# Patient Record
Sex: Female | Born: 1935 | Race: White | Hispanic: No | Smoking: Never smoker
Health system: Southern US, Community
[De-identification: ages and names within clinical notes are randomized; demographics above are authoritative.]

## PROBLEM LIST (undated history)

## (undated) ENCOUNTER — Emergency Department (HOSPITAL_COMMUNITY): Payer: Medicare Other

## (undated) DIAGNOSIS — R32 Unspecified urinary incontinence: Secondary | ICD-10-CM

## (undated) DIAGNOSIS — I639 Cerebral infarction, unspecified: Secondary | ICD-10-CM

## (undated) DIAGNOSIS — E039 Hypothyroidism, unspecified: Secondary | ICD-10-CM

## (undated) DIAGNOSIS — E785 Hyperlipidemia, unspecified: Secondary | ICD-10-CM

## (undated) HISTORY — PX: SKIN CANCER EXCISION: SHX779

## (undated) HISTORY — PX: ABDOMINAL HYSTERECTOMY: SHX81

## (undated) HISTORY — PX: BREAST BIOPSY: SHX20

## (undated) HISTORY — PX: TOTAL ABDOMINAL HYSTERECTOMY W/ BILATERAL SALPINGOOPHORECTOMY: SHX83

## (undated) HISTORY — PX: COLONOSCOPY: SHX174

## (undated) HISTORY — DX: Cerebral infarction, unspecified: I63.9

## (undated) HISTORY — PX: EYE SURGERY: SHX253

## (undated) HISTORY — PX: DILATION AND CURETTAGE OF UTERUS: SHX78

---

## 1962-11-29 HISTORY — PX: CHOLECYSTECTOMY: SHX55

## 2008-01-09 LAB — HM DEXA SCAN

## 2010-02-02 LAB — HM MAMMOGRAPHY

## 2010-07-31 ENCOUNTER — Ambulatory Visit: Payer: Self-pay | Admitting: Ophthalmology

## 2010-08-12 ENCOUNTER — Ambulatory Visit: Payer: Self-pay | Admitting: General Surgery

## 2010-12-15 ENCOUNTER — Ambulatory Visit: Payer: Self-pay | Admitting: Ophthalmology

## 2010-12-22 ENCOUNTER — Ambulatory Visit: Payer: Self-pay | Admitting: Ophthalmology

## 2011-01-19 ENCOUNTER — Ambulatory Visit: Payer: Self-pay | Admitting: Ophthalmology

## 2013-04-09 ENCOUNTER — Ambulatory Visit: Payer: Self-pay

## 2013-06-25 ENCOUNTER — Ambulatory Visit: Payer: Self-pay | Admitting: Neurosurgery

## 2015-03-14 ENCOUNTER — Other Ambulatory Visit: Payer: Self-pay | Admitting: Unknown Physician Specialty

## 2015-03-14 DIAGNOSIS — Z1231 Encounter for screening mammogram for malignant neoplasm of breast: Secondary | ICD-10-CM

## 2015-06-09 ENCOUNTER — Ambulatory Visit
Admission: RE | Admit: 2015-06-09 | Discharge: 2015-06-09 | Disposition: A | Discharge status: Home / Self Care | Payer: Medicare Other | Source: Ambulatory Visit | Attending: Unknown Physician Specialty | Admitting: Unknown Physician Specialty

## 2015-06-09 ENCOUNTER — Other Ambulatory Visit: Payer: Self-pay | Admitting: Unknown Physician Specialty

## 2015-06-09 DIAGNOSIS — R921 Mammographic calcification found on diagnostic imaging of breast: Secondary | ICD-10-CM | POA: Insufficient documentation

## 2015-06-09 DIAGNOSIS — Z1231 Encounter for screening mammogram for malignant neoplasm of breast: Secondary | ICD-10-CM

## 2015-06-09 DIAGNOSIS — R928 Other abnormal and inconclusive findings on diagnostic imaging of breast: Secondary | ICD-10-CM

## 2015-06-10 ENCOUNTER — Telehealth: Payer: Self-pay

## 2015-06-10 NOTE — Telephone Encounter (Signed)
Called and let patient know about additional imaging. I informed her that they would be calling her or that she could call them and make her appointment.

## 2015-06-10 NOTE — Telephone Encounter (Signed)
Called and asked patient if she was or had already gone for her repeat imaging. Patient stated that the girl did the first scan yesterday, had the patient wait, and then did the scans again. Patient stated that the girl told her everything was fine. She does not understand why she needs a repeat because she thinks the repeat was yesterday when she did the scans the second time. Patient stated if she needs to have something done she will, but just does not understand.

## 2015-06-10 NOTE — Telephone Encounter (Signed)
-----   Message from Valerie Roys, DO sent at 06/09/2015  4:22 PM EDT ----- Incomplete- can we please make sure she is going for her repeat imaging. It looks like it has already been ordered, just want to make sure it's scheduled.

## 2015-06-10 NOTE — Discharge Instructions (Signed)
Breast Self-Awareness  Breast self-awareness allows you to notice a breast problem early while it is still small. Do a breast self-exam:  · Every month, 5-7 days after your period (menstrual period).  · At the same time each month if you do not have periods anymore.  Look for any:  · Difference between your breasts (size, shape, or position).  · Change in breast shape or size.  · Fluid or blood coming from your nipples.  · Changes in your nipples (dimpling, nipple movement).  ·  Change in skin color or texture (redness, scaly areas).  Feel for:  · Lumps.  · Bumps.  · Dips.  · Any other changes.  HOW TO DO A BREAST SELF-EXAM  Look at your breasts and nipples.  1. Take off all your clothes above your waist.  2. Stand in front of a mirror in a room with good lighting.  3. Put your hands on your hips and push your hands downward.  Feel your breasts.   1. Lie flat on your back or stand in the shower or tub. If you are in the shower or tub, have wet, soapy hands.  2. Place your right arm above your head.  3. Place your left hand in the right underarm area.  4. Make small circles using the pads (not the fingertips) of your 3 middle fingers. Press lightly and then with medium and firm pressure.  5. Move your fingers a little lower and make the small circles at the 3 pressures (light, medium, and firm).  6. Continue moving your fingers lower and making circles until you reach the bottom of your breast.  7. Move your fingers one finger-width towards the center of the body.  8. Continue making the circles, this time moving upward until you reach the bottom of your neck.  9. Move your fingers one finger-width towards the center of your body.  10. Make circles downward when starting at the bottom of the neck. Make circles upward when starting at the bottom of the breast. Stop when you reach the middle of the chest.  11.  Repeat these steps on the other breast.  Write down what looks and feels normal for each breast. Also write  down any changes you notice.  GET HELP RIGHT AWAY IF:  · You see any changes in your breasts or nipples.  · You see skin changes.  · You have unusual discharge from your nipples.  · You feel a new lump.  · You feel unusually thick areas.  Document Released: 05/03/2008 Document Revised: 11/01/2012 Document Reviewed: 03/01/2012  [employer]® Patient Information ©2015 [employer]. This information is not intended to replace advice given to you by your health care provider. Make sure you discuss any questions you have with your health care provider.

## 2015-06-10 NOTE — Telephone Encounter (Signed)
It looks like they saw some calcifications in her R breast and needed to get additional imaging. They should be contacting her, otherwise she can call them and get an appointment.

## 2015-06-11 ENCOUNTER — Ambulatory Visit
Admission: RE | Admit: 2015-06-11 | Discharge: 2015-06-11 | Disposition: A | Discharge status: Home / Self Care | Payer: Medicare Other | Source: Ambulatory Visit | Attending: Unknown Physician Specialty | Admitting: Unknown Physician Specialty

## 2015-06-11 ENCOUNTER — Other Ambulatory Visit: Payer: Self-pay | Admitting: Unknown Physician Specialty

## 2015-06-11 ENCOUNTER — Encounter: Payer: Self-pay | Admitting: *Deleted

## 2015-06-11 ENCOUNTER — Telehealth: Payer: Self-pay | Admitting: Family Medicine

## 2015-06-11 ENCOUNTER — Ambulatory Visit: Payer: Medicare Other

## 2015-06-11 DIAGNOSIS — R928 Other abnormal and inconclusive findings on diagnostic imaging of breast: Secondary | ICD-10-CM | POA: Diagnosis present

## 2015-06-11 DIAGNOSIS — R921 Mammographic calcification found on diagnostic imaging of breast: Secondary | ICD-10-CM

## 2015-06-11 NOTE — Telephone Encounter (Signed)
Called to notify patient of her mammogram results. Left a voicemail and told her to return my call.  Appointment scheduled for evaluation  at [facility] for 06-17-15@ 11:15 arrive at 11:00 with Dr.Byrnette

## 2015-06-11 NOTE — Telephone Encounter (Signed)
Patient notified of her appointment.

## 2015-06-11 NOTE — Telephone Encounter (Signed)
Please let patient know that her mammogram looked suspicious. Will refer to Dr. Bary Castilla (please set up prior to calling patient) for evaluation and possible biopsy. Thanks!

## 2015-06-16 ENCOUNTER — Encounter: Payer: Self-pay | Admitting: General Surgery

## 2015-06-17 ENCOUNTER — Ambulatory Visit (INDEPENDENT_AMBULATORY_CARE_PROVIDER_SITE_OTHER): Payer: Medicare Other | Admitting: General Surgery

## 2015-06-17 ENCOUNTER — Encounter: Payer: Self-pay | Admitting: General Surgery

## 2015-06-17 VITALS — BP 140/82 | HR 70 | Resp 12 | Ht 60.0 in | Wt 198.0 lb

## 2015-06-17 DIAGNOSIS — R920 Mammographic microcalcification found on diagnostic imaging of breast: Secondary | ICD-10-CM

## 2015-06-17 NOTE — Progress Notes (Signed)
Patient ID: Sarah Freeman, female   DOB: June 26, 1931, 81 y.o.   MRN: [id number]  Chief Complaint  Patient presents with  . Follow-up    mammogram    HPI Sarah Freeman is a 81 y.o. female who presents for a breast evaluation. The most recent mammogram was done on 06/09/15 and added right breast views done on 06/11/15.  Patient does perform regular self breast checks but does not get regular mammograms done.  She states she had this mammogram done because she is still on premarin. She has not felt anything different in the breast. She was here in 2011 when she had a benign left breast biopsy. He found the stereotactic procedure somewhat traumatic, so much so that she's declined to have follow-up mammograms until recently. She agreed to this only because she has been making use of Premarin for an extended period of time for management of fairly significant vasomotor symptoms.  Her husband passed in 2012, just shy of their 59th anniversary.  HPI  No past medical history on file.  Past Surgical History  Procedure Laterality Date  . Cholecystectomy    . Dilation and curettage of uterus    . Eye surgery    . Breast biopsy Left 2000?    neg. dr. Dwyane Luo office  . Colonoscopy      Dr Nicolasa Ducking  . Abdominal hysterectomy      age 34    Family History  Problem Relation Age of Onset  . Cancer Father     prostate    Social History History  Substance Use Topics  . Smoking status: Never Smoker   . Smokeless tobacco: Never Used  . Alcohol Use: No    No Known Allergies  Current Outpatient Prescriptions  Medication Sig Dispense Refill  . aspirin 81 MG tablet Take 81 mg by mouth daily.    Marland Kitchen esomeprazole (NEXIUM) 20 MG capsule Take 20 mg by mouth as needed.    Marland Kitchen levothyroxine (SYNTHROID, LEVOTHROID) 50 MCG tablet Take by mouth.    Marland Kitchen PREMARIN 0.3 MG tablet      No current facility-administered medications for this visit.    Review of Systems Review of Systems  Constitutional:  Negative.   Respiratory: Negative.   Cardiovascular: Negative.     Blood pressure 140/82, pulse 70, resp. rate 12, height 5' (1.524 m), weight 198 lb (89.812 kg).  Physical Exam Physical Exam  Constitutional: She is oriented to person, place, and time. She appears well-developed and well-nourished.  HENT:  Mouth/Throat: Oropharynx is clear and moist.  Eyes: Conjunctivae are normal. No scleral icterus.  Neck: Neck supple.  Cardiovascular: Normal rate, regular rhythm and normal heart sounds.   No lower leg edema.  Pulmonary/Chest: Effort normal and breath sounds normal. Right breast exhibits no inverted nipple, no mass, no nipple discharge, no skin change and no tenderness. Left breast exhibits no inverted nipple, no mass, no nipple discharge, no skin change and no tenderness.  Left breast > right breast, no more than 10%.  Lymphadenopathy:    She has no cervical adenopathy.    She has no axillary adenopathy.  Neurological: She is alert and oriented to person, place, and time.  Skin: Skin is warm and dry.  Psychiatric: She has a normal mood and affect.    Data Reviewed Left breast biopsy completed 02/23/2010 for microcalcification showed benign breast tissue with fibrocystic changes. Mild ductal hyperplasia without atypia.  PCP notes of 02/14/2015 were reviewed.  CBC at that time  showed a hemoglobin 13.1 with an MCV of 96. Normal white blood cell count of 8200. Moderate elevation of serum cholesterol and triglycerides. Random blood sugar elevated at 113. Normal liver function studies. Potassium 4.6. Creatinine 0.97 with an estimated GFR 55. TSH was normal at 2.3.  Screening mammogram of 06/09/2015 and diagnostic mammogram of the right breast dated 06/11/2015 were reviewed. In the 5 years between imaging studies an 11 mm area of microcalcifications of been identified. BI-RADS-4. Stereotactic biopsy recommended.  Assessment    New microcalcifications in the right breast since 2011.     Plan    Options for management were reviewed: 1) proceed directly to biopsy versus 2) six-month follow-up exam. The risk of missed malignancy is small, and the likelihood that up staging is very low.         She wishes to repeat mammogram in 6 months.  The patient may continue her Premarin.   PCP:  Lily Lovings 06/17/2015, 8:48 PM

## 2015-06-17 NOTE — Patient Instructions (Signed)
Continue self breast exams. Call office for any new breast issues or concerns. 

## 2015-10-16 ENCOUNTER — Other Ambulatory Visit: Payer: Self-pay | Admitting: *Deleted

## 2015-10-16 DIAGNOSIS — R920 Mammographic microcalcification found on diagnostic imaging of breast: Secondary | ICD-10-CM

## 2015-10-30 ENCOUNTER — Encounter: Payer: Self-pay | Admitting: *Deleted

## 2015-12-15 ENCOUNTER — Other Ambulatory Visit: Payer: Self-pay | Admitting: General Surgery

## 2015-12-15 ENCOUNTER — Ambulatory Visit
Admission: RE | Admit: 2015-12-15 | Discharge: 2015-12-15 | Disposition: A | Discharge status: Home / Self Care | Payer: Medicare Other | Source: Ambulatory Visit | Attending: General Surgery | Admitting: General Surgery

## 2015-12-15 DIAGNOSIS — R920 Mammographic microcalcification found on diagnostic imaging of breast: Secondary | ICD-10-CM

## 2015-12-15 DIAGNOSIS — R921 Mammographic calcification found on diagnostic imaging of breast: Secondary | ICD-10-CM | POA: Insufficient documentation

## 2015-12-22 ENCOUNTER — Ambulatory Visit (INDEPENDENT_AMBULATORY_CARE_PROVIDER_SITE_OTHER): Payer: Medicare Other | Admitting: General Surgery

## 2015-12-22 ENCOUNTER — Encounter: Payer: Self-pay | Admitting: General Surgery

## 2015-12-22 VITALS — BP 134/68 | HR 72 | Resp 14 | Ht 60.0 in | Wt 199.0 lb

## 2015-12-22 DIAGNOSIS — R920 Mammographic microcalcification found on diagnostic imaging of breast: Secondary | ICD-10-CM | POA: Diagnosis not present

## 2015-12-22 NOTE — Progress Notes (Signed)
Patient ID: Sarah Freeman, female   DOB: 1931/07/22, 82 y.o.   MRN: [id number]  Chief Complaint  Patient presents with  . Follow-up    mammogram    HPI Sarah Freeman is a 82 y.o. female who presents for a breast evaluation. The most recent right breast mammogram was done on 12/15/15.  Patient does perform regular self breast checks and gets regular mammograms done.    I personally reviewed the patient's history.  HPI  No past medical history on file.  Past Surgical History  Procedure Laterality Date  . Dilation and curettage of uterus    . Eye surgery    . Colonoscopy      Dr Nicolasa Ducking  . Abdominal hysterectomy      age 78  . Breast biopsy Left 2000?    neg. dr. Dwyane Luo office  . Cholecystectomy  1964    Family History  Problem Relation Age of Onset  . Cancer Father     prostate    Social History Social History  Substance Use Topics  . Smoking status: Never Smoker   . Smokeless tobacco: Never Used  . Alcohol Use: No    No Known Allergies  Current Outpatient Prescriptions  Medication Sig Dispense Refill  . aspirin 81 MG tablet Take 81 mg by mouth daily.    Marland Kitchen esomeprazole (NEXIUM) 20 MG capsule Take 20 mg by mouth as needed.    Marland Kitchen levothyroxine (SYNTHROID, LEVOTHROID) 50 MCG tablet Take by mouth.    Marland Kitchen PREMARIN 0.3 MG tablet      No current facility-administered medications for this visit.    Review of Systems Review of Systems  Constitutional: Negative.   Respiratory: Negative.   Cardiovascular: Negative.     Blood pressure 134/68, pulse 72, resp. rate 14, height 5' (1.524 m), weight 199 lb (90.266 kg).  Physical Exam Physical Exam  Constitutional: She is oriented to person, place, and time. She appears well-developed and well-nourished.  Eyes: Conjunctivae are normal. No scleral icterus.  Neck: Neck supple.  Cardiovascular: Normal rate, regular rhythm and normal heart sounds.   Pulmonary/Chest: Effort normal and breath sounds normal. Right breast  exhibits no inverted nipple, no mass, no nipple discharge, no skin change and no tenderness. Left breast exhibits no inverted nipple, no mass, no nipple discharge, no skin change and no tenderness.  Lymphadenopathy:    She has no cervical adenopathy.    She has no axillary adenopathy.  Neurological: She is alert and oriented to person, place, and time.  Skin: Skin is warm and dry.    Data Reviewed Right breast diagnostic mammogram dated 12/15/2015 was reviewed. No interval change in the previously identified linear microcalcifications.Marland Kitchen BI-RADS-4.  Assessment    Stable mammogram, new microcalcifications from 5 years ago.  Ongoing estrogen therapy.    Plan    The patient declines biopsy at this time but is amenable to a 6 month follow-up mammogram.    The patient has been asked to return to the office in 6 months bilateral diagnotic mammogram.  PCP:  Julian Hy, This information has been scribed by Gaspar Cola CMA.    Robert Bellow 12/23/2015, 5:59 PM

## 2015-12-22 NOTE — Patient Instructions (Signed)
The patient has been asked to return to the office in 6 months bilateral diagnotic mammogram.

## 2016-01-29 DIAGNOSIS — M48061 Spinal stenosis, lumbar region without neurogenic claudication: Secondary | ICD-10-CM | POA: Insufficient documentation

## 2016-01-29 DIAGNOSIS — E7800 Pure hypercholesterolemia, unspecified: Secondary | ICD-10-CM | POA: Insufficient documentation

## 2016-01-29 DIAGNOSIS — N393 Stress incontinence (female) (male): Secondary | ICD-10-CM | POA: Insufficient documentation

## 2016-01-29 DIAGNOSIS — N951 Menopausal and female climacteric states: Secondary | ICD-10-CM | POA: Insufficient documentation

## 2016-01-29 DIAGNOSIS — K219 Gastro-esophageal reflux disease without esophagitis: Secondary | ICD-10-CM

## 2016-01-29 DIAGNOSIS — E039 Hypothyroidism, unspecified: Secondary | ICD-10-CM | POA: Insufficient documentation

## 2016-01-29 DIAGNOSIS — M8580 Other specified disorders of bone density and structure, unspecified site: Secondary | ICD-10-CM | POA: Insufficient documentation

## 2016-02-15 ENCOUNTER — Emergency Department: Payer: Medicare Other

## 2016-02-15 ENCOUNTER — Inpatient Hospital Stay
Admission: EM | Admit: 2016-02-15 | Discharge: 2016-02-17 | DRG: 065 | Disposition: A | Discharge status: Home Health | Payer: Medicare Other | Attending: Internal Medicine | Admitting: Internal Medicine

## 2016-02-15 DIAGNOSIS — Z7982 Long term (current) use of aspirin: Secondary | ICD-10-CM | POA: Diagnosis not present

## 2016-02-15 DIAGNOSIS — Z79899 Other long term (current) drug therapy: Secondary | ICD-10-CM | POA: Diagnosis not present

## 2016-02-15 DIAGNOSIS — R4781 Slurred speech: Secondary | ICD-10-CM | POA: Diagnosis present

## 2016-02-15 DIAGNOSIS — E785 Hyperlipidemia, unspecified: Secondary | ICD-10-CM | POA: Diagnosis present

## 2016-02-15 DIAGNOSIS — I7389 Other specified peripheral vascular diseases: Secondary | ICD-10-CM | POA: Diagnosis present

## 2016-02-15 DIAGNOSIS — I639 Cerebral infarction, unspecified: Principal | ICD-10-CM | POA: Diagnosis present

## 2016-02-15 DIAGNOSIS — Z8042 Family history of malignant neoplasm of prostate: Secondary | ICD-10-CM

## 2016-02-15 DIAGNOSIS — Z823 Family history of stroke: Secondary | ICD-10-CM | POA: Diagnosis not present

## 2016-02-15 DIAGNOSIS — G8194 Hemiplegia, unspecified affecting left nondominant side: Secondary | ICD-10-CM | POA: Diagnosis present

## 2016-02-15 DIAGNOSIS — E039 Hypothyroidism, unspecified: Secondary | ICD-10-CM | POA: Diagnosis present

## 2016-02-15 DIAGNOSIS — G459 Transient cerebral ischemic attack, unspecified: Secondary | ICD-10-CM

## 2016-02-15 DIAGNOSIS — Z79890 Hormone replacement therapy (postmenopausal): Secondary | ICD-10-CM | POA: Diagnosis not present

## 2016-02-15 DIAGNOSIS — R531 Weakness: Secondary | ICD-10-CM

## 2016-02-15 DIAGNOSIS — R739 Hyperglycemia, unspecified: Secondary | ICD-10-CM | POA: Diagnosis present

## 2016-02-15 DIAGNOSIS — M6289 Other specified disorders of muscle: Secondary | ICD-10-CM | POA: Diagnosis present

## 2016-02-15 DIAGNOSIS — I63311 Cerebral infarction due to thrombosis of right middle cerebral artery: Secondary | ICD-10-CM

## 2016-02-15 HISTORY — DX: Hypothyroidism, unspecified: E03.9

## 2016-02-15 LAB — CBC
HCT: 40.5 % (ref 35.0–47.0)
Hemoglobin: 13.5 g/dL (ref 12.0–16.0)
MCH: 32.2 pg (ref 26.0–34.0)
MCHC: 33.4 g/dL (ref 32.0–36.0)
MCV: 96.5 fL (ref 80.0–100.0)
Platelets: 289 10*3/uL (ref 150–440)
RBC: 4.19 MIL/uL (ref 3.80–5.20)
RDW: 12.5 % (ref 11.5–14.5)
WBC: 8.9 10*3/uL (ref 3.6–11.0)

## 2016-02-15 LAB — URINALYSIS COMPLETE WITH MICROSCOPIC (ARMC ONLY)
Bacteria, UA: NONE SEEN
Bilirubin Urine: NEGATIVE
Glucose, UA: NEGATIVE mg/dL
HGB URINE DIPSTICK: NEGATIVE
KETONES UR: NEGATIVE mg/dL
LEUKOCYTES UA: NEGATIVE
NITRITE: NEGATIVE
PH: 7 (ref 5.0–8.0)
PROTEIN: NEGATIVE mg/dL
Specific Gravity, Urine: 1.004 — ABNORMAL LOW (ref 1.005–1.030)

## 2016-02-15 LAB — APTT

## 2016-02-15 LAB — COMPREHENSIVE METABOLIC PANEL
ALBUMIN: 4.1 g/dL (ref 3.5–5.0)
ALT: 21 U/L (ref 14–54)
AST: 27 U/L (ref 15–41)
Alkaline Phosphatase: 38 U/L (ref 38–126)
Anion gap: 7 (ref 5–15)
BUN: 12 mg/dL (ref 6–20)
CHLORIDE: 103 mmol/L (ref 101–111)
CO2: 25 mmol/L (ref 22–32)
Calcium: 8.8 mg/dL — ABNORMAL LOW (ref 8.9–10.3)
Creatinine, Ser: 1.21 mg/dL — ABNORMAL HIGH (ref 0.44–1.00)
GFR calc Af Amer: 47 mL/min — ABNORMAL LOW (ref >60)
GFR, EST NON AFRICAN AMERICAN: 41 mL/min — AB (ref >60)
GLUCOSE: 167 mg/dL — AB (ref 65–99)
POTASSIUM: 3.7 mmol/L (ref 3.5–5.1)
Sodium: 135 mmol/L (ref 135–145)
Total Bilirubin: 0.6 mg/dL (ref 0.3–1.2)
Total Protein: 7.4 g/dL (ref 6.5–8.1)

## 2016-02-15 LAB — PROTIME-INR
INR: 1.04
Prothrombin Time: 13.8 seconds (ref 11.4–15.0)

## 2016-02-15 LAB — DIFFERENTIAL
BASOS ABS: 0.1 10*3/uL (ref 0–0.1)
BASOS PCT: 1 %
EOS ABS: 0.3 10*3/uL (ref 0–0.7)
Eosinophils Relative: 3 %
Lymphocytes Relative: 43 %
Lymphs Abs: 3.8 10*3/uL — ABNORMAL HIGH (ref 1.0–3.6)
Monocytes Absolute: 0.7 10*3/uL (ref 0.2–0.9)
Monocytes Relative: 8 %
NEUTROS PCT: 45 %
Neutro Abs: 4 10*3/uL (ref 1.4–6.5)

## 2016-02-15 LAB — TROPONIN I

## 2016-02-15 LAB — LIPID PANEL
CHOLESTEROL: 236 mg/dL — AB (ref 0–200)
HDL: 48 mg/dL (ref >40)
LDL Cholesterol: 150 mg/dL — ABNORMAL HIGH (ref 0–99)
Total CHOL/HDL Ratio: 4.9 RATIO
Triglycerides: 190 mg/dL — ABNORMAL HIGH (ref <150)
VLDL: 38 mg/dL (ref 0–40)

## 2016-02-15 MED ORDER — ACETAMINOPHEN 650 MG RE SUPP: 650.0000 mg | Freq: Four times a day (QID) | RECTAL | Status: DC | PRN

## 2016-02-15 MED ORDER — LEVOTHYROXINE SODIUM 50 MCG PO TABS
50.0000 ug | ORAL_TABLET | Freq: Every day | ORAL | Status: DC
Start: 2016-02-16 — End: 2016-02-17
  Administered 2016-02-16 – 2016-02-17 (×2): 50 ug via ORAL
  Filled 2016-02-15 (×2): qty 1

## 2016-02-15 MED ORDER — ROSUVASTATIN CALCIUM 10 MG PO TABS
10.0000 mg | ORAL_TABLET | Freq: Every day | ORAL | Status: DC
Start: 2016-02-16 — End: 2016-02-17
  Administered 2016-02-16 – 2016-02-17 (×2): 10 mg via ORAL
  Filled 2016-02-15 (×2): qty 1

## 2016-02-15 MED ORDER — ACETAMINOPHEN 325 MG PO TABS
650.0000 mg | ORAL_TABLET | Freq: Four times a day (QID) | ORAL | Status: DC | PRN
  Administered 2016-02-16: 650 mg via ORAL
  Filled 2016-02-15: qty 2

## 2016-02-15 MED ORDER — SENNOSIDES-DOCUSATE SODIUM 8.6-50 MG PO TABS: 1.0000 | ORAL_TABLET | Freq: Every evening | ORAL | Status: DC | PRN

## 2016-02-15 MED ORDER — ONDANSETRON HCL 4 MG/2ML IJ SOLN: 4.0000 mg | Freq: Four times a day (QID) | INTRAMUSCULAR | Status: DC | PRN

## 2016-02-15 MED ORDER — ASPIRIN 81 MG PO CHEW
324.0000 mg | CHEWABLE_TABLET | Freq: Once | ORAL | Status: AC
  Administered 2016-02-15: 324 mg via ORAL
  Filled 2016-02-15: qty 4

## 2016-02-15 MED ORDER — ENOXAPARIN SODIUM 40 MG/0.4ML ~~LOC~~ SOLN
40.0000 mg | SUBCUTANEOUS | Status: DC
  Administered 2016-02-15 – 2016-02-16 (×2): 40 mg via SUBCUTANEOUS
  Filled 2016-02-15 (×2): qty 0.4

## 2016-02-15 MED ORDER — ALUM & MAG HYDROXIDE-SIMETH 200-200-20 MG/5ML PO SUSP
30.0000 mL | Freq: Four times a day (QID) | ORAL | Status: DC | PRN
  Administered 2016-02-15: 30 mL via ORAL
  Filled 2016-02-15: qty 30

## 2016-02-15 MED ORDER — SODIUM CHLORIDE 0.9% FLUSH
3.0000 mL | Freq: Two times a day (BID) | INTRAVENOUS | Status: DC
  Administered 2016-02-15 – 2016-02-17 (×3): 3 mL via INTRAVENOUS

## 2016-02-15 MED ORDER — CLOPIDOGREL BISULFATE 75 MG PO TABS
75.0000 mg | ORAL_TABLET | Freq: Every day | ORAL | Status: DC
  Administered 2016-02-15 – 2016-02-17 (×3): 75 mg via ORAL
  Filled 2016-02-15 (×3): qty 1

## 2016-02-15 MED ORDER — ONDANSETRON HCL 4 MG PO TABS: 4.0000 mg | ORAL_TABLET | Freq: Four times a day (QID) | ORAL | Status: DC | PRN

## 2016-02-15 MED ORDER — PANTOPRAZOLE SODIUM 40 MG PO TBEC
40.0000 mg | DELAYED_RELEASE_TABLET | Freq: Every day | ORAL | Status: DC
  Administered 2016-02-16 – 2016-02-17 (×2): 40 mg via ORAL
  Filled 2016-02-15 (×2): qty 1

## 2016-02-15 NOTE — ED Notes (Signed)
Pt presents via EMS c/o left sided weakness after church at apx 1230. States unable to pick up left arm and left leg with numbness to left side. Upon presentation symptoms improved. Able to move all extremities. Reports weakness. Archie Balboa MD at bedside.

## 2016-02-15 NOTE — ED Notes (Signed)
Pt assisted to toilet. After voiding, pt stood with assist and stated she thought the gripper on the bottom of the sock were causing her difficulty moving her left leg. Pt noted with some difficulty ambulating to bed with assistance. Dr. Archie Balboa notified and at bedside.

## 2016-02-15 NOTE — H&P (Signed)
[name] NAME: Sarah Freeman    MR#:  [id number]  DATE OF BIRTH:  September 16, 1931  DATE OF ADMISSION:  02/15/2016  PRIMARY CARE PHYSICIAN: Kathrine Haddock, NP   REQUESTING/REFERRING PHYSICIAN: Dr  Archie Balboa CHIEF COMPLAINT:  Left leg weakness  HISTORY OF PRESENT ILLNESS:  Sarah Freeman  is a 82 y.o. female with a known history of Hypothyroidism who presents with above complaint. Patient was eating lunch this afternoon and approximately 12:30 she developed numbness in both of her arms and subsequently weakness in the left leg. She was unable to stand. Her family called EMS. When she was brought here her symptoms have subsided. After her CT scan was performed which showed no evidence of stroke she was going to use the restroom at the toilet in her room. When she was trying to get up from it what she could not move her left leg. She has not develop slurred speech or facial droop or any other weakness. Her symptoms have subsequently subsided. She takes a baby aspirin on a daily basis.  PAST MEDICAL HISTORY:   Past Medical History  Diagnosis Date  . Hypothyroid     PAST SURGICAL HISTORY:   Past Surgical History  Procedure Laterality Date  . Dilation and curettage of uterus    . Eye surgery    . Colonoscopy      Dr Nicolasa Ducking  . Abdominal hysterectomy      age 78  . Breast biopsy Left 2000?    neg. dr. Dwyane Luo office  . Cholecystectomy  1964    SOCIAL HISTORY:   Social History  Substance Use Topics  . Smoking status: Never Smoker   . Smokeless tobacco: Never Used  . Alcohol Use: No    FAMILY HISTORY:   Family History  Problem Relation Age of Onset  . Cancer Father     prostate  . Stroke Father     DRUG ALLERGIES:  No Known Allergies   REVIEW OF SYSTEMS:  CONSTITUTIONAL: No fever, fatigue or weakness.  EYES: No blurred or double vision.  EARS, NOSE, AND THROAT: No tinnitus or ear pain.  RESPIRATORY: No cough,  shortness of breath, wheezing or hemoptysis.  CARDIOVASCULAR: No chest pain, orthopnea, edema.  GASTROINTESTINAL: No nausea, vomiting, diarrhea or abdominal pain.  GENITOURINARY: No dysuria, hematuria.  ENDOCRINE: No polyuria, nocturia,  HEMATOLOGY: No anemia, easy bruising or bleeding SKIN: No rash or lesion. MUSCULOSKELETAL: No joint pain or arthritis.   NEUROLOGIC: No tingling, numbness, Positive for left leg weakness.  PSYCHIATRY: No anxiety or depression.   MEDICATIONS AT HOME:   Prior to Admission medications   Medication Sig Start Date End Date Taking? Authorizing Provider  aspirin 81 MG tablet Take 81 mg by mouth daily.    Historical Provider, MD  esomeprazole (NEXIUM) 20 MG capsule Take 20 mg by mouth as needed.    Historical Provider, MD  levothyroxine (SYNTHROID, LEVOTHROID) 50 MCG tablet Take 50 mcg by mouth daily before breakfast.     Historical Provider, MD  PREMARIN 0.3 MG tablet Take 0.3 mg by mouth daily.  03/21/15   Historical Provider, MD      VITAL SIGNS:  Blood pressure 106/58, pulse 93, temperature 98.4 F (36.9 C), temperature source Oral, resp. rate 21, height 5' (1.524 m), weight 83.915 kg (185 lb), SpO2 97 %.  PHYSICAL EXAMINATION:  GENERAL:  82 y.o.-year-old patient lying in the bed with no acute distress.  EYES: Pupils equal,  round, reactive to light and accommodation. No scleral icterus. Extraocular muscles intact.  HEENT: Head atraumatic, normocephalic. Oropharynx and nasopharynx clear.  NECK:  Supple, no jugular venous distention. No thyroid enlargement, no tenderness.  LUNGS: Normal breath sounds bilaterally, no wheezing, rales,rhonchi or crepitation. No use of accessory muscles of respiration.  CARDIOVASCULAR: S1, S2 normal. No murmurs, rubs, or gallops.  ABDOMEN: Soft, nontender, nondistended. Bowel sounds present. No organomegaly or mass.  EXTREMITIES: No pedal edema, cyanosis, or clubbing.  NEUROLOGIC: Cranial nerves II through XII are grossly  intact. No focal deficits. PSYCHIATRIC: The patient is alert and oriented x 3.  SKIN: No obvious rash, lesion, or ulcer.   LABORATORY PANEL:   CBC  Recent Labs Lab 02/15/16 1436  WBC 8.9  HGB 13.5  HCT 40.5  PLT 289   ------------------------------------------------------------------------------------------------------------------  Chemistries   Recent Labs Lab 02/15/16 1436  NA 135  K 3.7  CL 103  CO2 25  GLUCOSE 167*  BUN 12  CREATININE 1.21*  CALCIUM 8.8*  AST 27  ALT 21  ALKPHOS 38  BILITOT 0.6   ------------------------------------------------------------------------------------------------------------------  Cardiac Enzymes  Recent Labs Lab 02/15/16 1436  TROPONINI <0.03   ------------------------------------------------------------------------------------------------------------------  RADIOLOGY:  Ct Head Wo Contrast  02/15/2016  CLINICAL DATA:  Left-sided weakness for approximately 3 hours. EXAM: CT HEAD WITHOUT CONTRAST TECHNIQUE: Contiguous axial images were obtained from the base of the skull through the vertex without intravenous contrast. COMPARISON:  Brain MRI July 31, 2010 FINDINGS: Mild diffuse atrophy is stable. There is no intracranial mass hemorrhage, extra-axial fluid collection, or midline shift. There is patchy small vessel disease in the centra semiovale bilaterally. Elsewhere gray-white compartments appear normal. No acute infarct is evident. The bony calvarium appears intact. The visualized mastoid air cells are clear. Visualized orbits appear symmetric bilaterally. IMPRESSION: Stable atrophy with mild periventricular small vessel disease. No acute infarct evident. No hemorrhage or mass effect. Electronically Signed   By: Lowella Grip III M.D.   On: 02/15/2016 15:42    EKG:   Sinus tachycardia heart rate of 104 no ST elevation or depression. There are Q waves in the anterior leads.  IMPRESSION AND PLAN:    82 year old female  with a history of hypothyroidism who presents with TIA/CVA.  1. Acute CVA: Patient will be admitted and evaluated for stroke. Order MRI, carotid Doppler and 2-D echocardiogram. Change aspirin to Plavix. Check lipid panel and add Crestor. Side effects including muscle pain and weakness were discussed with the patient. She accepts risk. Neuro checks every 4 hours. PT and OT consult. Neurology consultation.   2. Hypothyroidism: Continue Synthroid.  3. Hyperglycemia: Check hemoglobin A1c.    All the records are reviewed and case discussed with ED provider. Management plans discussed with the patient and she is in agreement.  CODE STATUS: FULL  TOTAL TIME TAKING CARE OF THIS PATIENT: 50 minutes.    Juancarlos Crescenzo M.D on 02/15/2016 at 5:11 PM  Between 7am to 6pm - Pager - [phone] After 6pm go to [email or web address] - password [email or web address] [facility]  Office  [phone]  CC: Primary care physician; Kathrine Haddock, NP

## 2016-02-15 NOTE — ED Provider Notes (Signed)
[facility] Emergency Department Provider Note   ____________________________________________  Time seen: On EMS arrival  I have reviewed the triage vital signs and the nursing notes.   HISTORY  Chief Complaint Extremity Weakness   History limited by: Not Limited   HPI Sarah Freeman is a 82 y.o. female who presents to the emergency department today with concerns for left-sided weakness. The patient states that she had felt well this morning. She got home from church. She was sitting at the lunch when she felt weak in her left upper and lower extremities. This started roughly 2 hours prior to arrival. She states that she also had some associated numbness of her upper extremity. She denies any concurrent headache. She denies similar symptoms in the past. At the time of my examination the patient states that her symptoms have improved. She feels like she is able to move her left extremities packed with her normal level.     Past Medical History  Diagnosis Date  . Hypothyroid     Patient Active Problem List   Diagnosis Date Noted  . Osteopenia 01/29/2016  . Menopausal state 01/29/2016  . Hypothyroidism 01/29/2016  . GERD (gastroesophageal reflux disease) 01/29/2016  . Hypercholesterolemia 01/29/2016  . Lumbar spinal stenosis 01/29/2016  . Stress incontinence 01/29/2016  . Breast microcalcification, mammographic 06/17/2015    Past Surgical History  Procedure Laterality Date  . Dilation and curettage of uterus    . Eye surgery    . Colonoscopy      Dr Nicolasa Ducking  . Abdominal hysterectomy      age 12  . Breast biopsy Left 2000?    neg. dr. Dwyane Luo office  . Cholecystectomy  1964    Current Outpatient Rx  Name  Route  Sig  Dispense  Refill  . aspirin 81 MG tablet   Oral   Take 81 mg by mouth daily.         Marland Kitchen esomeprazole (NEXIUM) 20 MG capsule   Oral   Take 20 mg by mouth as needed.         Marland Kitchen levothyroxine (SYNTHROID, LEVOTHROID) 50  MCG tablet   Oral   Take 50 mcg by mouth daily before breakfast.          . PREMARIN 0.3 MG tablet   Oral   Take 0.3 mg by mouth daily.            Dispense as written.     Allergies Review of patient's allergies indicates no known allergies.  Family History  Problem Relation Age of Onset  . Cancer Father     prostate  . Stroke Father     Social History Social History  Substance Use Topics  . Smoking status: Never Smoker   . Smokeless tobacco: Never Used  . Alcohol Use: No    Review of Systems  Constitutional: Negative for fever. Cardiovascular: Negative for chest pain. Respiratory: Negative for shortness of breath. Gastrointestinal: Negative for abdominal pain, vomiting and diarrhea. Neurological: Negative for headaches. Positive for left sided weakness.  10-point ROS otherwise negative.  ____________________________________________   PHYSICAL EXAM:  VITAL SIGNS: ED Triage Vitals  Enc Vitals Group     BP 02/15/16 1431 149/86 mmHg     Pulse Rate 02/15/16 1431 107     Resp 02/15/16 1431 18     Temp 02/15/16 1431 98.4 F (36.9 C)     Temp Source 02/15/16 1431 Oral     SpO2 02/15/16 1431 99 %  Weight 02/15/16 1431 185 lb (83.915 kg)     Height 02/15/16 1431 5' (1.524 m)   Constitutional: Alert and oriented. Well appearing and in no distress. Eyes: Conjunctivae are normal. PERRL. Normal extraocular movements. ENT   Head: Normocephalic and atraumatic.   Nose: No congestion/rhinnorhea.   Mouth/Throat: Mucous membranes are moist.   Neck: No stridor. Hematological/Lymphatic/Immunilogical: No cervical lymphadenopathy. Cardiovascular: Normal rate, regular rhythm.  No murmurs, rubs, or gallops. Respiratory: Normal respiratory effort without tachypnea nor retractions. Breath sounds are clear and equal bilaterally. No wheezes/rales/rhonchi. Gastrointestinal: Soft and nontender. No distention. There is no CVA tenderness. Genitourinary:  Deferred Musculoskeletal: Normal range of motion in all extremities. No joint effusions.  No lower extremity tenderness nor edema. Neurologic:  Normal speech and language. Face symmetric. Tongue midline. EOMI. PERRL. Strength 5/5 in upper and lower extremities. No drift. Sensation intact. No gross focal neurologic deficits are appreciated.  Skin:  Skin is warm, dry and intact. No rash noted. Psychiatric: Mood and affect are normal. Speech and behavior are normal. Patient exhibits appropriate insight and judgment.  ____________________________________________    LABS (pertinent positives/negatives)  Labs Reviewed  APTT - Abnormal; Notable for the following:    aPTT <24 (*)    All other components within normal limits  DIFFERENTIAL - Abnormal; Notable for the following:    Lymphs Abs 3.8 (*)    All other components within normal limits  COMPREHENSIVE METABOLIC PANEL - Abnormal; Notable for the following:    Glucose, Bld 167 (*)    Creatinine, Ser 1.21 (*)    Calcium 8.8 (*)    GFR calc non Af Amer 41 (*)    GFR calc Af Amer 47 (*)    All other components within normal limits  URINALYSIS COMPLETEWITH MICROSCOPIC ([facility] ONLY) - Abnormal; Notable for the following:    Color, Urine STRAW (*)    APPearance CLEAR (*)    Specific Gravity, Urine 1.004 (*)    Squamous Epithelial / LPF 0-5 (*)    All other components within normal limits  PROTIME-INR  CBC  TROPONIN I  I-STAT CHEM 8, ED     ____________________________________________   EKG  I, Nance Pear, attending physician, personally viewed and interpreted this EKG  EKG Time: 1430 Rate: 104 Rhythm: sinus tachycardia Axis: left axis deviation Intervals: qtc 458 QRS: narrow ST changes: no st elevation Impression: abnormal ekg ____________________________________________    RADIOLOGY  CT head  IMPRESSION: Stable atrophy with mild periventricular small vessel disease. No acute infarct evident. No hemorrhage or  mass effect.  ____________________________________________   PROCEDURES  Procedure(s) performed: None  Critical Care performed: No  ____________________________________________   INITIAL IMPRESSION / ASSESSMENT AND PLAN / ED COURSE  Pertinent labs & imaging results that were available during my care of the patient were reviewed by me and considered in my medical decision making (see chart for details).  Patient presented to the emergency department today after episode of left-sided weakness. Patient states his symptoms had resolved time she arrived to the emergency department. My exam patient in a stroke scale of 0. CT head without concerning findings. Blood work without concerning findings. Will plan on admission to the hospital service for further TIA workup.  ____________________________________________   FINAL CLINICAL IMPRESSION(S) / ED DIAGNOSES  Final diagnoses:  Left-sided weakness  Transient cerebral ischemia, unspecified transient cerebral ischemia type     Nance Pear, MD 02/15/16 1715

## 2016-02-15 NOTE — ED Notes (Signed)
Report given to [facility]. ED tech to transport.

## 2016-02-15 NOTE — ED Notes (Signed)
RN unavailable on floor to take report at this time. Will call back to receive report.

## 2016-02-16 ENCOUNTER — Inpatient Hospital Stay
Admit: 2016-02-16 | Discharge: 2016-02-16 | Disposition: A | Discharge status: Home / Self Care | Payer: Medicare Other | Attending: Internal Medicine | Admitting: Internal Medicine

## 2016-02-16 ENCOUNTER — Inpatient Hospital Stay: Payer: Medicare Other

## 2016-02-16 ENCOUNTER — Telehealth: Payer: Self-pay

## 2016-02-16 DIAGNOSIS — I639 Cerebral infarction, unspecified: Principal | ICD-10-CM

## 2016-02-16 LAB — BASIC METABOLIC PANEL
Anion gap: 3 — ABNORMAL LOW (ref 5–15)
BUN: 13 mg/dL (ref 6–20)
CALCIUM: 8.1 mg/dL — AB (ref 8.9–10.3)
CHLORIDE: 109 mmol/L (ref 101–111)
CO2: 24 mmol/L (ref 22–32)
CREATININE: 0.95 mg/dL (ref 0.44–1.00)
GFR, EST NON AFRICAN AMERICAN: 54 mL/min — AB (ref >60)
Glucose, Bld: 105 mg/dL — ABNORMAL HIGH (ref 65–99)
Potassium: 4 mmol/L (ref 3.5–5.1)
SODIUM: 136 mmol/L (ref 135–145)

## 2016-02-16 LAB — ECHOCARDIOGRAM LIMITED
HEIGHTINCHES: 60 in
WEIGHTICAEL: 3177.6 [oz_av]

## 2016-02-16 LAB — HEMOGLOBIN A1C: HEMOGLOBIN A1C: 5.8 % (ref 4.0–6.0)

## 2016-02-16 MED ORDER — ZOLPIDEM TARTRATE 5 MG PO TABS
5.0000 mg | ORAL_TABLET | Freq: Once | ORAL | Status: AC
  Administered 2016-02-16: 5 mg via ORAL
  Filled 2016-02-16: qty 1

## 2016-02-16 NOTE — Evaluation (Signed)
Occupational Therapy Evaluation Patient Details Name: Sarah Freeman MRN: [id number] DOB: 05/01/1931 Today's Date: 02/16/2016    History of Present Illness Sarah Freeman is a 81 y.o. female with a known history of hypothyroidism who presents with complaint of L sided weakness. Patient was eating lunch this afternoon and approximately 12:30 she developed numbness in both of her arms and subsequently weakness in the left leg. She was unable to stand. Her family called EMS. When she was brought here her symptoms have subsided. After her CT scan was performed which showed no evidence of stroke she was going to use the restroom at the toilet in her room. When she was trying to get up from it what she could not move her left leg. She has not develop slurred speech or facial droop or any other weakness. Her symptoms have subsequently subsided. She takes a baby aspirin on a daily basis. During PT evaluation pt is complaining of some LLE weakness in standing. Reports full resolution of LUE weakness. Pt denies numbness/tingling throughout UE/LE. No falls reported in the last 12 months. Pt has previously been very independent with ADLs/IADLs at home. Pt drives and even using riding mower to cut her yard.   Clinical Impression   Pt seen for OT evaluation with son, daughter in law and grand daughter present.  Pt is eager to return to PLOF which was independent, driving and mowing several acres of grass with riding lawn mower.  She is able to complete ADLs with supervision and used FWW to go to the bathroom and presented with high level balance issues and almost had LOB when turning and reaching at same time to demonstrate where her window is in her bathroom where she holds on to pull herself up off of toilet.  Pt has intact sensation and proprioception but mild decrease in finger to nose on L side only and very mild fine motor coordination deficits but strength has improved back to baseline for hand grasp and LUE  4/5 in shoulder and elbow.  Rec continued OT to address safety with balance during ADLs, functional mobiltiy with FWW and OT HH to assess safety and balance during ADLs at home.  Pt and family asking if she is cleared to drive again which was deferred to MD.  Concern is that she drifts to the left slightly when ambulating longer distances with PT and decreased dynamic balance during toileting.        Follow Up Recommendations  Home health OT    Equipment Recommendations  Tub/shower bench    Recommendations for Other Services       Precautions / Restrictions Precautions Precautions: Fall Restrictions Weight Bearing Restrictions: No      Mobility Bed Mobility                  Transfers                      Balance                                            ADL Overall ADL's : Needs assistance/impaired                                       General ADL Comments: Pt is able  to ambulate with FWW to bathroom and sit on toilet and complete hygiene with supervision but presents with high level balance issues and almost had LOB one time when turning and reaching at same time.  She is able to complete LB dressing in sitting and simulate pants over hips with supervision only.       Vision     Perception     Praxis      Pertinent Vitals/Pain Pain Assessment: No/denies pain     Hand Dominance Right   Extremity/Trunk Assessment Upper Extremity Assessment LUE Deficits / Details: Strength and sensation appear grossly WFL and symmetrical RUE and LUE. Rapid alternating movemets intact. Pt has very mild dysmetria with L finger to nose when compared to R side. No facial droop.  Intact sensation and proprioception.   Lower Extremity Assessment Lower Extremity Assessment: Defer to PT evaluation       Communication Communication Communication: No difficulties   Cognition Arousal/Alertness: Awake/alert Behavior During Therapy: WFL  for tasks assessed/performed Overall Cognitive Status: Within Functional Limits for tasks assessed                     General Comments       Exercises       Shoulder Instructions      Home Living Family/patient expects to be discharged to:: Private residence Living Arrangements: Alone Available Help at Discharge: Family;Available PRN/intermittently Type of Home: House (basement where she has a deep freezer) Home Access: Stairs to enter Entrance Stairs-Number of Steps: 2 Entrance Stairs-Rails: None Home Layout: Laundry or work area in basement;Able to live on main level with bedroom/bathroom     Bathroom Shower/Tub: Tub/shower unit (pt sits in tub when bathing--rec she use a transfer tub bench to increase safety) Shower/tub characteristics: Architectural technologist: Standard Bathroom Accessibility: Yes How Accessible: Accessible via walker Home Equipment: [place] - 2 wheels;Cane - single point;Bedside commode;Shower seat;Tub bench;Grab bars - tub/shower          Prior Functioning/Environment Level of Independence: Independent        Comments: Drives, full community ambulator without assistive device. Mows her yard with riding mower    OT Diagnosis: Generalized weakness (weakness mild on LUE and high level balance issues)   OT Problem List: Impaired balance (sitting and/or standing);Decreased strength   OT Treatment/Interventions: Self-care/ADL training;Balance training;Patient/family education;Therapeutic exercise    OT Goals(Current goals can be found in the care plan section) Acute Rehab OT Goals Patient Stated Goal: Return to prior function at home OT Goal Formulation: With patient/family Time For Goal Achievement: 03/01/16 Potential to Achieve Goals: Good  OT Frequency: Min 1X/week   Barriers to D/C:    lives at home alone       Co-evaluation              End of Session Equipment Utilized During Treatment: Gait belt  Activity Tolerance:  Patient tolerated treatment well Patient left: in chair;with call bell/phone within reach;with family/visitor present   Time: 1500-1600 OT Time Calculation (min): 60 min Charges:  OT General Charges $OT Visit: 1 Procedure OT Evaluation $OT Eval Moderate Complexity: 1 Procedure OT Treatments $Self Care/Home Management : 23-37 mins $Therapeutic Activity: 8-22 mins G-Codes:    Tynasia Mccaul 03/13/2016, 4:27 PM   Chrys Racer, OTR/L ascom [phone]

## 2016-02-16 NOTE — Telephone Encounter (Signed)
Called and spoke to patient's son. He asked what medications his mother is supposed to be taking.

## 2016-02-16 NOTE — Consult Note (Addendum)
Referring Physician: Vianne Bulls    Chief Complaint: Left leg weakness  HPI: Sarah Freeman is an 82 y.o. female who reports that she was at home eating lunch and noted that her left arm and leg became heavy, with a feeling of tingling.  She was unable to lift her arm and had difficulty fully extending her leg as well.  EMS was called while en route she had improvement in her symptoms but did not return to baseline.  She continues to have left sided weakness.   Initial NIHSS of 1  Date last known well: 02/15/2016 Time last known well: Time: 12:30 tPA Given: No: Improvement in symptoms  MRankin: 0  Past Medical History  Diagnosis Date  . Hypothyroid     Past Surgical History  Procedure Laterality Date  . Dilation and curettage of uterus    . Eye surgery    . Colonoscopy      Dr Nicolasa Ducking  . Abdominal hysterectomy      age 59  . Breast biopsy Left 2000?    neg. dr. Dwyane Luo office  . Cholecystectomy  1964    Family History  Problem Relation Age of Onset  . Cancer Father     prostate  . Stroke Father    Social History:  reports that she has never smoked. She has never used smokeless tobacco. She reports that she does not drink alcohol or use illicit drugs.  Allergies: No Known Allergies  Medications:  I have reviewed the patient's current medications. Prior to Admission:  Prescriptions prior to admission  Medication Sig Dispense Refill Last Dose  . aspirin 81 MG tablet Take 81 mg by mouth at bedtime.    02/14/2016 at Unknown time  . levothyroxine (SYNTHROID, LEVOTHROID) 50 MCG tablet Take 50 mcg by mouth at bedtime.    02/14/2016 at Unknown time   Scheduled: . clopidogrel  75 mg Oral Daily  . enoxaparin (LOVENOX) injection  40 mg Subcutaneous Q24H  . levothyroxine  50 mcg Oral QAC breakfast  . pantoprazole  40 mg Oral Daily  . rosuvastatin  10 mg Oral Daily  . sodium chloride flush  3 mL Intravenous Q12H    ROS: History obtained from the patient  General ROS:  negative for - chills, fatigue, fever, night sweats, weight gain or weight loss Psychological ROS: negative for - behavioral disorder, hallucinations, memory difficulties, mood swings or suicidal ideation Ophthalmic ROS: negative for - blurry vision, double vision, eye pain or loss of vision ENT ROS: negative for - epistaxis, nasal discharge, oral lesions, sore throat, tinnitus or vertigo Allergy and Immunology ROS: negative for - hives or itchy/watery eyes Hematological and Lymphatic ROS: negative for - bleeding problems, bruising or swollen lymph nodes Endocrine ROS: negative for - galactorrhea, hair pattern changes, polydipsia/polyuria or temperature intolerance Respiratory ROS: negative for - cough, hemoptysis, shortness of breath or wheezing Cardiovascular ROS: negative for - chest pain, dyspnea on exertion, edema or irregular heartbeat Gastrointestinal ROS: negative for - abdominal pain, diarrhea, hematemesis, nausea/vomiting or stool incontinence Genito-Urinary ROS: negative for - dysuria, hematuria, incontinence or urinary frequency/urgency Musculoskeletal ROS: negative for - joint swelling or muscular weakness Neurological ROS: as noted in HPI Dermatological ROS: negative for rash and skin lesion changes  Physical Examination: Blood pressure 121/62, pulse 83, temperature 97.8 F (36.6 C), temperature source Oral, resp. rate 18, height 5' (1.524 m), weight 90.084 kg (198 lb 9.6 oz), SpO2 98 %.  HEENT-  Normocephalic, no lesions, without obvious abnormality.  Normal  external eye and conjunctiva.  Normal TM's bilaterally.  Normal auditory canals and external ears. Normal external nose, mucus membranes and septum.  Normal pharynx. Cardiovascular- S1, S2 normal, pulses palpable throughout   Lungs- chest clear, no wheezing, rales, normal symmetric air entry Abdomen- soft, non-tender; bowel sounds normal; no masses,  no organomegaly Extremities- no edema Lymph-no adenopathy  palpable Musculoskeletal-no joint tenderness, deformity or swelling Skin-warm and dry, no hyperpigmentation, vitiligo, or suspicious lesions  Neurological Examination Mental Status: Alert, oriented, thought content appropriate.  Speech fluent without evidence of aphasia.  Able to follow 3 step commands without difficulty. Cranial Nerves: II: Discs flat bilaterally; Visual fields grossly normal, pupils equal, round, reactive to light and accommodation III,IV, VI: ptosis not present, extra-ocular motions intact bilaterally V,VII: smile symmetric, facial light touch sensation normal bilaterally VIII: hearing normal bilaterally IX,X: gag reflex present XI: bilateral shoulder shrug XII: midline tongue extension Motor: Right : Upper extremity   5/5    Left:     Upper extremity   4/5  Lower extremity   5/5     Lower extremity   5-/5 Tone and bulk:normal tone throughout; no atrophy noted Sensory: Pinprick and light touch intact throughout, bilaterally Deep Tendon Reflexes: 2+ and symmetric with absent AJ's bilaterally Plantars: Right: downgoing   Left: downgoing Cerebellar: Normal finger-to-nose and normal heel-to-shin testing bilaterally Gait: not tested due to safety concerns   Laboratory Studies:  Basic Metabolic Panel:  Recent Labs Lab 02/15/16 1436 02/16/16 0517  NA 135 136  K 3.7 4.0  CL 103 109  CO2 25 24  GLUCOSE 167* 105*  BUN 12 13  CREATININE 1.21* 0.95  CALCIUM 8.8* 8.1*    Liver Function Tests:  Recent Labs Lab 02/15/16 1436  AST 27  ALT 21  ALKPHOS 38  BILITOT 0.6  PROT 7.4  ALBUMIN 4.1   No results for input(s): LIPASE, AMYLASE in the last 168 hours. No results for input(s): AMMONIA in the last 168 hours.  CBC:  Recent Labs Lab 02/15/16 1436  WBC 8.9  NEUTROABS 4.0  HGB 13.5  HCT 40.5  MCV 96.5  PLT 289    Cardiac Enzymes:  Recent Labs Lab 02/15/16 1436  TROPONINI <0.03    BNP: Invalid input(s): POCBNP  CBG: No results for  input(s): GLUCAP in the last 168 hours.  Microbiology: No results found for this or any previous visit.  Coagulation Studies:  Recent Labs  02/15/16 1436  LABPROT 13.8  INR 1.04    Urinalysis:  Recent Labs Lab 02/15/16 1645  COLORURINE STRAW*  LABSPEC 1.004*  PHURINE 7.0  GLUCOSEU NEGATIVE  HGBUR NEGATIVE  BILIRUBINUR NEGATIVE  KETONESUR NEGATIVE  PROTEINUR NEGATIVE  NITRITE NEGATIVE  LEUKOCYTESUR NEGATIVE    Lipid Panel:    Component Value Date/Time   CHOL 236* 02/15/2016 1436   TRIG 190* 02/15/2016 1436   HDL 48 02/15/2016 1436   CHOLHDL 4.9 02/15/2016 1436   VLDL 38 02/15/2016 1436   LDLCALC 150* 02/15/2016 1436    HgbA1C:  Lab Results  Component Value Date   HGBA1C 5.8 02/15/2016    Urine Drug Screen:  No results found for: LABOPIA, COCAINSCRNUR, LABBENZ, AMPHETMU, THCU, LABBARB  Alcohol Level: No results for input(s): ETH in the last 168 hours.  Other results: EKG: sinus tachycardia at 104 bpm, LAD.  Imaging: Ct Head Wo Contrast  02/15/2016  CLINICAL DATA:  Left-sided weakness for approximately 3 hours. EXAM: CT HEAD WITHOUT CONTRAST TECHNIQUE: Contiguous axial images were obtained from the  base of the skull through the vertex without intravenous contrast. COMPARISON:  Brain MRI July 31, 2010 FINDINGS: Mild diffuse atrophy is stable. There is no intracranial mass hemorrhage, extra-axial fluid collection, or midline shift. There is patchy small vessel disease in the centra semiovale bilaterally. Elsewhere gray-white compartments appear normal. No acute infarct is evident. The bony calvarium appears intact. The visualized mastoid air cells are clear. Visualized orbits appear symmetric bilaterally. IMPRESSION: Stable atrophy with mild periventricular small vessel disease. No acute infarct evident. No hemorrhage or mass effect. Electronically Signed   By: Lowella Grip III M.D.   On: 02/15/2016 15:42   Mr Brain Wo Contrast  02/16/2016  CLINICAL DATA:   Numbness of both arms and weakness of the left leg beginning yesterday acutely. EXAM: MRI HEAD WITHOUT CONTRAST TECHNIQUE: Multiplanar, multiecho pulse sequences of the brain and surrounding structures were obtained without intravenous contrast. COMPARISON:  Head CT 02/15/2016.  MRI 07/31/2010. FINDINGS: There are numerous punctate acute infarctions affecting the right hemisphere in a watershed pattern with involvement of the cortical and subcortical brain, the deep white matter and body of the caudate nucleus. No large confluent infarction. No acute infarction in the posterior fossa or affecting the left hemisphere. The brainstem and cerebellum appear normal. Elsewhere the cerebral hemispheres show mild changes of chronic small vessel disease within the deep white matter. No mass lesion, hemorrhage, hydrocephalus or extra-axial collection. No pituitary mass. No inflammatory sinus disease. Small amount of fluid in the mastoid air cells on the right. Major vessels at the base of the brain show flow. IMPRESSION: Numerous tiny watershed distribution infarctions affecting the right hemisphere in the cortical and subcortical white matter, deep white matter and body of the caudate. No large confluent infarction. No evidence of mass effect or hemorrhage. Electronically Signed   By: Nelson Chimes M.D.   On: 02/16/2016 11:36   US Carotid Bilateral  02/16/2016  CLINICAL DATA:  CVA. EXAM: BILATERAL CAROTID DUPLEX ULTRASOUND TECHNIQUE: Pearline Cables scale imaging, color Doppler and duplex ultrasound were performed of bilateral carotid and vertebral arteries in the neck. COMPARISON:  CT 02/15/2016. FINDINGS: Criteria: Quantification of carotid stenosis is based on velocity parameters that correlate the residual internal carotid diameter with NASCET-based stenosis levels, using the diameter of the distal internal carotid lumen as the denominator for stenosis measurement. The following velocity measurements were obtained: RIGHT ICA:   141/29 cm/sec CCA:  [id number] cm/sec SYSTOLIC ICA/CCA RATIO:  1.3 DIASTOLIC ICA/CCA RATIO:  1.7 ECA:  128 cm/sec LEFT ICA:  147/30 cm/sec CCA:  [id number] cm/sec SYSTOLIC ICA/CCA RATIO:  1.3 DIASTOLIC ICA/CCA RATIO:  1.8 ECA:  129 cm/sec RIGHT CAROTID ARTERY: Mild right carotid bifurcation plaque. No flow limiting stenosis. RIGHT VERTEBRAL ARTERY:  Patent with antegrade flow. LEFT CAROTID ARTERY: Mild left carotid bifurcation plaque. No flow limiting stenosis. LEFT VERTEBRAL ARTERY:  Patent with antegrade flow. IMPRESSION: 1. Mild bilateral carotid atherosclerotic vascular plaque. No flow limiting stenosis. 2. Vertebral arteries are patent with antegrade flow. Electronically Signed   By: Marcello Moores  Register   On: 02/16/2016 10:53    Assessment: 82 y.o. female presenting with complaints of left sided numbness and weakness that have improved.  Patient currently with mild left sided weakness.  MRI of the brain personally reviewed and shows many right subcortical and cortical small acute infarcts.  Patient on ASA at home.  Carotid dopplers show no hemodynamically significant stenosis.  Echocardiogram shows no cardiac source of emboli, EF 70%.  A1c 5.8.  LDL 150.  Stroke Risk Factors - none  Plan: 1. Prophylactic therapy-Antiplatelet med: Plavix - dose 75mg  daily 2. Telemetry monitoring 3. Frequent neuro checks 4. Follow up with neurology on an outpatient basis.  Need for holter to be determined at that time.     Alexis Goodell, MD Neurology [phone] 02/16/2016, 1:25 PM

## 2016-02-16 NOTE — Progress Notes (Signed)
*  PRELIMINARY RESULTS* Echocardiogram 2D Echocardiogram has been performed.  Sarah Freeman 02/16/2016, 9:44 AM

## 2016-02-16 NOTE — Telephone Encounter (Signed)
This is continued documentation of previous note. I told him what we had listed in PP for patient's medications because we have not seen the patient in epic. Patient's son asked about a cholesterol medication. I told him that I see crestor in the patient's medication list but that it was not written by our office. Patient's son wanted to cancel her physical appointment with [place] for tomorrow because he states that they told him at the hospital that they were basically doing a physical on the patient so I cancelled the appointment for tomorrow. Cheryl walked up and I told her everything that was going on and wants me to call the son and just let him know that she would like to see the patient within 2 weeks for her hospital f/u. So I called and let the patient's son know that [name] wants to see the patient within 2 weeks of her getting out of the hospital for a hospital f/u.

## 2016-02-16 NOTE — Care Management (Signed)
Admitted to [facility] with diagnosis of stroke. Lives alone. Son is Eddie [phone] or [phone]). Appointment with Kathrine Haddock NP is scheduled for tomorrow. No Home Health. No skilled facility. No home oxygen. Uses no aids for ambulation. Takes care of both basic and instrumental activities of daily living herself, drives. No falls. Good appetite. Family will transport. Family says that there is a rolling walker in the home, but its about 80 years old. Shelbie Ammons RN MSN CCM Care Management [phone]

## 2016-02-16 NOTE — Evaluation (Signed)
Physical Therapy Evaluation Patient Details Name: Sarah Freeman MRN: [id number] DOB: August 17, 1931 Today's Date: 02/16/2016   History of Present Illness  Sarah Freeman is a 82 y.o. female with a known history of hypothyroidism who presents with complaint of L sided weakness. Patient was eating lunch this afternoon and approximately 12:30 she developed numbness in both of her arms and subsequently weakness in the left leg. She was unable to stand. Her family called EMS. When she was brought here her symptoms have subsided. After her CT scan was performed which showed no evidence of stroke she was going to use the restroom at the toilet in her room. When she was trying to get up from it what she could not move her left leg. She has not develop slurred speech or facial droop or any other weakness. Her symptoms have subsequently subsided. She takes a baby aspirin on a daily basis. During PT evaluation pt is complaining of some LLE weakness in standing. Reports full resolution of LUE weakness. Pt denies numbness/tingling throughout UE/LE. No falls reported in the last 12 months. Pt has previously been very independent with ADLs/IADLs at home. Pt drives and even using riding mower to cut her yard.  Clinical Impression  Pt reports improving L sided symptoms during PT evaluation today. However she complains of some residual LLE weakness in standing with difficulty locking L knee. Denies numbness/tingling in LUE/LLE. No focal weakness identified with MMT. Pt does demonstrate mild pronator drift with LUE as well as mild dysmetria with L finger to nose testing. She drifts slightly to the L during ambulation and while she is able to ambulate without assistive device is far more stable during head turns with use of rolling walker. Balance is impaired with patient unable to achieve Rhomberg stance without UE support. Even once assisted into Rhomberg position is unable to maintain due to LOB to the L and posteriorly.  Single leg balance is <1 second bilaterally. Pt will need to use rolling walker at all times following discharge for safety and fall prevention. Would benefit from [facility] PT at discharge assuming she is homebound otherwise arrange OP PT for balance. Pt will benefit from skilled PT services to address deficits in strength, balance, and mobility in order to return to full function at home.     Follow Up Recommendations Home health PT;Other (comment) (If not homebound arrange OP PT)    Equipment Recommendations  None recommended by PT (Must use walker at all times after discharge)    Recommendations for Other Services       Precautions / Restrictions Precautions Precautions: Fall Restrictions Weight Bearing Restrictions: No      Mobility  Bed Mobility Overal bed mobility: Independent             General bed mobility comments: Good speed and sequencing noted without use of bed rails  Transfers Overall transfer level: Needs assistance Equipment used: Rolling walker (2 wheeled) Transfers: Sit to/from Stand Sit to Stand: Supervision         General transfer comment: Safe sequencing with sit to stand transfers. Good stability and proper hand placement. No gross LE weakness noted  Ambulation/Gait Ambulation/Gait assistance: Min guard Ambulation Distance (Feet): 200 Feet Assistive device: Rolling walker (2 wheeled) Gait Pattern/deviations: Step-through pattern Gait velocity: Decreased but functional   General Gait Details: Pt ambulates with therapist initially utilizing rolling walker. Pt with some mild drifting to the L during ambulation. Lateral deviations noted with horizontal and vertical head turns as well  as slowing of gait but pt able to steady herself with rolling walker. HR increases to around 110 bpm during ambulation however SaO2>95% on room air without complaints of DOE. Pt progressed to no assistive device. Initially gait is slow and guarded but increases with distances.  She demonstrates same deviations with head turns but more unsteady due to no UE support. Gait speed is functional for full household and limited community mobility  Financial trader Rankin (Stroke Patients Only)       Balance Overall balance assessment: Needs assistance Sitting-balance support: No upper extremity supported Sitting balance-Leahy Scale: Good     Standing balance support: No upper extremity supported Standing balance-Leahy Scale: Fair Standing balance comment: Pt able to maintain wide stance without UE support. Unabe to achieve narrow stance without LOB. Once assisted into feet together posture unable to maintain due to LOB posterior and to the L. Single leg balance is <1 second on each LE                             Pertinent Vitals/Pain Pain Assessment: No/denies pain    Home Living Family/patient expects to be discharged to:: Private residence Living Arrangements: Alone Available Help at Discharge: Family;Available PRN/intermittently (Family lives nearby) Type of Home: House Home Access: Stairs to enter Entrance Stairs-Rails: None (Pt states she can hold the door frame) Entrance Stairs-Number of Steps: 2 Home Layout: Laundry or work area in basement;Able to live on main level with bedroom/bathroom Home Equipment: Environmental consultant - 2 wheels;Cane - single point;Bedside commode;Shower seat;Tub bench;Grab bars - tub/shower      Prior Function Level of Independence: Independent         Comments: Drives, full community ambulator without assistive device. Mows her yard with riding [place] Hand: Right    Extremity/Trunk Assessment   Upper Extremity Assessment: LUE deficits/detail       LUE Deficits / Details: Strength and sensation appear grossly WFL and symmetrical RUE and LUE. Rapid alternating movemets intact. Pt with mild positive L pronator drift as well as mild dysmetria with L  finger to nose when compared to R side. No facial droop, tongue and uvula midline   Lower Extremity Assessment: LLE deficits/detail   LLE Deficits / Details: Strength and sensation grossly WFL bilateral. Heel to shin WNL     Communication   Communication: No difficulties  Cognition Arousal/Alertness: Awake/alert Behavior During Therapy: WFL for tasks assessed/performed Overall Cognitive Status: Within Functional Limits for tasks assessed                      General Comments      Exercises        Assessment/Plan    PT Assessment Patient needs continued PT services  PT Diagnosis Difficulty walking;Abnormality of gait   PT Problem List Decreased balance;Decreased mobility;Decreased safety awareness  PT Treatment Interventions DME instruction   PT Goals (Current goals can be found in the Care Plan section) Acute Rehab PT Goals Patient Stated Goal: Return to prior function at home PT Goal Formulation: With patient/family Time For Goal Achievement: 03/01/16 Potential to Achieve Goals: Good    Frequency 7X/week (If CVA r/o will decreased to min 2x/wk)   Barriers to discharge Decreased caregiver support Pt lives alone but has good support from family  Co-evaluation               End of Session Equipment Utilized During Treatment: Gait belt Activity Tolerance: Patient tolerated treatment well Patient left: in bed;with call bell/phone within reach;with bed alarm set;with family/visitor present;Other (comment) (Transport arrived to take pt for imaging)      Functional Assessment Tool Used: clinical judgement, SLS Functional Limitation: Mobility: Walking and moving around Mobility: Walking and Moving Around Current Status [phone]): At least 20 percent but less than 40 percent impaired, limited or restricted Mobility: Walking and Moving Around Goal Status [phone]): At least 1 percent but less than 20 percent impaired, limited or restricted    Time:  0850-0916 PT Time Calculation (min) (ACUTE ONLY): 26 min   Charges:   PT Evaluation $PT Eval Low Complexity: 1 Procedure PT Treatments $Gait Training: 8-22 mins   PT G Codes:   PT G-Codes **NOT FOR INPATIENT CLASS** Functional Assessment Tool Used: clinical judgement, SLS Functional Limitation: Mobility: Walking and moving around Mobility: Walking and Moving Around Current Status [id number]): At least 20 percent but less than 40 percent impaired, limited or restricted Mobility: Walking and Moving Around Goal Status [phone]): At least 1 percent but less than 20 percent impaired, limited or restricted   Phillips Grout PT, DPT   Nicle Connole 02/16/2016, 10:05 AM

## 2016-02-16 NOTE — Telephone Encounter (Signed)
Pt's son Ludwig Clarks states that his mother Sarah Freeman is currently at [facility] being treated for a TIA.  Pt's son would like to know what medications Ms. Crites should be taking.  He says that the hospital has some conflicting information and simply needs some clarification.  Please call Ludwig Clarks as soon as possible at [phone].

## 2016-02-16 NOTE — Progress Notes (Signed)
[place] [facility] [place] NAME: Sarah Freeman    MR#:  [id number]  DATE OF BIRTH:  November 07, 1931  SUBJECTIVE: Admitted for left-sided weakness. Did not have slurred speech or double vision. Right now she feels better. CT head unremarkable.   CHIEF COMPLAINT:   Chief Complaint  Patient presents with  . Extremity Weakness    REVIEW OF SYSTEMS:   Review of Systems  Neurological: Positive for focal weakness.   CONSTITUTIONAL: No fever, fatigue or weakness.  EYES: No blurred or double vision.  EARS, NOSE, AND THROAT: No tinnitus or ear pain.  RESPIRATORY: No cough, shortness of breath, wheezing or hemoptysis.  CARDIOVASCULAR: No chest pain, orthopnea, edema.  GASTROINTESTINAL: No nausea, vomiting, diarrhea or abdominal pain.  GENITOURINARY: No dysuria, hematuria.  ENDOCRINE: No polyuria, nocturia,  HEMATOLOGY: No anemia, easy bruising or bleeding SKIN: No rash or lesion. MUSCULOSKELETAL: No joint pain or arthritis.    PSYCHIATRY: No anxiety or depression.   DRUG ALLERGIES:  No Known Allergies  VITALS:  Blood pressure 121/62, pulse 83, temperature 97.8 F (36.6 C), temperature source Oral, resp. rate 18, height 5' (1.524 m), weight 90.084 kg (198 lb 9.6 oz), SpO2 98 %.  PHYSICAL EXAMINATION:  GENERAL:  82 y.o.-year-old patient lying in the bed with no acute distress.  EYES: Pupils equal, round, reactive to light and accommodation. No scleral icterus. Extraocular muscles intact.  HEENT: Head atraumatic, normocephalic. Oropharynx and nasopharynx clear.  NECK:  Supple, no jugular venous distention. No thyroid enlargement, no tenderness.  LUNGS: Normal breath sounds bilaterally, no wheezing, rales,rhonchi or crepitation. No use of accessory muscles of respiration.  CARDIOVASCULAR: S1, S2 normal. No murmurs, rubs, or gallops.  ABDOMEN: Soft, nontender, nondistended. Bowel sounds present. No organomegaly or mass.  EXTREMITIES: No pedal  edema, cyanosis, or clubbing.  NEUROLOGIC: Cranial nerves II through XII are intact. Muscle strength 3/5 on left  side, 5/5  on the right side. Sensation intact. Gait not checked.  PSYCHIATRIC: The patient is alert and oriented x 3.  SKIN: No obvious rash, lesion, or ulcer.    LABORATORY PANEL:   CBC  Recent Labs Lab 02/15/16 1436  WBC 8.9  HGB 13.5  HCT 40.5  PLT 289   ------------------------------------------------------------------------------------------------------------------  Chemistries   Recent Labs Lab 02/15/16 1436 02/16/16 0517  NA 135 136  K 3.7 4.0  CL 103 109  CO2 25 24  GLUCOSE 167* 105*  BUN 12 13  CREATININE 1.21* 0.95  CALCIUM 8.8* 8.1*  AST 27  --   ALT 21  --   ALKPHOS 38  --   BILITOT 0.6  --    ------------------------------------------------------------------------------------------------------------------  Cardiac Enzymes  Recent Labs Lab 02/15/16 1436  TROPONINI <0.03   ------------------------------------------------------------------------------------------------------------------  RADIOLOGY:  Ct Head Wo Contrast  02/15/2016  CLINICAL DATA:  Left-sided weakness for approximately 3 hours. EXAM: CT HEAD WITHOUT CONTRAST TECHNIQUE: Contiguous axial images were obtained from the base of the skull through the vertex without intravenous contrast. COMPARISON:  Brain MRI July 31, 2010 FINDINGS: Mild diffuse atrophy is stable. There is no intracranial mass hemorrhage, extra-axial fluid collection, or midline shift. There is patchy small vessel disease in the centra semiovale bilaterally. Elsewhere gray-white compartments appear normal. No acute infarct is evident. The bony calvarium appears intact. The visualized mastoid air cells are clear. Visualized orbits appear symmetric bilaterally. IMPRESSION: Stable atrophy with mild periventricular small vessel disease. No acute infarct evident. No hemorrhage or mass effect. Electronically Signed  By: Lowella Grip III M.D.   On: 02/15/2016 15:42    EKG:   Orders placed or performed during the hospital encounter of 02/15/16  . EKG 12-Lead  . EKG 12-Lead  . ED EKG  . ED EKG    ASSESSMENT AND PLAN:   #1 .transient left-sided weakness concerning for TIA: CT head unremarkable. Admitted for stroke evaluation. Check MRI of the brain, carotid ultrasound, echocardiogram. Continue aspirin, statins. Consult physical therapy. And consult neurology also. Patient has slight weakness on the left side but no weakness on the right side, now starts patient now double vision or blurred vision. #2 hyperlipidemia: LDL is 150. Continue statins. #3 hypothyroidism continue Synthroid.  Discussed with daughter-in-law and granddaughter.   All the records are reviewed and case discussed with Care Management/Social Workerr. Management plans discussed with the patient, family and they are in agreement.  CODE STATUS: Full  TOTAL TIME TAKING CARE OF THIS PATIENT: 35 minutes.   POSSIBLE D/C IN 1-2 DAYS, DEPENDING ON CLINICAL CONDITION.   Epifanio Lesches M.D on 02/16/2016 at 8:28 AM  Between 7am to 6pm - Pager - [phone]  After 6pm go to [email or web address] - password [email or web address] [facility]  Office  [phone]  CC: Primary care physician; Kathrine Haddock, NP   Note: This dictation was prepared with Dragon dictation along with smaller phrase technology. Any transcriptional errors that result from this process are unintentional.

## 2016-02-16 NOTE — Progress Notes (Signed)
OT Cancellation Note  Patient Details Name: [name] MRN: [id number] DOB: 08-01-1931   Cancelled Treatment:    Reason Eval/Treat Not Completed: Patient at procedure or test/ unavailable.  Pt at [place] [facility] and has MRI scheduled at 1:40pm per family report. Will attempt again later time permitting or tomorrow.  Chrys Racer, OTR/L ascom [phone]   Wofford,Susan 02/16/2016, 11:23 AM

## 2016-02-16 NOTE — Progress Notes (Signed)
Occupational Therapy Treatment Patient Details Name: Sarah Freeman MRN: [id number] DOB: [birth year] Today's Date: 02/16/2016    History of present illness Sarah Freeman is a 82 y.o. female with a known history of hypothyroidism who presents with complaint of L sided weakness. Patient was eating lunch this afternoon and approximately 12:30 she developed numbness in both of her arms and subsequently weakness in the left leg. She was unable to stand. Her family called EMS. When she was brought here her symptoms have subsided. After her CT scan was performed which showed no evidence of stroke she was going to use the restroom at the toilet in her room. When she was trying to get up from it what she could not move her left leg. She has not develop slurred speech or facial droop or any other weakness. Her symptoms have subsequently subsided. She takes a baby aspirin on a daily basis. During PT evaluation pt is complaining of some LLE weakness in standing. Reports full resolution of LUE weakness. Pt denies numbness/tingling throughout UE/LE. No falls reported in the last 12 months. Pt has previously been very independent with ADLs/IADLs at home. Pt drives and even using riding mower to cut her yard.   OT comments  Paged and spoke to Dr Vianne Bulls who agreed with rec for patient to not drive until she sees her primary care doctor and agreed to OT evaluation for either [facility] or outpatient to assess and address safety with ADLs.   Follow Up Recommendations  Home health OT    Equipment Recommendations  Tub/shower bench    Recommendations for Other Services      Precautions / Restrictions Precautions Precautions: Fall Restrictions Weight Bearing Restrictions: No       Mobility Bed Mobility                  Transfers                      Balance                                   ADL Overall ADL's : Needs assistance/impaired                                        General ADL Comments: Paged and spoke to Dr Vianne Bulls who agreed with rec for patient to not drive until she sees her primary care doctor and agreed to OT evaluation for either [facility] or outpatient to assess and address safety with ADLs.      Vision                     Perception     Praxis      Cognition   Behavior During Therapy: WFL for tasks assessed/performed Overall Cognitive Status: Within Functional Limits for tasks assessed                       Extremity/Trunk Assessment  Upper Extremity Assessment LUE Deficits / Details: Strength and sensation appear grossly WFL and symmetrical RUE and LUE. Rapid alternating movemets intact. Pt has very mild dysmetria with L finger to nose when compared to R side. No facial droop.  Intact sensation and proprioception.   Lower Extremity Assessment Lower Extremity Assessment: Defer to PT evaluation  Exercises     Shoulder Instructions       General Comments      Pertinent Vitals/ Pain       Pain Assessment: No/denies pain  Home Living Family/patient expects to be discharged to:: Private residence Living Arrangements: Alone Available Help at Discharge: Family;Available PRN/intermittently Type of Home: House (basement where she has a deep freezer) Home Access: Stairs to enter Entrance Stairs-Number of Steps: 2 Entrance Stairs-Rails: None Home Layout: Laundry or work area in basement;Able to live on main level with bedroom/bathroom     Bathroom Shower/Tub: Tub/shower unit (pt sits in tub when bathing--rec she use a transfer tub bench to increase safety) Shower/tub characteristics: Architectural technologist: Standard Bathroom Accessibility: Yes How Accessible: Accessible via walker Home Equipment: [place] - 2 wheels;Cane - single point;Bedside commode;Shower seat;Tub bench;Grab bars - tub/shower          Prior Functioning/Environment Level of Independence: Independent        Comments:  Drives, full community ambulator without assistive device. Mows her yard with riding mower   Frequency Min 1X/week     Progress Toward Goals  OT Goals(current goals can now be found in the care plan section)     Acute Rehab OT Goals Patient Stated Goal: Return to prior function at home OT Goal Formulation: With patient/family Time For Goal Achievement: 03/01/16 Potential to Achieve Goals: Good ADL Goals Pt Will Transfer to Toilet: Independently;stand pivot transfer (with FWW and no LOB) Pt/caregiver will Perform Home Exercise Program: Left upper extremity;With theraputty;With written HEP provided (coordination exercises as well L hand)  Plan      Co-evaluation                 End of Session Equipment Utilized During Treatment: Gait belt   Activity Tolerance Patient tolerated treatment well   Patient Left in chair;with call bell/phone within reach;with family/visitor present   Nurse Communication          Time: 1500-1600 OT Time Calculation (min): 60 min  Charges: OT General Charges $OT Visit: 1 Procedure OT Evaluation $OT Eval Moderate Complexity: 1 Procedure OT Treatments $Self Care/Home Management : 23-37 mins $Therapeutic Activity: 8-22 mins  Wofford,Susan 02/16/2016, 4:58 PM   Chrys Racer, OTR/L ascom [phone]

## 2016-02-17 ENCOUNTER — Encounter: Payer: Self-pay | Admitting: Unknown Physician Specialty

## 2016-02-17 MED ORDER — CLOPIDOGREL BISULFATE 75 MG PO TABS: 75.0000 mg | ORAL_TABLET | Freq: Every day | ORAL | Status: DC

## 2016-02-17 MED ORDER — ROSUVASTATIN CALCIUM 10 MG PO TABS: 10.0000 mg | ORAL_TABLET | Freq: Every day | ORAL | Status: DC

## 2016-02-17 NOTE — Progress Notes (Signed)
[place] [facility] [place] NAME: Sarah Freeman    MR#:  [id number]  DATE OF BIRTH:  May 14, 1931  SUBJECTIVE: Admitted for left-sided weakness. Did not have slurred speech or double vision. Right now she feels better. CT head unremarkable. MRI brain showed numerous water she strokes in Right MCA< No new weaknesss/  CHIEF COMPLAINT:   Chief Complaint  Patient presents with  . Extremity Weakness    REVIEW OF SYSTEMS:   Review of Systems  Neurological: Positive for focal weakness.   CONSTITUTIONAL: No fever, fatigue or weakness.  EYES: No blurred or double vision.  EARS, NOSE, AND THROAT: No tinnitus or ear pain.  RESPIRATORY: No cough, shortness of breath, wheezing or hemoptysis.  CARDIOVASCULAR: No chest pain, orthopnea, edema.  GASTROINTESTINAL: No nausea, vomiting, diarrhea or abdominal pain.  GENITOURINARY: No dysuria, hematuria.  ENDOCRINE: No polyuria, nocturia,  HEMATOLOGY: No anemia, easy bruising or bleeding SKIN: No rash or lesion. MUSCULOSKELETAL: No joint pain or arthritis.    PSYCHIATRY: No anxiety or depression.   DRUG ALLERGIES:  No Known Allergies  VITALS:  Blood pressure 122/54, pulse 84, temperature 97.6 F (36.4 C), temperature source Oral, resp. rate 18, height 5' (1.524 m), weight 90.084 kg (198 lb 9.6 oz), SpO2 98 %.  PHYSICAL EXAMINATION:  GENERAL:  82 y.o.-year-old patient lying in the bed with no acute distress.  EYES: Pupils equal, round, reactive to light and accommodation. No scleral icterus. Extraocular muscles intact.  HEENT: Head atraumatic, normocephalic. Oropharynx and nasopharynx clear.  NECK:  Supple, no jugular venous distention. No thyroid enlargement, no tenderness.  LUNGS: Normal breath sounds bilaterally, no wheezing, rales,rhonchi or crepitation. No use of accessory muscles of respiration.  CARDIOVASCULAR: S1, S2 normal. No murmurs, rubs, or gallops.  ABDOMEN: Soft, nontender,  nondistended. Bowel sounds present. No organomegaly or mass.  EXTREMITIES: No pedal edema, cyanosis, or clubbing.  NEUROLOGIC: Cranial nerves II through XII are intact. Muscle strength 3/5 on left  side, 5/5  on the right side. Sensation intact. Gait not checked.  PSYCHIATRIC: The patient is alert and oriented x 3.  SKIN: No obvious rash, lesion, or ulcer.    LABORATORY PANEL:   CBC  Recent Labs Lab 02/15/16 1436  WBC 8.9  HGB 13.5  HCT 40.5  PLT 289   ------------------------------------------------------------------------------------------------------------------  Chemistries   Recent Labs Lab 02/15/16 1436 02/16/16 0517  NA 135 136  K 3.7 4.0  CL 103 109  CO2 25 24  GLUCOSE 167* 105*  BUN 12 13  CREATININE 1.21* 0.95  CALCIUM 8.8* 8.1*  AST 27  --   ALT 21  --   ALKPHOS 38  --   BILITOT 0.6  --    ------------------------------------------------------------------------------------------------------------------  Cardiac Enzymes  Recent Labs Lab 02/15/16 1436  TROPONINI <0.03   ------------------------------------------------------------------------------------------------------------------  RADIOLOGY:  Mr Brain Wo Contrast  02/16/2016  CLINICAL DATA:  Numbness of both arms and weakness of the left leg beginning yesterday acutely. EXAM: MRI HEAD WITHOUT CONTRAST TECHNIQUE: Multiplanar, multiecho pulse sequences of the brain and surrounding structures were obtained without intravenous contrast. COMPARISON:  Head CT 02/15/2016.  MRI 07/31/2010. FINDINGS: There are numerous punctate acute infarctions affecting the right hemisphere in a watershed pattern with involvement of the cortical and subcortical brain, the deep white matter and body of the caudate nucleus. No large confluent infarction. No acute infarction in the posterior fossa or affecting the left hemisphere. The brainstem and cerebellum appear normal. Elsewhere the cerebral hemispheres  show mild changes of  chronic small vessel disease within the deep white matter. No mass lesion, hemorrhage, hydrocephalus or extra-axial collection. No pituitary mass. No inflammatory sinus disease. Small amount of fluid in the mastoid air cells on the right. Major vessels at the base of the brain show flow. IMPRESSION: Numerous tiny watershed distribution infarctions affecting the right hemisphere in the cortical and subcortical white matter, deep white matter and body of the caudate. No large confluent infarction. No evidence of mass effect or hemorrhage. Electronically Signed   By: Nelson Chimes M.D.   On: 02/16/2016 11:36   US Carotid Bilateral  02/16/2016  CLINICAL DATA:  CVA. EXAM: BILATERAL CAROTID DUPLEX ULTRASOUND TECHNIQUE: Pearline Cables scale imaging, color Doppler and duplex ultrasound were performed of bilateral carotid and vertebral arteries in the neck. COMPARISON:  CT 02/15/2016. FINDINGS: Criteria: Quantification of carotid stenosis is based on velocity parameters that correlate the residual internal carotid diameter with NASCET-based stenosis levels, using the diameter of the distal internal carotid lumen as the denominator for stenosis measurement. The following velocity measurements were obtained: RIGHT ICA:  141/29 cm/sec CCA:  [id number] cm/sec SYSTOLIC ICA/CCA RATIO:  1.3 DIASTOLIC ICA/CCA RATIO:  1.7 ECA:  128 cm/sec LEFT ICA:  147/30 cm/sec CCA:  [id number] cm/sec SYSTOLIC ICA/CCA RATIO:  1.3 DIASTOLIC ICA/CCA RATIO:  1.8 ECA:  129 cm/sec RIGHT CAROTID ARTERY: Mild right carotid bifurcation plaque. No flow limiting stenosis. RIGHT VERTEBRAL ARTERY:  Patent with antegrade flow. LEFT CAROTID ARTERY: Mild left carotid bifurcation plaque. No flow limiting stenosis. LEFT VERTEBRAL ARTERY:  Patent with antegrade flow. IMPRESSION: 1. Mild bilateral carotid atherosclerotic vascular plaque. No flow limiting stenosis. 2. Vertebral arteries are patent with antegrade flow. Electronically Signed   By: Marcello Moores  Register   On: 02/16/2016  10:53    EKG:   Orders placed or performed during the hospital encounter of 02/15/16  . EKG 12-Lead  . EKG 12-Lead  . ED EKG  . ED EKG    ASSESSMENT AND PLAN:   #1 .transient left-sided weakness concerning for TIA: CT head unremarkable. Admitted for stroke evaluation. MRI bran  Confirmed acute stroke Pt advised to take  asa,and plavix,   #2 hyperlipidemia: LDL is 150. Continue statins. #3 hypothyroidism continue Synthroid.  Discussed with daughter-in-law and granddaughter. D/c home with asa,plavix,statins  needsOut pt follow up with  Neuro  For  possible holter  All the records are reviewed and case discussed with Care Management/Social Workerr. Management plans discussed with the patient, family and they are in agreement.  CODE STATUS: Full  TOTAL TIME TAKING CARE OF THIS PATIENT: 35 minutes.   discharge  home   Taylan Marez M.D on 02/17/2016 at 10:37 PM  Between 7am to 6pm - Pager - [phone]  After 6pm go to [email or web address] - password [email or web address] [facility]  Office  [phone]  CC: Primary care physician; Kathrine Haddock, NP   Note: This dictation was prepared with Dragon dictation along with smaller phrase technology. Any transcriptional errors that result from this process are unintentional.

## 2016-02-17 NOTE — Progress Notes (Signed)
Physical Therapy Treatment Patient Details Name: Sarah Freeman MRN: [id number] DOB: [birth year] Today's Date: 02/17/2016    History of Present Illness Sarah Freeman is a 82 y.o. female with a known history of hypothyroidism who presents with complaint of L sided weakness. Patient was eating lunch this afternoon and approximately 12:30 she developed numbness in both of her arms and subsequently weakness in the left leg. She was unable to stand. Her family called EMS. When she was brought here her symptoms have subsided. After her CT scan was performed which showed no evidence of stroke she was going to use the restroom at the toilet in her room. When she was trying to get up from it what she could not move her left leg. She has not develop slurred speech or facial droop or any other weakness. Her symptoms have subsequently subsided. She takes a baby aspirin on a daily basis. During PT evaluation pt is complaining of some LLE weakness in standing. Reports full resolution of LUE weakness. Pt denies numbness/tingling throughout UE/LE. No falls reported in the last 12 months. Pt has previously been very independent with ADLs/IADLs at home. Pt drives and even using riding mower to cut her yard.    PT Comments    Pt eager to go home. Pt progressing well in all areas. Displays little deficit in balance and strength, only noted mildly with stand exercises with single upper extremity support versus bilateral upper extremity support. Pt demonstrates good balance with ambulation with rolling walker tolerating conversation, head turns, maneuvering around obstacles and turns/chair approach without difficulty. Pt does note greater confidence with rolling walker/bilateral upper extremity support than without. Increased distance with ambulation as well and demonstrates up/down steps with safe technique. Pt notes railing at basement steps, but none at garage steps (2 steps). Pt/family educated and encouraged to have  grab bar placed for optimal safety; family agrees. Pt currently has discharge orders to home with home health. Complete current PT orders.   Follow Up Recommendations  Home health PT;Other (comment)     Equipment Recommendations  Rolling walker with 5" wheels    Recommendations for Other Services       Precautions / Restrictions Precautions Precautions: Fall Restrictions Weight Bearing Restrictions: No    Mobility  Bed Mobility Overal bed mobility: Independent                Transfers Overall transfer level: Modified independent Equipment used: Rolling walker (2 wheeled) Transfers: Sit to/from Stand Sit to Stand: Supervision         General transfer comment: Safe technique and use of rw  Ambulation/Gait Ambulation/Gait assistance: Supervision Ambulation Distance (Feet): 275 Feet Assistive device: Rolling walker (2 wheeled) Gait Pattern/deviations: WFL(Within Functional Limits) Gait velocity: subjectively decreased, but not limiting Gait velocity interpretation: Below normal speed for age/gender General Gait Details: Ambulates well without LOB, drift or weakness noted. Pt able to carry on conversation throughout and tolerates head turns, thresholds, turns and chair approach well. Mild forward flexed posture chronic in nature    Stairs Stairs: Yes Stairs assistance: Min guard Stair Management: One rail Right;Step to pattern;Sideways;Forwards (up forward in RUE; down sideways with BUE on L rail) Number of Stairs: 6 General stair comments: Subjective as pt has done for some time. No issues. Encouraged this pattern for optimal safety. Did discuss placeing grab bar at STE, as pt does not have rail.from garage into house. There is rail to basement steps.   Wheelchair Mobility    Modified Rankin (  Stroke Patients Only)       Balance Overall balance assessment: Modified Independent Sitting-balance support: No upper extremity supported;Feet supported Sitting  balance-Leahy Scale: Normal     Standing balance support: Bilateral upper extremity supported Standing balance-Leahy Scale: Good Standing balance comment: Performs stand exercises with 1 UE supports with fair balance; BUE support Good balance                    Cognition Arousal/Alertness: Awake/alert Behavior During Therapy: WFL for tasks assessed/performed Overall Cognitive Status: Within Functional Limits for tasks assessed                      Exercises General Exercises - Lower Extremity Hip ABduction/ADduction: AROM;Both;10 reps;Standing;Strengthening (BUE support) Straight Leg Raises: AROM;Strengthening;Both;10 reps;Standing (1 UE support) Hip Flexion/Marching: AROM;Strengthening;Left;10 reps;Standing (1 UE support) Toe Raises:  (BUE support) Heel Raises: Strengthening;Both;10 reps;Standing (BUE support) Mini-Sqauts: Strengthening;Both;10 reps;Standing (BUE support) Other Exercises Other Exercises: B hip extension, stand 10x ea BUE support    General Comments        Pertinent Vitals/Pain Pain Assessment: No/denies pain    Home Living                      Prior Function            PT Goals (current goals can now be found in the care plan section) Acute Rehab PT Goals Patient Stated Goal: Return to prior function at home Progress towards PT goals: Progressing toward goals    Frequency  7X/week    PT Plan Current plan remains appropriate    Co-evaluation             End of Session Equipment Utilized During Treatment: Gait belt Activity Tolerance: Patient tolerated treatment well Patient left: in chair;with call bell/phone within reach;with chair alarm set;with family/visitor present     Time: [id number] PT Time Calculation (min) (ACUTE ONLY): 39 min  Charges:  $Gait Training: 23-37 mins $Therapeutic Exercise: 8-22 mins                    G Codes:      Charlaine Dalton, PTA 02/17/2016, 11:32 AM

## 2016-02-17 NOTE — Care Management Important Message (Signed)
Important Message  Patient Details  Name: Sarah Freeman MRN: [id number] Date of Birth: 1931/06/16   Medicare Important Message Given:  Yes    [name] 02/17/2016, 10:25 AM

## 2016-02-17 NOTE — Progress Notes (Signed)
Occupational Therapy Treatment Patient Details Name: Sarah Freeman MRN: [id number] DOB: 04-24-1931 Today's Date: 02/17/2016    History of present illness Sarah Freeman is a 82 y.o. female with a known history of hypothyroidism who presents with complaint of L sided weakness. Patient was eating lunch this afternoon and approximately 12:30 she developed numbness in both of her arms and subsequently weakness in the left leg. She was unable to stand. Her family called EMS. When she was brought here her symptoms have subsided. After her CT scan was performed which showed no evidence of stroke she was going to use the restroom at the toilet in her room. When she was trying to get up from it what she could not move her left leg. She has not develop slurred speech or facial droop or any other weakness. Her symptoms have subsequently subsided. She takes a baby aspirin on a daily basis. During PT evaluation pt is complaining of some LLE weakness in standing. Reports full resolution of LUE weakness. Pt denies numbness/tingling throughout UE/LE. No falls reported in the last 12 months. Pt has previously been very independent with ADLs/IADLs at home. Pt drives and even using riding mower to cut her yard.   OT comments  Pt seen for ADLs using FWW with supervision with minimal sway when standing at sink leaning forward for grooming skills.  Pt complete UB and LB dressing skills with supervision as well with no LOB but stated that she was going to sit down in the tub when she gets home.  Rec HH OT since Dr Elliot Gurney agreed with pt not driving until she sees her primary care doctor.  Rec BSC over toilet and use of transfer tub bench in tub to prevent falls and increase safety.  Son and his wife in room and agreed with plan and rec. NSG updated that patient was dressed and completed grooming skills.    Follow Up Recommendations  Home health OT    Equipment Recommendations  Tub/shower bench;3 in 1 bedside comode (BSC  over toilet)    Recommendations for Other Services      Precautions / Restrictions Precautions Precautions: Fall Restrictions Weight Bearing Restrictions: No       Mobility Bed Mobility                  Transfers                      Balance                                   ADL Overall ADL's : Needs assistance/impaired                                       General ADL Comments: Pt seen for ADLs for UB and LB dressing including stockings and shoes with supervision.  Standing to complete grooming skills at sink with minimal sway and supervised with FWW while ambulating.  Pt stated she is going to get in the tub when she gets home and discusesed and rec using the transfer tub bench she has and BSC over tolet to increase safety at home and prevent falls.      Vision                     Perception  Praxis      Cognition                             Extremity/Trunk Assessment               Exercises     Shoulder Instructions       General Comments      Pertinent Vitals/ Pain          Home Living                                          Prior Functioning/Environment              Frequency Min 1X/week     Progress Toward Goals  OT Goals(current goals can now be found in the care plan section)  Progress towards OT goals: Progressing toward goals  Acute Rehab OT Goals Patient Stated Goal: Return to prior function at home OT Goal Formulation: With patient/family Time For Goal Achievement: 03/01/16 Potential to Achieve Goals: Good  Plan      Co-evaluation                 End of Session Equipment Utilized During Treatment: Gait belt   Activity Tolerance Patient tolerated treatment well   Patient Left in chair;with call bell/phone within reach;with family/visitor present   Nurse Communication          Time: [id number] OT Time Calculation  (min): 40 min  Charges: OT General Charges $OT Visit: 1 Procedure OT Treatments $Self Care/Home Management : 38-52 mins  Wofford,Susan 02/17/2016, 11:11 AM   Chrys Racer, OTR/L ascom [phone]

## 2016-02-17 NOTE — Care Management (Signed)
Discharge to home today per Dr. Vianne Bulls. Spoke with Ms. Aulbach about home health agencies. Would like [facility]. Will update Floydene Flock, [facility] representative. Will need rolling walker. Will Anderson. [facility] updated. Family will transport. Shelbie Ammons RN MSN CCM Care Management [phone]

## 2016-02-17 NOTE — Discharge Summary (Signed)
Pt IV and tele removed.  Pt DC papers given, explained and educated.  Informed of future s/sx of repeat stroke and what to do.  RN assessment and VS revealed stability for DC to home.  Home health will be contacted to set up first visit to continue to assess.  Pt told of suggested FU visits, scripts sent to mail order pharm and also given 5 day scripts till mail order arrives.  Pt also going home with Walker.  Pt will be wheeled to front and family driving home via car.

## 2016-02-24 NOTE — Discharge Summary (Signed)
Sarah Freeman, is a 82 y.o. female  DOB Jun 08, 1931  MRN [id number].  Admission date:  02/15/2016  Admitting Physician  Bettey Costa, MD  Discharge Date:  02/17/2016   Primary MD  Kathrine Haddock, NP  Recommendations for primary care physician for things to follow:   Follow up with PMD In  One week Follow up with Dr.Potter in one week   Admission Diagnosis  Left-sided weakness [M62.89] Transient cerebral ischemia, unspecified transient cerebral ischemia type [G45.9]   Discharge Diagnosis  Left-sided weakness [M62.89] Transient cerebral ischemia, unspecified transient cerebral ischemia type [G45.9]    Active Problems:   Stroke (cerebrum) [facility])      Past Medical History  Diagnosis Date  . Hypothyroid     Past Surgical History  Procedure Laterality Date  . Dilation and curettage of uterus    . Eye surgery    . Colonoscopy      Dr Nicolasa Ducking  . Abdominal hysterectomy      age 22  . Breast biopsy Left 2000?    neg. dr. Dwyane Luo office  . Cholecystectomy  1964       History of present illness and  Hospital Course:     Kindly see H&P for history of present illness and admission details, please review complete Labs, Consult reports and Test reports for all details in brief  HPI  from the history and physical done on the day of admission 82 year old female patient with history of thyroidism came in because of weakness and numbness on the left side associated with some slurred speech. Admitted for TIA evaluation. CT of the head on admission did not show any acute stroke.   Hospital Course   Transient left-sided weakness resolved by the time she came. Head CT unremarkable. Admitted to stroke unit, neuro checks were done, started an aspirin, MRI of the brain with numerous tiny watershed distribution infarcts in the  right  MCA. Bilateral carotid ultrasound showed carotid plaque without any hemodynamically significant stenosis. Echocardiogram showed EF more than 75%. Patient's LDL is more than 150. Echo did not show any cardiac source of emboli. Started on Plavix 75 mg by mouth daily. Patient needs appointment with outpatient neurology with Dr. Melrose Nakayama, need for Holter to be determined at that time. She'll be seen by physical therapy, occupational therapy. [facility] and [facility]. She neurological he felt stronger than the weakness on the left side resolved.  #2 hyperlipidemia: Continue statins. discharged  home with Crestor 10 mg by mouth daily. #3 hypothyroidism continue statins.     Discharge Condition: stable   Follow UP  Follow-up Information    Follow up with Vickki Hearing, MD On 02/25/2016.   Specialty:  Neurology   Why:  at 2:45 For Follow-up Appointment Right MCA stroke.   Contact information:   [facility] [place] [address] [phone]       Follow up with Kathrine Haddock, NP On 02/25/2016.   Specialties:  Nurse Practitioner, Family Medicine   Why:  at 10:45   Contact information:   [id number] [place] [address] [phone]         Discharge Instructions  and  Discharge Medications    Discharge Instructions    Ambulatory referral to Occupational Therapy    Complete by:  As directed      Ambulatory referral to Physical Therapy    Complete by:  As directed   Iontophoresis -  4 mg/ml of dexamethasone:  No  T.E.N.S. Unit Evaluation and Dispense as Indicated:  No     Face-to-face encounter (required for Medicare/Medicaid patients)    Complete by:  As directed   I Sarah Freeman certify that this patient is under my care and that I, or a nurse practitioner or physician's assistant working with me, had a face-to-face encounter that meets the physician face-to-face  encounter requirements with this patient on 02/17/2016. The encounter with the patient was in whole, or in part for the following medical condition(s) which is the primary reason for home health care   Acute stroke hlp hypothyroidism  The encounter with the patient was in whole, or in part, for the following medical condition, which is the primary reason for home health care:  whole  I certify that, based on my findings, the following services are medically necessary home health services:  Physical therapy  Reason for Medically Necessary Home Health Services:  Therapy- Therapeutic Exercises to Increase Strength and Endurance  My clinical findings support the need for the above services:  Unable to leave home safely without assistance and/or assistive device  Further, I certify that my clinical findings support that this patient is homebound due to:  Unable to leave home safely without assistance     Home Health    Complete by:  As directed   To provide the following care/treatments:   PT OT Home Health Aide              Medication List    TAKE these medications        aspirin 81 MG tablet  Take 81 mg by mouth at bedtime.     clopidogrel 75 MG tablet  Commonly known as:  PLAVIX  Take 1 tablet (75 mg total) by mouth daily.     levothyroxine 50 MCG tablet  Commonly known as:  SYNTHROID, LEVOTHROID  Take 50 mcg by mouth at bedtime.     rosuvastatin 10 MG tablet  Commonly known as:  CRESTOR  Take 1 tablet (10 mg total) by mouth daily.          Diet and Activity recommendation: See Discharge Instructions above   Consults obtained - neurology   Major procedures and Radiology Reports - PLEASE review detailed and final reports for all details, in brief -      Ct Head Wo Contrast  02/15/2016  CLINICAL DATA:  Left-sided weakness for approximately 3 hours. EXAM: CT HEAD WITHOUT CONTRAST TECHNIQUE: Contiguous axial images were obtained from the base of the skull through the  vertex without intravenous contrast. COMPARISON:  Brain MRI July 31, 2010 FINDINGS: Mild diffuse atrophy is stable. There is no intracranial mass hemorrhage, extra-axial fluid collection, or midline shift. There is patchy small vessel disease in the centra semiovale bilaterally. Elsewhere gray-white compartments appear normal. No acute infarct is evident. The bony calvarium appears intact. The visualized mastoid air cells are clear. Visualized orbits appear symmetric bilaterally. IMPRESSION: Stable atrophy with mild periventricular small vessel disease. No acute infarct evident. No hemorrhage or mass effect. Electronically Signed   By: Lowella Grip III M.D.   On: 02/15/2016 15:42   Mr Brain Wo Contrast  02/16/2016  CLINICAL DATA:  Numbness of both arms and weakness of the left leg beginning yesterday acutely. EXAM: MRI HEAD WITHOUT CONTRAST TECHNIQUE: Multiplanar, multiecho pulse sequences of the brain and surrounding structures were obtained without intravenous contrast. COMPARISON:  Head CT 02/15/2016.  MRI 07/31/2010. FINDINGS: There are numerous punctate  acute infarctions affecting the right hemisphere in a watershed pattern with involvement of the cortical and subcortical brain, the deep white matter and body of the caudate nucleus. No large confluent infarction. No acute infarction in the posterior fossa or affecting the left hemisphere. The brainstem and cerebellum appear normal. Elsewhere the cerebral hemispheres show mild changes of chronic small vessel disease within the deep white matter. No mass lesion, hemorrhage, hydrocephalus or extra-axial collection. No pituitary mass. No inflammatory sinus disease. Small amount of fluid in the mastoid air cells on the right. Major vessels at the base of the brain show flow. IMPRESSION: Numerous tiny watershed distribution infarctions affecting the right hemisphere in the cortical and subcortical white matter, deep white matter and body of the caudate.  No large confluent infarction. No evidence of mass effect or hemorrhage. Electronically Signed   By: Nelson Chimes M.D.   On: 02/16/2016 11:36   US Carotid Bilateral  02/16/2016  CLINICAL DATA:  CVA. EXAM: BILATERAL CAROTID DUPLEX ULTRASOUND TECHNIQUE: Pearline Cables scale imaging, color Doppler and duplex ultrasound were performed of bilateral carotid and vertebral arteries in the neck. COMPARISON:  CT 02/15/2016. FINDINGS: Criteria: Quantification of carotid stenosis is based on velocity parameters that correlate the residual internal carotid diameter with NASCET-based stenosis levels, using the diameter of the distal internal carotid lumen as the denominator for stenosis measurement. The following velocity measurements were obtained: RIGHT ICA:  141/29 cm/sec CCA:  [id number] cm/sec SYSTOLIC ICA/CCA RATIO:  1.3 DIASTOLIC ICA/CCA RATIO:  1.7 ECA:  128 cm/sec LEFT ICA:  147/30 cm/sec CCA:  [id number] cm/sec SYSTOLIC ICA/CCA RATIO:  1.3 DIASTOLIC ICA/CCA RATIO:  1.8 ECA:  129 cm/sec RIGHT CAROTID ARTERY: Mild right carotid bifurcation plaque. No flow limiting stenosis. RIGHT VERTEBRAL ARTERY:  Patent with antegrade flow. LEFT CAROTID ARTERY: Mild left carotid bifurcation plaque. No flow limiting stenosis. LEFT VERTEBRAL ARTERY:  Patent with antegrade flow. IMPRESSION: 1. Mild bilateral carotid atherosclerotic vascular plaque. No flow limiting stenosis. 2. Vertebral arteries are patent with antegrade flow. Electronically Signed   By: Marcello Moores  Register   On: 02/16/2016 10:53    Micro Results    No results found for this or any previous visit (from the past 240 hour(s)).     Today   Subjective:   Sarah Freeman today has no headache,no chest abdominal pain,no new weakness tingling or numbness, feels much better wants to go home today.   Objective:   Blood pressure 122/54, pulse 84, temperature 97.6 F (36.4 C), temperature source Oral, resp. rate 18, height 5' (1.524 m), weight 90.084 kg (198 lb 9.6 oz), SpO2 98  %.  No intake or output data in the 24 hours ending 02/24/16 1043  Exam Awake Alert, Oriented x 3, No new F.N deficits, Normal affect [place].AT,PERRAL Supple Neck,No JVD, No cervical lymphadenopathy appriciated.  Symmetrical Chest wall movement, Good air movement bilaterally, CTAB RRR,No Gallops,Rubs or new Murmurs, No Parasternal Heave +ve B.Sounds, Abd Soft, Non tender, No organomegaly appriciated, No rebound -guarding or rigidity. No Cyanosis, Clubbing or edema, No new Rash or bruise  Data Review   CBC w Diff:  Lab Results  Component Value Date   WBC 8.9 02/15/2016   HGB 13.5 02/15/2016   HCT 40.5 02/15/2016   PLT 289 02/15/2016   LYMPHOPCT 43 02/15/2016   MONOPCT 8 02/15/2016   EOSPCT 3 02/15/2016   BASOPCT 1 02/15/2016    CMP:  Lab Results  Component Value Date   NA 136 02/16/2016   K 4.0 02/16/2016  CL 109 02/16/2016   CO2 24 02/16/2016   BUN 13 02/16/2016   CREATININE 0.95 02/16/2016   PROT 7.4 02/15/2016   ALBUMIN 4.1 02/15/2016   BILITOT 0.6 02/15/2016   ALKPHOS 38 02/15/2016   AST 27 02/15/2016   ALT 21 02/15/2016  .   Total Time in preparing paper work, data evaluation and todays exam - 41 minutes  Sarah Freeman M.D on 02/17/2016 at 10:43 AM    Note: This dictation was prepared with Dragon dictation along with smaller phrase technology. Any transcriptional errors that result from this process are unintentional.

## 2016-02-25 ENCOUNTER — Ambulatory Visit (INDEPENDENT_AMBULATORY_CARE_PROVIDER_SITE_OTHER): Payer: Medicare Other | Admitting: Unknown Physician Specialty

## 2016-02-25 ENCOUNTER — Encounter: Payer: Self-pay | Admitting: Unknown Physician Specialty

## 2016-02-25 VITALS — BP 123/81 | HR 102 | Temp 98.4°F | Ht 59.3 in | Wt 195.8 lb

## 2016-02-25 DIAGNOSIS — I69311 Memory deficit following cerebral infarction: Secondary | ICD-10-CM

## 2016-02-25 DIAGNOSIS — R29898 Other symptoms and signs involving the musculoskeletal system: Secondary | ICD-10-CM | POA: Insufficient documentation

## 2016-02-25 DIAGNOSIS — I63311 Cerebral infarction due to thrombosis of right middle cerebral artery: Secondary | ICD-10-CM

## 2016-02-25 DIAGNOSIS — K219 Gastro-esophageal reflux disease without esophagitis: Secondary | ICD-10-CM | POA: Diagnosis not present

## 2016-02-25 DIAGNOSIS — E039 Hypothyroidism, unspecified: Secondary | ICD-10-CM

## 2016-02-25 DIAGNOSIS — Z8673 Personal history of transient ischemic attack (TIA), and cerebral infarction without residual deficits: Secondary | ICD-10-CM | POA: Insufficient documentation

## 2016-02-25 MED ORDER — LEVOTHYROXINE SODIUM 50 MCG PO TABS: 50.0000 ug | ORAL_TABLET | Freq: Every day | ORAL | Status: DC

## 2016-02-25 MED ORDER — ROSUVASTATIN CALCIUM 10 MG PO TABS: 10.0000 mg | ORAL_TABLET | Freq: Every day | ORAL | Status: DC

## 2016-02-25 MED ORDER — CLOPIDOGREL BISULFATE 75 MG PO TABS: 75.0000 mg | ORAL_TABLET | Freq: Every day | ORAL | Status: DC

## 2016-02-25 NOTE — Assessment & Plan Note (Addendum)
Patient was started on Plavix 75 mg and Crestor 10 mg in hospital one week ago. Will continue to monitor BP and cholesterol. Check CMP and lipid panel at next visit.

## 2016-02-25 NOTE — Assessment & Plan Note (Signed)
No recent TSH done during hospitalization. Will do at next visit in one month.

## 2016-02-25 NOTE — Progress Notes (Signed)
BP 123/81 mmHg  Pulse 102  Temp(Src) 98.4 F (36.9 C)  Ht 4' 11.3" (1.506 m)  Wt 195 lb 12.8 oz (88.814 kg)  BMI 39.16 kg/m2  SpO2 97%   Subjective:    Patient ID: Sarah Freeman, female    DOB: 1931/10/28, 82 y.o.   MRN: [id number]  HPI: Sarah Freeman is a 82 y.o. female  Chief Complaint  Patient presents with  . Hospitalization Follow-up    pt states she had a TIA  . other    pt states she wants to know if she can take prilosec and she states she stopped taking premarin   TIA: Patient was recently hospitalized for TIA. Patient was started on Plavix 75 mg and Crestor 10 mg in hospital.   Heartburn: Patient was encouraged to stop taking Nexium at last visit and try OTC Tums due to long term use and risk of kidney disease. She is now concerned because she is "popping them like candy."  Wants to know what she can take to relieve symptoms.   Memory: Son states that his mother is having trouble with forgetfulness lately. Issues such as writing checks incorrectly and having to rewrite.   Relevant past medical, surgical, family and social history reviewed and updated as indicated. Interim medical history since our last visit reviewed. Allergies and medications reviewed and updated.  Review of Systems  Constitutional: Negative.   HENT: Negative.   Eyes: Negative.   Respiratory: Negative.   Cardiovascular: Negative.   Gastrointestinal: Negative.   Endocrine: Negative.   Genitourinary: Negative.   Musculoskeletal: Negative.   Skin: Negative.   Allergic/Immunologic: Negative.   Neurological: Negative.   Hematological: Negative.   Psychiatric/Behavioral: Negative.     Per HPI unless specifically indicated above     Objective:    BP 123/81 mmHg  Pulse 102  Temp(Src) 98.4 F (36.9 C)  Ht 4' 11.3" (1.506 m)  Wt 195 lb 12.8 oz (88.814 kg)  BMI 39.16 kg/m2  SpO2 97%  Wt Readings from Last 3 Encounters:  02/25/16 195 lb 12.8 oz (88.814 kg)  02/15/16 198 lb 9.6 oz  (90.084 kg)  02/14/15 201 lb (91.173 kg)    Physical Exam  Constitutional: She is oriented to person, place, and time. She appears well-developed and well-nourished. No distress.  HENT:  Head: Normocephalic and atraumatic.  Eyes: Conjunctivae and lids are normal. Right eye exhibits no discharge. Left eye exhibits no discharge. No scleral icterus.  Cardiovascular: Normal rate.   Pulmonary/Chest: Effort normal.  Abdominal: Normal appearance. There is no splenomegaly or hepatomegaly.  Musculoskeletal: Normal range of motion.  Neurological: She is alert and oriented to person, place, and time. Coordination and gait normal.  Left leg muscle strength 3/5. Right leg muscle strength 4/5.   Skin: Skin is intact. No rash noted. No pallor.  Psychiatric: She has a normal mood and affect. Her behavior is normal. Judgment and thought content normal.        Assessment & Plan:   Problem List Items Addressed This Visit      Unprioritized   Hypothyroidism    No recent TSH done during hospitalization. Will do at next visit in one month.      Relevant Medications   levothyroxine (SYNTHROID, LEVOTHROID) 50 MCG tablet   GERD (gastroesophageal reflux disease)    No relief using OTC Tums. Start back on Nexium.       Relevant Medications   PSYLLIUM PO   Stroke (cerebrum) (HCC) -  Primary    Patient was started on Plavix 75 mg and Crestor 10 mg in hospital one week ago. Will continue to monitor BP and cholesterol. Check CMP and lipid panel at next visit.       Relevant Medications   rosuvastatin (CRESTOR) 10 MG tablet    Other Visit Diagnoses    Memory deficit after cerebral infarction        Instructed patient and son to allow more time after TIA to see if memory returns to baseline. Will continue to monitor and consider checking vitamin levels         Follow up plan: Return in about 4 weeks (around 03/24/2016).

## 2016-02-25 NOTE — Assessment & Plan Note (Signed)
No relief using OTC Tums. Start back on Nexium.

## 2016-03-24 ENCOUNTER — Other Ambulatory Visit: Payer: Self-pay

## 2016-03-24 DIAGNOSIS — R920 Mammographic microcalcification found on diagnostic imaging of breast: Secondary | ICD-10-CM

## 2016-03-30 ENCOUNTER — Ambulatory Visit (INDEPENDENT_AMBULATORY_CARE_PROVIDER_SITE_OTHER): Payer: Medicare Other | Admitting: Unknown Physician Specialty

## 2016-03-30 ENCOUNTER — Encounter: Payer: Self-pay | Admitting: Unknown Physician Specialty

## 2016-03-30 VITALS — BP 123/79 | HR 73 | Temp 97.9°F | Ht 59.1 in | Wt 196.2 lb

## 2016-03-30 DIAGNOSIS — E039 Hypothyroidism, unspecified: Secondary | ICD-10-CM

## 2016-03-30 DIAGNOSIS — E7800 Pure hypercholesterolemia, unspecified: Secondary | ICD-10-CM

## 2016-03-30 LAB — LIPID PANEL PICCOLO, WAIVED
CHOL/HDL RATIO PICCOLO,WAIVE: 2.6 mg/dL
CHOLESTEROL PICCOLO, WAIVED: 137 mg/dL (ref <200)
HDL Chol Piccolo, Waived: 52 mg/dL — ABNORMAL LOW (ref >59)
LDL CHOL CALC PICCOLO WAIVED: 54 mg/dL (ref <100)
Triglycerides Piccolo,Waived: 155 mg/dL — ABNORMAL HIGH (ref <150)
VLDL Chol Calc Piccolo,Waive: 31 mg/dL — ABNORMAL HIGH (ref <30)

## 2016-03-30 NOTE — Assessment & Plan Note (Addendum)
No complication with medication. Ordered TSH.

## 2016-03-30 NOTE — Progress Notes (Signed)
BP 123/79 mmHg  Pulse 73  Temp(Src) 97.9 F (36.6 C)  Ht 4' 11.1" (1.501 m)  Wt 196 lb 3.2 oz (88.996 kg)  BMI 39.50 kg/m2  SpO2 96%   Subjective:    Patient ID: Sarah Freeman, female    DOB: [birth year], 82 y.o.   MRN: [id number]  HPI: Sarah Freeman is a 82 y.o. female  Chief Complaint  Patient presents with  . Hyperlipidemia  . Hypothyroidism    Pt presents in clinic for 1 month follow-up. Pt reports she is doing well with medications. Denies complications at home.   Hypercholesterolemia Using medications without problems. No Muscle aches  Diet compliance: pt reports maintaining a heart healthy diet, as instructed in the hospital.  Exercise: denies exercise regimen.   Hypothyroidism Current symptoms: none. Pt denies change in energy level, diarrhea, heat / cold intolerance, nervousness, palpitations and weight changes. Symptoms have stabilized and been well-controlled. Pt takes medication daily at the same time.   Memory/Cognition Pt states that her son, recognized a memory change. However, she has not noted any complications.  Pt able to manage financial obligations, cook and drive.   Relevant past medical, surgical,amily and social history reviewed and updated as indicated. Interim medical history since our last visit reviewed. Allergies and medications reviewed and updated.  Review of Systems  Constitutional: Negative.  Negative for activity change, appetite change and fatigue.  HENT: Negative.   Eyes: Negative.   Respiratory: Negative.  Negative for chest tightness and shortness of breath.   Cardiovascular: Negative.  Negative for chest pain and palpitations.  Gastrointestinal: Negative.   Endocrine: Negative.  Negative for cold intolerance and heat intolerance.  Genitourinary: Negative.   Musculoskeletal: Negative.   Skin: Negative.   Allergic/Immunologic: Negative.   Neurological: Negative.  Negative for dizziness, speech difficulty, weakness,  light-headedness and headaches.  Hematological: Negative.   Psychiatric/Behavioral: Negative.  Negative for confusion and decreased concentration.    Per HPI unless specifically indicated above     Objective:    BP 123/79 mmHg  Pulse 73  Temp(Src) 97.9 F (36.6 C)  Ht 4' 11.1" (1.501 m)  Wt 196 lb 3.2 oz (88.996 kg)  BMI 39.50 kg/m2  SpO2 96%  Wt Readings from Last 3 Encounters:  03/30/16 196 lb 3.2 oz (88.996 kg)  02/25/16 195 lb 12.8 oz (88.814 kg)  02/15/16 198 lb 9.6 oz (90.084 kg)    Physical Exam  Constitutional: She is oriented to person, place, and time. She appears well-developed and well-nourished.  HENT:  Head: Normocephalic and atraumatic.  Eyes: Conjunctivae and EOM are normal. Pupils are equal, round, and reactive to light.  Neck: Normal range of motion. Neck supple.  Cardiovascular: Normal rate, regular rhythm, normal heart sounds and intact distal pulses.   Pulmonary/Chest: Effort normal and breath sounds normal. No respiratory distress. She exhibits no tenderness.  Abdominal: Soft. She exhibits no distension.  Musculoskeletal: Normal range of motion.  Neurological: She is alert and oriented to person, place, and time.  Skin: Skin is warm and dry.  Psychiatric: She has a normal mood and affect. Her behavior is normal. Judgment and thought content normal.  Nursing note and vitals reviewed.   Results for orders placed or performed during the hospital encounter of 02/15/16  Protime-INR  Result Value Ref Range   Prothrombin Time 13.8 11.4 - 15.0 seconds   INR 1.04   APTT  Result Value Ref Range   aPTT <24 (L) 24 - 36 seconds  CBC  Result Value Ref Range   WBC 8.9 3.6 - 11.0 K/uL   RBC 4.19 3.80 - 5.20 MIL/uL   Hemoglobin 13.5 12.0 - 16.0 g/dL   HCT 40.5 35.0 - 47.0 %   MCV 96.5 80.0 - 100.0 fL   MCH 32.2 26.0 - 34.0 pg   MCHC 33.4 32.0 - 36.0 g/dL   RDW 12.5 11.5 - 14.5 %   Platelets 289 150 - 440 K/uL  Differential  Result Value Ref Range    Neutrophils Relative % 45 %   Neutro Abs 4.0 1.4 - 6.5 K/uL   Lymphocytes Relative 43 %   Lymphs Abs 3.8 (H) 1.0 - 3.6 K/uL   Monocytes Relative 8 %   Monocytes Absolute 0.7 0.2 - 0.9 K/uL   Eosinophils Relative 3 %   Eosinophils Absolute 0.3 0 - 0.7 K/uL   Basophils Relative 1 %   Basophils Absolute 0.1 0 - 0.1 K/uL  Comprehensive metabolic panel  Result Value Ref Range   Sodium 135 135 - 145 mmol/L   Potassium 3.7 3.5 - 5.1 mmol/L   Chloride 103 101 - 111 mmol/L   CO2 25 22 - 32 mmol/L   Glucose, Bld 167 (H) 65 - 99 mg/dL   BUN 12 6 - 20 mg/dL   Creatinine, Ser 1.21 (H) 0.44 - 1.00 mg/dL   Calcium 8.8 (L) 8.9 - 10.3 mg/dL   Total Protein 7.4 6.5 - 8.1 g/dL   Albumin 4.1 3.5 - 5.0 g/dL   AST 27 15 - 41 U/L   ALT 21 14 - 54 U/L   Alkaline Phosphatase 38 38 - 126 U/L   Total Bilirubin 0.6 0.3 - 1.2 mg/dL   GFR calc non Af Amer 41 (L) >60 mL/min   GFR calc Af Amer 47 (L) >60 mL/min   Anion gap 7 5 - 15  Troponin I  Result Value Ref Range   Troponin I <0.03 <0.031 ng/mL  Urinalysis complete, with microscopic ([facility] only)  Result Value Ref Range   Color, Urine STRAW (A) YELLOW   APPearance CLEAR (A) CLEAR   Glucose, UA NEGATIVE NEGATIVE mg/dL   Bilirubin Urine NEGATIVE NEGATIVE   Ketones, ur NEGATIVE NEGATIVE mg/dL   Specific Gravity, Urine 1.004 (L) 1.005 - 1.030   Hgb urine dipstick NEGATIVE NEGATIVE   pH 7.0 5.0 - 8.0   Protein, ur NEGATIVE NEGATIVE mg/dL   Nitrite NEGATIVE NEGATIVE   Leukocytes, UA NEGATIVE NEGATIVE   RBC / HPF 0-5 0 - 5 RBC/hpf   WBC, UA 0-5 0 - 5 WBC/hpf   Bacteria, UA NONE SEEN NONE SEEN   Squamous Epithelial / LPF 0-5 (A) NONE SEEN  Hemoglobin A1c  Result Value Ref Range   Hgb A1c MFr Bld 5.8 4.0 - 6.0 %  Lipid panel  Result Value Ref Range   Cholesterol 236 (H) 0 - 200 mg/dL   Triglycerides 190 (H) <150 mg/dL   HDL 48 >40 mg/dL   Total CHOL/HDL Ratio 4.9 RATIO   VLDL 38 0 - 40 mg/dL   LDL Cholesterol 150 (H) 0 - 99 mg/dL  Basic  metabolic panel  Result Value Ref Range   Sodium 136 135 - 145 mmol/L   Potassium 4.0 3.5 - 5.1 mmol/L   Chloride 109 101 - 111 mmol/L   CO2 24 22 - 32 mmol/L   Glucose, Bld 105 (H) 65 - 99 mg/dL   BUN 13 6 - 20 mg/dL   Creatinine, Ser 0.95 0.44 -  1.00 mg/dL   Calcium 8.1 (L) 8.9 - 10.3 mg/dL   GFR calc non Af Amer 54 (L) >60 mL/min   GFR calc Af Amer >60 >60 mL/min   Anion gap 3 (L) 5 - 15  ECHO LIMITED  Result Value Ref Range   Weight 3177.6 oz   Height 60 in   BP 121/62 mmHg      Assessment & Plan:   Problem List Items Addressed This Visit      Unprioritized   Hypercholesterolemia    No complications with medication. Ordered Lipid panel and CMP.       Relevant Orders   Comprehensive metabolic panel   Lipid Panel Piccolo, Waived   Hypothyroidism - Primary    No complication with medication. Ordered TSH.      Relevant Orders   TSH       Follow up plan: Return in about 3 months (around 06/30/2016).

## 2016-03-30 NOTE — Assessment & Plan Note (Signed)
No complications with medication. Ordered Lipid panel and CMP.

## 2016-03-31 LAB — COMPREHENSIVE METABOLIC PANEL
ALBUMIN: 3.8 g/dL (ref 3.5–4.7)
ALK PHOS: 38 IU/L — AB (ref 39–117)
ALT: 12 IU/L (ref 0–32)
AST: 16 IU/L (ref 0–40)
Albumin/Globulin Ratio: 1.5 (ref 1.2–2.2)
BILIRUBIN TOTAL: 0.3 mg/dL (ref 0.0–1.2)
BUN/Creatinine Ratio: 10 — ABNORMAL LOW (ref 12–28)
BUN: 9 mg/dL (ref 8–27)
CHLORIDE: 101 mmol/L (ref 96–106)
CO2: 25 mmol/L (ref 18–29)
Calcium: 9 mg/dL (ref 8.7–10.3)
Creatinine, Ser: 0.93 mg/dL (ref 0.57–1.00)
GFR calc Af Amer: 66 mL/min/{1.73_m2} (ref >59)
GFR, EST NON AFRICAN AMERICAN: 57 mL/min/{1.73_m2} — AB (ref >59)
GLUCOSE: 106 mg/dL — AB (ref 65–99)
Globulin, Total: 2.6 g/dL (ref 1.5–4.5)
POTASSIUM: 4.8 mmol/L (ref 3.5–5.2)
SODIUM: 138 mmol/L (ref 134–144)
TOTAL PROTEIN: 6.4 g/dL (ref 6.0–8.5)

## 2016-03-31 LAB — TSH: TSH: 2.16 u[IU]/mL (ref 0.450–4.500)

## 2016-04-05 ENCOUNTER — Telehealth: Payer: Self-pay | Admitting: Unknown Physician Specialty

## 2016-04-05 NOTE — Telephone Encounter (Signed)
Lab results printed and put up front for patient to pick up.

## 2016-04-05 NOTE — Telephone Encounter (Signed)
Patient needs a copy of her recent labs she will be here in after lunch to pick it up, thanks.

## 2016-06-09 ENCOUNTER — Ambulatory Visit
Admission: RE | Admit: 2016-06-09 | Discharge: 2016-06-09 | Disposition: A | Discharge status: Home / Self Care | Payer: Medicare Other | Source: Ambulatory Visit | Attending: General Surgery | Admitting: General Surgery

## 2016-06-09 ENCOUNTER — Other Ambulatory Visit: Payer: Self-pay | Admitting: General Surgery

## 2016-06-09 DIAGNOSIS — R920 Mammographic microcalcification found on diagnostic imaging of breast: Secondary | ICD-10-CM | POA: Diagnosis present

## 2016-06-09 DIAGNOSIS — R921 Mammographic calcification found on diagnostic imaging of breast: Secondary | ICD-10-CM | POA: Diagnosis not present

## 2016-06-16 ENCOUNTER — Ambulatory Visit: Payer: Medicare Other | Admitting: General Surgery

## 2016-06-17 ENCOUNTER — Telehealth: Payer: Self-pay | Admitting: General Surgery

## 2016-06-17 NOTE — Telephone Encounter (Signed)
06-17-16 @ 10:25 L/M HOME # ASKNIG HER TO RET CALL & RE-SCH APPT WITH DR BYRNETT FROM 06-16-16./MTH UNABLE TONL/M ON C# BECAUSE VOICE MAIL IS NOT SET UP/MTH

## 2016-06-25 ENCOUNTER — Encounter: Payer: Self-pay | Admitting: *Deleted

## 2016-06-28 ENCOUNTER — Encounter: Payer: Self-pay | Admitting: General Surgery

## 2016-06-28 ENCOUNTER — Ambulatory Visit (INDEPENDENT_AMBULATORY_CARE_PROVIDER_SITE_OTHER): Payer: Medicare Other | Admitting: General Surgery

## 2016-06-28 VITALS — BP 138/72 | HR 80 | Resp 14 | Ht 60.0 in | Wt 195.0 lb

## 2016-06-28 DIAGNOSIS — R920 Mammographic microcalcification found on diagnostic imaging of breast: Secondary | ICD-10-CM | POA: Diagnosis not present

## 2016-06-28 NOTE — Patient Instructions (Signed)
Continue self breast exams. Call office for any new breast issues or concerns. 

## 2016-06-28 NOTE — Progress Notes (Signed)
Patient ID: Sarah Freeman, female   DOB: 22-May-1931, 82 y.o.   MRN: [id number]  Chief Complaint  Patient presents with  . Follow-up    Mammogram     HPI Sarah Freeman is a 82 y.o. female who presents for a breast evaluation. The most recent mammogram was done on 06/09/16 . Patient does perform regular self breast checks and gets regular mammograms done. She states there is no new breast changes. She states she had a TIA on 02/15/16, she stayed two nights in the hospital.    HPI  Past Medical History:  Diagnosis Date  . Hypothyroid   . Stroke [facility])     Past Surgical History:  Procedure Laterality Date  . ABDOMINAL HYSTERECTOMY     age 52  . BREAST BIOPSY Left 2000?   neg. dr. Dwyane Luo office  . CHOLECYSTECTOMY  1964  . COLONOSCOPY     Dr Nicolasa Ducking  . DILATION AND CURETTAGE OF UTERUS    . EYE SURGERY      Family History  Problem Relation Age of Onset  . Cancer Father     prostate  . Stroke Father   . Hypertension Sister   . Hyperlipidemia Daughter   . Heart disease Son   . Diabetes Maternal Grandmother   . Heart disease Maternal Grandmother   . Heart disease Maternal Grandfather   . Heart disease Daughter   . Breast cancer Neg Hx     Social History Social History  Substance Use Topics  . Smoking status: Never Smoker  . Smokeless tobacco: Never Used  . Alcohol use No    No Active Allergies  Current Outpatient Prescriptions  Medication Sig Dispense Refill  . acetaminophen (TYLENOL ARTHRITIS PAIN) 650 MG CR tablet Take 650 mg by mouth every 8 (eight) hours as needed for pain.    Marland Kitchen aspirin 81 MG tablet Take 81 mg by mouth at bedtime.     . clopidogrel (PLAVIX) 75 MG tablet Take 1 tablet (75 mg total) by mouth daily. 90 tablet 1  . diphenhydramine-acetaminophen (TYLENOL PM) 25-500 MG TABS tablet Take 1 tablet by mouth at bedtime.    Marland Kitchen levothyroxine (SYNTHROID, LEVOTHROID) 50 MCG tablet Take 1 tablet (50 mcg total) by mouth at bedtime. 90 tablet 1  . PSYLLIUM  PO Take by mouth. 100%    . rosuvastatin (CRESTOR) 10 MG tablet Take 1 tablet (10 mg total) by mouth daily. 90 tablet 1   No current facility-administered medications for this visit.     Review of Systems Review of Systems  Constitutional: Negative.   Respiratory: Negative.   Cardiovascular: Negative.     Blood pressure 138/72, pulse 80, resp. rate 14, height 5' (1.524 m), weight 195 lb (88.5 kg).  Physical Exam Physical Exam  Constitutional: She is oriented to person, place, and time. She appears well-developed and well-nourished.  Eyes: Conjunctivae are normal. No scleral icterus.  Pulmonary/Chest: Effort normal and breath sounds normal. Right breast exhibits no inverted nipple, no mass, no nipple discharge, no skin change and no tenderness. Left breast exhibits no inverted nipple, no mass, no nipple discharge, no skin change and no tenderness.  Lymphadenopathy:    She has no cervical adenopathy.    She has no axillary adenopathy.  Neurological: She is alert and oriented to person, place, and time.  Skin: Skin is warm and dry.    Data Reviewed Bilateral mammograms dated 06/09/2016 were reviewed. Indeterminate microcalcifications in the right breast, BI-RADS-4.  Review of the  July 2016, January 2017 in July 2017 film shows no interval change. Prior study for comparison 2012.  Assessment    Stable right breast microcalcifications, from 2012. No interval change over 12 months.  Recent TIA resume being managed with Plavix.    Plan    With her recent CNS event would not plan on doing her for operating Plavix therapy for at least 6 months which would put [place] to September. Without any interval change over the first 12 months of observation and to reasonable to continue observation with a follow-up mammogram in 6 months.  The patient reports that the radiologist took quite a bit time telling her that she had to have the procedure completed.    Patient to follow up in January  2018 for a right breast diagnostic mammogram.   This information has been scribed by Sarah Freeman, CMA      Sarah Freeman 06/28/2016, 12:40 PM

## 2016-08-11 ENCOUNTER — Telehealth: Payer: Self-pay

## 2016-08-11 NOTE — Telephone Encounter (Signed)
Opened in error

## 2016-09-01 ENCOUNTER — Encounter: Payer: Self-pay | Admitting: Unknown Physician Specialty

## 2016-09-01 ENCOUNTER — Ambulatory Visit (INDEPENDENT_AMBULATORY_CARE_PROVIDER_SITE_OTHER): Payer: Medicare Other | Admitting: Unknown Physician Specialty

## 2016-09-01 DIAGNOSIS — K5901 Slow transit constipation: Secondary | ICD-10-CM | POA: Diagnosis not present

## 2016-09-01 DIAGNOSIS — K5900 Constipation, unspecified: Secondary | ICD-10-CM | POA: Insufficient documentation

## 2016-09-01 DIAGNOSIS — N951 Menopausal and female climacteric states: Secondary | ICD-10-CM

## 2016-09-01 DIAGNOSIS — E7800 Pure hypercholesterolemia, unspecified: Secondary | ICD-10-CM | POA: Diagnosis not present

## 2016-09-01 MED ORDER — ESTROGENS CONJUGATED 0.3 MG PO TABS: 0.3000 mg | ORAL_TABLET | Freq: Every day | ORAL | 3 refills | Status: DC

## 2016-09-01 MED ORDER — LEVOTHYROXINE SODIUM 50 MCG PO TABS: 50.0000 ug | ORAL_TABLET | Freq: Every day | ORAL | 1 refills | Status: DC

## 2016-09-01 MED ORDER — CLOPIDOGREL BISULFATE 75 MG PO TABS: 75.0000 mg | ORAL_TABLET | Freq: Every day | ORAL | 1 refills | Status: DC

## 2016-09-01 MED ORDER — SIMVASTATIN 20 MG PO TABS: 20.0000 mg | ORAL_TABLET | Freq: Every day | ORAL | 1 refills | Status: DC

## 2016-09-01 MED ORDER — ESOMEPRAZOLE MAGNESIUM 20 MG PO CPDR: 20.0000 mg | DELAYED_RELEASE_CAPSULE | Freq: Every day | ORAL | 1 refills | Status: DC

## 2016-09-01 NOTE — Assessment & Plan Note (Signed)
Stop Crestor.  Start Zocor after 2 weeks

## 2016-09-01 NOTE — Assessment & Plan Note (Signed)
Add stool softeners.  Diet changes

## 2016-09-01 NOTE — Assessment & Plan Note (Signed)
Restart Premarin at pt request.  Risk benefits discussed

## 2016-09-01 NOTE — Progress Notes (Signed)
BP 128/82 (BP Location: Left Arm, Cuff Size: Large)   Pulse 80   Temp 98 F (36.7 C)   Ht 4' 11.2" (1.504 m)   Wt 195 lb 6.4 oz (88.6 kg)   SpO2 98%   BMI 39.20 kg/m    Subjective:    Patient ID: Sarah Freeman, female    DOB: 27-Jul-1931, 82 y.o.   MRN: [id number]  HPI: Sarah Freeman is a 82 y.o. female  Chief Complaint  Patient presents with  . Hyperlipidemia  . Hypothyroidism  . Medication Problem    pt states that ever since she started plavix and crestor, her legs have been cramping    "I'm in terrible shape."  States she is having cramps and her legs hurt all the time since she got her medications.    No Premarin because of breast issues.  She is being followed every 6 months.  She would like to restart Premarin due to hot flashes and understands the risks.    Constipation: States she takes Metamucil daily States she is having problems with hard stools.     Relevant past medical, surgical, family and social history reviewed and updated as indicated. Interim medical history since our last visit reviewed. Allergies and medications reviewed and updated.  Review of Systems  Per HPI unless specifically indicated above     Objective:    BP 128/82 (BP Location: Left Arm, Cuff Size: Large)   Pulse 80   Temp 98 F (36.7 C)   Ht 4' 11.2" (1.504 m)   Wt 195 lb 6.4 oz (88.6 kg)   SpO2 98%   BMI 39.20 kg/m   Wt Readings from Last 3 Encounters:  09/01/16 195 lb 6.4 oz (88.6 kg)  06/28/16 195 lb (88.5 kg)  03/30/16 196 lb 3.2 oz (89 kg)    Physical Exam  Constitutional: She is oriented to person, place, and time. She appears well-developed and well-nourished. No distress.  HENT:  Head: Normocephalic and atraumatic.  Eyes: Conjunctivae and lids are normal. Right eye exhibits no discharge. Left eye exhibits no discharge. No scleral icterus.  Neck: Normal range of motion. Neck supple. No JVD present. Carotid bruit is not present.  Cardiovascular: Normal rate,  regular rhythm and normal heart sounds.   Pulmonary/Chest: Effort normal and breath sounds normal.  Abdominal: Normal appearance. There is no splenomegaly or hepatomegaly.  Musculoskeletal: Normal range of motion.  Neurological: She is alert and oriented to person, place, and time.  Skin: Skin is warm, dry and intact. No rash noted. No pallor.  Psychiatric: She has a normal mood and affect. Her behavior is normal. Judgment and thought content normal.    Results for orders placed or performed in visit on 03/30/16  HM DEXA SCAN  Result Value Ref Range   HM Dexa Scan from PP       Assessment & Plan:   Problem List Items Addressed This Visit      Unprioritized   Constipation    Add stool softeners.  Diet changes      Hypercholesterolemia    Stop Crestor.  Start Zocor after 2 weeks      Relevant Medications   simvastatin (ZOCOR) 20 MG tablet   Menopausal state    Restart Premarin at pt request.  Risk benefits discussed       Other Visit Diagnoses   None.      Follow up plan: Return in about 3 months (around 12/02/2016).

## 2016-09-20 ENCOUNTER — Encounter: Payer: Self-pay | Admitting: Unknown Physician Specialty

## 2016-09-20 ENCOUNTER — Ambulatory Visit (INDEPENDENT_AMBULATORY_CARE_PROVIDER_SITE_OTHER): Payer: Medicare Other | Admitting: Unknown Physician Specialty

## 2016-09-20 DIAGNOSIS — E7800 Pure hypercholesterolemia, unspecified: Secondary | ICD-10-CM | POA: Diagnosis not present

## 2016-09-20 MED ORDER — ATORVASTATIN CALCIUM 10 MG PO TABS: 10.0000 mg | ORAL_TABLET | Freq: Every day | ORAL | 1 refills | Status: DC

## 2016-09-20 NOTE — Assessment & Plan Note (Signed)
Intolerant to Crestor and Zocor.  Start Atorvastatin

## 2016-09-20 NOTE — Patient Instructions (Addendum)
Cholesterol Cholesterol is a white, waxy, fat-like substance needed by your body in small amounts. The liver makes all the cholesterol you need. Cholesterol is carried from the liver by the blood through the blood vessels. Deposits of cholesterol (plaque) may build up on blood vessel walls. These make the arteries narrower and stiffer. Cholesterol plaques increase the risk for heart attack and stroke.  You cannot feel your cholesterol level even if it is very high. The only way to know it is high is with a blood test. Once you know your cholesterol levels, you should keep a record of the test results. Work with your health care provider to keep your levels in the desired range.  WHAT DO THE RESULTS MEAN?  Total cholesterol is a rough measure of all the cholesterol in your blood.   LDL is the so-called bad cholesterol. This is the type that deposits cholesterol in the walls of the arteries. You want this level to be low.   HDL is the good cholesterol because it cleans the arteries and carries the LDL away. You want this level to be high.  Triglycerides are fat that the body can either burn for energy or store. High levels are closely linked to heart disease.  WHAT ARE THE DESIRED LEVELS OF CHOLESTEROL?  Total cholesterol below 200.   LDL below 100 for people at risk, below 70 for those at very high risk.   HDL above 50 is good, above 60 is best.   Triglycerides below 150.  HOW CAN I LOWER MY CHOLESTEROL?  Diet. Follow your diet programs as directed by your health care provider.   Choose fish or white meat chicken and [place], roasted or baked. Limit fatty cuts of red meat, fried foods, and processed meats, such as sausage and lunch meats.   Eat lots of fresh fruits and vegetables.  Choose whole grains, beans, pasta, potatoes, and cereals.   Use only small amounts of olive, corn, or canola oils.   Avoid butter, mayonnaise, shortening, or palm kernel oils.  Avoid foods with  trans fats.   Drink skim or nonfat milk and eat low-fat or nonfat yogurt and cheeses. Avoid whole milk, cream, ice cream, egg yolks, and full-fat cheeses.   Healthy desserts include angel food cake, ginger snaps, animal crackers, hard candy, popsicles, and low-fat or nonfat frozen yogurt. Avoid pastries, cakes, pies, and cookies.   Exercise. Follow your exercise programs as directed by your health care provider.   A regular program helps decrease LDL and raise HDL.   A regular program helps with weight control.   Do things that increase your activity level like gardening, walking, or taking the stairs. Ask your health care provider about how you can be more active in your daily life.   Medicine. Take medicine only as directed by your health care provider.   Medicine may be prescribed by your health care provider to help lower cholesterol and decrease the risk for heart disease.   If you have several risk factors, you may need medicine even if your levels are normal.   This information is not intended to replace advice given to you by your health care provider. Make sure you discuss any questions you have with your health care provider.   Try to choose a high fiber diet.  Metamucil helps.

## 2016-09-20 NOTE — Progress Notes (Signed)
   BP 123/80 (BP Location: Left Arm, Patient Position: Sitting, Cuff Size: Large)   Pulse 78   Temp 97.8 F (36.6 C)   Ht 4' 11.3" (1.506 m)   Wt 195 lb 9.6 oz (88.7 kg)   SpO2 97%   BMI 39.11 kg/m    Subjective:    Patient ID: Sarah Freeman, female    DOB: [birth year], 82 y.o.   MRN: [id number]  HPI: Sarah Freeman is a 82 y.o. female  Chief Complaint  Patient presents with  . Leg Pain    pt states she has still been having achey legs since starting simvastatin, states she did have a knee injection back in September   States she is having leg pain once restarting her Zocor.  "I don't think I can take those medications."  Unfortunately, she is in a high risk category dur to history of a stroke.    Relevant past medical, surgical, family and social history reviewed and updated as indicated. Interim medical history since our last visit reviewed. Allergies and medications reviewed and updated.  Review of Systems  Per HPI unless specifically indicated above     Objective:    BP 123/80 (BP Location: Left Arm, Patient Position: Sitting, Cuff Size: Large)   Pulse 78   Temp 97.8 F (36.6 C)   Ht 4' 11.3" (1.506 m)   Wt 195 lb 9.6 oz (88.7 kg)   SpO2 97%   BMI 39.11 kg/m   Wt Readings from Last 3 Encounters:  09/20/16 195 lb 9.6 oz (88.7 kg)  09/01/16 195 lb 6.4 oz (88.6 kg)  06/28/16 195 lb (88.5 kg)    Physical Exam  Constitutional: She is oriented to person, place, and time. She appears well-developed and well-nourished. No distress.  HENT:  Head: Normocephalic and atraumatic.  Eyes: Conjunctivae and lids are normal. Right eye exhibits no discharge. Left eye exhibits no discharge. No scleral icterus.  Cardiovascular: Normal rate.   Pulmonary/Chest: Effort normal.  Abdominal: Normal appearance. There is no splenomegaly or hepatomegaly.  Musculoskeletal: Normal range of motion.  Neurological: She is alert and oriented to person, place, and time.  Skin: Skin is  intact. No rash noted. No pallor.  Psychiatric: She has a normal mood and affect. Her behavior is normal. Judgment and thought content normal.    Results for orders placed or performed in visit on 03/30/16  HM DEXA SCAN  Result Value Ref Range   HM Dexa Scan from PP       Assessment & Plan:   Problem List Items Addressed This Visit      Unprioritized   Hypercholesterolemia    Intolerant to Crestor and Zocor.  Start Atorvastatin      Relevant Medications   atorvastatin (LIPITOR) 10 MG tablet    Other Visit Diagnoses   None.      Follow up plan: Return in about 6 weeks (around 11/01/2016) for fasting levels of cholesterol and CPK.

## 2016-09-21 ENCOUNTER — Telehealth: Payer: Self-pay | Admitting: Unknown Physician Specialty

## 2016-09-21 MED ORDER — CLOPIDOGREL BISULFATE 75 MG PO TABS: 75.0000 mg | ORAL_TABLET | Freq: Every day | ORAL | 0 refills | Status: DC

## 2016-09-21 NOTE — Telephone Encounter (Signed)
Prescription was in chart so I called it in to [facility] for the patient. I asked if the medication would be sent out today because the patient is completely out. The lady I was speaking with said no and that it would take 8 to 10 days. She stated that the patient could get up to a 14 day supply at a local pharmacy. Will call patient and let her know.

## 2016-09-21 NOTE — Telephone Encounter (Signed)
Pt called stated she needs a refill on Plavix. Pt is completely out. Please send ASAP. Pharm is [facility] nurse. Please put a rush on it. Please call pt once RX is sent. Thanks.

## 2016-09-21 NOTE — Telephone Encounter (Signed)
Called and spoke to patient. I let her know about medications and she would like a 14 day supply sent into [facility] to get to her mail order. [name]- please send a 14 day supply of plavix to [facility] for patient. [facility] stated that no more than 14 days can be sent in at a time.

## 2016-10-18 ENCOUNTER — Other Ambulatory Visit: Payer: Self-pay

## 2016-10-18 DIAGNOSIS — R920 Mammographic microcalcification found on diagnostic imaging of breast: Secondary | ICD-10-CM

## 2016-11-03 ENCOUNTER — Encounter: Payer: Self-pay | Admitting: Unknown Physician Specialty

## 2016-11-03 ENCOUNTER — Ambulatory Visit (INDEPENDENT_AMBULATORY_CARE_PROVIDER_SITE_OTHER): Payer: Medicare Other | Admitting: Unknown Physician Specialty

## 2016-11-03 VITALS — BP 134/83 | HR 81 | Temp 98.1°F | Wt 191.2 lb

## 2016-11-03 DIAGNOSIS — Z5181 Encounter for therapeutic drug level monitoring: Secondary | ICD-10-CM

## 2016-11-03 DIAGNOSIS — R102 Pelvic and perineal pain: Secondary | ICD-10-CM

## 2016-11-03 DIAGNOSIS — M79604 Pain in right leg: Secondary | ICD-10-CM | POA: Insufficient documentation

## 2016-11-03 DIAGNOSIS — M79605 Pain in left leg: Secondary | ICD-10-CM

## 2016-11-03 DIAGNOSIS — E7800 Pure hypercholesterolemia, unspecified: Secondary | ICD-10-CM

## 2016-11-03 DIAGNOSIS — N393 Stress incontinence (female) (male): Secondary | ICD-10-CM | POA: Diagnosis not present

## 2016-11-03 NOTE — Progress Notes (Signed)
BP 134/83 (BP Location: Left Arm, Patient Position: Sitting, Cuff Size: Large)   Pulse 81   Temp 98.1 F (36.7 C)   Wt 191 lb 3.2 oz (86.7 kg)   SpO2 97%   BMI 38.23 kg/m    Subjective:    Patient ID: Sarah Freeman, female    DOB: 1931-08-27, 82 y.o.   MRN: [id number]  HPI: Sarah Freeman is a 82 y.o. female  Chief Complaint  Patient presents with  . Hyperlipidemia    6 week f/up with fasting labs   . Labs Only    pt states she would like her kidneys checked    Hyperlipidemia Unable to tolerate Crestor Zocor, or Lipitor due to left leg pain but has cramps.  She last took her Atorvastatin on 10/23.  However, she continues to have leg pain.  She does have a history of stroke.    Pelvic pain Pt states she is aching in her lower abdomen started when she started cholesterol medications.  Urinary leakage and frequency.  No precipitating or aggravating factors.   Past Medical History:  Diagnosis Date  . Hypothyroid   . Stroke [facility])    Past Surgical History:  Procedure Laterality Date  . ABDOMINAL HYSTERECTOMY     age 73  . BREAST BIOPSY Left 2000?   neg. dr. Dwyane Luo office  . CHOLECYSTECTOMY  1964  . COLONOSCOPY     Dr Nicolasa Ducking  . DILATION AND CURETTAGE OF UTERUS    . EYE SURGERY        Relevant past medical, surgical, family and social history reviewed and updated as indicated. Interim medical history since our last visit reviewed. Allergies and medications reviewed and updated.  Review of Systems  Per HPI unless specifically indicated above     Objective:    BP 134/83 (BP Location: Left Arm, Patient Position: Sitting, Cuff Size: Large)   Pulse 81   Temp 98.1 F (36.7 C)   Wt 191 lb 3.2 oz (86.7 kg)   SpO2 97%   BMI 38.23 kg/m   Wt Readings from Last 3 Encounters:  11/03/16 191 lb 3.2 oz (86.7 kg)  09/20/16 195 lb 9.6 oz (88.7 kg)  09/01/16 195 lb 6.4 oz (88.6 kg)    Physical Exam  Constitutional: She is oriented to person, place, and time. She  appears well-developed and well-nourished. No distress.  HENT:  Head: Normocephalic and atraumatic.  Eyes: Conjunctivae and lids are normal. Right eye exhibits no discharge. Left eye exhibits no discharge. No scleral icterus.  Neck: Normal range of motion. Neck supple. No JVD present. Carotid bruit is not present.  Cardiovascular: Normal rate, regular rhythm and normal heart sounds.   Pulmonary/Chest: Effort normal and breath sounds normal.  Abdominal: Normal appearance. There is no splenomegaly or hepatomegaly.  Musculoskeletal: Normal range of motion.  Neurological: She is alert and oriented to person, place, and time.  Skin: Skin is warm, dry and intact. No rash noted. No pallor.  Psychiatric: She has a normal mood and affect. Her behavior is normal. Judgment and thought content normal.     Assessment & Plan:   Problem List Items Addressed This Visit      Unprioritized   Hypercholesterolemia - Primary    High due to history.  Feels she is statin intolerent and is refusing.  Discussed levels      Relevant Orders   Lipid Panel Piccolo, Waived   Leg pain    Seems to be muscular.  Recommended PT which she refused.  Demonstrated exercises      Stress incontinence    Other Visit Diagnoses    Medication monitoring encounter       Relevant Orders   Comprehensive metabolic panel   CK Total (and CKMB)   Pelvic pain       Await C&S   Relevant Orders   UA/M w/rflx Culture, Routine       Follow up plan: Return in about 6 months (around 05/04/2017) for physical.

## 2016-11-03 NOTE — Patient Instructions (Addendum)

## 2016-11-03 NOTE — Assessment & Plan Note (Addendum)
High due to history.  Feels she is statin intolerent and is refusing.  Discussed levels

## 2016-11-03 NOTE — Assessment & Plan Note (Signed)
Seems to be muscular.  Recommended PT which she refused.  Demonstrated exercises

## 2016-11-04 LAB — COMPREHENSIVE METABOLIC PANEL
A/G RATIO: 1.3 (ref 1.2–2.2)
ALT: 18 IU/L (ref 0–32)
AST: 17 IU/L (ref 0–40)
Albumin: 3.8 g/dL (ref 3.5–4.7)
Alkaline Phosphatase: 51 IU/L (ref 39–117)
BILIRUBIN TOTAL: 0.4 mg/dL (ref 0.0–1.2)
BUN/Creatinine Ratio: 10 — ABNORMAL LOW (ref 12–28)
BUN: 9 mg/dL (ref 8–27)
CHLORIDE: 101 mmol/L (ref 96–106)
CO2: 26 mmol/L (ref 18–29)
Calcium: 9.2 mg/dL (ref 8.7–10.3)
Creatinine, Ser: 0.87 mg/dL (ref 0.57–1.00)
GFR calc Af Amer: 71 mL/min/{1.73_m2} (ref >59)
GFR calc non Af Amer: 62 mL/min/{1.73_m2} (ref >59)
Globulin, Total: 2.9 g/dL (ref 1.5–4.5)
Glucose: 91 mg/dL (ref 65–99)
POTASSIUM: 4.8 mmol/L (ref 3.5–5.2)
Sodium: 140 mmol/L (ref 134–144)
Total Protein: 6.7 g/dL (ref 6.0–8.5)

## 2016-11-04 LAB — URINE CULTURE, REFLEX: ORGANISM ID, BACTERIA: NO GROWTH

## 2016-11-04 LAB — UA/M W/RFLX CULTURE, ROUTINE
BILIRUBIN UA: NEGATIVE
Glucose, UA: NEGATIVE
KETONES UA: NEGATIVE
NITRITE UA: NEGATIVE
Protein, UA: NEGATIVE
Specific Gravity, UA: 1.01 (ref 1.005–1.030)
UUROB: 0.2 mg/dL (ref 0.2–1.0)
pH, UA: 5.5 (ref 5.0–7.5)

## 2016-11-04 LAB — MICROSCOPIC EXAMINATION: RBC MICROSCOPIC, UA: NONE SEEN /HPF (ref 0–?)

## 2016-11-04 LAB — CK TOTAL AND CKMB (NOT AT ARMC)
CK MB INDEX: 1.8 ng/mL (ref 0.0–5.3)
Total CK: 74 U/L (ref 24–173)

## 2016-11-04 LAB — LIPID PANEL PICCOLO, WAIVED
Chol/HDL Ratio Piccolo,Waive: 3.4 mg/dL
Cholesterol Piccolo, Waived: 223 mg/dL — ABNORMAL HIGH (ref <200)
HDL CHOL PICCOLO, WAIVED: 66 mg/dL (ref >59)
LDL Chol Calc Piccolo Waived: 121 mg/dL — ABNORMAL HIGH (ref <100)
TRIGLYCERIDES PICCOLO,WAIVED: 180 mg/dL — AB (ref <150)
VLDL Chol Calc Piccolo,Waive: 36 mg/dL — ABNORMAL HIGH (ref <30)

## 2016-12-06 ENCOUNTER — Ambulatory Visit: Payer: Medicare Other | Admitting: Unknown Physician Specialty

## 2016-12-20 ENCOUNTER — Ambulatory Visit
Admission: RE | Admit: 2016-12-20 | Discharge: 2016-12-20 | Disposition: A | Discharge status: Home / Self Care | Payer: Medicare Other | Source: Ambulatory Visit | Attending: General Surgery | Admitting: General Surgery

## 2016-12-20 ENCOUNTER — Other Ambulatory Visit: Payer: Self-pay | Admitting: General Surgery

## 2016-12-20 DIAGNOSIS — R920 Mammographic microcalcification found on diagnostic imaging of breast: Secondary | ICD-10-CM

## 2016-12-28 ENCOUNTER — Ambulatory Visit (INDEPENDENT_AMBULATORY_CARE_PROVIDER_SITE_OTHER): Payer: Medicare Other | Admitting: General Surgery

## 2016-12-28 ENCOUNTER — Encounter: Payer: Self-pay | Admitting: General Surgery

## 2016-12-28 VITALS — BP 124/62 | HR 80 | Resp 14 | Ht 60.0 in | Wt 195.0 lb

## 2016-12-28 DIAGNOSIS — R920 Mammographic microcalcification found on diagnostic imaging of breast: Secondary | ICD-10-CM | POA: Diagnosis not present

## 2016-12-28 NOTE — Patient Instructions (Signed)
The patient is aware to call back for any questions or concerns.  

## 2016-12-28 NOTE — Progress Notes (Signed)
Patient ID: Sarah Freeman, female   DOB: 01-15-1931, 82 y.o.   MRN: [id number]  Chief Complaint  Patient presents with  . Follow-up    HPI Sarah Freeman is a 83 y.o. female who presents for a breast evaluation. The most recent right mammogram was done on 12/20/2016. No new stroke issues. Patient does perform regular self breast checks and gets regular mammograms done.  No new breast issues. The patient reports that she has had no more episodes as occurred last year when she was diagnosed with a TIA. At that time she had a 15 second spell where her thumb and index finger on the left hand were removable with a recurrence of following day. She was hospitalized at that time and apparently evaluation did not identify a source. She's been maintained on Plavix since that time. HPI  Past Medical History:  Diagnosis Date  . Hypothyroid   . Stroke [facility])     Past Surgical History:  Procedure Laterality Date  . ABDOMINAL HYSTERECTOMY     age 72  . BREAST BIOPSY Left 2000?   neg. dr. Dwyane Luo office  . CHOLECYSTECTOMY  1964  . COLONOSCOPY     Dr Nicolasa Ducking  . DILATION AND CURETTAGE OF UTERUS    . EYE SURGERY      Family History  Problem Relation Age of Onset  . Cancer Father     prostate  . Stroke Father   . Hypertension Sister   . Hyperlipidemia Daughter   . Heart disease Son   . Diabetes Maternal Grandmother   . Heart disease Maternal Grandmother   . Heart disease Maternal Grandfather   . Heart disease Daughter   . Breast cancer Neg Hx     Social History Social History  Substance Use Topics  . Smoking status: Never Smoker  . Smokeless tobacco: Never Used  . Alcohol use No    Allergies  Allergen Reactions  . Neosporin [Neomycin-Polymyxin-Gramicidin] Other (See Comments)    Pt states wounds do not heal with neosporin    Current Outpatient Prescriptions  Medication Sig Dispense Refill  . acetaminophen (TYLENOL ARTHRITIS PAIN) 650 MG CR tablet Take 650 mg by mouth every  8 (eight) hours as needed for pain.    Marland Kitchen clopidogrel (PLAVIX) 75 MG tablet Take 1 tablet (75 mg total) by mouth daily. 14 tablet 0  . diphenhydramine-acetaminophen (TYLENOL PM) 25-500 MG TABS tablet Take 1 tablet by mouth at bedtime.    Marland Kitchen esomeprazole (NEXIUM) 20 MG capsule Take 1 capsule (20 mg total) by mouth daily at 12 noon. (Patient taking differently: Take 20 mg by mouth daily at 12 noon. ) 90 capsule 1  . levothyroxine (SYNTHROID, LEVOTHROID) 50 MCG tablet Take 1 tablet (50 mcg total) by mouth at bedtime. 90 tablet 1  . PSYLLIUM PO Take 6 tablets by mouth at bedtime. 100%      No current facility-administered medications for this visit.     Review of Systems Review of Systems  Constitutional: Negative.   Respiratory: Negative.   Cardiovascular: Negative.     Blood pressure 124/62, pulse 80, resp. rate 14, height 5' (1.524 m), weight 195 lb (88.5 kg).  Physical Exam Physical Exam  Constitutional: She is oriented to person, place, and time. She appears well-developed and well-nourished.  HENT:  Mouth/Throat: Oropharynx is clear and moist.  Eyes: Conjunctivae are normal. No scleral icterus.  Neck: Neck supple.  Cardiovascular: Normal rate, regular rhythm and normal heart sounds.   Pulmonary/Chest: Effort  normal and breath sounds normal. Right breast exhibits no inverted nipple, no mass, no nipple discharge, no skin change and no tenderness. Left breast exhibits no inverted nipple, no mass, no nipple discharge, no skin change and no tenderness.  Lymphadenopathy:    She has no cervical adenopathy.    She has no axillary adenopathy.  Neurological: She is alert and oriented to person, place, and time.  Skin: Skin is warm and dry.  Psychiatric: Her behavior is normal.    Data Reviewed Right breast mammogram dated 12/20/2016 was reviewed and compared to studies dating back to 2016. Stable area of microcalcifications in the central portion of the breast. Stereotactic biopsy again  recommended. BI-RADS-4.  Assessment    Stable microcalcifications over 18 months.    Plan    Options for management were reviewed: 1) no further imaging studies (patient says she is "tired of this" versus 26 month follow-up bilateral diagnostic mammogram versus 3) stereotactic biopsy off Plavix.  We reviewed the pros and cons of each. At this time she is not willing to consider biopsy even though it would be reasonable to discontinue Plavix during the short interval of time.    Follow up in 6 months with Bilateral diagnostic mammogram and office visit.  This information has been scribed by Karie Fetch RN, BSN,BC.    Robert Bellow 12/28/2016, 11:27 AM

## 2017-02-22 ENCOUNTER — Ambulatory Visit (INDEPENDENT_AMBULATORY_CARE_PROVIDER_SITE_OTHER): Payer: Medicare Other | Admitting: Unknown Physician Specialty

## 2017-02-22 ENCOUNTER — Encounter: Payer: Self-pay | Admitting: Unknown Physician Specialty

## 2017-02-22 VITALS — BP 135/81 | HR 77 | Temp 97.5°F | Ht 59.5 in | Wt 193.5 lb

## 2017-02-22 DIAGNOSIS — E039 Hypothyroidism, unspecified: Secondary | ICD-10-CM

## 2017-02-22 DIAGNOSIS — Z0000 Encounter for general adult medical examination without abnormal findings: Secondary | ICD-10-CM | POA: Diagnosis not present

## 2017-02-22 DIAGNOSIS — E7800 Pure hypercholesterolemia, unspecified: Secondary | ICD-10-CM

## 2017-02-22 DIAGNOSIS — Z79899 Other long term (current) drug therapy: Secondary | ICD-10-CM | POA: Diagnosis not present

## 2017-02-22 DIAGNOSIS — K219 Gastro-esophageal reflux disease without esophagitis: Secondary | ICD-10-CM

## 2017-02-22 DIAGNOSIS — Z7189 Other specified counseling: Secondary | ICD-10-CM | POA: Insufficient documentation

## 2017-02-22 NOTE — Progress Notes (Signed)
BP 135/81 (BP Location: Left Arm, Patient Position: Sitting, Cuff Size: Large)   Pulse 77   Temp 97.5 F (36.4 C)   Ht 4' 11.5" (1.511 m)   Wt 193 lb 8 oz (87.8 kg)   SpO2 98%   BMI 38.43 kg/m    Subjective:    Patient ID: Sarah Freeman, female    DOB: 12/27/1930, 83 y.o.   MRN: [id number]  HPI: Sarah Freeman is a 83 y.o. female  Chief Complaint  Patient presents with  . Medicare Wellness   Functional Status Survey: Is the patient deaf or have difficulty hearing?: No Does the patient have difficulty seeing, even when wearing glasses/contacts?: No Does the patient have difficulty concentrating, remembering, or making decisions?: No Does the patient have difficulty walking or climbing stairs?: No Does the patient have difficulty dressing or bathing?: No Does the patient have difficulty doing errands alone such as visiting a doctor's office or shopping?: No  Depression screen [facility] 2/9 02/22/2017 03/30/2016  Decreased Interest 0 0  Down, Depressed, Hopeless 0 0  PHQ - 2 Score 0 0  Altered sleeping 0 -  Tired, decreased energy 1 -  Change in appetite 0 -  Feeling bad or failure about yourself  0 -  Trouble concentrating 0 -  Moving slowly or fidgety/restless 0 -  Suicidal thoughts 0 -  PHQ-9 Score 1 -   Fall Risk  02/22/2017 03/30/2016  Falls in the past year? Yes No  Number falls in past yr: 1 -  Injury with Fall? No -   Hypothyroid No weight or energy changes.    GERD OTC Nexium.  This is stable.    Hypercholesterol Taking red yeast rice once a day.  She gets leg cramps with statins and is now taking red yeast rise.  Last total cholesterol is 223.  No problems with red yeast rice.    Social History   Social History  . Marital status: Widowed    Spouse name: N/A  . Number of children: N/A  . Years of education: N/A   Occupational History  . Not on file.   Social History Main Topics  . Smoking status: Never Smoker  . Smokeless tobacco: Never Used  .  Alcohol use No  . Drug use: No  . Sexual activity: No   Other Topics Concern  . Not on file   Social History Narrative  . No narrative on file   Family History  Problem Relation Age of Onset  . Cancer Father     prostate  . Stroke Father   . Hypertension Sister   . Hyperlipidemia Daughter   . Heart disease Son   . Diabetes Maternal Grandmother   . Heart disease Maternal Grandmother   . Heart disease Maternal Grandfather   . Heart disease Daughter   . Breast cancer Neg Hx    Past Medical History:  Diagnosis Date  . Hypothyroid   . Stroke [facility])    Past Surgical History:  Procedure Laterality Date  . ABDOMINAL HYSTERECTOMY     age 47  . BREAST BIOPSY Left 2000?   neg. dr. Dwyane Luo office  . CHOLECYSTECTOMY  1964  . COLONOSCOPY     Dr Nicolasa Ducking  . DILATION AND CURETTAGE OF UTERUS    . EYE SURGERY    . SKIN CANCER EXCISION     face    Relevant past medical, surgical, family and social history reviewed and updated as indicated. Interim medical history  since our last visit reviewed. Allergies and medications reviewed and updated.  Review of Systems  Per HPI unless specifically indicated above     Objective:    BP 135/81 (BP Location: Left Arm, Patient Position: Sitting, Cuff Size: Large)   Pulse 77   Temp 97.5 F (36.4 C)   Ht 4' 11.5" (1.511 m)   Wt 193 lb 8 oz (87.8 kg)   SpO2 98%   BMI 38.43 kg/m   Wt Readings from Last 3 Encounters:  02/22/17 193 lb 8 oz (87.8 kg)  12/28/16 195 lb (88.5 kg)  11/03/16 191 lb 3.2 oz (86.7 kg)    Physical Exam  Constitutional: She is oriented to person, place, and time. She appears well-developed and well-nourished.  HENT:  Head: Normocephalic and atraumatic.  Eyes: Pupils are equal, round, and reactive to light. Right eye exhibits no discharge. Left eye exhibits no discharge. No scleral icterus.  Neck: Normal range of motion. Neck supple. Carotid bruit is not present. No thyromegaly present.  Cardiovascular: Normal  rate, regular rhythm and normal heart sounds.  Exam reveals no gallop and no friction rub.   No murmur heard. Pulmonary/Chest: Effort normal and breath sounds normal. No respiratory distress. She has no wheezes. She has no rales.  Abdominal: Soft. Bowel sounds are normal. There is no tenderness. There is no rebound.  Genitourinary: No breast swelling, tenderness or discharge.  Musculoskeletal: Normal range of motion.  Lymphadenopathy:    She has no cervical adenopathy.  Neurological: She is alert and oriented to person, place, and time.  Skin: Skin is warm, dry and intact. No rash noted.  Psychiatric: She has a normal mood and affect. Her speech is normal and behavior is normal. Judgment and thought content normal. Cognition and memory are normal.    Results for orders placed or performed in visit on 11/03/16  Microscopic Examination  Result Value Ref Range   WBC, UA 11-30 (A) 0 - 5 /hpf   RBC, UA None seen 0 - 2 /hpf   Epithelial Cells (non renal) 0-10 0 - 10 /hpf   Bacteria, UA Moderate (A) None seen/Few  Comprehensive metabolic panel  Result Value Ref Range   Glucose 91 65 - 99 mg/dL   BUN 9 8 - 27 mg/dL   Creatinine, Ser 0.87 0.57 - 1.00 mg/dL   GFR calc non Af Amer 62 >59 mL/min/1.73   GFR calc Af Amer 71 >59 mL/min/1.73   BUN/Creatinine Ratio 10 (L) 12 - 28   Sodium 140 134 - 144 mmol/L   Potassium 4.8 3.5 - 5.2 mmol/L   Chloride 101 96 - 106 mmol/L   CO2 26 18 - 29 mmol/L   Calcium 9.2 8.7 - 10.3 mg/dL   Total Protein 6.7 6.0 - 8.5 g/dL   Albumin 3.8 3.5 - 4.7 g/dL   Globulin, Total 2.9 1.5 - 4.5 g/dL   Albumin/Globulin Ratio 1.3 1.2 - 2.2   Bilirubin Total 0.4 0.0 - 1.2 mg/dL   Alkaline Phosphatase 51 39 - 117 IU/L   AST 17 0 - 40 IU/L   ALT 18 0 - 32 IU/L  UA/M w/rflx Culture, Routine  Result Value Ref Range   Specific Gravity, UA 1.010 1.005 - 1.030   pH, UA 5.5 5.0 - 7.5   Color, UA Yellow Yellow   Appearance Ur Cloudy (A) Clear   Leukocytes, UA 3+ (A)  Negative   Protein, UA Negative Negative/Trace   Glucose, UA Negative Negative   Ketones, UA  Negative Negative   RBC, UA Trace (A) Negative   Bilirubin, UA Negative Negative   Urobilinogen, Ur 0.2 0.2 - 1.0 mg/dL   Nitrite, UA Negative Negative   Microscopic Examination See below:    Urinalysis Reflex Comment   Lipid Panel Piccolo, Waived  Result Value Ref Range   Cholesterol Piccolo, Waived 223 (H) <200 mg/dL   HDL Chol Piccolo, Waived 66 >59 mg/dL   Triglycerides Piccolo,Waived 180 (H) <150 mg/dL   Chol/HDL Ratio Piccolo,Waive 3.4 mg/dL   LDL Chol Calc Piccolo Waived 121 (H) <100 mg/dL   VLDL Chol Calc Piccolo,Waive 36 (H) <30 mg/dL  CK Total (and CKMB)  Result Value Ref Range   Total CK 74 24 - 173 U/L   CK-MB Index 1.8 0.0 - 5.3 ng/mL  Urine Culture, Routine  Result Value Ref Range   Urine Culture, Routine Final report    Urine Culture result 1 No growth       Assessment & Plan:   Problem List Items Addressed This Visit      Unprioritized   Counseling regarding advanced directives and goals of care    A voluntary discussion about advance care planning including the explanation and discussion of advance directives was extensively discussed  with the patient.  Explanation about the health care proxy and Living will was reviewed and packet with forms with explanation of how to fill them out was given.  During this discussion, the patient was able to identify a health care proxy as her daughter Jenny Reichmann and may fill out the paperwork required.  She has a living will.  Patient was offered a separate [place] visit for further assistance with forms.  She states she does not want to be put on any life support of any kind.  If she could be revived, she wants to.         GERD (gastroesophageal reflux disease)    Stable, continue present medications.        Hypercholesterolemia    Check lipid panel to see the effectiveness of red yeast rice.        Relevant Orders     Lipid Panel w/o Chol/HDL Ratio   Hypothyroidism    Check TSH and adjust medications if needed      Relevant Orders   TSH    Other Visit Diagnoses    Annual physical exam    -  Primary   Medication management       Relevant Orders   Comprehensive metabolic panel       Follow up plan: Return in about 6 months (around 08/25/2017).

## 2017-02-22 NOTE — Assessment & Plan Note (Signed)
Stable, continue present medications.   

## 2017-02-22 NOTE — Assessment & Plan Note (Signed)
Check TSH and adjust medications if needed

## 2017-02-22 NOTE — Assessment & Plan Note (Signed)
Check lipid panel to see the effectiveness of red yeast rice.

## 2017-02-22 NOTE — Assessment & Plan Note (Addendum)
A voluntary discussion about advance care planning including the explanation and discussion of advance directives was extensively discussed  with the patient.  Explanation about the health care proxy and Living will was reviewed and packet with forms with explanation of how to fill them out was given.  During this discussion, the patient was able to identify a health care proxy as her daughter Sarah Freeman and may fill out the paperwork required.  She has a living will.  Patient was offered a separate [place] visit for further assistance with forms.  She states she does not want to be put on any life support of any kind.  If she could be revived, she wants to.

## 2017-02-23 ENCOUNTER — Telehealth: Payer: Self-pay | Admitting: Unknown Physician Specialty

## 2017-02-23 LAB — LIPID PANEL W/O CHOL/HDL RATIO
CHOLESTEROL TOTAL: 173 mg/dL (ref 100–199)
HDL: 55 mg/dL (ref >39)
LDL CALC: 95 mg/dL (ref 0–99)
TRIGLYCERIDES: 114 mg/dL (ref 0–149)
VLDL Cholesterol Cal: 23 mg/dL (ref 5–40)

## 2017-02-23 LAB — COMPREHENSIVE METABOLIC PANEL
ALT: 11 IU/L (ref 0–32)
AST: 15 IU/L (ref 0–40)
Albumin/Globulin Ratio: 1.4 (ref 1.2–2.2)
Albumin: 3.8 g/dL (ref 3.5–4.7)
Alkaline Phosphatase: 56 IU/L (ref 39–117)
BUN/Creatinine Ratio: 10 — ABNORMAL LOW (ref 12–28)
BUN: 9 mg/dL (ref 8–27)
Bilirubin Total: 0.3 mg/dL (ref 0.0–1.2)
CO2: 23 mmol/L (ref 18–29)
CREATININE: 0.93 mg/dL (ref 0.57–1.00)
Calcium: 9 mg/dL (ref 8.7–10.3)
Chloride: 101 mmol/L (ref 96–106)
GFR calc Af Amer: 66 mL/min/{1.73_m2} (ref >59)
GFR calc non Af Amer: 57 mL/min/{1.73_m2} — ABNORMAL LOW (ref >59)
GLOBULIN, TOTAL: 2.8 g/dL (ref 1.5–4.5)
Glucose: 110 mg/dL — ABNORMAL HIGH (ref 65–99)
POTASSIUM: 4.3 mmol/L (ref 3.5–5.2)
SODIUM: 140 mmol/L (ref 134–144)
Total Protein: 6.6 g/dL (ref 6.0–8.5)

## 2017-02-23 LAB — TSH: TSH: 3.53 u[IU]/mL (ref 0.450–4.500)

## 2017-02-23 NOTE — Progress Notes (Signed)
Notified pt by mychart

## 2017-02-23 NOTE — Telephone Encounter (Signed)
Patient is wanting her results from her labs yesterday.  thanks

## 2017-02-23 NOTE — Telephone Encounter (Signed)
Called and let patient know what [name] said about her labs (documented under labs in chart). Patient asked for a copy to be placed up front for her to pick up so I did this for her.

## 2017-03-02 ENCOUNTER — Telehealth: Payer: Self-pay | Admitting: Unknown Physician Specialty

## 2017-03-02 NOTE — Telephone Encounter (Signed)
It looks like she needs to be seen.  Hopefully, that is where she is now

## 2017-03-02 NOTE — Telephone Encounter (Signed)
Tried calling patient back, no answer and no VM. Will try to call again later, will also route to provider so she is aware of patient's call.

## 2017-03-02 NOTE — Telephone Encounter (Signed)
Patient scheduled for tomorrow morning with Apolonio Schneiders.

## 2017-03-02 NOTE — Telephone Encounter (Signed)
Scheduled patient to see Apolonio Schneiders for tomorrow for possible UTI

## 2017-03-03 ENCOUNTER — Encounter: Payer: Self-pay | Admitting: Family Medicine

## 2017-03-03 ENCOUNTER — Ambulatory Visit (INDEPENDENT_AMBULATORY_CARE_PROVIDER_SITE_OTHER): Payer: Medicare Other | Admitting: Family Medicine

## 2017-03-03 VITALS — BP 123/77 | HR 81 | Temp 98.6°F | Wt 191.0 lb

## 2017-03-03 DIAGNOSIS — N390 Urinary tract infection, site not specified: Secondary | ICD-10-CM

## 2017-03-03 MED ORDER — SULFAMETHOXAZOLE-TRIMETHOPRIM 800-160 MG PO TABS: 1.0000 | ORAL_TABLET | Freq: Two times a day (BID) | ORAL | 0 refills | Status: DC

## 2017-03-03 NOTE — Patient Instructions (Signed)
Follow up as needed

## 2017-03-03 NOTE — Progress Notes (Signed)
   BP 123/77   Pulse 81   Temp 98.6 F (37 C)   Wt 191 lb (86.6 kg)   SpO2 98%   BMI 37.93 kg/m    Subjective:    Patient ID: Sarah Freeman, female    DOB: 1931-09-19, 83 y.o.   MRN: [id number]  HPI: Sarah Freeman is a 83 y.o. female  Chief Complaint  Patient presents with  . Urinary Tract Infection    Dysuria x 2 days, lower pelvic pain, frequency, small amount of urine at a time.    Patient presents with 2 day history of suprapubic tenderness, dyusuria, and urgency. Denies fever, chills, N/V, back pain, hematuria. Has not tried any OTC measures up to now.   Relevant past medical, surgical, family and social history reviewed and updated as indicated. Interim medical history since our last visit reviewed. Allergies and medications reviewed and updated.  Review of Systems  Constitutional: Negative.   HENT: Negative.   Eyes: Negative.   Respiratory: Negative.   Cardiovascular: Negative.   Gastrointestinal: Positive for abdominal pain.  Genitourinary: Positive for dysuria, frequency and urgency.  Musculoskeletal: Negative.   Skin: Negative.   Neurological: Negative.   Psychiatric/Behavioral: Negative.     Per HPI unless specifically indicated above     Objective:    BP 123/77   Pulse 81   Temp 98.6 F (37 C)   Wt 191 lb (86.6 kg)   SpO2 98%   BMI 37.93 kg/m   Wt Readings from Last 3 Encounters:  03/03/17 191 lb (86.6 kg)  02/22/17 193 lb 8 oz (87.8 kg)  12/28/16 195 lb (88.5 kg)    Physical Exam  Constitutional: She is oriented to person, place, and time. She appears well-developed and well-nourished.  HENT:  Head: Atraumatic.  Eyes: Conjunctivae are normal. Pupils are equal, round, and reactive to light.  Neck: Normal range of motion. Neck supple.  Cardiovascular: Normal rate and normal heart sounds.   Pulmonary/Chest: Effort normal and breath sounds normal. No respiratory distress.  Abdominal: Soft. Bowel sounds are normal. There is no tenderness.   Musculoskeletal: Normal range of motion.  No CVA tenderness  Neurological: She is alert and oriented to person, place, and time.  Skin: Skin is warm and dry. No rash noted.  Psychiatric: She has a normal mood and affect. Her behavior is normal.  Nursing note and vitals reviewed.     Assessment & Plan:   Problem List Items Addressed This Visit    None    Visit Diagnoses    Acute lower UTI (urinary tract infection)    -  Primary   U/A + for UTI, await cx. Will treat with bactrim. Recommended probiotic, AZO prn, push fluids, void fully and frequently. F/u if worsening or no improvement.    Relevant Medications   sulfamethoxazole-trimethoprim (BACTRIM DS,SEPTRA DS) 800-160 MG tablet   Other Relevant Orders   UA/M w/rflx Culture, Routine (STAT)      Follow up plan: Return for as scheduled.

## 2017-03-05 LAB — MICROSCOPIC EXAMINATION: WBC, UA: >30 /hpf — AB (ref 0–?)

## 2017-03-05 LAB — UA/M W/RFLX CULTURE, ROUTINE
BILIRUBIN UA: NEGATIVE
Glucose, UA: NEGATIVE
KETONES UA: NEGATIVE
Nitrite, UA: NEGATIVE
PH UA: 6 (ref 5.0–7.5)
Specific Gravity, UA: 1.015 (ref 1.005–1.030)
Urobilinogen, Ur: 0.2 mg/dL (ref 0.2–1.0)

## 2017-03-05 LAB — URINE CULTURE, REFLEX

## 2017-03-15 ENCOUNTER — Other Ambulatory Visit: Payer: Self-pay | Admitting: Unknown Physician Specialty

## 2017-03-17 ENCOUNTER — Telehealth: Payer: Self-pay | Admitting: Unknown Physician Specialty

## 2017-03-17 MED ORDER — CIPROFLOXACIN HCL 250 MG PO TABS: 250.0000 mg | ORAL_TABLET | Freq: Two times a day (BID) | ORAL | 0 refills | Status: DC

## 2017-03-17 NOTE — Telephone Encounter (Signed)
Routing to provider  

## 2017-03-17 NOTE — Telephone Encounter (Signed)
Cipro sent to her pharmacy. If no improvement, will need to come back for recheck

## 2017-03-17 NOTE — Telephone Encounter (Signed)
Patient was seen by Apolonio Schneiders 04/05 for UTI. She feels she has the UTI again and would like to know if something could be called in for it or does she have to be seen again.  Please advise. Thanks  [phone]   [facility] on [address] if something is called in

## 2017-03-18 NOTE — Telephone Encounter (Signed)
Called and let patient know that medication has been sent in for her. Also let her know that Apolonio Schneiders stated she would need to come in to be seen if no improvement after taking antibiotic.

## 2017-03-28 ENCOUNTER — Other Ambulatory Visit: Payer: Self-pay

## 2017-03-28 DIAGNOSIS — R920 Mammographic microcalcification found on diagnostic imaging of breast: Secondary | ICD-10-CM

## 2017-05-04 ENCOUNTER — Ambulatory Visit: Payer: Medicare Other | Admitting: Unknown Physician Specialty

## 2017-06-14 ENCOUNTER — Encounter: Payer: Self-pay | Admitting: Unknown Physician Specialty

## 2017-06-14 ENCOUNTER — Ambulatory Visit (INDEPENDENT_AMBULATORY_CARE_PROVIDER_SITE_OTHER): Payer: Medicare Other | Admitting: Unknown Physician Specialty

## 2017-06-14 VITALS — BP 113/75 | HR 82 | Temp 98.3°F | Wt 191.2 lb

## 2017-06-14 DIAGNOSIS — I63311 Cerebral infarction due to thrombosis of right middle cerebral artery: Secondary | ICD-10-CM

## 2017-06-14 DIAGNOSIS — Z789 Other specified health status: Secondary | ICD-10-CM | POA: Diagnosis not present

## 2017-06-14 DIAGNOSIS — Z8673 Personal history of transient ischemic attack (TIA), and cerebral infarction without residual deficits: Secondary | ICD-10-CM | POA: Diagnosis not present

## 2017-06-14 DIAGNOSIS — R531 Weakness: Secondary | ICD-10-CM | POA: Diagnosis not present

## 2017-06-14 DIAGNOSIS — E7800 Pure hypercholesterolemia, unspecified: Secondary | ICD-10-CM | POA: Diagnosis not present

## 2017-06-14 NOTE — Assessment & Plan Note (Addendum)
Taking red yeast rice.  LDL around 100.  Does meet the criteria for Repatha.  Discussed risk benefits.  She would like to not pursue at this time due to cost considerations.

## 2017-06-14 NOTE — Assessment & Plan Note (Addendum)
Discussed with pt about stroke risk reduction and Repatha. Consider referral to neurology

## 2017-06-14 NOTE — Progress Notes (Signed)
BP 113/75   Pulse 82   Temp 98.3 F (36.8 C)   Wt 191 lb 3.2 oz (86.7 kg)   SpO2 97%   BMI 37.97 kg/m    Subjective:    Patient ID: Sarah Freeman, female    DOB: 01-May-1931, 83 y.o.   MRN: [id number]  HPI: Sarah Freeman is a 83 y.o. female  Chief Complaint  Patient presents with  . Follow-up    3 month f/up per patient    Pt here with concerns on June 26th about symptoms she had when she woke up.  States she just "felt bad" and nothing specific.  No numbness, tingling, weakness, and able to move all extremities and able to talk.  No problems with vision and fixed lunch for 12 people that day.  She wanted to come in today due to concerns from her daughters that she might have a TIA though she says these symptoms don't feel like her last TIA.  She is taking Plavix daily.  States she came in today because of concerns of the family.    Of note, she is statin intolerant with history of muscle cramps from multiple medications but tolerates red yeast rice.  She was asking about Repatha which she saw on TV.   Relevant past medical, surgical, family and social history reviewed and updated as indicated. Interim medical history since our last visit reviewed. Allergies and medications reviewed and updated.  Review of Systems  Per HPI unless specifically indicated above     Objective:    BP 113/75   Pulse 82   Temp 98.3 F (36.8 C)   Wt 191 lb 3.2 oz (86.7 kg)   SpO2 97%   BMI 37.97 kg/m   Wt Readings from Last 3 Encounters:  06/14/17 191 lb 3.2 oz (86.7 kg)  03/03/17 191 lb (86.6 kg)  02/22/17 193 lb 8 oz (87.8 kg)    Physical Exam  Constitutional: She is oriented to person, place, and time. She appears well-developed and well-nourished. No distress.  HENT:  Head: Normocephalic and atraumatic.  Eyes: Conjunctivae and lids are normal. Right eye exhibits no discharge. Left eye exhibits no discharge. No scleral icterus.  Neck: Normal range of motion. Neck supple. No JVD  present. Carotid bruit is not present.  Cardiovascular: Normal rate, regular rhythm and normal heart sounds.   Pulmonary/Chest: Effort normal and breath sounds normal.  Abdominal: Normal appearance. There is no splenomegaly or hepatomegaly.  Musculoskeletal: Normal range of motion.  Neurological: She is alert and oriented to person, place, and time.  Skin: Skin is warm, dry and intact. No rash noted. No pallor.  Psychiatric: She has a normal mood and affect. Her behavior is normal. Judgment and thought content normal.   EKG shows normal sinus rhythm with no change from previous EKG.  Low voltage on precordial leads without change  Results for orders placed or performed in visit on 03/03/17  Microscopic Examination  Result Value Ref Range   WBC, UA >30 (A) 0 - 5 /hpf   RBC, UA 3-10 (A) 0 - 2 /hpf   Epithelial Cells (non renal) 0-10 0 - 10 /hpf   Bacteria, UA Few None seen/Few   Yeast, UA Present None seen  UA/M w/rflx Culture, Routine (STAT)  Result Value Ref Range   Specific Gravity, UA 1.015 1.005 - 1.030   pH, UA 6.0 5.0 - 7.5   Color, UA Yellow Yellow   Appearance Ur Cloudy (A) Clear  Leukocytes, UA 3+ (A) Negative   Protein, UA 2+ (A) Negative/Trace   Glucose, UA Negative Negative   Ketones, UA Negative Negative   RBC, UA 2+ (A) Negative   Bilirubin, UA Negative Negative   Urobilinogen, Ur 0.2 0.2 - 1.0 mg/dL   Nitrite, UA Negative Negative   Microscopic Examination See below:    Urinalysis Reflex Comment   Urine Culture, Routine  Result Value Ref Range   Urine Culture, Routine Final report (A)    Organism ID, Bacteria Escherichia coli (A)    Antimicrobial Susceptibility Comment       Assessment & Plan:   Problem List Items Addressed This Visit      Unprioritized   History of stroke    Discussed with pt about stroke risk reduction and Repatha. Consider referral to neurology      Hypercholesterolemia   Relevant Orders   Comprehensive metabolic panel   Lipid  Panel w/o Chol/HDL Ratio   Statin intolerance    Taking red yeast rice.  LDL around 100.  Does meet the criteria for Repatha.  Discussed risk benefits.  She would like to not pursue at this time due to cost considerations.        Relevant Orders   Comprehensive metabolic panel   Stroke (cerebrum) (HCC)    History of stroke       Other Visit Diagnoses    Weakness    -  Primary   No EKG changes   Relevant Orders   EKG 12-Lead (Completed)   Comprehensive metabolic panel       Follow up plan: Return in about 3 months (around 09/14/2017).

## 2017-06-14 NOTE — Assessment & Plan Note (Signed)
History of stroke 

## 2017-06-14 NOTE — Assessment & Plan Note (Deleted)
Discussed with pt about stroke risk reduction and Repatha.  I would refer her to neurology for further discuss risk reduction.

## 2017-06-15 LAB — LIPID PANEL W/O CHOL/HDL RATIO
Cholesterol, Total: 203 mg/dL — ABNORMAL HIGH (ref 100–199)
HDL: 54 mg/dL (ref >39)
LDL CALC: 121 mg/dL — AB (ref 0–99)
Triglycerides: 142 mg/dL (ref 0–149)
VLDL CHOLESTEROL CAL: 28 mg/dL (ref 5–40)

## 2017-06-15 LAB — COMPREHENSIVE METABOLIC PANEL
ALBUMIN: 3.9 g/dL (ref 3.5–4.7)
ALT: 16 IU/L (ref 0–32)
AST: 18 IU/L (ref 0–40)
Albumin/Globulin Ratio: 1.7 (ref 1.2–2.2)
Alkaline Phosphatase: 52 IU/L (ref 39–117)
BUN / CREAT RATIO: 11 — AB (ref 12–28)
BUN: 9 mg/dL (ref 8–27)
Bilirubin Total: 0.3 mg/dL (ref 0.0–1.2)
CO2: 24 mmol/L (ref 20–29)
CREATININE: 0.84 mg/dL (ref 0.57–1.00)
Calcium: 9.1 mg/dL (ref 8.7–10.3)
Chloride: 101 mmol/L (ref 96–106)
GFR calc Af Amer: 74 mL/min/{1.73_m2} (ref >59)
GFR, EST NON AFRICAN AMERICAN: 64 mL/min/{1.73_m2} (ref >59)
GLOBULIN, TOTAL: 2.3 g/dL (ref 1.5–4.5)
GLUCOSE: 108 mg/dL — AB (ref 65–99)
Potassium: 4.6 mmol/L (ref 3.5–5.2)
SODIUM: 140 mmol/L (ref 134–144)
Total Protein: 6.2 g/dL (ref 6.0–8.5)

## 2017-06-20 ENCOUNTER — Telehealth: Payer: Self-pay | Admitting: Unknown Physician Specialty

## 2017-06-20 ENCOUNTER — Ambulatory Visit
Admission: RE | Admit: 2017-06-20 | Discharge: 2017-06-20 | Disposition: A | Discharge status: Home / Self Care | Payer: Medicare Other | Source: Ambulatory Visit | Attending: General Surgery | Admitting: General Surgery

## 2017-06-20 DIAGNOSIS — R920 Mammographic microcalcification found on diagnostic imaging of breast: Secondary | ICD-10-CM | POA: Diagnosis present

## 2017-06-20 NOTE — Telephone Encounter (Signed)
Sarah Freeman, is there anything to tell this patient about her labs? I do not see where a letter was sent or a message was sent to mychart.

## 2017-06-20 NOTE — Telephone Encounter (Signed)
Patient called because she did not receive a call last week regarding her lab results. Patient stated she is stopping by this morning to pick up her lab results. Informed patient that Medical staff are seeing patients at the time and that we can have someone give her a call about her results.  Lab work results will be up front office for patient to pick up.   Please Advise.  Thank you

## 2017-06-20 NOTE — Telephone Encounter (Signed)
Pt came by to discuss labs.  I tried to call her about her earlier but no answer.  Discussed labs and elevated cholesterol and LDL not to goal with red yeast rice.  Statin intolerant.  Will explore prior authorization for Repatha

## 2017-06-20 NOTE — Telephone Encounter (Signed)
Discussed with pt  See note

## 2017-06-20 NOTE — Telephone Encounter (Deleted)
Patient stopped by to read results. Patient does not understand results and would like to speak with someone regarding the results

## 2017-06-23 MED ORDER — EVOLOCUMAB WITH INFUSOR 420 MG/3.5ML ~~LOC~~ SOCT: 420.0000 mg | SUBCUTANEOUS | 12 refills | Status: DC

## 2017-06-23 NOTE — Addendum Note (Signed)
Addended by: Kathrine Haddock on: 06/23/2017 01:43 PM   Modules accepted: Orders

## 2017-06-23 NOTE — Telephone Encounter (Signed)
I've done research and it does require a PA. Before starting a PA I need directions for use, dose, and quantity.

## 2017-06-24 NOTE — Telephone Encounter (Signed)
PA submitted and pending

## 2017-06-28 NOTE — Telephone Encounter (Signed)
What's the status on this PA?

## 2017-06-29 ENCOUNTER — Ambulatory Visit (INDEPENDENT_AMBULATORY_CARE_PROVIDER_SITE_OTHER): Payer: Medicare Other | Admitting: General Surgery

## 2017-06-29 ENCOUNTER — Encounter: Payer: Self-pay | Admitting: General Surgery

## 2017-06-29 ENCOUNTER — Encounter: Payer: Self-pay | Admitting: Unknown Physician Specialty

## 2017-06-29 VITALS — BP 100/68 | HR 76 | Resp 14 | Ht 60.0 in | Wt 190.0 lb

## 2017-06-29 DIAGNOSIS — R920 Mammographic microcalcification found on diagnostic imaging of breast: Secondary | ICD-10-CM | POA: Diagnosis not present

## 2017-06-29 NOTE — Telephone Encounter (Signed)
PA was denied. It says for not meeting PA requirements. We should get a fax as to why exactly.

## 2017-06-29 NOTE — Patient Instructions (Addendum)
The patient is aware to call back for any questions or concerns.  Patient will be asked to follow up with her PCP, Kathrine Haddock NP, for her annual bilateral screening mammogram.

## 2017-06-29 NOTE — Progress Notes (Signed)
Patient ID: Sarah Freeman, female   DOB: [birth year], 83 y.o.   MRN: [id number]  Chief Complaint  Patient presents with  . Follow-up    HPI Sarah Freeman is a 83 y.o. female.  who presents for a breast evaluation. The most recent mammogram was done on 06-20-17.  Patient does perform regular self breast checks and gets regular mammograms done.     HPI  Past Medical History:  Diagnosis Date  . Hypothyroid   . Stroke [facility])     Past Surgical History:  Procedure Laterality Date  . ABDOMINAL HYSTERECTOMY     age 43  . BREAST BIOPSY Left 2000?   neg. dr. Dwyane Luo office  . CHOLECYSTECTOMY  1964  . COLONOSCOPY     Dr Nicolasa Ducking  . DILATION AND CURETTAGE OF UTERUS    . EYE SURGERY    . SKIN CANCER EXCISION     face    Family History  Problem Relation Age of Onset  . Cancer Father        prostate  . Stroke Father   . Hypertension Sister   . Hyperlipidemia Daughter   . Heart disease Son   . Diabetes Maternal Grandmother   . Heart disease Maternal Grandmother   . Heart disease Maternal Grandfather   . Heart disease Daughter   . Breast cancer Neg Hx     Social History Social History  Substance Use Topics  . Smoking status: Never Smoker  . Smokeless tobacco: Never Used  . Alcohol use No    Allergies  Allergen Reactions  . Neosporin [Neomycin-Polymyxin-Gramicidin] Other (See Comments)    Pt states wounds do not heal with neosporin  . Statins Other (See Comments)    Leg cramps    Current Outpatient Prescriptions  Medication Sig Dispense Refill  . acetaminophen (TYLENOL ARTHRITIS PAIN) 650 MG CR tablet Take 650 mg by mouth every 8 (eight) hours as needed for pain.    Marland Kitchen clopidogrel (PLAVIX) 75 MG tablet TAKE 1 TABLET DAILY 90 tablet 1  . diphenhydramine-acetaminophen (TYLENOL PM) 25-500 MG TABS tablet Take 1 tablet by mouth at bedtime.    Marland Kitchen esomeprazole (NEXIUM) 20 MG capsule TAKE 1 CAPSULE DAILY AT 12 NOON 90 capsule 1  . levothyroxine (SYNTHROID, LEVOTHROID) 50  MCG tablet TAKE 1 TABLET AT BEDTIME 90 tablet 1  . PSYLLIUM PO Take 6 tablets by mouth at bedtime. 100%     . Red Yeast Rice Extract (RED YEAST RICE PO) Take 1 tablet by mouth daily.     No current facility-administered medications for this visit.     Review of Systems Review of Systems  Constitutional: Negative.   Respiratory: Negative.   Cardiovascular: Negative.     Blood pressure 100/68, pulse 76, resp. rate 14, height 5' (1.524 m), weight 190 lb (86.2 kg).  Physical Exam Physical Exam  Constitutional: She is oriented to person, place, and time. She appears well-developed and well-nourished.  HENT:  Mouth/Throat: Oropharynx is clear and moist.  Eyes: Conjunctivae are normal. No scleral icterus.  Neck: Neck supple. Carotid bruit is not present.  Cardiovascular: Normal rate, regular rhythm and normal heart sounds.   Pulmonary/Chest: Effort normal and breath sounds normal. Right breast exhibits no inverted nipple, no mass, no nipple discharge, no skin change and no tenderness. Left breast exhibits no inverted nipple, no mass, no nipple discharge, no skin change and no tenderness.  Lymphadenopathy:    She has no cervical adenopathy.    She has no  axillary adenopathy.  Neurological: She is alert and oriented to person, place, and time.  Skin: Skin is warm and dry.  Psychiatric: Her behavior is normal.    Data Reviewed Bilateral diagnostic mammograms dated 06/20/2017 were reviewed. No interval change in the previously identified 11 mm group of calcifications in the upper right breast over the past 2 years. BI-RADS-2.  Assessment    Unremarkable breast exam.  Stable right breast microcalcifications.    Plan         Patient will be asked to follow up with her PCP, Kathrine Haddock NP, for her annual bilateral screening mammogram.   HPI, Physical Exam, Assessment and Plan have been scribed under the direction and in the presence of Robert Bellow, MD. Karie Fetch,  RN  I have completed the exam and reviewed the above documentation for accuracy and completeness.  I agree with the above.  Haematologist has been used and any errors in dictation or transcription are unintentional.  Hervey Ard, M.D., F.A.C.S.  Robert Bellow 06/29/2017, 9:32 PM

## 2017-07-01 NOTE — Telephone Encounter (Signed)
Patient notified. She states that she received a letter about this. She states that repatha is considered a statin and she could not take it anyway as she is intolerant to statins.

## 2017-07-01 NOTE — Telephone Encounter (Signed)
Just let her know

## 2017-08-03 ENCOUNTER — Telehealth: Payer: Self-pay

## 2017-08-03 NOTE — Telephone Encounter (Signed)
OK. Thanks.

## 2017-08-03 NOTE — Telephone Encounter (Signed)
It is not a statin.

## 2017-08-03 NOTE — Telephone Encounter (Signed)
Called patient to get prescription card to complete P.A. Pt would not give informations. Stated she is not going to take the medication because she heard it was a statin. Reiterated to patient that it was indeed not a statin. Pt stated she was doing fine and wasn't going to take it either way.

## 2017-08-03 NOTE — Telephone Encounter (Signed)
Patient was recently prescribed repatha for statin intolerance for hyperlipidemia. PA was done and denied. Almyra Free, the repatha rep, was wanting me to resubmit the PA with additional information because they believe that the medication should be approved. I called the patient to ask her if she had a different prescription card because it looks like the PA was done through the patient's medical card. Patient states that she cannot take this medication anyway because she states that it is a statin, which she is intolerant to. [name] is this information correct? If not, should we try to proceed with another PA for repatha?

## 2017-08-19 ENCOUNTER — Ambulatory Visit: Payer: Medicare Other | Admitting: Unknown Physician Specialty

## 2017-09-14 ENCOUNTER — Other Ambulatory Visit: Payer: Self-pay | Admitting: Unknown Physician Specialty

## 2017-09-14 MED ORDER — CLOPIDOGREL BISULFATE 75 MG PO TABS: 75.0000 mg | ORAL_TABLET | Freq: Every day | ORAL | 0 refills | Status: DC

## 2017-09-14 MED ORDER — LEVOTHYROXINE SODIUM 50 MCG PO TABS: 50.0000 ug | ORAL_TABLET | Freq: Every day | ORAL | 0 refills | Status: DC

## 2017-09-14 MED ORDER — ESOMEPRAZOLE MAGNESIUM 20 MG PO CPDR
DELAYED_RELEASE_CAPSULE | ORAL | 0 refills | Status: DC
Start: 2017-09-14 — End: 2017-12-23

## 2017-09-14 NOTE — Telephone Encounter (Signed)
Routing to provider. Patient last seen 05/2017 and has appointment 09/2017.

## 2017-09-14 NOTE — Telephone Encounter (Signed)
Patient needs her thyroxine synthroid, nexium,plavix needs scripts sent to [facility] all generic forms.  Thank you

## 2017-09-26 ENCOUNTER — Ambulatory Visit: Payer: Medicare Other | Admitting: Unknown Physician Specialty

## 2017-10-05 ENCOUNTER — Ambulatory Visit (INDEPENDENT_AMBULATORY_CARE_PROVIDER_SITE_OTHER): Payer: Medicare Other | Admitting: Unknown Physician Specialty

## 2017-10-05 ENCOUNTER — Encounter: Payer: Self-pay | Admitting: Unknown Physician Specialty

## 2017-10-05 VITALS — BP 122/80 | HR 76 | Temp 98.4°F | Wt 187.6 lb

## 2017-10-05 DIAGNOSIS — Z23 Encounter for immunization: Secondary | ICD-10-CM

## 2017-10-05 DIAGNOSIS — E039 Hypothyroidism, unspecified: Secondary | ICD-10-CM | POA: Diagnosis not present

## 2017-10-05 DIAGNOSIS — E7800 Pure hypercholesterolemia, unspecified: Secondary | ICD-10-CM

## 2017-10-05 DIAGNOSIS — N393 Stress incontinence (female) (male): Secondary | ICD-10-CM | POA: Diagnosis not present

## 2017-10-05 DIAGNOSIS — N3001 Acute cystitis with hematuria: Secondary | ICD-10-CM

## 2017-10-05 DIAGNOSIS — R350 Frequency of micturition: Secondary | ICD-10-CM | POA: Diagnosis not present

## 2017-10-05 MED ORDER — NITROFURANTOIN MONOHYD MACRO 100 MG PO CAPS: 100.0000 mg | ORAL_CAPSULE | Freq: Two times a day (BID) | ORAL | 0 refills | Status: AC

## 2017-10-05 NOTE — Assessment & Plan Note (Signed)
Refer to Urology.

## 2017-10-05 NOTE — Assessment & Plan Note (Addendum)
Statin intolerant.  Taking Red Yeast Rice. Refusing Repatha.  Check Lipid panel

## 2017-10-05 NOTE — Assessment & Plan Note (Signed)
Check TSH today

## 2017-10-05 NOTE — Patient Instructions (Addendum)
Pneumococcal Polysaccharide Vaccine: What You Need to Know 1. Why get vaccinated? Vaccination can protect older adults (and some children and younger adults) from pneumococcal disease. Pneumococcal disease is caused by bacteria that can spread from person to person through close contact. It can cause ear infections, and it can also lead to more serious infections of the:  Lungs (pneumonia),  Blood (bacteremia), and  Covering of the brain and spinal cord (meningitis). Meningitis can cause deafness and brain damage, and it can be fatal.  Anyone can get pneumococcal disease, but children under 52 years of age, people with certain medical conditions, adults over 104 years of age, and cigarette smokers are at the highest risk. About 18,000 older adults die each year from pneumococcal disease in the [place]. Treatment of pneumococcal infections with penicillin and other drugs used to be more effective. But some strains of the disease have become resistant to these drugs. This makes prevention of the disease, through vaccination, even more important. 2. Pneumococcal polysaccharide vaccine (PPSV23) Pneumococcal polysaccharide vaccine (PPSV23) protects against 23 types of pneumococcal bacteria. It will not prevent all pneumococcal disease. PPSV23 is recommended for:  All adults 31 years of age and older,  Anyone 2 through 81 years of age with certain long-term health problems,  Anyone 2 through 81 years of age with a weakened immune system,  Adults 35 through 81 years of age who smoke cigarettes or have asthma.  Most people need only one dose of PPSV. A second dose is recommended for certain high-risk groups. People 73 and older should get a dose even if they have gotten one or more doses of the vaccine before they turned 65. Your healthcare provider can give you more information about these recommendations. Most healthy adults develop protection within 2 to 3 weeks of getting the shot. 3.  Some people should not get this vaccine  Anyone who has had a life-threatening allergic reaction to PPSV should not get another dose.  Anyone who has a severe allergy to any component of PPSV should not receive it. Tell your provider if you have any severe allergies.  Anyone who is moderately or severely ill when the shot is scheduled may be asked to wait until they recover before getting the vaccine. Someone with a mild illness can usually be vaccinated.  Children less than 64 years of age should not receive this vaccine.  There is no evidence that PPSV is harmful to either a pregnant woman or to her fetus. However, as a precaution, women who need the vaccine should be vaccinated before becoming pregnant, if possible. 4. Risks of a vaccine reaction With any medicine, including vaccines, there is a chance of side effects. These are usually mild and go away on their own, but serious reactions are also possible. About half of people who get PPSV have mild side effects, such as redness or pain where the shot is given, which go away within about two days. Less than 1 out of 100 people develop a fever, muscle aches, or more severe local reactions. Problems that could happen after any vaccine:  People sometimes faint after a medical procedure, including vaccination. Sitting or lying down for about 15 minutes can help prevent fainting, and injuries caused by a fall. Tell your doctor if you feel dizzy, or have vision changes or ringing in the ears.  Some people get severe pain in the shoulder and have difficulty moving the arm where a shot was given. This happens very rarely.  Any  medication can cause a severe allergic reaction. Such reactions from a vaccine are very rare, estimated at about 1 in a million doses, and would happen within a few minutes to a few hours after the vaccination. As with any medicine, there is a very remote chance of a vaccine causing a serious injury or death. The safety of  vaccines is always being monitored. For more information, visit: http://www.aguilar.org/ 5. What if there is a serious reaction? What should I look for? Look for anything that concerns you, such as signs of a severe allergic reaction, very high fever, or unusual behavior. Signs of a severe allergic reaction can include hives, swelling of the face and throat, difficulty breathing, a fast heartbeat, dizziness, and weakness. These would usually start a few minutes to a few hours after the vaccination. What should I do? If you think it is a severe allergic reaction or other emergency that can't wait, call 9-1-1 or get to the nearest hospital. Otherwise, call your doctor. Afterward, the reaction should be reported to the Vaccine Adverse Event Reporting System (VAERS). Your doctor might file this report, or you can do it yourself through the VAERS web site at www.vaers.SamedayNews.es, or by calling 873-168-4603. VAERS does not give medical advice. 6. How can I learn more?  Ask your doctor. He or she can give you the vaccine package insert or suggest other sources of information.  Call your local or state health department.  Contact the Centers for Disease Control and Prevention (CDC): ? Call (712)740-2354 (1-800-CDC-INFO) or ? Visit CDC's website at http://hunter.com/ CDC Pneumococcal Polysaccharide Vaccine VIS (03/22/14) This information is not intended to replace advice given to you by your health care provider. Make sure you discuss any questions you have with your health care provider. Document Released: 09/12/2006 Document Revised: 08/05/2016 Document Reviewed: 08/05/2016 Elsevier Interactive Patient Education  2017 [place]. Influenza (Flu) Vaccine (Inactivated or Recombinant): What You Need to Know 1. Why get vaccinated? Influenza ("flu") is a contagious disease that spreads around the [place] every year, usually between October and May. Flu is caused by influenza viruses, and is  spread mainly by coughing, sneezing, and close contact. Anyone can get flu. Flu strikes suddenly and can last several days. Symptoms vary by age, but can include:  fever/chills  sore throat  muscle aches  fatigue  cough  headache  runny or stuffy nose  Flu can also lead to pneumonia and blood infections, and cause diarrhea and seizures in children. If you have a medical condition, such as heart or lung disease, flu can make it worse. Flu is more dangerous for some people. Infants and young children, people 61 years of age and older, pregnant women, and people with certain health conditions or a weakened immune system are at greatest risk. Each year thousands of people in the [place] die from flu, and many more are hospitalized. Flu vaccine can:  keep you from getting flu,  make flu less severe if you do get it, and  keep you from spreading flu to your family and other people. 2. Inactivated and recombinant flu vaccines A dose of flu vaccine is recommended every flu season. Children 6 months through 75 years of age may need two doses during the same flu season. Everyone else needs only one dose each flu season. Some inactivated flu vaccines contain a very small amount of a mercury-based preservative called thimerosal. Studies have not shown thimerosal in vaccines to be harmful, but flu vaccines that do  not contain thimerosal are available. There is no live flu virus in flu shots. They cannot cause the flu. There are many flu viruses, and they are always changing. Each year a new flu vaccine is made to protect against three or four viruses that are likely to cause disease in the upcoming flu season. But even when the vaccine doesn't exactly match these viruses, it may still provide some protection. Flu vaccine cannot prevent:  flu that is caused by a virus not covered by the vaccine, or  illnesses that look like flu but are not.  It takes about 2 weeks for protection to  develop after vaccination, and protection lasts through the flu season. 3. Some people should not get this vaccine Tell the person who is giving you the vaccine:  If you have any severe, life-threatening allergies. If you ever had a life-threatening allergic reaction after a dose of flu vaccine, or have a severe allergy to any part of this vaccine, you may be advised not to get vaccinated. Most, but not all, types of flu vaccine contain a small amount of egg protein.  If you ever had Guillain-Barr Syndrome (also called GBS). Some people with a history of GBS should not get this vaccine. This should be discussed with your doctor.  If you are not feeling well. It is usually okay to get flu vaccine when you have a mild illness, but you might be asked to come back when you feel better.  4. Risks of a vaccine reaction With any medicine, including vaccines, there is a chance of reactions. These are usually mild and go away on their own, but serious reactions are also possible. Most people who get a flu shot do not have any problems with it. Minor problems following a flu shot include:  soreness, redness, or swelling where the shot was given  hoarseness  sore, red or itchy eyes  cough  fever  aches  headache  itching  fatigue  If these problems occur, they usually begin soon after the shot and last 1 or 2 days. More serious problems following a flu shot can include the following:  There may be a small increased risk of Guillain-Barre Syndrome (GBS) after inactivated flu vaccine. This risk has been estimated at 1 or 2 additional cases per million people vaccinated. This is much lower than the risk of severe complications from flu, which can be prevented by flu vaccine.  Young children who get the flu shot along with pneumococcal vaccine (PCV13) and/or DTaP vaccine at the same time might be slightly more likely to have a seizure caused by fever. Ask your doctor for more information.  Tell your doctor if a child who is getting flu vaccine has ever had a seizure.  Problems that could happen after any injected vaccine:  People sometimes faint after a medical procedure, including vaccination. Sitting or lying down for about 15 minutes can help prevent fainting, and injuries caused by a fall. Tell your doctor if you feel dizzy, or have vision changes or ringing in the ears.  Some people get severe pain in the shoulder and have difficulty moving the arm where a shot was given. This happens very rarely.  Any medication can cause a severe allergic reaction. Such reactions from a vaccine are very rare, estimated at about 1 in a million doses, and would happen within a few minutes to a few hours after the vaccination. As with any medicine, there is a very remote chance of a  vaccine causing a serious injury or death. The safety of vaccines is always being monitored. For more information, visit: www.cdc.gov/vaccinesafety/ 5. What if there is a serious reaction? What should I look for? Look for anything that concerns you, such as signs of a severe allergic reaction, very high fever, or unusual behavior. Signs of a severe allergic reaction can include hives, swelling of the face and throat, difficulty breathing, a fast heartbeat, dizziness, and weakness. These would start a few minutes to a few hours after the vaccination. What should I do?  If you think it is a severe allergic reaction or other emergency that can't wait, call 9-1-1 and get the person to the nearest hospital. Otherwise, call your doctor.  Reactions should be reported to the Vaccine Adverse Event Reporting System (VAERS). Your doctor should file this report, or you can do it yourself through the VAERS web site at www.vaers.hhs.gov, or by calling 1-800-822-7967. ? VAERS does not give medical advice. 6. The National Vaccine Injury Compensation Program The National Vaccine Injury Compensation Program (VICP) is a federal  program that was created to compensate people who may have been injured by certain vaccines. Persons who believe they may have been injured by a vaccine can learn about the program and about filing a claim by calling 1-800-338-2382 or visiting the VICP website at www.hrsa.gov/vaccinecompensation. There is a time limit to file a claim for compensation. 7. How can I learn more?  Ask your healthcare provider. He or she can give you the vaccine package insert or suggest other sources of information.  Call your local or state health department.  Contact the Centers for Disease Control and Prevention (CDC): ? Call 1-800-232-4636 (1-800-CDC-INFO) or ? Visit CDC's website at www.cdc.gov/flu Vaccine Information Statement, Inactivated Influenza Vaccine (07/05/2014) This information is not intended to replace advice given to you by your health care provider. Make sure you discuss any questions you have with your health care provider. Document Released: 09/09/2006 Document Revised: 08/05/2016 Document Reviewed: 08/05/2016 Elsevier Interactive Patient Education  2017 Elsevier Inc.  

## 2017-10-05 NOTE — Progress Notes (Signed)
BP 122/80   Pulse 76   Temp 98.4 F (36.9 C) (Oral)   Wt 187 lb 9.6 oz (85.1 kg)   SpO2 98%   BMI 36.64 kg/m    Subjective:    Patient ID: Sarah Freeman, female    DOB: [birth year], 84 y.o.   MRN: [id number]  HPI: Sarah Freeman is a 83 y.o. female  Chief Complaint  Patient presents with  . Hyperlipidemia  . Hypothyroidism  . Referral    pt states she would like a referral to go see Dr. Erlene Quan at [facility] she is linking and using a pad.  Stays irritated and doesn't know if it is related to moisture.  This has been going on throughout her life and would like a referral to Urology.  No new symptoms.    Hypothyroid Energy levels seem to be doing fine.  No significant weight changes.    Hyperlipidemia Statin intolerant.  Taking Red Yeast Rice.    Relevant past medical, surgical, family and social history reviewed and updated as indicated. Interim medical history since our last visit reviewed. Allergies and medications reviewed and updated.  Review of Systems  Constitutional: Negative.   HENT: Negative.   Eyes: Negative.   Respiratory: Negative.   Cardiovascular: Negative.   Gastrointestinal: Negative.   Endocrine: Negative.   Genitourinary: Negative.   Musculoskeletal: Negative.   Skin: Negative.   Allergic/Immunologic: Negative.   Neurological: Negative.   Hematological: Negative.   Psychiatric/Behavioral: Negative.     Per HPI unless specifically indicated above     Objective:    BP 122/80   Pulse 76   Temp 98.4 F (36.9 C) (Oral)   Wt 187 lb 9.6 oz (85.1 kg)   SpO2 98%   BMI 36.64 kg/m   Wt Readings from Last 3 Encounters:  10/05/17 187 lb 9.6 oz (85.1 kg)  06/29/17 190 lb (86.2 kg)  06/14/17 191 lb 3.2 oz (86.7 kg)    Physical Exam  Constitutional: She is oriented to person, place, and time. She appears well-developed and well-nourished. No distress.  HENT:  Head: Normocephalic and atraumatic.  Eyes:  Conjunctivae and lids are normal. Right eye exhibits no discharge. Left eye exhibits no discharge. No scleral icterus.  Neck: Normal range of motion. Neck supple. No JVD present. Carotid bruit is not present.  Cardiovascular: Normal rate, regular rhythm and normal heart sounds.  Pulmonary/Chest: Effort normal and breath sounds normal.  Abdominal: Normal appearance. There is no splenomegaly or hepatomegaly.  Musculoskeletal: Normal range of motion.  Neurological: She is alert and oriented to person, place, and time.  Skin: Skin is warm, dry and intact. No rash noted. No pallor.  Psychiatric: She has a normal mood and affect. Her behavior is normal. Judgment and thought content normal.     Assessment & Plan:   Problem List Items Addressed This Visit      Unprioritized   Hypercholesterolemia    Statin intolerant.  Taking Red Yeast Rice. Refusing Repatha.  Check Lipid panel      Relevant Orders   Comprehensive metabolic panel   Lipid Panel w/o Chol/HDL Ratio   Hypothyroidism    Check TSH today      Relevant Orders   TSH   Stress incontinence    Refer to Urology      Relevant Medications   nitrofurantoin, macrocrystal-monohydrate, (MACROBID) 100 MG capsule   Other Relevant Orders   Ambulatory referral to Urology  Other Visit Diagnoses    Need for influenza vaccination    -  Primary   Relevant Orders   Flu vaccine HIGH DOSE PF   Need for pneumococcal vaccination       Relevant Orders   Pneumococcal polysaccharide vaccine 23-valent greater than or equal to 2yo subcutaneous/IM   Urinary frequency       Relevant Orders   UA/M w/rflx Culture, Routine   Acute cystitis with hematuria       Urine is positive. Plus RBCs.  Treat with Macrobid bid.  F/U with Urology   Relevant Orders   Ambulatory referral to Urology       Follow up plan: Return in about 1 year (around 10/05/2018).

## 2017-10-06 LAB — COMPREHENSIVE METABOLIC PANEL
ALBUMIN: 3.8 g/dL (ref 3.5–4.7)
ALK PHOS: 54 IU/L (ref 39–117)
ALT: 16 IU/L (ref 0–32)
AST: 16 IU/L (ref 0–40)
Albumin/Globulin Ratio: 1.5 (ref 1.2–2.2)
BILIRUBIN TOTAL: 0.2 mg/dL (ref 0.0–1.2)
BUN / CREAT RATIO: 9 — AB (ref 12–28)
BUN: 9 mg/dL (ref 8–27)
CHLORIDE: 101 mmol/L (ref 96–106)
CO2: 25 mmol/L (ref 20–29)
Calcium: 9.4 mg/dL (ref 8.7–10.3)
Creatinine, Ser: 1.01 mg/dL — ABNORMAL HIGH (ref 0.57–1.00)
GFR calc Af Amer: 59 mL/min/{1.73_m2} — ABNORMAL LOW (ref >59)
GFR calc non Af Amer: 51 mL/min/{1.73_m2} — ABNORMAL LOW (ref >59)
GLUCOSE: 112 mg/dL — AB (ref 65–99)
Globulin, Total: 2.6 g/dL (ref 1.5–4.5)
Potassium: 4.9 mmol/L (ref 3.5–5.2)
Sodium: 138 mmol/L (ref 134–144)
Total Protein: 6.4 g/dL (ref 6.0–8.5)

## 2017-10-06 LAB — LIPID PANEL W/O CHOL/HDL RATIO
CHOLESTEROL TOTAL: 198 mg/dL (ref 100–199)
HDL: 49 mg/dL (ref >39)
LDL Calculated: 115 mg/dL — ABNORMAL HIGH (ref 0–99)
TRIGLYCERIDES: 170 mg/dL — AB (ref 0–149)
VLDL CHOLESTEROL CAL: 34 mg/dL (ref 5–40)

## 2017-10-06 LAB — TSH: TSH: 4.52 u[IU]/mL — ABNORMAL HIGH (ref 0.450–4.500)

## 2017-10-07 ENCOUNTER — Telehealth: Payer: Self-pay | Admitting: Unknown Physician Specialty

## 2017-10-07 NOTE — Telephone Encounter (Signed)
Discussed UTI.  Will let pt know if Macrobid  Is not correct

## 2017-10-08 LAB — UA/M W/RFLX CULTURE, ROUTINE
Bilirubin, UA: NEGATIVE
Glucose, UA: NEGATIVE
Ketones, UA: NEGATIVE
Nitrite, UA: NEGATIVE
Protein, UA: NEGATIVE
Specific Gravity, UA: 1.01 (ref 1.005–1.030)
Urobilinogen, Ur: 0.2 mg/dL (ref 0.2–1.0)
pH, UA: 5.5 (ref 5.0–7.5)

## 2017-10-08 LAB — MICROSCOPIC EXAMINATION

## 2017-10-08 LAB — URINE CULTURE, REFLEX

## 2017-10-24 ENCOUNTER — Encounter: Payer: Self-pay | Admitting: Urology

## 2017-10-24 ENCOUNTER — Ambulatory Visit (INDEPENDENT_AMBULATORY_CARE_PROVIDER_SITE_OTHER): Payer: Medicare Other | Admitting: Urology

## 2017-10-24 VITALS — BP 114/75 | HR 87 | Ht 60.0 in | Wt 187.2 lb

## 2017-10-24 DIAGNOSIS — N393 Stress incontinence (female) (male): Secondary | ICD-10-CM

## 2017-10-24 DIAGNOSIS — N3946 Mixed incontinence: Secondary | ICD-10-CM

## 2017-10-24 LAB — URINALYSIS, COMPLETE
BILIRUBIN UA: NEGATIVE
Glucose, UA: NEGATIVE
Ketones, UA: NEGATIVE
NITRITE UA: NEGATIVE
PH UA: 6.5 (ref 5.0–7.5)
PROTEIN UA: NEGATIVE
SPEC GRAV UA: 1.01 (ref 1.005–1.030)
UUROB: 0.2 mg/dL (ref 0.2–1.0)

## 2017-10-24 LAB — MICROSCOPIC EXAMINATION

## 2017-10-24 LAB — BLADDER SCAN AMB NON-IMAGING

## 2017-10-24 MED ORDER — FLUCONAZOLE 100 MG PO TABS: 100.0000 mg | ORAL_TABLET | Freq: Once | ORAL | 0 refills | Status: AC

## 2017-10-24 MED ORDER — MIRABEGRON ER 50 MG PO TB24: 50.0000 mg | ORAL_TABLET | Freq: Every day | ORAL | 2 refills | Status: DC

## 2017-10-24 NOTE — Progress Notes (Signed)
10/24/2017 9:55 AM   Sarah Freeman 30-May-1931 [id number]  Referring provider: Kathrine Haddock, NP [address], [place] [address]  Chief Complaint  Patient presents with  . Urinary Incontinence    HPI: I was consulted to assess the patient's worsening urinary incontinence over many months.  She may have a bladder infection now.  She leaks with coughing sneezing bending and lifting.  She has urge incontinence and no bedwetting.  She wears 1 pad a day.  She will wear 2 pads if active.  She soaks more urine if she has urgency.  She voids 4 or 5 times at night.  She voids every 2 hours during the day.  She does not take a diuretic and denies ankle edema  She had a TIA 1 year ago.  She has had a hysterectomy.  She gets one bladder infection the year and is prone to yeast infections.  She thinks she is still infected with her typical symptoms of burning itching and burning near the pubic bone.  She finds that pads are irritating to the introitus  Some of the details of her history were more difficult to ascertain  She is prone to constipation and takes a laxative.  She denies previous GU surgery and kidney stones.  She had a urine culture positive on October 05, 2017  Modifying factors: There are no other modifying factors  Associated signs and symptoms: There are no other associated signs and symptoms Aggravating and relieving factors: There are no other aggravating or relieving factors Severity: Moderate Duration: Persistent   PMH: Past Medical History:  Diagnosis Date  . Hypothyroid   . Stroke [facility])     Surgical History: Past Surgical History:  Procedure Laterality Date  . ABDOMINAL HYSTERECTOMY     age 52  . BREAST BIOPSY Left 2000?   neg. dr. Dwyane Luo office  . CHOLECYSTECTOMY  1964  . COLONOSCOPY     Dr Nicolasa Ducking  . DILATION AND CURETTAGE OF UTERUS    . EYE SURGERY    . SKIN CANCER EXCISION     face    Home Medications:  Allergies as of 10/24/2017      Reactions    Neosporin [neomycin-polymyxin-gramicidin] Other (See Comments)   Pt states wounds do not heal with neosporin   Statins Other (See Comments)   Leg cramps      Medication List        Accurate as of 10/24/17  9:55 AM. Always use your most recent med list.          clopidogrel 75 MG tablet Commonly known as:  PLAVIX Take 1 tablet (75 mg total) by mouth daily.   diphenhydramine-acetaminophen 25-500 MG Tabs tablet Commonly known as:  TYLENOL PM Take 1 tablet by mouth at bedtime.   esomeprazole 20 MG capsule Commonly known as:  NEXIUM TAKE 1 CAPSULE DAILY AT 12 NOON   levothyroxine 50 MCG tablet Commonly known as:  SYNTHROID, LEVOTHROID Take 1 tablet (50 mcg total) by mouth at bedtime.   PSYLLIUM PO Take 6 tablets by mouth at bedtime. 100%   RED YEAST RICE PO Take 1 tablet by mouth daily.   STOOL SOFTENER PO Take by mouth.   TYLENOL ARTHRITIS PAIN 650 MG CR tablet Generic drug:  acetaminophen Take 650 mg by mouth every 8 (eight) hours as needed for pain.       Allergies:  Allergies  Allergen Reactions  . Neosporin [Neomycin-Polymyxin-Gramicidin] Other (See Comments)    Pt states wounds  do not heal with neosporin  . Statins Other (See Comments)    Leg cramps    Family History: Family History  Problem Relation Age of Onset  . Cancer Father        prostate  . Stroke Father   . Hypertension Sister   . Hyperlipidemia Daughter   . Heart disease Son   . Diabetes Maternal Grandmother   . Heart disease Maternal Grandmother   . Heart disease Maternal Grandfather   . Heart disease Daughter   . Breast cancer Neg Hx     Social History:  reports that  has never smoked. she has never used smokeless tobacco. She reports that she does not drink alcohol or use drugs.  ROS: UROLOGY Frequent Urination?: Yes Hard to postpone urination?: Yes Burning/pain with urination?: Yes Get up at night to urinate?: Yes Leakage of urine?: Yes Urine stream starts and stops?:  No Trouble starting stream?: No Do you have to strain to urinate?: No Blood in urine?: No Urinary tract infection?: Yes Sexually transmitted disease?: No Injury to kidneys or bladder?: No Painful intercourse?: No Weak stream?: No Currently pregnant?: No Vaginal bleeding?: No Last menstrual period?: n  Gastrointestinal Nausea?: No Vomiting?: No Indigestion/heartburn?: No Diarrhea?: No Constipation?: Yes  Constitutional Fever: No Night sweats?: No Weight loss?: No Fatigue?: No  Skin Skin rash/lesions?: No Itching?: No  Eyes Blurred vision?: No Double vision?: No  Ears/Nose/Throat Sore throat?: No Sinus problems?: No  Hematologic/Lymphatic Swollen glands?: No Easy bruising?: No  Cardiovascular Leg swelling?: No Chest pain?: No  Respiratory Cough?: No Shortness of breath?: No  Endocrine Excessive thirst?: No  Musculoskeletal Back pain?: Yes Joint pain?: Yes  Neurological Headaches?: No Dizziness?: Yes  Psychologic Depression?: No Anxiety?: No  Physical Exam: BP 114/75 (BP Location: Right Arm, Patient Position: Sitting, Cuff Size: Large)   Pulse 87   Ht 5' (1.524 m)   Wt 187 lb 3.2 oz (84.9 kg)   BMI 36.56 kg/m   Constitutional:  Alert and oriented, No acute distress. HEENT: [place] AT, moist mucus membranes.  Trachea midline, no masses. Cardiovascular: No clubbing, cyanosis, or edema. Respiratory: Normal respiratory effort, no increased work of breathing. GI: Abdomen is soft, nontender, nondistended, no abdominal masses GU: No CVA tenderness.  Well supported bladder neck with no stress incontinence.  She had no significant prolapse.  It is difficult to say if she had a yeast infection because she has been using an ointment but she did have white discharge around the urethra and she was a bit tender Skin: No rashes, bruises or suspicious lesions. Lymph: No cervical or inguinal adenopathy. Neurologic: Grossly intact, no focal deficits, moving all 4  extremities. Psychiatric: Normal mood and affect.  Laboratory Data: Lab Results  Component Value Date   WBC 8.9 02/15/2016   HGB 13.5 02/15/2016   HCT 40.5 02/15/2016   MCV 96.5 02/15/2016   PLT 289 02/15/2016    Lab Results  Component Value Date   CREATININE 1.01 (H) 10/05/2017    No results found for: PSA  No results found for: TESTOSTERONE  Lab Results  Component Value Date   HGBA1C 5.8 02/15/2016    Urinalysis    Component Value Date/Time   COLORURINE STRAW (A) 02/15/2016 1645   APPEARANCEUR [place]y (A) 10/05/2017 0852   LABSPEC 1.004 (L) 02/15/2016 1645   PHURINE 7.0 02/15/2016 1645   GLUCOSEU Negative 10/05/2017 0852   HGBUR NEGATIVE 02/15/2016 1645   BILIRUBINUR Negative 10/05/2017 0852   [place] 02/15/2016  [place] Negative 10/05/2017 [place] 02/15/2016 1645   NITRITE Negative 10/05/2017 0852   NITRITE NEGATIVE 02/15/2016 1645   LEUKOCYTESUR 2+ (A) 10/05/2017 0852    Pertinent Imaging: none  Assessment & Plan: The patient has mixed incontinence.  She has significant nocturia.  She clinically may be infected.  Based upon the pelvic examination I gave her 3 days of Diflucan.  I sent the urine for culture.  I will call if positive.  I started her on Myrbetriq 50 mg samples and prescription.  I thought it was reasonable not to order cystoscopy or urodynamics yet.  I may cystoscope her and order an ultrasound if the culture is positive  1. Stress incontinence, female 2.   Chronic cystitis     - Urinalysis, Complete - Bladder Scan (Post Void Residual) in office   No Follow-up on file.  Reece Packer, MD  [facility] [address] [place], [place] [address] [phone]

## 2017-10-26 LAB — URINE CULTURE

## 2017-11-04 ENCOUNTER — Emergency Department: Payer: Medicare Other

## 2017-11-04 ENCOUNTER — Inpatient Hospital Stay
Admission: EM | Admit: 2017-11-04 | Discharge: 2017-11-07 | DRG: 872 | Disposition: A | Discharge status: Home / Self Care | Payer: Medicare Other | Attending: Internal Medicine | Admitting: Internal Medicine

## 2017-11-04 ENCOUNTER — Encounter: Payer: Self-pay | Admitting: Emergency Medicine

## 2017-11-04 ENCOUNTER — Telehealth: Payer: Self-pay | Admitting: Unknown Physician Specialty

## 2017-11-04 ENCOUNTER — Other Ambulatory Visit: Payer: Self-pay

## 2017-11-04 DIAGNOSIS — Z90710 Acquired absence of both cervix and uterus: Secondary | ICD-10-CM

## 2017-11-04 DIAGNOSIS — A4151 Sepsis due to Escherichia coli [E. coli]: Principal | ICD-10-CM | POA: Diagnosis present

## 2017-11-04 DIAGNOSIS — E039 Hypothyroidism, unspecified: Secondary | ICD-10-CM | POA: Diagnosis present

## 2017-11-04 DIAGNOSIS — Z7902 Long term (current) use of antithrombotics/antiplatelets: Secondary | ICD-10-CM

## 2017-11-04 DIAGNOSIS — Z8673 Personal history of transient ischemic attack (TIA), and cerebral infarction without residual deficits: Secondary | ICD-10-CM

## 2017-11-04 DIAGNOSIS — Z79899 Other long term (current) drug therapy: Secondary | ICD-10-CM | POA: Diagnosis not present

## 2017-11-04 DIAGNOSIS — N3001 Acute cystitis with hematuria: Secondary | ICD-10-CM | POA: Diagnosis present

## 2017-11-04 DIAGNOSIS — N390 Urinary tract infection, site not specified: Secondary | ICD-10-CM | POA: Diagnosis present

## 2017-11-04 DIAGNOSIS — M1990 Unspecified osteoarthritis, unspecified site: Secondary | ICD-10-CM | POA: Diagnosis present

## 2017-11-04 DIAGNOSIS — Z85828 Personal history of other malignant neoplasm of skin: Secondary | ICD-10-CM

## 2017-11-04 DIAGNOSIS — Z9049 Acquired absence of other specified parts of digestive tract: Secondary | ICD-10-CM

## 2017-11-04 LAB — COMPREHENSIVE METABOLIC PANEL
ALBUMIN: 3.5 g/dL (ref 3.5–5.0)
ALK PHOS: 61 U/L (ref 38–126)
ALT: 20 U/L (ref 14–54)
ANION GAP: 14 (ref 5–15)
AST: 31 U/L (ref 15–41)
BILIRUBIN TOTAL: 0.9 mg/dL (ref 0.3–1.2)
BUN: 16 mg/dL (ref 6–20)
CALCIUM: 8.6 mg/dL — AB (ref 8.9–10.3)
CO2: 20 mmol/L — ABNORMAL LOW (ref 22–32)
Chloride: 98 mmol/L — ABNORMAL LOW (ref 101–111)
Creatinine, Ser: 1.11 mg/dL — ABNORMAL HIGH (ref 0.44–1.00)
GFR calc Af Amer: 51 mL/min — ABNORMAL LOW (ref >60)
GFR calc non Af Amer: 44 mL/min — ABNORMAL LOW (ref >60)
GLUCOSE: 143 mg/dL — AB (ref 65–99)
Potassium: 4.3 mmol/L (ref 3.5–5.1)
Sodium: 132 mmol/L — ABNORMAL LOW (ref 135–145)
TOTAL PROTEIN: 7.1 g/dL (ref 6.5–8.1)

## 2017-11-04 LAB — URINALYSIS, COMPLETE (UACMP) WITH MICROSCOPIC
BILIRUBIN URINE: NEGATIVE
GLUCOSE, UA: NEGATIVE mg/dL
KETONES UR: 5 mg/dL — AB
NITRITE: NEGATIVE
PH: 5 (ref 5.0–8.0)
PROTEIN: 100 mg/dL — AB
Specific Gravity, Urine: 1.017 (ref 1.005–1.030)

## 2017-11-04 LAB — CBC WITH DIFFERENTIAL/PLATELET
Basophils Absolute: 0.1 10*3/uL (ref 0–0.1)
Basophils Relative: 1 %
EOS ABS: 0.1 10*3/uL (ref 0–0.7)
EOS PCT: 1 %
HEMATOCRIT: 39.3 % (ref 35.0–47.0)
HEMOGLOBIN: 13 g/dL (ref 12.0–16.0)
LYMPHS ABS: 0.8 10*3/uL — AB (ref 1.0–3.6)
Lymphocytes Relative: 7 %
MCH: 31.8 pg (ref 26.0–34.0)
MCHC: 33.1 g/dL (ref 32.0–36.0)
MCV: 96 fL (ref 80.0–100.0)
MONOS PCT: 3 %
Monocytes Absolute: 0.4 10*3/uL (ref 0.2–0.9)
Neutro Abs: 9.9 10*3/uL — ABNORMAL HIGH (ref 1.4–6.5)
Neutrophils Relative %: 88 %
Platelets: 223 10*3/uL (ref 150–440)
RBC: 4.09 MIL/uL (ref 3.80–5.20)
RDW: 13 % (ref 11.5–14.5)
WBC: 11.3 10*3/uL — AB (ref 3.6–11.0)

## 2017-11-04 LAB — PROTIME-INR
INR: 1.1
Prothrombin Time: 14.1 seconds (ref 11.4–15.2)

## 2017-11-04 LAB — LACTIC ACID, PLASMA
Lactic Acid, Venous: 2.1 mmol/L (ref 0.5–1.9)
Lactic Acid, Venous: 1 mmol/L (ref 0.5–1.9)

## 2017-11-04 LAB — INFLUENZA PANEL BY PCR (TYPE A & B)
Influenza A By PCR: NEGATIVE
Influenza B By PCR: NEGATIVE

## 2017-11-04 LAB — GLUCOSE, CAPILLARY: Glucose-Capillary: 124 mg/dL — ABNORMAL HIGH (ref 65–99)

## 2017-11-04 MED ORDER — IBUPROFEN 400 MG PO TABS: 400.0000 mg | ORAL_TABLET | Freq: Four times a day (QID) | ORAL | Status: DC | PRN

## 2017-11-04 MED ORDER — CEFTRIAXONE SODIUM IN DEXTROSE 20 MG/ML IV SOLN: 1.0000 g | INTRAVENOUS | Status: DC

## 2017-11-04 MED ORDER — ACETAMINOPHEN 500 MG PO TABS
500.0000 mg | ORAL_TABLET | Freq: Every evening | ORAL | Status: DC | PRN
  Administered 2017-11-05 – 2017-11-06 (×3): 500 mg via ORAL
  Filled 2017-11-04 (×3): qty 1

## 2017-11-04 MED ORDER — PANTOPRAZOLE SODIUM 40 MG PO TBEC
40.0000 mg | DELAYED_RELEASE_TABLET | Freq: Every day | ORAL | Status: DC
  Administered 2017-11-05 – 2017-11-07 (×3): 40 mg via ORAL
  Filled 2017-11-04 (×3): qty 1

## 2017-11-04 MED ORDER — DEXTROSE 5 % IV SOLN
1.0000 g | INTRAVENOUS | Status: DC
  Administered 2017-11-05 – 2017-11-06 (×2): 1 g via INTRAVENOUS
  Filled 2017-11-04 (×4): qty 10

## 2017-11-04 MED ORDER — LEVOTHYROXINE SODIUM 50 MCG PO TABS
50.0000 ug | ORAL_TABLET | Freq: Every day | ORAL | Status: DC
  Administered 2017-11-05 – 2017-11-06 (×2): 50 ug via ORAL
  Filled 2017-11-04 (×2): qty 1

## 2017-11-04 MED ORDER — ALBUTEROL SULFATE (2.5 MG/3ML) 0.083% IN NEBU: 2.5000 mg | INHALATION_SOLUTION | RESPIRATORY_TRACT | Status: DC | PRN

## 2017-11-04 MED ORDER — ONDANSETRON HCL 4 MG PO TABS: 4.0000 mg | ORAL_TABLET | Freq: Four times a day (QID) | ORAL | Status: DC | PRN

## 2017-11-04 MED ORDER — ENOXAPARIN SODIUM 40 MG/0.4ML ~~LOC~~ SOLN
40.0000 mg | SUBCUTANEOUS | Status: DC
  Administered 2017-11-04 – 2017-11-06 (×3): 40 mg via SUBCUTANEOUS
  Filled 2017-11-04 (×3): qty 0.4

## 2017-11-04 MED ORDER — DOCUSATE SODIUM 100 MG PO CAPS
100.0000 mg | ORAL_CAPSULE | Freq: Every day | ORAL | Status: DC
  Administered 2017-11-04 – 2017-11-07 (×4): 100 mg via ORAL
  Filled 2017-11-04 (×4): qty 1

## 2017-11-04 MED ORDER — DIPHENHYDRAMINE-APAP (SLEEP) 25-500 MG PO TABS: 1.0000 | ORAL_TABLET | Freq: Every evening | ORAL | Status: DC | PRN

## 2017-11-04 MED ORDER — ACETAMINOPHEN 325 MG PO TABS: 650.0000 mg | ORAL_TABLET | Freq: Four times a day (QID) | ORAL | Status: DC | PRN

## 2017-11-04 MED ORDER — SODIUM CHLORIDE 0.9 % IV BOLUS (SEPSIS)
500.0000 mL | Freq: Once | INTRAVENOUS | Status: AC
  Administered 2017-11-04: 500 mL via INTRAVENOUS

## 2017-11-04 MED ORDER — MIRABEGRON ER 50 MG PO TB24
50.0000 mg | ORAL_TABLET | Freq: Every day | ORAL | Status: DC
  Administered 2017-11-05 – 2017-11-07 (×3): 50 mg via ORAL
  Filled 2017-11-04 (×3): qty 1

## 2017-11-04 MED ORDER — ONDANSETRON HCL 4 MG/2ML IJ SOLN: 4.0000 mg | Freq: Four times a day (QID) | INTRAMUSCULAR | Status: DC | PRN

## 2017-11-04 MED ORDER — CEFTRIAXONE SODIUM IN DEXTROSE 20 MG/ML IV SOLN
1.0000 g | Freq: Once | INTRAVENOUS | Status: AC
  Administered 2017-11-04: 1 g via INTRAVENOUS
  Filled 2017-11-04: qty 50

## 2017-11-04 MED ORDER — CLOPIDOGREL BISULFATE 75 MG PO TABS
75.0000 mg | ORAL_TABLET | Freq: Every day | ORAL | Status: DC
  Administered 2017-11-05 – 2017-11-07 (×3): 75 mg via ORAL
  Filled 2017-11-04 (×3): qty 1

## 2017-11-04 MED ORDER — ACETAMINOPHEN 650 MG RE SUPP
650.0000 mg | Freq: Four times a day (QID) | RECTAL | Status: DC | PRN
Start: 2017-11-04 — End: 2017-11-07

## 2017-11-04 MED ORDER — SODIUM CHLORIDE 0.9 % IV BOLUS (SEPSIS)
1000.0000 mL | Freq: Once | INTRAVENOUS | Status: AC
  Administered 2017-11-04: 1000 mL via INTRAVENOUS

## 2017-11-04 MED ORDER — DIPHENHYDRAMINE HCL 25 MG PO CAPS
25.0000 mg | ORAL_CAPSULE | Freq: Every evening | ORAL | Status: DC | PRN
  Administered 2017-11-05 – 2017-11-06 (×3): 25 mg via ORAL
  Filled 2017-11-04 (×3): qty 1

## 2017-11-04 MED ORDER — POLYETHYLENE GLYCOL 3350 17 G PO PACK
17.0000 g | PACK | Freq: Every day | ORAL | Status: DC | PRN
  Administered 2017-11-05 – 2017-11-06 (×2): 17 g via ORAL
  Filled 2017-11-04 (×2): qty 1

## 2017-11-04 NOTE — ED Triage Notes (Signed)
Patient presents to ED via POV from home with c/o fever and chills. Patient meets sepsis criteria, febrile and tachycardic. A&O x4.

## 2017-11-04 NOTE — Telephone Encounter (Signed)
Copied from [place] [phone]. Topic: Quick Communication - See Telephone Encounter >> Nov 04, 2017 10:42 AM Hewitt Shorts wrote: CRM for notification. See Telephone encounter for:  Pt is calling stating that  she has been sick for 2days and running a low grade and she states she does not want to come in and spread this to everyone and is needing something called in this is as much detail as she would give   Best number [phone]  [facility] is the pharmacy  11/04/17.

## 2017-11-04 NOTE — H&P (Signed)
[facility] [place] NAME: Sarah Freeman    MR#:  [id number]  DATE OF BIRTH:  [birth year]  DATE OF ADMISSION:  11/04/2017  PRIMARY CARE PHYSICIAN: Kathrine Haddock, NP   REQUESTING/REFERRING PHYSICIAN: Dr. Archie Balboa  CHIEF COMPLAINT:   Chief Complaint  Patient presents with  . Code Sepsis    HISTORY OF PRESENT ILLNESS:  Sarah Freeman  is a 83 y.o. female with a known history of CVA, hypothyroidism presents to the emergency room complaining of dysuria and frequency of many days.  She started having fever and not feeling well for 2 days.  She was seen by urology recently and started on Myrbetriq and Diflucan. Today in the emergency room patient is febrile 103.2 with heart rate of 126 elevated white count and elevated lactic acid.  PAST MEDICAL HISTORY:   Past Medical History:  Diagnosis Date  . Hypothyroid   . Stroke [facility])     PAST SURGICAL HISTORY:   Past Surgical History:  Procedure Laterality Date  . ABDOMINAL HYSTERECTOMY     age 51  . BREAST BIOPSY Left 2000?   neg. dr. Dwyane Luo office  . CHOLECYSTECTOMY  1964  . COLONOSCOPY     Dr Nicolasa Ducking  . DILATION AND CURETTAGE OF UTERUS    . EYE SURGERY    . SKIN CANCER EXCISION     face    SOCIAL HISTORY:   Social History   Tobacco Use  . Smoking status: Never Smoker  . Smokeless tobacco: Never Used  Substance Use Topics  . Alcohol use: No    Alcohol/week: 0.0 oz    FAMILY HISTORY:   Family History  Problem Relation Age of Onset  . Cancer Father        prostate  . Stroke Father   . Hypertension Sister   . Hyperlipidemia Daughter   . Heart disease Son   . Diabetes Maternal Grandmother   . Heart disease Maternal Grandmother   . Heart disease Maternal Grandfather   . Heart disease Daughter   . Breast cancer Neg Hx     DRUG ALLERGIES:   Allergies  Allergen Reactions  . Neosporin [Neomycin-Polymyxin-Gramicidin] Other (See Comments)    Pt states wounds do not  heal with neosporin  . Statins Other (See Comments)    Leg cramps    REVIEW OF SYSTEMS:   Review of Systems  Constitutional: Positive for malaise/fatigue. Negative for chills, fever and weight loss.  HENT: Negative for hearing loss and nosebleeds.   Eyes: Negative for blurred vision, double vision and pain.  Respiratory: Negative for cough, hemoptysis, sputum production, shortness of breath and wheezing.   Cardiovascular: Negative for chest pain, palpitations, orthopnea and leg swelling.  Gastrointestinal: Positive for abdominal pain. Negative for constipation, diarrhea, nausea and vomiting.  Genitourinary: Positive for dysuria and frequency. Negative for hematuria.  Musculoskeletal: Negative for back pain, falls and myalgias.  Skin: Negative for rash.  Neurological: Positive for weakness. Negative for dizziness, tremors, sensory change, speech change, focal weakness, seizures and headaches.  Endo/Heme/Allergies: Does not bruise/bleed easily.  Psychiatric/Behavioral: Negative for depression and memory loss. The patient is not nervous/anxious.     MEDICATIONS AT HOME:   Prior to Admission medications   Medication Sig Start Date End Date Taking? Authorizing Provider  clopidogrel (PLAVIX) 75 MG tablet Take 1 tablet (75 mg total) by mouth daily. 09/14/17  Yes Volney American, PA-C  diphenhydramine-acetaminophen (TYLENOL PM) 25-500 MG TABS tablet Take 1 tablet by  mouth at bedtime as needed.   Yes [provider]  Docusate Calcium (STOOL SOFTENER PO) Take 100 mg by mouth daily.    Yes [provider]  esomeprazole (NEXIUM) 20 MG capsule TAKE 1 CAPSULE DAILY AT 12 NOON 09/14/17  Yes Volney American, PA-C  levothyroxine (SYNTHROID, LEVOTHROID) 50 MCG tablet Take 1 tablet (50 mcg total) by mouth at bedtime. 09/14/17  Yes Volney American, PA-C  mirabegron ER (MYRBETRIQ) 50 MG TB24 tablet Take 1 tablet (50 mg total) by mouth daily. 10/24/17  Yes MacDiarmid,  Nicki Reaper, MD  PSYLLIUM PO Take 6 tablets by mouth at bedtime. 100%    Yes [provider]  Red Yeast Rice Extract (RED YEAST RICE PO) Take 1 tablet by mouth daily.   Yes [provider]  acetaminophen (TYLENOL ARTHRITIS PAIN) 650 MG CR tablet Take 650 mg by mouth every 8 (eight) hours as needed for pain.    [provider]     VITAL SIGNS:  Blood pressure 130/63, pulse (!) 126, temperature (!) 103.2 F (39.6 C), temperature source Oral, resp. rate 15, height 5' (1.524 m), weight 88.7 kg (195 lb 9.6 oz), SpO2 96 %.  PHYSICAL EXAMINATION:  Physical Exam  GENERAL:  83 y.o.-year-old patient lying in the bed with no acute distress.  EYES: Pupils equal, round, reactive to light and accommodation. No scleral icterus. Extraocular muscles intact.  HEENT: Head atraumatic, normocephalic. Oropharynx and nasopharynx clear. No oropharyngeal erythema, moist oral mucosa  NECK:  Supple, no jugular venous distention. No thyroid enlargement, no tenderness.  LUNGS: Normal breath sounds bilaterally, no wheezing, rales, rhonchi. No use of accessory muscles of respiration.  CARDIOVASCULAR: S1, S2 normal. No murmurs, rubs, or gallops.  ABDOMEN: Soft, supra pubic tenderness, nondistended. Bowel sounds present. No organomegaly or mass.  EXTREMITIES: No pedal edema, cyanosis, or clubbing. + 2 pedal & radial pulses b/l.   NEUROLOGIC: Cranial nerves II through XII are intact. No focal Motor or sensory deficits appreciated b/l PSYCHIATRIC: The patient is alert and oriented x 3. Good affect.  SKIN: No obvious rash, lesion, or ulcer.   LABORATORY PANEL:   CBC Recent Labs  Lab 11/04/17 1735  WBC 11.3*  HGB 13.0  HCT 39.3  PLT 223   ------------------------------------------------------------------------------------------------------------------  Chemistries  Recent Labs  Lab 11/04/17 1735  NA 132*  K 4.3  CL 98*  CO2 20*  GLUCOSE 143*  BUN 16  CREATININE 1.11*  CALCIUM 8.6*   AST 31  ALT 20  ALKPHOS 61  BILITOT 0.9   ------------------------------------------------------------------------------------------------------------------  Cardiac Enzymes No results for input(s): TROPONINI in the last 168 hours. ------------------------------------------------------------------------------------------------------------------  RADIOLOGY:  Dg Chest 2 View  Result Date: 11/04/2017 CLINICAL DATA:  Onset of fever today EXAM: CHEST  2 VIEW COMPARISON:  None FINDINGS: Normal heart size, mediastinal contours, and pulmonary vascularity. Atherosclerotic calcification aorta. Minimal bronchitic changes without pulmonary infiltrate, pleural effusion or pneumothorax. Minimal RIGHT basilar atelectasis versus scarring. Bones demineralized. IMPRESSION: Bronchitic changes with minimal RIGHT basilar atelectasis versus scarring. Electronically Signed   By: Lavonia Dana M.D.   On: 11/04/2017 18:51     IMPRESSION AND PLAN:   * UTI with sepsis Start IV ceftriaxone Urine and blood culture sent and pending Bolus normal saline now.  *Hypothyroidism.  Continue levothyroxine.  *History of CVA.  Continue aspirin and Plavix.  DVT prophylaxis with Lovenox  All the records are reviewed and case discussed with ED provider. Management plans discussed with the patient, family and they are  in agreement.  CODE STATUS: FULL CODE  TOTAL TIME TAKING CARE OF THIS PATIENT: 40 minutes.   Leia Alf Darald Uzzle M.D on 11/04/2017 at 8:51 PM  Between 7am to 6pm - Pager - [phone]  After 6pm go to [email or web address] - password [email or web address] [facility]  Office  [phone]  CC: Primary care physician; Kathrine Haddock, NP  Note: This dictation was prepared with Dragon dictation along with smaller phrase technology. Any transcriptional errors that result from this process are unintentional.

## 2017-11-04 NOTE — ED Provider Notes (Signed)
[facility] Emergency Department Provider Note   ____________________________________________   I have reviewed the triage vital signs and the nursing notes.   HISTORY  Chief Complaint Chills  History limited by: Not Limited   HPI Sarah Freeman is a 83 y.o. female who presents to the emergency department today because of concern for fevers and chills.  DURATION:weeks TIMING: worse today SEVERITY: severe CONTEXT: patient states that she has been dealing with a urinary tract infection for a number of weeks. Had been put on antibiotics. Finished that course but continues to feel unwell.  MODIFYING FACTORS: none ASSOCIATED SYMPTOMS: pain with urination  Per medical record review patient has a history of recurrent UTIs.  Past Medical History:  Diagnosis Date  . Hypothyroid   . Stroke [facility])     Patient Active Problem List   Diagnosis Date Noted  . History of stroke 06/14/2017  . Statin intolerance 06/14/2017  . Counseling regarding advanced directives and goals of care 02/22/2017  . Leg pain 11/03/2016  . Constipation 09/01/2016  . Stroke (cerebrum) ([place]) 02/15/2016  . Osteopenia 01/29/2016  . Menopausal state 01/29/2016  . Hypothyroidism 01/29/2016  . GERD (gastroesophageal reflux disease) 01/29/2016  . Hypercholesterolemia 01/29/2016  . Lumbar spinal stenosis 01/29/2016  . Stress incontinence 01/29/2016  . Breast microcalcification, mammographic 06/17/2015    Past Surgical History:  Procedure Laterality Date  . ABDOMINAL HYSTERECTOMY     age 63  . BREAST BIOPSY Left 2000?   neg. dr. Dwyane Luo office  . CHOLECYSTECTOMY  1964  . COLONOSCOPY     Dr Nicolasa Ducking  . DILATION AND CURETTAGE OF UTERUS    . EYE SURGERY    . SKIN CANCER EXCISION     face    Prior to Admission medications   Medication Sig Start Date End Date Taking? Authorizing Provider  acetaminophen (TYLENOL ARTHRITIS PAIN) 650 MG CR tablet Take 650 mg by mouth every 8  (eight) hours as needed for pain.    [provider]  clopidogrel (PLAVIX) 75 MG tablet Take 1 tablet (75 mg total) by mouth daily. 09/14/17   Volney American, PA-C  diphenhydramine-acetaminophen (TYLENOL PM) 25-500 MG TABS tablet Take 1 tablet by mouth at bedtime.    [provider]  Docusate Calcium (STOOL SOFTENER PO) Take by mouth.    [provider]  esomeprazole (NEXIUM) 20 MG capsule TAKE 1 CAPSULE DAILY AT 12 NOON 09/14/17   Volney American, PA-C  levothyroxine (SYNTHROID, LEVOTHROID) 50 MCG tablet Take 1 tablet (50 mcg total) by mouth at bedtime. 09/14/17   Volney American, PA-C  mirabegron ER (MYRBETRIQ) 50 MG TB24 tablet Take 1 tablet (50 mg total) by mouth daily. 10/24/17   Bjorn Loser, MD  PSYLLIUM PO Take 6 tablets by mouth at bedtime. 100%     [provider]  Red Yeast Rice Extract (RED YEAST RICE PO) Take 1 tablet by mouth daily.    [provider]    Allergies Neosporin [neomycin-polymyxin-gramicidin] and Statins  Family History  Problem Relation Age of Onset  . Cancer Father        prostate  . Stroke Father   . Hypertension Sister   . Hyperlipidemia Daughter   . Heart disease Son   . Diabetes Maternal Grandmother   . Heart disease Maternal Grandmother   . Heart disease Maternal Grandfather   . Heart disease Daughter   . Breast cancer Neg Hx     Social History Social History  Tobacco Use  . Smoking status: Never Smoker  . Smokeless tobacco: Never Used  Substance Use Topics  . Alcohol use: No    Alcohol/week: 0.0 oz  . Drug use: No    Review of Systems Constitutional: Positive for fever/chills Eyes: No visual changes. ENT: No sore throat. Cardiovascular: Denies chest pain. Respiratory: Denies shortness of breath. Gastrointestinal: Positive for lower abdominal pain. Genitourinary: Positive for dysuria.  Musculoskeletal: Negative for back pain. Skin: Negative for  rash. Neurological: Negative for headaches, focal weakness or numbness.  ____________________________________________   PHYSICAL EXAM:  VITAL SIGNS: ED Triage Vitals [11/04/17 1738]  Enc Vitals Group     BP 130/63     Pulse Rate (!) 126     Resp 15     Temp (!) 103.2 F (39.6 C)     Temp Source Oral     SpO2 96 %     Weight 187 lb (84.8 kg)     Height 5' (1.524 m)   Constitutional: Alert and oriented. Well appearing and in no distress. Eyes: Conjunctivae are normal.  ENT   Head: Normocephalic and atraumatic.   Nose: No congestion/rhinnorhea.   Mouth/Throat: Mucous membranes are moist.   Neck: No stridor. Hematological/Lymphatic/Immunilogical: No cervical lymphadenopathy. Cardiovascular: Tachycardic, regular rhythm.  No murmurs, rubs, or gallops.  Respiratory: Normal respiratory effort without tachypnea nor retractions. Breath sounds are clear and equal bilaterally. No wheezes/rales/rhonchi. Gastrointestinal: Soft and minimally tender in the lower abdomen. No rebound. No guarding.  Genitourinary: Deferred Musculoskeletal: Normal range of motion in all extremities. No lower extremity edema. Neurologic:  Normal speech and language. No gross focal neurologic deficits are appreciated.  Skin:  Skin is warm, dry and intact. No rash noted. Psychiatric: Mood and affect are normal. Speech and behavior are normal. Patient exhibits appropriate insight and judgment.  ____________________________________________    LABS (pertinent positives/negatives)  Influenza negative Lactic 2.1 CBC wbc 11.3, hgb 13.0 CMP na 132, cr 1.11, glu 143 UA consistent with infection  ____________________________________________   EKG  None  ____________________________________________    RADIOLOGY  CXR Bronchotic changes ____________________________________________   PROCEDURES  Procedures  ____________________________________________   INITIAL IMPRESSION / ASSESSMENT  AND PLAN / ED COURSE  Pertinent labs & imaging results that were available during my care of the patient were reviewed by me and considered in my medical decision making (see chart for details).  Patient presented to the emergency department today because of concerns for fevers and chills.  Patient recently treated for urinary tract infection however states that she never felt completely better.  Patient was febrile when she first presented to the emergency department.  Differential would include influenza, urinary tract infection, pneumonia, bacteremia amongst other etiologies.  Workup is consistent with a urinary tract infection.  Will plan on starting IV antibiotics and admission to the hospital service.  Discussed findings and plan with patient.  ____________________________________________   FINAL CLINICAL IMPRESSION(S) / ED DIAGNOSES  Final diagnoses:  Lower urinary tract infection     Note: This dictation was prepared with Dragon dictation. Any transcriptional errors that result from this process are unintentional     Nance Pear, MD 11/04/17 2003

## 2017-11-04 NOTE — ED Notes (Signed)
Patient transported to X-ray 

## 2017-11-05 LAB — BLOOD CULTURE ID PANEL (REFLEXED)
Acinetobacter baumannii: NOT DETECTED
CANDIDA PARAPSILOSIS: NOT DETECTED
CARBAPENEM RESISTANCE: NOT DETECTED
Candida albicans: NOT DETECTED
Candida glabrata: NOT DETECTED
Candida krusei: NOT DETECTED
Candida tropicalis: NOT DETECTED
ENTEROBACTERIACEAE SPECIES: DETECTED — AB
ENTEROCOCCUS SPECIES: NOT DETECTED
Enterobacter cloacae complex: NOT DETECTED
Escherichia coli: DETECTED — AB
HAEMOPHILUS INFLUENZAE: NOT DETECTED
KLEBSIELLA PNEUMONIAE: NOT DETECTED
Klebsiella oxytoca: NOT DETECTED
LISTERIA MONOCYTOGENES: NOT DETECTED
NEISSERIA MENINGITIDIS: NOT DETECTED
PSEUDOMONAS AERUGINOSA: NOT DETECTED
Proteus species: NOT DETECTED
STAPHYLOCOCCUS AUREUS BCID: NOT DETECTED
STREPTOCOCCUS PYOGENES: NOT DETECTED
STREPTOCOCCUS SPECIES: NOT DETECTED
Serratia marcescens: NOT DETECTED
Staphylococcus species: NOT DETECTED
Streptococcus agalactiae: NOT DETECTED
Streptococcus pneumoniae: NOT DETECTED

## 2017-11-05 LAB — CBC
HCT: 35.3 % (ref 35.0–47.0)
Hemoglobin: 11.8 g/dL — ABNORMAL LOW (ref 12.0–16.0)
MCH: 31.8 pg (ref 26.0–34.0)
MCHC: 33.3 g/dL (ref 32.0–36.0)
MCV: 95.5 fL (ref 80.0–100.0)
PLATELETS: 202 10*3/uL (ref 150–440)
RBC: 3.7 MIL/uL — ABNORMAL LOW (ref 3.80–5.20)
RDW: 13 % (ref 11.5–14.5)
WBC: 15.6 10*3/uL — ABNORMAL HIGH (ref 3.6–11.0)

## 2017-11-05 LAB — BASIC METABOLIC PANEL
Anion gap: 8 (ref 5–15)
BUN: 15 mg/dL (ref 6–20)
CALCIUM: 7.9 mg/dL — AB (ref 8.9–10.3)
CO2: 20 mmol/L — ABNORMAL LOW (ref 22–32)
Chloride: 105 mmol/L (ref 101–111)
Creatinine, Ser: 1.08 mg/dL — ABNORMAL HIGH (ref 0.44–1.00)
GFR calc Af Amer: 53 mL/min — ABNORMAL LOW (ref >60)
GFR, EST NON AFRICAN AMERICAN: 46 mL/min — AB (ref >60)
GLUCOSE: 104 mg/dL — AB (ref 65–99)
Potassium: 3.9 mmol/L (ref 3.5–5.1)
SODIUM: 133 mmol/L — AB (ref 135–145)

## 2017-11-05 NOTE — Plan of Care (Signed)
  Urinary Elimination: Signs and symptoms of infection will decrease 11/05/2017 1423 - Progressing by Oris Drone, RN  Pt verbalized that she still has urgency and frequency; able to get oob to bsc without assistance this shift Dr Leslye Peer verbalized to pt and staff that the pt has a positive blood culture and would not be able to be discharged home this shift Safety: Ability to remain free from injury will improve 11/05/2017 1423 - Progressing by Oris Drone, RN  Pt remains on Moderate Fall Risks this shift

## 2017-11-05 NOTE — Plan of Care (Signed)
  Education: Knowledge of General Education information will improve 11/05/2017 0552 - Progressing by Jeffie Pollock, RN   Health Behavior/Discharge Planning: Ability to manage health-related needs will improve 11/05/2017 0552 - Progressing by Jeffie Pollock, RN   Clinical Measurements: Ability to maintain clinical measurements within normal limits will improve 11/05/2017 0552 - Progressing by Jeffie Pollock, RN   Activity: Risk for activity intolerance will decrease 11/05/2017 0552 - Progressing by Jeffie Pollock, RN   Coping: Level of anxiety will decrease 11/05/2017 0552 - Progressing by Jeffie Pollock, RN   Safety: Ability to remain free from injury will improve 11/05/2017 0552 - Progressing by Jeffie Pollock, RN    Skin Integrity: Risk for impaired skin integrity will decrease 11/05/2017 0552 - Progressing by Jeffie Pollock, RN   Urinary Elimination: Signs and symptoms of infection will decrease 11/05/2017 0552 - Progressing by Jeffie Pollock, RN

## 2017-11-05 NOTE — Progress Notes (Signed)
Patient ID: Sarah Freeman, female   DOB: 06/01/1931, 83 y.o.   MRN: [id number]  [facility] PROGRESS NOTE  CHIMENE SALO HKV:[id number] DOB: [birth year] DOA: 11/04/2017 PCP: Kathrine Haddock, NP  HPI/Subjective: Patient feeling better.  Not having fever or the shaking chill that she did have.  Always has pain all over her body.  It was recently started on Myrbetriq for urinary incontinence.  Objective: Vitals:   11/05/17 0515 11/05/17 1348  BP: (!) 120/50 (!) 117/48  Pulse: 94 85  Resp: 20 14  Temp: 98.9 F (37.2 C) 99.6 F (37.6 C)  SpO2: 96% 97%    Filed Weights   11/04/17 1748 11/04/17 2106 11/05/17 0500  Weight: 88.7 kg (195 lb 9.6 oz) 86.2 kg (190 lb) 86.6 kg (191 lb)    ROS: Review of Systems  Constitutional: Negative for chills and fever.  Eyes: Negative for blurred vision.  Respiratory: Positive for cough. Negative for shortness of breath.   Cardiovascular: Negative for chest pain.  Gastrointestinal: Negative for abdominal pain, constipation, diarrhea, nausea and vomiting.  Genitourinary: Positive for dysuria and urgency.  Musculoskeletal: Negative for joint pain.  Neurological: Negative for dizziness and headaches.   Exam: Physical Exam  Constitutional: She is oriented to person, place, and time.  HENT:  Nose: No mucosal edema.  Mouth/Throat: No oropharyngeal exudate or posterior oropharyngeal edema.  Eyes: Conjunctivae, EOM and lids are normal. Pupils are equal, round, and reactive to light.  Neck: No JVD present. Carotid bruit is not present. No edema present. No thyroid mass and no thyromegaly present.  Cardiovascular: S1 normal and S2 normal. Exam reveals no gallop.  No murmur heard. Pulses:      Dorsalis pedis pulses are 2+ on the right side, and 2+ on the left side.  Respiratory: No respiratory distress. She has decreased breath sounds in the right lower field and the left lower field. She has no wheezes. She has no rhonchi. She has no rales.  GI:  Soft. Bowel sounds are normal. There is no tenderness.  Musculoskeletal:       Right ankle: She exhibits swelling.       Left ankle: She exhibits swelling.  Lymphadenopathy:    She has no cervical adenopathy.  Neurological: She is alert and oriented to person, place, and time. No cranial nerve deficit.  Skin: Skin is warm. No rash noted. Nails show no clubbing.  Psychiatric: She has a normal mood and affect.      Data Reviewed: Basic Metabolic Panel: Recent Labs  Lab 11/04/17 1735 11/05/17 0548  NA 132* 133*  K 4.3 3.9  CL 98* 105  CO2 20* 20*  GLUCOSE 143* 104*  BUN 16 15  CREATININE 1.11* 1.08*  CALCIUM 8.6* 7.9*   Liver Function Tests: Recent Labs  Lab 11/04/17 1735  AST 31  ALT 20  ALKPHOS 61  BILITOT 0.9  PROT 7.1  ALBUMIN 3.5   CBC: Recent Labs  Lab 11/04/17 1735 11/05/17 0548  WBC 11.3* 15.6*  NEUTROABS 9.9*  --   HGB 13.0 11.8*  HCT 39.3 35.3  MCV 96.0 95.5  PLT 223 202    CBG: Recent Labs  Lab 11/04/17 1811  GLUCAP 124*    Recent Results (from the past 240 hour(s))  Culture, blood (Routine x 2)     Status: None (Preliminary result)   Collection Time: 11/04/17  5:35 PM  Result Value Ref Range Status   Specimen Description RIGHT ANTECUBITAL  Final   Special Requests  Final    BOTTLES DRAWN AEROBIC AND ANAEROBIC Blood Culture adequate volume   Culture  Setup Time   Final    Organism ID to follow IN BOTH AEROBIC AND ANAEROBIC BOTTLES GRAM NEGATIVE RODS CRITICAL RESULT CALLED TO, READ BACK BY AND VERIFIED WITH: HANK ZOMPA AT 8563 ON 11/05/17 BY SNJ    Culture GRAM NEGATIVE RODS  Final   Report Status PENDING  Incomplete  Blood Culture ID Panel (Reflexed)     Status: Abnormal   Collection Time: 11/04/17  5:35 PM  Result Value Ref Range Status   Enterococcus species NOT DETECTED NOT DETECTED Final   Listeria monocytogenes NOT DETECTED NOT DETECTED Final   Staphylococcus species NOT DETECTED NOT DETECTED Final   Staphylococcus aureus  NOT DETECTED NOT DETECTED Final   Streptococcus species NOT DETECTED NOT DETECTED Final   Streptococcus agalactiae NOT DETECTED NOT DETECTED Final   Streptococcus pneumoniae NOT DETECTED NOT DETECTED Final   Streptococcus pyogenes NOT DETECTED NOT DETECTED Final   Acinetobacter baumannii NOT DETECTED NOT DETECTED Final   Enterobacteriaceae species DETECTED (A) NOT DETECTED Final    Comment: Enterobacteriaceae represent a large family of gram-negative bacteria, not a single organism. CRITICAL RESULT CALLED TO, READ BACK BY AND VERIFIED WITH: HANK ZOMPA AT 0856 ON 11/05/17 BY SNJ    Enterobacter cloacae complex NOT DETECTED NOT DETECTED Final   Escherichia coli DETECTED (A) NOT DETECTED Final    Comment: CRITICAL RESULT CALLED TO, READ BACK BY AND VERIFIED WITH: HANK ZOMPA AT 0856 ON 11/05/17 BY SNJ    Klebsiella oxytoca NOT DETECTED NOT DETECTED Final   Klebsiella pneumoniae NOT DETECTED NOT DETECTED Final   Proteus species NOT DETECTED NOT DETECTED Final   Serratia marcescens NOT DETECTED NOT DETECTED Final   Carbapenem resistance NOT DETECTED NOT DETECTED Final   Haemophilus influenzae NOT DETECTED NOT DETECTED Final   Neisseria meningitidis NOT DETECTED NOT DETECTED Final   Pseudomonas aeruginosa NOT DETECTED NOT DETECTED Final   Candida albicans NOT DETECTED NOT DETECTED Final   Candida glabrata NOT DETECTED NOT DETECTED Final   Candida krusei NOT DETECTED NOT DETECTED Final   Candida parapsilosis NOT DETECTED NOT DETECTED Final   Candida tropicalis NOT DETECTED NOT DETECTED Final  Culture, blood (Routine x 2)     Status: None (Preliminary result)   Collection Time: 11/04/17  5:51 PM  Result Value Ref Range Status   Specimen Description BLOOD LEFT HAND  Final   Special Requests   Final    BOTTLES DRAWN AEROBIC AND ANAEROBIC Blood Culture results may not be optimal due to an excessive volume of blood received in culture bottles   Culture  Setup Time   Final    ANAEROBIC BOTTLE  ONLY GRAM NEGATIVE RODS CRITICAL VALUE NOTED.  VALUE IS CONSISTENT WITH PREVIOUSLY REPORTED AND CALLED VALUE. SNJ    Culture GRAM NEGATIVE RODS  Final   Report Status PENDING  Incomplete     Studies: Dg Chest 2 View  Result Date: 11/04/2017 CLINICAL DATA:  Onset of fever today EXAM: CHEST  2 VIEW COMPARISON:  None FINDINGS: Normal heart size, mediastinal contours, and pulmonary vascularity. Atherosclerotic calcification aorta. Minimal bronchitic changes without pulmonary infiltrate, pleural effusion or pneumothorax. Minimal RIGHT basilar atelectasis versus scarring. Bones demineralized. IMPRESSION: Bronchitic changes with minimal RIGHT basilar atelectasis versus scarring. Electronically Signed   By: Lavonia Dana M.D.   On: 11/04/2017 18:51    Scheduled Meds: . clopidogrel  75 mg Oral Daily  . docusate sodium  100 mg Oral Daily  . enoxaparin (LOVENOX) injection  40 mg Subcutaneous Q24H  . levothyroxine  50 mcg Oral QHS  . mirabegron ER  50 mg Oral Daily  . pantoprazole  40 mg Oral Daily   Continuous Infusions: . small volume/piggyback builder      Assessment/Plan:  1. Sepsis with E. Coli.  Acute cystitis with hematuria, fever and leukocytosis.  Patient on IV Rocephin. 2. Hypothyroidism unspecified on levothyroxine 3. History of CVA on Plavix  4. Arthritis. 5. Weakness.  Physical therapy evaluation  Code Status:     Code Status Orders  (From admission, onward)        Start     Ordered   11/04/17 2009  Full code  Continuous     11/04/17 2009    Code Status History    Date Active Date Inactive Code Status Order ID Comments User Context   02/15/2016 18:17 02/17/2016 15:48 Full Code [id number]  Bettey Costa, MD Inpatient    Advance Directive Documentation     Most Recent Value  Type of Advance Directive  Living will  Pre-existing out of facility DNR order (yellow form or pink MOST form)  No data  "MOST" Form in Place?  No data     Family Communication: Son at the  bedside Disposition Plan: Would like to see sensitivities on blood cultures prior to disposition  Antibiotics:  Rocephin   Time spent: 28 minutes  [place]

## 2017-11-06 LAB — BASIC METABOLIC PANEL
Anion gap: 10 (ref 5–15)
BUN: 13 mg/dL (ref 6–20)
CHLORIDE: 104 mmol/L (ref 101–111)
CO2: 21 mmol/L — AB (ref 22–32)
Calcium: 8.2 mg/dL — ABNORMAL LOW (ref 8.9–10.3)
Creatinine, Ser: 0.96 mg/dL (ref 0.44–1.00)
GFR calc non Af Amer: 53 mL/min — ABNORMAL LOW (ref >60)
Glucose, Bld: 102 mg/dL — ABNORMAL HIGH (ref 65–99)
POTASSIUM: 3.9 mmol/L (ref 3.5–5.1)
SODIUM: 135 mmol/L (ref 135–145)

## 2017-11-06 LAB — CBC
HEMATOCRIT: 36.7 % (ref 35.0–47.0)
HEMOGLOBIN: 12.5 g/dL (ref 12.0–16.0)
MCH: 32.7 pg (ref 26.0–34.0)
MCHC: 34.2 g/dL (ref 32.0–36.0)
MCV: 95.4 fL (ref 80.0–100.0)
Platelets: 210 10*3/uL (ref 150–440)
RBC: 3.84 MIL/uL (ref 3.80–5.20)
RDW: 12.8 % (ref 11.5–14.5)
WBC: 9.8 10*3/uL (ref 3.6–11.0)

## 2017-11-06 NOTE — Progress Notes (Signed)
Patient ID: Sarah Freeman, female   DOB: 22-Jan-1931, 83 y.o.   MRN: [id number]  [facility] PROGRESS NOTE  Sarah Freeman:[id number] DOB: November 02, 1931 DOA: 11/04/2017 PCP: Kathrine Haddock, NP  HPI/Subjective: Patient feeling better.  Still feeling weak.  States she gave out while just adjusting the covers.  Feels better than when she came in.  Objective: Vitals:   11/06/17 0500 11/06/17 0632  BP:  (!) 143/67  Pulse:  89  Resp:  (!) 22  Temp: 99.2 F (37.3 C) 99.2 F (37.3 C)  SpO2:      Filed Weights   11/04/17 2106 11/05/17 0500 11/06/17 0700  Weight: 86.2 kg (190 lb) 86.6 kg (191 lb) 88 kg (194 lb)    ROS: Review of Systems  Constitutional: Negative for chills and fever.  Eyes: Negative for blurred vision.  Respiratory: Positive for cough. Negative for shortness of breath.   Cardiovascular: Negative for chest pain.  Gastrointestinal: Negative for abdominal pain, constipation, diarrhea, nausea and vomiting.  Genitourinary: Positive for dysuria and urgency.  Musculoskeletal: Negative for joint pain.  Neurological: Negative for dizziness and headaches.   Exam: Physical Exam  Constitutional: She is oriented to person, place, and time.  HENT:  Nose: No mucosal edema.  Mouth/Throat: No oropharyngeal exudate or posterior oropharyngeal edema.  Eyes: Conjunctivae, EOM and lids are normal. Pupils are equal, round, and reactive to light.  Neck: No JVD present. Carotid bruit is not present. No edema present. No thyroid mass and no thyromegaly present.  Cardiovascular: S1 normal and S2 normal. Exam reveals no gallop.  No murmur heard. Pulses:      Dorsalis pedis pulses are 2+ on the right side, and 2+ on the left side.  Respiratory: No respiratory distress. She has decreased breath sounds in the right lower field and the left lower field. She has no wheezes. She has no rhonchi. She has no rales.  GI: Soft. Bowel sounds are normal. There is no tenderness.   Musculoskeletal:       Right ankle: She exhibits swelling.       Left ankle: She exhibits swelling.  Lymphadenopathy:    She has no cervical adenopathy.  Neurological: She is alert and oriented to person, place, and time. No cranial nerve deficit.  Skin: Skin is warm. No rash noted. Nails show no clubbing.  Psychiatric: She has a normal mood and affect.      Data Reviewed: Basic Metabolic Panel: Recent Labs  Lab 11/04/17 1735 11/05/17 0548 11/06/17 0428  NA 132* 133* 135  K 4.3 3.9 3.9  CL 98* 105 104  CO2 20* 20* 21*  GLUCOSE 143* 104* 102*  BUN 16 15 13   CREATININE 1.11* 1.08* 0.96  CALCIUM 8.6* 7.9* 8.2*   Liver Function Tests: Recent Labs  Lab 11/04/17 1735  AST 31  ALT 20  ALKPHOS 61  BILITOT 0.9  PROT 7.1  ALBUMIN 3.5   CBC: Recent Labs  Lab 11/04/17 1735 11/05/17 0548 11/06/17 0428  WBC 11.3* 15.6* 9.8  NEUTROABS 9.9*  --   --   HGB 13.0 11.8* 12.5  HCT 39.3 35.3 36.7  MCV 96.0 95.5 95.4  PLT 223 202 210    CBG: Recent Labs  Lab 11/04/17 1811  GLUCAP 124*    Recent Results (from the past 240 hour(s))  Culture, blood (Routine x 2)     Status: Abnormal (Preliminary result)   Collection Time: 11/04/17  5:35 PM  Result Value Ref Range Status  Specimen Description RIGHT ANTECUBITAL  Final   Special Requests   Final    BOTTLES DRAWN AEROBIC AND ANAEROBIC Blood Culture adequate volume   Culture  Setup Time   Final    IN BOTH AEROBIC AND ANAEROBIC BOTTLES GRAM NEGATIVE RODS CRITICAL RESULT CALLED TO, READ BACK BY AND VERIFIED WITH: HANK ZOMPA AT 5366 ON 11/05/17 BY SNJ    Culture (A)  Final    ESCHERICHIA COLI SUSCEPTIBILITIES TO FOLLOW Performed at [facility], [place] [address] [place], [place] [address]    Report Status PENDING  Incomplete  Blood Culture ID Panel (Reflexed)     Status: Abnormal   Collection Time: 11/04/17  5:35 PM  Result Value Ref Range Status   Enterococcus species NOT DETECTED NOT DETECTED Final   Listeria  monocytogenes NOT DETECTED NOT DETECTED Final   Staphylococcus species NOT DETECTED NOT DETECTED Final   Staphylococcus aureus NOT DETECTED NOT DETECTED Final   Streptococcus species NOT DETECTED NOT DETECTED Final   Streptococcus agalactiae NOT DETECTED NOT DETECTED Final   Streptococcus pneumoniae NOT DETECTED NOT DETECTED Final   Streptococcus pyogenes NOT DETECTED NOT DETECTED Final   Acinetobacter baumannii NOT DETECTED NOT DETECTED Final   Enterobacteriaceae species DETECTED (A) NOT DETECTED Final    Comment: Enterobacteriaceae represent a large family of gram-negative bacteria, not a single organism. CRITICAL RESULT CALLED TO, READ BACK BY AND VERIFIED WITH: HANK ZOMPA AT 0856 ON 11/05/17 BY SNJ    Enterobacter cloacae complex NOT DETECTED NOT DETECTED Final   Escherichia coli DETECTED (A) NOT DETECTED Final    Comment: CRITICAL RESULT CALLED TO, READ BACK BY AND VERIFIED WITH: HANK ZOMPA AT 0856 ON 11/05/17 BY SNJ    Klebsiella oxytoca NOT DETECTED NOT DETECTED Final   Klebsiella pneumoniae NOT DETECTED NOT DETECTED Final   Proteus species NOT DETECTED NOT DETECTED Final   Serratia marcescens NOT DETECTED NOT DETECTED Final   Carbapenem resistance NOT DETECTED NOT DETECTED Final   Haemophilus influenzae NOT DETECTED NOT DETECTED Final   Neisseria meningitidis NOT DETECTED NOT DETECTED Final   Pseudomonas aeruginosa NOT DETECTED NOT DETECTED Final   Candida albicans NOT DETECTED NOT DETECTED Final   Candida glabrata NOT DETECTED NOT DETECTED Final   Candida krusei NOT DETECTED NOT DETECTED Final   Candida parapsilosis NOT DETECTED NOT DETECTED Final   Candida tropicalis NOT DETECTED NOT DETECTED Final  Culture, blood (Routine x 2)     Status: Abnormal (Preliminary result)   Collection Time: 11/04/17  5:51 PM  Result Value Ref Range Status   Specimen Description BLOOD LEFT HAND  Final   Special Requests   Final    BOTTLES DRAWN AEROBIC AND ANAEROBIC Blood Culture results may  not be optimal due to an excessive volume of blood received in culture bottles   Culture  Setup Time   Final    IN BOTH AEROBIC AND ANAEROBIC BOTTLES GRAM NEGATIVE RODS CRITICAL VALUE NOTED.  VALUE IS CONSISTENT WITH PREVIOUSLY REPORTED AND CALLED VALUE. SNJ    Culture ESCHERICHIA COLI (A)  Final   Report Status PENDING  Incomplete     Studies: Dg Chest 2 View  Result Date: 11/04/2017 CLINICAL DATA:  Onset of fever today EXAM: CHEST  2 VIEW COMPARISON:  None FINDINGS: Normal heart size, mediastinal contours, and pulmonary vascularity. Atherosclerotic calcification aorta. Minimal bronchitic changes without pulmonary infiltrate, pleural effusion or pneumothorax. Minimal RIGHT basilar atelectasis versus scarring. Bones demineralized. IMPRESSION: Bronchitic changes with minimal RIGHT basilar atelectasis versus  scarring. Electronically Signed   By: Lavonia Dana M.D.   On: 11/04/2017 18:51    Scheduled Meds: . clopidogrel  75 mg Oral Daily  . docusate sodium  100 mg Oral Daily  . enoxaparin (LOVENOX) injection  40 mg Subcutaneous Q24H  . levothyroxine  50 mcg Oral QHS  . mirabegron ER  50 mg Oral Daily  . pantoprazole  40 mg Oral Daily   Continuous Infusions: . small volume/piggyback builder Stopped (11/05/17 1940)    Assessment/Plan:  1. Sepsis with E. Coli.  Acute cystitis with hematuria, fever and leukocytosis.  Patient on IV Rocephin.  Blood culture sensitivities will be back tomorrow.  Unfortunately no urine culture sent from the ER. 2. Hypothyroidism unspecified on levothyroxine 3. History of CVA on Plavix  4. Arthritis. 5. Weakness.  Physical therapy evaluation  Code Status:     Code Status Orders  (From admission, onward)        Start     Ordered   11/04/17 2009  Full code  Continuous     11/04/17 2009    Code Status History    Date Active Date Inactive Code Status Order ID Comments User Context   02/15/2016 18:17 02/17/2016 15:48 Full Code [id number]  Bettey Costa, MD  Inpatient    Advance Directive Documentation     Most Recent Value  Type of Advance Directive  Living will  Pre-existing out of facility DNR order (yellow form or pink MOST form)  No data  "MOST" Form in Place?  No data     Family Communication: Son on the phone Disposition Plan: Home tomorrow after sensitivities back on culture  Antibiotics:  Rocephin   Time spent: 26 minutes  [place]

## 2017-11-06 NOTE — Plan of Care (Signed)
Pt still experiencing urgency and frequency. Waiting on blood cultures. ? D/C 11/06/17

## 2017-11-07 ENCOUNTER — Ambulatory Visit: Payer: Medicare Other

## 2017-11-07 LAB — CULTURE, BLOOD (ROUTINE X 2): SPECIAL REQUESTS: ADEQUATE

## 2017-11-07 MED ORDER — CEPHALEXIN 500 MG PO CAPS: 500.0000 mg | ORAL_CAPSULE | Freq: Three times a day (TID) | ORAL | Status: DC

## 2017-11-07 MED ORDER — CEPHALEXIN 500 MG PO CAPS: 500.0000 mg | ORAL_CAPSULE | Freq: Three times a day (TID) | ORAL | 0 refills | Status: DC

## 2017-11-07 MED ORDER — CEFTRIAXONE SODIUM 1 G IJ SOLR
1.0000 g | Freq: Once | INTRAMUSCULAR | Status: AC
Start: 2017-11-07 — End: 2017-11-07
  Administered 2017-11-07: 1 g via INTRAVENOUS
  Filled 2017-11-07: qty 10

## 2017-11-07 NOTE — Evaluation (Signed)
Physical Therapy Evaluation Patient Details Name: Sarah Freeman MRN: [id number] DOB: 1931-11-24 Today's Date: 11/07/2017   History of Present Illness  Pt admitted for UTI. History includes CVA. Pt with complaints of dysuria.   Clinical Impression  Pt is a pleasant 83 year old female who was admitted for UTI. Pt is A&O x 4. Pt performs transfers and ambulation with independence without AD used. Occasionally reaches out for railing during ambulation, however this is baseline per patient. No falls history. Pt demonstrates all bed mobility/transfers/ambulation at baseline level. Pt does not require any further PT needs at this time. Pt will be dc in house and does not require follow up. RN aware. Will dc current orders.      Follow Up Recommendations No PT follow up    Equipment Recommendations  None recommended by PT    Recommendations for Other Services       Precautions / Restrictions Precautions Precautions: Fall Restrictions Weight Bearing Restrictions: No      Mobility  Bed Mobility               General bed mobility comments: pt receiving on BSC. Bed mobility not performed  Transfers Overall transfer level: Independent   Transfers: Sit to/from Stand Sit to Stand: Independent         General transfer comment: transfers performed with upright posture. No AD required  Ambulation/Gait Ambulation/Gait assistance: Independent Ambulation Distance (Feet): 125 Feet Assistive device: None Gait Pattern/deviations: Step-through pattern     General Gait Details: safe technique with reciprocal gait pattern. Pt often reaches out for railing. No AD used  Financial trader [employer] (Stroke Patients Only)       Balance Overall balance assessment: Modified Independent                                           Pertinent Vitals/Pain Pain Assessment: No/denies pain    Home Living Family/patient expects  to be discharged to:: Private residence Living Arrangements: Alone Available Help at Discharge: Family(close by) Type of Home: House Home Access: Stairs to enter Entrance Stairs-Rails: Can reach both Entrance Stairs-Number of Steps: 1 Home Layout: One level;Laundry or work area in basement;Able to live on main level with [employer]: None      Prior Function Level of Independence: Independent               Journalist, newspaper        Extremity/Trunk Assessment   Upper Extremity Assessment Upper Extremity Assessment: Overall WFL for tasks assessed    Lower Extremity Assessment Lower Extremity Assessment: Overall WFL for tasks assessed       Communication   Communication: No difficulties  Cognition Arousal/Alertness: Awake/alert Behavior During Therapy: WFL for tasks assessed/performed Overall Cognitive Status: Within Functional Limits for tasks assessed                                        General Comments      Exercises Other Exercises Other Exercises: Received on [facility] upon arrival. Safe technique and independence with self hygiene and transfers on/off bed.   Assessment/Plan    PT Assessment Patent does not need any further PT services  PT  Problem List         PT Treatment Interventions      PT Goals (Current goals can be found in the Care Plan section)  Acute Rehab PT Goals Patient Stated Goal: to go home PT Goal Formulation: All assessment and education complete, DC therapy Time For Goal Achievement: 11/07/17 Potential to Achieve Goals: Good    Frequency     Barriers to discharge        Co-evaluation               AM-PAC PT "6 Clicks" Daily Activity  Outcome Measure Difficulty turning over in bed (including adjusting bedclothes, sheets and blankets)?: None Difficulty moving from lying on back to sitting on the side of the bed? : None Difficulty sitting down on and standing up from a chair with arms  (e.g., wheelchair, bedside commode, etc,.)?: None Help needed moving to and from a bed to chair (including a wheelchair)?: None Help needed walking in hospital room?: None Help needed climbing 3-5 steps with a railing? : None 6 Click Score: 24    End of Session Equipment Utilized During Treatment: Gait belt Activity Tolerance: Patient tolerated treatment well Patient left: in chair Nurse Communication: Mobility status PT Visit Diagnosis: Muscle weakness (generalized) (M62.81)    Time: 9675-9163 PT Time Calculation (min) (ACUTE ONLY): 11 min   Charges:   PT Evaluation $PT Eval Low Complexity: 1 Low     PT G Codes:   PT G-Codes **NOT FOR INPATIENT CLASS** Functional Assessment Tool Used: AM-PAC 6 Clicks Basic Mobility Functional Limitation: Mobility: Walking and moving around Mobility: Walking and Moving Around Current Status (W4665): 0 percent impaired, limited or restricted Mobility: Walking and Moving Around Goal Status (L9357): 0 percent impaired, limited or restricted    Greggory Stallion, PT, DPT [phone]   Tajae Rybicki 11/07/2017, 12:26 PM

## 2017-11-07 NOTE — Discharge Instructions (Signed)
Okay to take the diflucan that your urologist prescribed.

## 2017-11-07 NOTE — Telephone Encounter (Signed)
Attempted  To call  Patient  Back  And  Left a  Message  On  Answering machine  For  More  info

## 2017-11-07 NOTE — Care Management Important Message (Signed)
Important Message  Patient Details  Name: Sarah Freeman MRN: [id number] Date of Birth: 1931-02-18   Medicare Important Message Given:  Yes    Marshell Garfinkel, RN 11/07/2017, 9:16 AM

## 2017-11-07 NOTE — Progress Notes (Signed)
Discussed discharge instructions and medications with patient. IV removed. All questions addressed. Patient transported home via car by her son.  Clarise Cruz, RN

## 2017-11-07 NOTE — Discharge Summary (Addendum)
[facility] NAME: Sarah Freeman    MR#:  [id number]  DATE OF BIRTH:  08/25/1931  DATE OF ADMISSION:  11/04/2017 ADMITTING PHYSICIAN: Hillary Bow, MD  DATE OF DISCHARGE: 11/07/2017 12:35 PM  PRIMARY CARE PHYSICIAN: Kathrine Haddock, NP    ADMISSION DIAGNOSIS:  Lower urinary tract infection [N39.0]  DISCHARGE DIAGNOSIS:  Active Problems:   UTI (urinary tract infection)   SECONDARY DIAGNOSIS:   Past Medical History:  Diagnosis Date  . Hypothyroid   . Stroke [facility])     HOSPITAL COURSE:   1.  Sepsis with E. coli.  Acute cystitis with hematuria with fever and leukocytosis.  The patient was given IV Rocephin while here in the hospital.  E. coli was sensitive to Rocephin and Keflex that I will send her home on.  Unfortunately no urine culture was sent from the ER. 2.  Hypothyroidism unspecified on levothyroxine 3.  History of CVA on Plavix 4.  Arthritis 5.  Weakness.  This has resolved secondary to sepsis.  Did well with physical therapy. 6.  Urinary incontinence on Myrbetriq as outpatient.  Urologist also prescribe Diflucan for fungal infection in the vaginal area which is okay to go on.  Follow-up with urology as outpatient   DISCHARGE CONDITIONS:   Satisfactory  CONSULTS OBTAINED:  None  DRUG ALLERGIES:   Allergies  Allergen Reactions  . Neosporin [Neomycin-Polymyxin-Gramicidin] Other (See Comments)    Pt states wounds do not heal with neosporin  . Statins Other (See Comments)    Leg cramps    DISCHARGE MEDICATIONS:   Allergies as of 11/07/2017      Reactions   Neosporin [neomycin-polymyxin-gramicidin] Other (See Comments)   Pt states wounds do not heal with neosporin   Statins Other (See Comments)   Leg cramps      Medication List    TAKE these medications   cephALEXin 500 MG capsule Commonly known as:  KEFLEX Take 1 capsule (500 mg total) by mouth every 8 (eight) hours. Start taking on:   11/08/2017   clopidogrel 75 MG tablet Commonly known as:  PLAVIX Take 1 tablet (75 mg total) by mouth daily.   diphenhydramine-acetaminophen 25-500 MG Tabs tablet Commonly known as:  TYLENOL PM Take 1 tablet by mouth at bedtime as needed.   esomeprazole 20 MG capsule Commonly known as:  NEXIUM TAKE 1 CAPSULE DAILY AT 12 NOON   levothyroxine 50 MCG tablet Commonly known as:  SYNTHROID, LEVOTHROID Take 1 tablet (50 mcg total) by mouth at bedtime.   mirabegron ER 50 MG Tb24 tablet Commonly known as:  MYRBETRIQ Take 1 tablet (50 mg total) by mouth daily.   PSYLLIUM PO Take 6 tablets by mouth at bedtime. 100%   RED YEAST RICE PO Take 1 tablet by mouth daily.   STOOL SOFTENER PO Take 100 mg by mouth daily.   TYLENOL ARTHRITIS PAIN 650 MG CR tablet Generic drug:  acetaminophen Take 650 mg by mouth every 8 (eight) hours as needed for pain.        DISCHARGE INSTRUCTIONS:   Follow-up PMD 1 week Follow-up with urology as outpatient  If you experience worsening of your admission symptoms, develop shortness of breath, life threatening emergency, suicidal or homicidal thoughts you must seek medical attention immediately by calling 911 or calling your MD immediately  if symptoms less severe.  You Must read complete instructions/literature along with all the possible adverse reactions/side effects for all the Medicines you take and  that have been prescribed to you. Take any new Medicines after you have completely understood and accept all the possible adverse reactions/side effects.   Please note  You were cared for by a hospitalist during your hospital stay. If you have any questions about your discharge medications or the care you received while you were in the hospital after you are discharged, you can call the unit and asked to speak with the hospitalist on call if the hospitalist that took care of you is not available. Once you are discharged, your primary care physician will  handle any further medical issues. Please note that NO REFILLS for any discharge medications will be authorized once you are discharged, as it is imperative that you return to your primary care physician (or establish a relationship with a primary care physician if you do not have one) for your aftercare needs so that they can reassess your need for medications and monitor your lab values.    Today   CHIEF COMPLAINT:   Chief Complaint  Patient presents with  . Code Sepsis    HISTORY OF PRESENT ILLNESS:  Sarah Freeman  is a 83 y.o. female came in with weakness and found to have clinical sepsis with fever leukocytosis and a positive urine analysis   VITAL SIGNS:  Blood pressure 139/69, pulse 74, temperature 98.5 F (36.9 C), temperature source Oral, resp. rate 20, height 5' (1.524 m), weight 88 kg (194 lb), SpO2 93 %.    PHYSICAL EXAMINATION:  GENERAL:  83 y.o.-year-old patient lying in the bed with no acute distress.  EYES: Pupils equal, round, reactive to light and accommodation. No scleral icterus. Extraocular muscles intact.  HEENT: Head atraumatic, normocephalic. Oropharynx and nasopharynx clear.  NECK:  Supple, no jugular venous distention. No thyroid enlargement, no tenderness.  LUNGS: Normal breath sounds bilaterally, no wheezing, rales,rhonchi or crepitation. No use of accessory muscles of respiration.  CARDIOVASCULAR: S1, S2 normal. No murmurs, rubs, or gallops.  ABDOMEN: Soft, non-tender, non-distended. Bowel sounds present. No organomegaly or mass.  EXTREMITIES: Trace edema, no cyanosis, or clubbing.  NEUROLOGIC: Cranial nerves II through XII are intact. Muscle strength 5/5 in all extremities. Sensation intact. Gait not checked.  PSYCHIATRIC: The patient is alert and oriented x 3.  SKIN: Vaginal area skin little darkening of the skin in the perineum.  Some swelling of the labia with some erythema there.  Nursing present for entire interview and exam.  DATA REVIEW:    CBC Recent Labs  Lab 11/06/17 0428  WBC 9.8  HGB 12.5  HCT 36.7  PLT 210    Chemistries  Recent Labs  Lab 11/04/17 1735  11/06/17 0428  NA 132*   < > 135  K 4.3   < > 3.9  CL 98*   < > 104  CO2 20*   < > 21*  GLUCOSE 143*   < > 102*  BUN 16   < > 13  CREATININE 1.11*   < > 0.96  CALCIUM 8.6*   < > 8.2*  AST 31  --   --   ALT 20  --   --   ALKPHOS 61  --   --   BILITOT 0.9  --   --    < > = values in this interval not displayed.     Microbiology Results  Results for orders placed or performed during the hospital encounter of 11/04/17  Culture, blood (Routine x 2)     Status: Abnormal  Collection Time: 11/04/17  5:35 PM  Result Value Ref Range Status   Specimen Description RIGHT ANTECUBITAL  Final   Special Requests   Final    BOTTLES DRAWN AEROBIC AND ANAEROBIC Blood Culture adequate volume   Culture  Setup Time   Final    IN BOTH AEROBIC AND ANAEROBIC BOTTLES GRAM NEGATIVE RODS CRITICAL RESULT CALLED TO, READ BACK BY AND VERIFIED WITH: HANK ZOMPA AT 7628 ON 11/05/17 BY SNJ    Culture ESCHERICHIA COLI (A)  Final   Report Status 11/07/2017 FINAL  Final   Organism ID, Bacteria ESCHERICHIA COLI  Final      Susceptibility   Escherichia coli - MIC*    AMPICILLIN >=32 RESISTANT Resistant     CEFAZOLIN <=4 SENSITIVE Sensitive     CEFEPIME <=1 SENSITIVE Sensitive     CEFTAZIDIME <=1 SENSITIVE Sensitive     CEFTRIAXONE <=1 SENSITIVE Sensitive     CIPROFLOXACIN <=0.25 SENSITIVE Sensitive     GENTAMICIN <=1 SENSITIVE Sensitive     IMIPENEM <=0.25 SENSITIVE Sensitive     TRIMETH/SULFA <=20 SENSITIVE Sensitive     AMPICILLIN/SULBACTAM >=32 RESISTANT Resistant     PIP/TAZO <=4 SENSITIVE Sensitive     Extended ESBL NEGATIVE Sensitive     * ESCHERICHIA COLI  Blood Culture ID Panel (Reflexed)     Status: Abnormal   Collection Time: 11/04/17  5:35 PM  Result Value Ref Range Status   Enterococcus species NOT DETECTED NOT DETECTED Final   Listeria monocytogenes NOT  DETECTED NOT DETECTED Final   Staphylococcus species NOT DETECTED NOT DETECTED Final   Staphylococcus aureus NOT DETECTED NOT DETECTED Final   Streptococcus species NOT DETECTED NOT DETECTED Final   Streptococcus agalactiae NOT DETECTED NOT DETECTED Final   Streptococcus pneumoniae NOT DETECTED NOT DETECTED Final   Streptococcus pyogenes NOT DETECTED NOT DETECTED Final   Acinetobacter baumannii NOT DETECTED NOT DETECTED Final   Enterobacteriaceae species DETECTED (A) NOT DETECTED Final    Comment: Enterobacteriaceae represent a large family of gram-negative bacteria, not a single organism. CRITICAL RESULT CALLED TO, READ BACK BY AND VERIFIED WITH: HANK ZOMPA AT 0856 ON 11/05/17 BY SNJ    Enterobacter cloacae complex NOT DETECTED NOT DETECTED Final   Escherichia coli DETECTED (A) NOT DETECTED Final    Comment: CRITICAL RESULT CALLED TO, READ BACK BY AND VERIFIED WITH: HANK ZOMPA AT 0856 ON 11/05/17 BY SNJ    Klebsiella oxytoca NOT DETECTED NOT DETECTED Final   Klebsiella pneumoniae NOT DETECTED NOT DETECTED Final   Proteus species NOT DETECTED NOT DETECTED Final   Serratia marcescens NOT DETECTED NOT DETECTED Final   Carbapenem resistance NOT DETECTED NOT DETECTED Final   Haemophilus influenzae NOT DETECTED NOT DETECTED Final   Neisseria meningitidis NOT DETECTED NOT DETECTED Final   Pseudomonas aeruginosa NOT DETECTED NOT DETECTED Final   Candida albicans NOT DETECTED NOT DETECTED Final   Candida glabrata NOT DETECTED NOT DETECTED Final   Candida krusei NOT DETECTED NOT DETECTED Final   Candida parapsilosis NOT DETECTED NOT DETECTED Final   Candida tropicalis NOT DETECTED NOT DETECTED Final  Culture, blood (Routine x 2)     Status: Abnormal   Collection Time: 11/04/17  5:51 PM  Result Value Ref Range Status   Specimen Description BLOOD LEFT HAND  Final   Special Requests   Final    BOTTLES DRAWN AEROBIC AND ANAEROBIC Blood Culture results may not be optimal due to an excessive  volume of blood received in culture bottles  Culture  Setup Time   Final    IN BOTH AEROBIC AND ANAEROBIC BOTTLES GRAM NEGATIVE RODS CRITICAL VALUE NOTED.  VALUE IS CONSISTENT WITH PREVIOUSLY REPORTED AND CALLED VALUE. SNJ    Culture (A)  Final    ESCHERICHIA COLI SUSCEPTIBILITIES PERFORMED ON PREVIOUS CULTURE WITHIN THE LAST 5 DAYS. Performed at [facility], [place] [address] [place], [place] [address]    Report Status 11/07/2017 FINAL  Final      Management plans discussed with the patient, family and they are in agreement.  CODE STATUS:     Code Status Orders  (From admission, onward)        Start     Ordered   11/04/17 2009  Full code  Continuous     11/04/17 2009    Code Status History    Date Active Date Inactive Code Status Order ID Comments User Context   02/15/2016 18:17 02/17/2016 15:48 Full Code [id number]  Bettey Costa, MD Inpatient    Advance Directive Documentation     Most Recent Value  Type of Advance Directive  Living will  Pre-existing out of facility DNR order (yellow form or pink MOST form)  No data  "MOST" Form in Place?  No data      TOTAL TIME TAKING CARE OF THIS PATIENT: 36 minutes.    Loletha Grayer M.D on 11/07/2017 at 2:20 PM  Between 7am to 6pm - Pager - [phone]  After 6pm go to [email or web address] - password [email or web address]  [facility] Office  [phone]  CC: Primary care physician; Kathrine Haddock, NP

## 2017-11-09 ENCOUNTER — Telehealth: Payer: Self-pay

## 2017-11-09 NOTE — Telephone Encounter (Signed)
Transition Care Management Follow-up Telephone Call   Date discharged? 11/07/2017   How have you been since you were released from the hospital? "About the same"   Do you understand why you were in the hospital? yes   Do you understand the discharge instructions? yes   Where were you discharged to? Home   Items Reviewed:  Medications reviewed: yes  Allergies reviewed: yes  Dietary changes reviewed: yes  Referrals reviewed: yes   Functional Questionnaire:   Activities of Daily Living (ADLs):   She states they are independent in the following: ambulation, bathing and hygiene, feeding, continence, grooming, toileting and dressing States they require assistance with the following: none   Any transportation issues/concerns?: no   Any patient concerns? No, has follow up appt with urologist.    Confirmed importance and date/time of follow-up visits scheduled yes  Patient declined appt with PCP at this time. She is following up with her urologist with this issue and will call if needed.   Confirmed with patient if condition begins to worsen call PCP or go to the ER.  Patient was given the office number and encouraged to call back with question or concerns.  : yes

## 2017-11-14 ENCOUNTER — Encounter: Payer: Self-pay | Admitting: Urology

## 2017-11-14 ENCOUNTER — Ambulatory Visit (INDEPENDENT_AMBULATORY_CARE_PROVIDER_SITE_OTHER): Payer: Medicare Other | Admitting: Urology

## 2017-11-14 VITALS — BP 124/76 | HR 80 | Ht 60.0 in | Wt 184.0 lb

## 2017-11-14 DIAGNOSIS — N3946 Mixed incontinence: Secondary | ICD-10-CM | POA: Diagnosis not present

## 2017-11-14 DIAGNOSIS — N390 Urinary tract infection, site not specified: Secondary | ICD-10-CM | POA: Diagnosis not present

## 2017-11-14 LAB — MICROSCOPIC EXAMINATION

## 2017-11-14 LAB — URINALYSIS, COMPLETE
BILIRUBIN UA: NEGATIVE
GLUCOSE, UA: NEGATIVE
Ketones, UA: NEGATIVE
Nitrite, UA: NEGATIVE
PROTEIN UA: NEGATIVE
RBC UA: NEGATIVE
SPEC GRAV UA: 1.01 (ref 1.005–1.030)
UUROB: 0.2 mg/dL (ref 0.2–1.0)
pH, UA: 6 (ref 5.0–7.5)

## 2017-11-14 MED ORDER — SOLIFENACIN SUCCINATE 5 MG PO TABS: 5.0000 mg | ORAL_TABLET | Freq: Every day | ORAL | 11 refills | Status: DC

## 2017-11-14 NOTE — Progress Notes (Signed)
11/14/2017 9:09 AM   Sarah Freeman [place] 1931-08-17 [id number]  Referring provider: Kathrine Haddock, NP [address], [place] [address]  Chief Complaint  Patient presents with  . Urinary Incontinence  . Urinary Tract Infection  . Follow-up    HPI: F/u UTI - she recently saw Dr. Matilde Sprang for mixed incontinence with a urine cx that grew mixed growth. She was treated for a yeast infection. She started Myrbetriq 50 mg after findings of a benign exam and PVR of 0. She was admitted 11/04/2017 with Blood cx growing e coli. Pt was hospitalized with septic picture. No urine cx sent. She was d/c'd from hospital 1 week ago. She continues cephalexin. She's still having incontinence and noc x 3 on Myrbetriq - no change in symptoms. Abx helped the dysuria, but she still has mild dysuria and feels "bad". No fevers or chills. No upper tract screening done.   I reviewed hospital notes and labs.    PMH: Past Medical History:  Diagnosis Date  . Hypothyroid   . Stroke [facility])     Surgical History: Past Surgical History:  Procedure Laterality Date  . ABDOMINAL HYSTERECTOMY     age 4  . BREAST BIOPSY Left 2000?   neg. dr. Dwyane Luo office  . CHOLECYSTECTOMY  1964  . COLONOSCOPY     Dr Nicolasa Ducking  . DILATION AND CURETTAGE OF UTERUS    . EYE SURGERY    . SKIN CANCER EXCISION     face    Home Medications:  Allergies as of 11/14/2017      Reactions   Neosporin [neomycin-polymyxin-gramicidin] Other (See Comments)   Pt states wounds do not heal with neosporin   Statins Other (See Comments)   Leg cramps      Medication List        Accurate as of 11/14/17  9:09 AM. Always use your most recent med list.          cephALEXin 500 MG capsule Commonly known as:  KEFLEX Take 1 capsule (500 mg total) by mouth every 8 (eight) hours.   clopidogrel 75 MG tablet Commonly known as:  PLAVIX Take 1 tablet (75 mg total) by mouth daily.   diphenhydramine-acetaminophen 25-500 MG Tabs tablet Commonly  known as:  TYLENOL PM Take 1 tablet by mouth at bedtime as needed.   esomeprazole 20 MG capsule Commonly known as:  NEXIUM TAKE 1 CAPSULE DAILY AT 12 NOON   levothyroxine 50 MCG tablet Commonly known as:  SYNTHROID, LEVOTHROID Take 1 tablet (50 mcg total) by mouth at bedtime.   mirabegron ER 50 MG Tb24 tablet Commonly known as:  MYRBETRIQ Take 1 tablet (50 mg total) by mouth daily.   PSYLLIUM PO Take 6 tablets by mouth at bedtime. 100%   RED YEAST RICE PO Take 1 tablet by mouth daily.   STOOL SOFTENER PO Take 100 mg by mouth daily.   TYLENOL ARTHRITIS PAIN 650 MG CR tablet Generic drug:  acetaminophen Take 650 mg by mouth every 8 (eight) hours as needed for pain.       Allergies:  Allergies  Allergen Reactions  . Neosporin [Neomycin-Polymyxin-Gramicidin] Other (See Comments)    Pt states wounds do not heal with neosporin  . Statins Other (See Comments)    Leg cramps    Family History: Family History  Problem Relation Age of Onset  . Cancer Father        prostate  . Stroke Father   . Hypertension Sister   . Hyperlipidemia  Daughter   . Heart disease Son   . Diabetes Maternal Grandmother   . Heart disease Maternal Grandmother   . Heart disease Maternal Grandfather   . Heart disease Daughter   . Breast cancer Neg Hx     Social History:  reports that  has never smoked. she has never used smokeless tobacco. She reports that she does not drink alcohol or use drugs.  ROS:                                        Physical Exam: There were no vitals taken for this visit.  Constitutional:  Alert and oriented, No acute distress. She looks well.  HEENT: [place] AT, moist mucus membranes.  Trachea midline, no masses. Cardiovascular: No clubbing, cyanosis, or edema. Respiratory: Normal respiratory effort, no increased work of breathing. GI: Abdomen is soft, nontender, nondistended, no abdominal masses GU: No CVA tenderness. Skin: No rashes,  bruises or suspicious lesions. Neurologic: Grossly intact, no focal deficits, moving all 4 extremities. Psychiatric: Normal mood and affect.  Laboratory Data: Lab Results  Component Value Date   WBC 9.8 11/06/2017   HGB 12.5 11/06/2017   HCT 36.7 11/06/2017   MCV 95.4 11/06/2017   PLT 210 11/06/2017    Lab Results  Component Value Date   CREATININE 0.96 11/06/2017     Lab Results  Component Value Date   HGBA1C 5.8 02/15/2016    Urinalysis Lab Results  Component Value Date   SPECGRAV 1.010 10/24/2017   PHUR 6.5 10/24/2017   COLORU Yellow 10/24/2017   APPEARANCEUR CLOUDY (A) 11/04/2017   LEUKOCYTESUR LARGE (A) 11/04/2017   PROTEINUR 100 (A) 11/04/2017   GLUCOSEU NEGATIVE 11/04/2017   KETONESU Negative 10/24/2017   RBCU 6-30 11/04/2017   BILIRUBINUR NEGATIVE 11/04/2017   UUROB 0.2 10/24/2017   NITRITE NEGATIVE 11/04/2017    Lab Results  Component Value Date   LABMICR See below: 10/24/2017   WBCUA 11-30 (A) 10/24/2017   RBCUA 3-10 (A) 10/24/2017   LABEPIT >10 (A) 10/24/2017   BACTERIA RARE (A) 11/04/2017     Assessment & Plan:   1. Recurrent UTI - She continues cephalexin. Screen GU tract with [place]. Discussed if she has worsening dysuria, malaise or develops fever or chills to seek medical attention immediately. Do not delay.  - Urinalysis, Complete  2. Mixed incontinence - she'll stop Myrbetriq for now. Discussed nature r/b/a of anticholinergics. I sent Rx for Vesicare 5 mg.      No Follow-up on file.  Festus Aloe, MD  [facility] [address], [place], [place] [address] [phone]

## 2017-12-05 ENCOUNTER — Ambulatory Visit (INDEPENDENT_AMBULATORY_CARE_PROVIDER_SITE_OTHER): Payer: Medicare Other | Admitting: Urology

## 2017-12-05 ENCOUNTER — Encounter: Payer: Self-pay | Admitting: Urology

## 2017-12-05 VITALS — BP 118/65 | HR 85 | Ht 60.0 in | Wt 185.0 lb

## 2017-12-05 DIAGNOSIS — R350 Frequency of micturition: Secondary | ICD-10-CM

## 2017-12-05 LAB — URINALYSIS, COMPLETE
BILIRUBIN UA: NEGATIVE
Glucose, UA: NEGATIVE
KETONES UA: NEGATIVE
Nitrite, UA: NEGATIVE
PROTEIN UA: NEGATIVE
SPEC GRAV UA: 1.01 (ref 1.005–1.030)
Urobilinogen, Ur: 0.2 mg/dL (ref 0.2–1.0)
pH, UA: 6 (ref 5.0–7.5)

## 2017-12-05 LAB — MICROSCOPIC EXAMINATION

## 2017-12-05 MED ORDER — FLUCONAZOLE 100 MG PO TABS: 100.0000 mg | ORAL_TABLET | Freq: Every day | ORAL | 0 refills | Status: DC

## 2017-12-05 NOTE — Progress Notes (Signed)
12/05/2017 2:47 PM   Sarah Freeman [place] [birth year] [id number]  Referring provider: Kathrine Haddock, NP [address], [place] [address]  Chief Complaint  Patient presents with  . Urinary Frequency    HPI: I was consulted to assess the patient's worsening urinary incontinence over many months.  She may have a bladder infection now.  She leaks with coughing sneezing bending and lifting.  She has urge incontinence and no bedwetting.  She wears 1 pad a day.  She will wear 2 pads if active.  She soaks more urine if she has urgency.  She voids 4 or 5 times at night.  She voids every 2 hours during the day.  She does not take a diuretic and denies ankle edema  She had a TIA 1 year ago.  She has had a hysterectomy.  She gets one bladder infection the year and is prone to yeast infections.  She thinks she is still infected with her typical symptoms of burning itching and burning near the pubic bone.  She finds that pads are irritating to the introitus  Some of the details of her history were more difficult to ascertain  She is prone to constipation and takes a laxative.   She had a urine culture positive on October 05, 2017  Well supported bladder neck with no stress incontinence.  She had no significant prolapse.  It is difficult to say if she had a yeast infection because she has been using an ointment but she did have white discharge around the urethra and she was a bit tender  The patient has mixed incontinence.  She has significant nocturia.  She clinically may be infected.  Based upon the pelvic examination I gave her 3 days of Diflucan.  I sent the urine for culture.  I will call if positive.  I started her on Myrbetriq 50 mg samples and prescription.  I thought it was reasonable not to order cystoscopy or urodynamics yet.  I may cystoscope her and order an ultrasound if the culture is positive  Today The patient was admitted with a urinary tract infection and positive blood culture.  It  was treated.  Myrbetriq did not help.  She was switched to Vesicare 5 mg.  Renal ultrasound was never ordered  She still feels itchiness in the vaginal area and by history never had a Diflucan sent in.  She no longer leaks in the daytime and I think is going less frequently but still goes every 2 hours at night        PMH: Past Medical History:  Diagnosis Date  . Hypothyroid   . Stroke [facility])     Surgical History: Past Surgical History:  Procedure Laterality Date  . ABDOMINAL HYSTERECTOMY     age 84  . BREAST BIOPSY Left 2000?   neg. dr. Dwyane Luo office  . CHOLECYSTECTOMY  1964  . COLONOSCOPY     Dr Nicolasa Ducking  . DILATION AND CURETTAGE OF UTERUS    . EYE SURGERY    . SKIN CANCER EXCISION     face    Home Medications:  Allergies as of 12/05/2017      Reactions   Neosporin [neomycin-polymyxin-gramicidin] Other (See Comments)   Pt states wounds do not heal with neosporin   Statins Other (See Comments)   Leg cramps      Medication List        Accurate as of 12/05/17  2:47 PM. Always use your most recent med list.  clopidogrel 75 MG tablet Commonly known as:  PLAVIX Take 1 tablet (75 mg total) by mouth daily.   diphenhydramine-acetaminophen 25-500 MG Tabs tablet Commonly known as:  TYLENOL PM Take 1 tablet by mouth at bedtime as needed.   esomeprazole 20 MG capsule Commonly known as:  NEXIUM TAKE 1 CAPSULE DAILY AT 12 NOON   fluconazole 100 MG tablet Commonly known as:  DIFLUCAN Take 1 tablet (100 mg total) by mouth daily. X 7 days   levothyroxine 50 MCG tablet Commonly known as:  SYNTHROID, LEVOTHROID Take 1 tablet (50 mcg total) by mouth at bedtime.   PSYLLIUM PO Take 6 tablets by mouth at bedtime. 100%   RED YEAST RICE PO Take 1 tablet by mouth daily.   solifenacin 5 MG tablet Commonly known as:  VESICARE Take 1 tablet (5 mg total) by mouth daily.   TYLENOL ARTHRITIS PAIN 650 MG CR tablet Generic drug:  acetaminophen Take 650 mg by mouth  every 8 (eight) hours as needed for pain.       Allergies:  Allergies  Allergen Reactions  . Neosporin [Neomycin-Polymyxin-Gramicidin] Other (See Comments)    Pt states wounds do not heal with neosporin  . Statins Other (See Comments)    Leg cramps    Family History: Family History  Problem Relation Age of Onset  . Cancer Father        prostate  . Stroke Father   . Hypertension Sister   . Hyperlipidemia Daughter   . Heart disease Son   . Diabetes Maternal Grandmother   . Heart disease Maternal Grandmother   . Heart disease Maternal Grandfather   . Heart disease Daughter   . Breast cancer Neg Hx     Social History:  reports that  has never smoked. she has never used smokeless tobacco. She reports that she does not drink alcohol or use drugs.  ROS: UROLOGY Frequent Urination?: Yes Hard to postpone urination?: Yes Burning/pain with urination?: No Get up at night to urinate?: Yes Leakage of urine?: Yes Urine stream starts and stops?: No Trouble starting stream?: No Do you have to strain to urinate?: No Blood in urine?: No Urinary tract infection?: No Sexually transmitted disease?: No Injury to kidneys or bladder?: No Painful intercourse?: No Weak stream?: No Currently pregnant?: No Vaginal bleeding?: No Last menstrual period?: n  Gastrointestinal Nausea?: No Vomiting?: No Indigestion/heartburn?: No Diarrhea?: No Constipation?: No  Constitutional Fever: No Night sweats?: No Weight loss?: No Fatigue?: No  Skin Skin rash/lesions?: Yes Itching?: Yes  Eyes Blurred vision?: No Double vision?: No  Ears/Nose/Throat Sore throat?: No Sinus problems?: No  Hematologic/Lymphatic Swollen glands?: No Easy bruising?: No  Cardiovascular Leg swelling?: No Chest pain?: No  Respiratory Cough?: No Shortness of breath?: No  Endocrine Excessive thirst?: No  Musculoskeletal Back pain?: No Joint pain?: No  Neurological Headaches?: No Dizziness?:  No  Psychologic Depression?: No Anxiety?: No  Physical Exam: BP 118/65   Pulse 85   Ht 5' (1.524 m)   Wt 185 lb (83.9 kg)   BMI 36.13 kg/m   Constitutional:  Alert and oriented, No acute distress.  Laboratory Data: Lab Results  Component Value Date   WBC 9.8 11/06/2017   HGB 12.5 11/06/2017   HCT 36.7 11/06/2017   MCV 95.4 11/06/2017   PLT 210 11/06/2017    Lab Results  Component Value Date   CREATININE 0.96 11/06/2017    No results found for: PSA  No results found for: TESTOSTERONE  Lab Results  Component Value Date   HGBA1C 5.8 02/15/2016    Urinalysis    Component Value Date/Time   COLORURINE YELLOW (A) 11/04/2017 1752   APPEARANCEUR Cloudy (A) 11/14/2017 0911   LABSPEC 1.017 11/04/2017 1752   PHURINE 5.0 11/04/2017 1752   GLUCOSEU Negative 11/14/2017 0911   HGBUR MODERATE (A) 11/04/2017 1752   BILIRUBINUR Negative 11/14/2017 0911   KETONESUR 5 (A) 11/04/2017 1752   PROTEINUR Negative 11/14/2017 0911   PROTEINUR 100 (A) 11/04/2017 1752   NITRITE Negative 11/14/2017 0911   NITRITE NEGATIVE 11/04/2017 1752   LEUKOCYTESUR Trace (A) 11/14/2017 0911    Pertinent Imaging:   Assessment & Plan: The patient will stay on Vesicare and currently it is $110.  I am not going to order an ultrasound yet or put her on prophylaxis unless we get more cultures that are positive.  Urine was sent for culture today for some vague intermittent dysuria.  Diflucan for 3 days sent.  Reassess in about 5 or 6 weeks on Vesicare.  Reassess burning which could be due to vaginal dryness and nighttime frequency.  She may be a good candidate for desmopressin but I did not want to add this on today  1. Urinary frequency Nighttime frequency - Urinalysis, Complete   No Follow-up on file.  Reece Packer, MD  [facility] [address] [place], [place] [address] [phone]

## 2017-12-08 ENCOUNTER — Telehealth: Payer: Self-pay

## 2017-12-08 LAB — CULTURE, URINE COMPREHENSIVE

## 2017-12-08 MED ORDER — NITROFURANTOIN MONOHYD MACRO 100 MG PO CAPS: 100.0000 mg | ORAL_CAPSULE | Freq: Two times a day (BID) | ORAL | 0 refills | Status: DC

## 2017-12-08 NOTE — Telephone Encounter (Signed)
Patient notified and script sent 

## 2017-12-08 NOTE — Telephone Encounter (Signed)
-----   Message from Bjorn Loser, MD sent at 12/08/2017  2:49 PM EST ----- Macrodantin 100 mg twice a day for 1 week for positive urinary tract infection   ----- Message ----- From: Royanne Foots, CMA Sent: 12/08/2017   2:14 PM To: Bjorn Loser, MD    ----- Message ----- From: Interface, [facility] Lab Results In Sent: 12/05/2017   4:38 PM To: Rowe Robert Clinical

## 2017-12-12 ENCOUNTER — Telehealth: Payer: Self-pay

## 2017-12-12 NOTE — Telephone Encounter (Signed)
Patient notified and script previously sent

## 2017-12-12 NOTE — Telephone Encounter (Signed)
-----   Message from Bjorn Loser, MD sent at 12/12/2017 11:22 AM EST ----- Macrodantin 100 mg bid for 7 days    ----- Message ----- From: Royanne Foots, CMA Sent: 12/08/2017   2:14 PM To: Bjorn Loser, MD    ----- Message ----- From: Interface, [facility] Lab Results In Sent: 12/05/2017   4:38 PM To: Rowe Robert Clinical

## 2017-12-19 ENCOUNTER — Ambulatory Visit: Payer: Medicare Other

## 2017-12-23 ENCOUNTER — Telehealth: Payer: Self-pay | Admitting: Unknown Physician Specialty

## 2017-12-23 ENCOUNTER — Other Ambulatory Visit: Payer: Self-pay

## 2017-12-23 MED ORDER — CLOPIDOGREL BISULFATE 75 MG PO TABS: 75.0000 mg | ORAL_TABLET | Freq: Every day | ORAL | 0 refills | Status: DC

## 2017-12-23 MED ORDER — LEVOTHYROXINE SODIUM 50 MCG PO TABS: 50.0000 ug | ORAL_TABLET | Freq: Every day | ORAL | 0 refills | Status: DC

## 2017-12-23 MED ORDER — ESOMEPRAZOLE MAGNESIUM 20 MG PO CPDR: DELAYED_RELEASE_CAPSULE | ORAL | 0 refills | Status: DC

## 2017-12-23 NOTE — Telephone Encounter (Signed)
Copied from [place]. Topic: Quick Communication - Rx Refill/Question >> Dec 23, 2017 10:36 AM Robina Ade, Helene Kelp D wrote: Medication: clopidogrel (PLAVIX) 75 MG tablet, levothyroxine (SYNTHROID, LEVOTHROID) 50 MCG tablet and esomeprazole (NEXIUM) 20 MG capsule  Has the patient contacted their pharmacy? Yes   (Agent: If no, request that the patient contact the pharmacy for the refill.)   Preferred Pharmacy (with phone number or street name): [facility], [place]   Agent: Please be advised that RX refills may take up to 3 business days. We ask that you follow-up with your pharmacy.

## 2017-12-26 ENCOUNTER — Telehealth: Payer: Self-pay | Admitting: Urology

## 2018-01-02 ENCOUNTER — Encounter: Payer: Self-pay | Admitting: Urology

## 2018-01-02 ENCOUNTER — Ambulatory Visit (INDEPENDENT_AMBULATORY_CARE_PROVIDER_SITE_OTHER): Payer: Medicare Other | Admitting: Urology

## 2018-01-02 VITALS — BP 114/71 | HR 78 | Ht 60.0 in | Wt 186.8 lb

## 2018-01-02 DIAGNOSIS — R300 Dysuria: Secondary | ICD-10-CM | POA: Diagnosis not present

## 2018-01-02 DIAGNOSIS — N390 Urinary tract infection, site not specified: Secondary | ICD-10-CM | POA: Diagnosis not present

## 2018-01-02 LAB — URINALYSIS, COMPLETE
BILIRUBIN UA: NEGATIVE
GLUCOSE, UA: NEGATIVE
Ketones, UA: NEGATIVE
Nitrite, UA: NEGATIVE
PH UA: 6 (ref 5.0–7.5)
PROTEIN UA: NEGATIVE
Specific Gravity, UA: 1.01 (ref 1.005–1.030)
UUROB: 0.2 mg/dL (ref 0.2–1.0)

## 2018-01-02 LAB — MICROSCOPIC EXAMINATION
RBC, UA: NONE SEEN /hpf (ref 0–?)
WBC, UA: >30 /hpf — ABNORMAL HIGH (ref 0–?)

## 2018-01-02 MED ORDER — TRIMETHOPRIM 100 MG PO TABS: 100.0000 mg | ORAL_TABLET | Freq: Every day | ORAL | 11 refills | Status: DC

## 2018-01-02 NOTE — Progress Notes (Signed)
01/02/2018 8:46 AM   Sarah Freeman [place] Dec 30, 1930 [id number]  Referring provider: Kathrine Haddock, NP [address], [place] [address]  No chief complaint on file.   HPI:  was consulted to assess the patient's worsening urinary incontinence over many months. She may have a bladder infection now. She leaks with coughing sneezing bending and lifting. She has urge incontinence and no bedwetting. She wears 1 pad a day. She will wear 2 pads if active. She soaks more urine if she has urgency.  She voids 4 or 5 times at night. She voids every 2 hours during the day. She does not take a diuretic and denies ankle edema  She had a TIA 1 year ago. She has had a hysterectomy. She gets one bladder infection the year and is prone to yeast infections. She thinks she is still infected with her typical symptoms of burning itching and burning near the pubic bone. She finds that pads are irritating to the introitus  Some of the details of her history were more difficult to ascertain  She had a urine culture positive on October 05, 2017  Well supported bladder neck with no stress incontinence. She had no significant prolapse.   The patient has mixed incontinence. She has significant nocturia.   The patient was admitted with a urinary tract infection and positive blood culture.  It was treated.  Myrbetriq did not help.  She was switched to Vesicare 5 mg.  Renal ultrasound was never ordered  She still feels itchiness in the vaginal area and by history never had a Diflucan sent in.  She no longer leaks in the daytime and I think is going less frequently but still goes every 2 hours at night  The patient will stay on Vesicare and currently it is $110.  I am not going to order an ultrasound yet or put her on prophylaxis unless we get more cultures that are positive.  Urine was sent for culture today for some vague intermittent dysuria.  Diflucan for 3 days sent.  Reassess in about 5 or 6  weeks on Vesicare.  Reassess burning which could be due to vaginal dryness and nighttime frequency.  She may be a good candidate for desmopressin but I did not want to add this on today  Today The patient's last urine culture was positive.  Clinically the patient has some nonspecific suprapubic discomfort today but probably is not infected.  I did send a urine for culture.  She really believes Vesicare was helping.  She thought it was causing side effects but she likely had sinusitis and wants to start it again    PMH: Past Medical History:  Diagnosis Date  . Hypothyroid   . Stroke [facility])     Surgical History: Past Surgical History:  Procedure Laterality Date  . ABDOMINAL HYSTERECTOMY     age 84  . BREAST BIOPSY Left 2000?   neg. dr. Dwyane Luo office  . CHOLECYSTECTOMY  1964  . COLONOSCOPY     Dr Nicolasa Ducking  . DILATION AND CURETTAGE OF UTERUS    . EYE SURGERY    . SKIN CANCER EXCISION     face    Home Medications:  Allergies as of 01/02/2018      Reactions   Neosporin [neomycin-polymyxin-gramicidin] Other (See Comments)   Pt states wounds do not heal with neosporin   Statins Other (See Comments)   Leg cramps      Medication List        Accurate  as of 01/02/18  8:46 AM. Always use your most recent med list.          clopidogrel 75 MG tablet Commonly known as:  PLAVIX Take 1 tablet (75 mg total) by mouth daily.   diphenhydramine-acetaminophen 25-500 MG Tabs tablet Commonly known as:  TYLENOL PM Take 1 tablet by mouth at bedtime as needed.   esomeprazole 20 MG capsule Commonly known as:  NEXIUM TAKE 1 CAPSULE DAILY AT 12 NOON   levothyroxine 50 MCG tablet Commonly known as:  SYNTHROID, LEVOTHROID Take 1 tablet (50 mcg total) by mouth at bedtime.   PSYLLIUM PO Take 6 tablets by mouth at bedtime. 100%   RED YEAST RICE PO Take 1 tablet by mouth daily.   solifenacin 5 MG tablet Commonly known as:  VESICARE Take 1 tablet (5 mg total) by mouth daily.   TYLENOL  ARTHRITIS PAIN 650 MG CR tablet Generic drug:  acetaminophen Take 650 mg by mouth every 8 (eight) hours as needed for pain.       Allergies:  Allergies  Allergen Reactions  . Neosporin [Neomycin-Polymyxin-Gramicidin] Other (See Comments)    Pt states wounds do not heal with neosporin  . Statins Other (See Comments)    Leg cramps    Family History: Family History  Problem Relation Age of Onset  . Cancer Father        prostate  . Stroke Father   . Hypertension Sister   . Hyperlipidemia Daughter   . Heart disease Son   . Diabetes Maternal Grandmother   . Heart disease Maternal Grandmother   . Heart disease Maternal Grandfather   . Heart disease Daughter   . Breast cancer Neg Hx     Social History:  reports that  has never smoked. she has never used smokeless tobacco. She reports that she does not drink alcohol or use drugs.  ROS: UROLOGY Frequent Urination?: Yes Hard to postpone urination?: Yes Burning/pain with urination?: Yes Get up at night to urinate?: Yes Leakage of urine?: Yes Urine stream starts and stops?: No Trouble starting stream?: No Do you have to strain to urinate?: No Blood in urine?: No Urinary tract infection?: No Sexually transmitted disease?: No Injury to kidneys or bladder?: No Painful intercourse?: No Weak stream?: No Currently pregnant?: No Vaginal bleeding?: No Last menstrual period?: n  Gastrointestinal Nausea?: No Vomiting?: No Indigestion/heartburn?: No Diarrhea?: No Constipation?: No  Constitutional Fever: No Night sweats?: No Weight loss?: No Fatigue?: No  Skin Skin rash/lesions?: No Itching?: No  Eyes Blurred vision?: No Double vision?: No  Ears/Nose/Throat Sore throat?: No Sinus problems?: No  Hematologic/Lymphatic Swollen glands?: No Easy bruising?: No  Cardiovascular Leg swelling?: No Chest pain?: No  Respiratory Cough?: No Shortness of breath?: No  Endocrine Excessive thirst?:  No  Musculoskeletal Back pain?: No Joint pain?: No  Neurological Headaches?: No Dizziness?: No  Psychologic Depression?: No Anxiety?: No  Physical Exam: BP 114/71 (BP Location: Right Arm, Patient Position: Sitting, Cuff Size: Normal)   Pulse 78   Ht 5' (1.524 m)   Wt 186 lb 12.8 oz (84.7 kg)   BMI 36.48 kg/m   Constitutional:  Alert and oriented, No acute distress.   Laboratory Data: Lab Results  Component Value Date   WBC 9.8 11/06/2017   HGB 12.5 11/06/2017   HCT 36.7 11/06/2017   MCV 95.4 11/06/2017   PLT 210 11/06/2017    Lab Results  Component Value Date   CREATININE 0.96 11/06/2017    No results  found for: PSA  No results found for: TESTOSTERONE  Lab Results  Component Value Date   HGBA1C 5.8 02/15/2016    Urinalysis    Component Value Date/Time   COLORURINE YELLOW (A) 11/04/2017 1752   APPEARANCEUR Cloudy (A) 12/05/2017 1436   LABSPEC 1.017 11/04/2017 1752   PHURINE 5.0 11/04/2017 1752   GLUCOSEU Negative 12/05/2017 1436   HGBUR MODERATE (A) 11/04/2017 1752   BILIRUBINUR Negative 12/05/2017 1436   KETONESUR 5 (A) 11/04/2017 1752   PROTEINUR Negative 12/05/2017 1436   PROTEINUR 100 (A) 11/04/2017 1752   NITRITE Negative 12/05/2017 1436   NITRITE NEGATIVE 11/04/2017 1752   LEUKOCYTESUR 3+ (A) 12/05/2017 1436    Pertinent Imaging:   Assessment & Plan: The patient will start on daily trimethoprim.  I will call if the urine culture is positive.  She will get a baseline renal ultrasound.  I will see her in 2 months on Vesicare and trimethoprim.  1. Dysuria 2.  Urinary tract infection, unspecified 3.  Urgency incontinence  - Urinalysis, Complete   No Follow-up on file.  Reece Packer, MD  [facility] [address], [place], [place] [address] [phone]

## 2018-01-06 LAB — CULTURE, URINE COMPREHENSIVE

## 2018-01-11 ENCOUNTER — Telehealth: Payer: Self-pay | Admitting: Family Medicine

## 2018-01-11 MED ORDER — NITROFURANTOIN MACROCRYSTAL 100 MG PO CAPS: 100.0000 mg | ORAL_CAPSULE | Freq: Four times a day (QID) | ORAL | 0 refills | Status: DC

## 2018-01-11 NOTE — Telephone Encounter (Signed)
-----   Message from Bjorn Loser, MD sent at 01/09/2018  8:34 AM EST ----- Macrodantin 100 mg twice a day for 1 week; then restart daily trimethoprim     ----- Message ----- From: Kyra Manges, CMA Sent: 01/06/2018   8:33 AM To: Bjorn Loser, MD    ----- Message ----- From: Interface, [facility] Lab Results In Sent: 01/02/2018   1:36 PM To: Rowe Robert Clinical

## 2018-01-16 ENCOUNTER — Ambulatory Visit: Payer: Medicare Other

## 2018-01-16 ENCOUNTER — Ambulatory Visit
Admission: RE | Admit: 2018-01-16 | Discharge: 2018-01-16 | Disposition: A | Discharge status: Home / Self Care | Payer: Medicare Other | Source: Ambulatory Visit | Attending: Urology | Admitting: Urology

## 2018-01-16 DIAGNOSIS — N390 Urinary tract infection, site not specified: Secondary | ICD-10-CM | POA: Diagnosis present

## 2018-01-18 ENCOUNTER — Observation Stay
Admit: 2018-01-18 | Discharge: 2018-01-18 | Disposition: A | Discharge status: Home / Self Care | Payer: Medicare Other | Attending: Internal Medicine | Admitting: Internal Medicine

## 2018-01-18 ENCOUNTER — Other Ambulatory Visit: Payer: Self-pay

## 2018-01-18 ENCOUNTER — Encounter: Payer: Self-pay | Admitting: Emergency Medicine

## 2018-01-18 ENCOUNTER — Observation Stay: Admit: 2018-01-18 | Payer: Medicare Other

## 2018-01-18 ENCOUNTER — Observation Stay: Payer: Medicare Other

## 2018-01-18 ENCOUNTER — Observation Stay
Admission: EM | Admit: 2018-01-18 | Discharge: 2018-01-19 | Disposition: A | Discharge status: Home / Self Care | Payer: Medicare Other | Attending: Family Medicine | Admitting: Family Medicine

## 2018-01-18 ENCOUNTER — Emergency Department: Payer: Medicare Other

## 2018-01-18 DIAGNOSIS — K219 Gastro-esophageal reflux disease without esophagitis: Secondary | ICD-10-CM | POA: Insufficient documentation

## 2018-01-18 DIAGNOSIS — I639 Cerebral infarction, unspecified: Principal | ICD-10-CM | POA: Insufficient documentation

## 2018-01-18 DIAGNOSIS — Z7902 Long term (current) use of antithrombotics/antiplatelets: Secondary | ICD-10-CM | POA: Diagnosis not present

## 2018-01-18 DIAGNOSIS — R7301 Impaired fasting glucose: Secondary | ICD-10-CM | POA: Diagnosis not present

## 2018-01-18 DIAGNOSIS — Z79890 Hormone replacement therapy (postmenopausal): Secondary | ICD-10-CM | POA: Diagnosis not present

## 2018-01-18 DIAGNOSIS — I700 Atherosclerosis of aorta: Secondary | ICD-10-CM | POA: Diagnosis not present

## 2018-01-18 DIAGNOSIS — Z66 Do not resuscitate: Secondary | ICD-10-CM | POA: Insufficient documentation

## 2018-01-18 DIAGNOSIS — Z85828 Personal history of other malignant neoplasm of skin: Secondary | ICD-10-CM | POA: Insufficient documentation

## 2018-01-18 DIAGNOSIS — E039 Hypothyroidism, unspecified: Secondary | ICD-10-CM | POA: Diagnosis not present

## 2018-01-18 DIAGNOSIS — R32 Unspecified urinary incontinence: Secondary | ICD-10-CM | POA: Diagnosis not present

## 2018-01-18 DIAGNOSIS — R278 Other lack of coordination: Secondary | ICD-10-CM

## 2018-01-18 DIAGNOSIS — Z8673 Personal history of transient ischemic attack (TIA), and cerebral infarction without residual deficits: Secondary | ICD-10-CM | POA: Diagnosis not present

## 2018-01-18 DIAGNOSIS — R200 Anesthesia of skin: Secondary | ICD-10-CM | POA: Diagnosis present

## 2018-01-18 DIAGNOSIS — Z888 Allergy status to other drugs, medicaments and biological substances status: Secondary | ICD-10-CM | POA: Diagnosis not present

## 2018-01-18 DIAGNOSIS — R29898 Other symptoms and signs involving the musculoskeletal system: Secondary | ICD-10-CM

## 2018-01-18 DIAGNOSIS — N390 Urinary tract infection, site not specified: Secondary | ICD-10-CM | POA: Insufficient documentation

## 2018-01-18 DIAGNOSIS — Z79899 Other long term (current) drug therapy: Secondary | ICD-10-CM | POA: Insufficient documentation

## 2018-01-18 HISTORY — DX: Unspecified urinary incontinence: R32

## 2018-01-18 LAB — COMPREHENSIVE METABOLIC PANEL
ALT: 16 U/L (ref 14–54)
AST: 20 U/L (ref 15–41)
Albumin: 3.7 g/dL (ref 3.5–5.0)
Alkaline Phosphatase: 46 U/L (ref 38–126)
Anion gap: 8 (ref 5–15)
BUN: 7 mg/dL (ref 6–20)
CHLORIDE: 103 mmol/L (ref 101–111)
CO2: 26 mmol/L (ref 22–32)
Calcium: 8.9 mg/dL (ref 8.9–10.3)
Creatinine, Ser: 1.11 mg/dL — ABNORMAL HIGH (ref 0.44–1.00)
GFR, EST AFRICAN AMERICAN: 51 mL/min — AB (ref >60)
GFR, EST NON AFRICAN AMERICAN: 44 mL/min — AB (ref >60)
Glucose, Bld: 116 mg/dL — ABNORMAL HIGH (ref 65–99)
POTASSIUM: 4.2 mmol/L (ref 3.5–5.1)
Sodium: 137 mmol/L (ref 135–145)
Total Bilirubin: 0.6 mg/dL (ref 0.3–1.2)
Total Protein: 7.4 g/dL (ref 6.5–8.1)

## 2018-01-18 LAB — DIFFERENTIAL
BASOS ABS: 0.1 10*3/uL (ref 0–0.1)
BASOS PCT: 2 %
EOS ABS: 0.4 10*3/uL (ref 0–0.7)
Eosinophils Relative: 6 %
Lymphocytes Relative: 39 %
Lymphs Abs: 2.6 10*3/uL (ref 1.0–3.6)
MONO ABS: 0.7 10*3/uL (ref 0.2–0.9)
MONOS PCT: 11 %
Neutro Abs: 2.9 10*3/uL (ref 1.4–6.5)
Neutrophils Relative %: 42 %

## 2018-01-18 LAB — URINE DRUG SCREEN, QUALITATIVE (ARMC ONLY)
Amphetamines, Ur Screen: NOT DETECTED
BARBITURATES, UR SCREEN: NOT DETECTED
BENZODIAZEPINE, UR SCRN: NOT DETECTED
CANNABINOID 50 NG, UR ~~LOC~~: NOT DETECTED
Cocaine Metabolite,Ur ~~LOC~~: NOT DETECTED
MDMA (Ecstasy)Ur Screen: NOT DETECTED
Methadone Scn, Ur: NOT DETECTED
Opiate, Ur Screen: NOT DETECTED
PHENCYCLIDINE (PCP) UR S: NOT DETECTED
TRICYCLIC, UR SCREEN: NOT DETECTED

## 2018-01-18 LAB — CBC
HEMATOCRIT: 40.1 % (ref 35.0–47.0)
Hemoglobin: 13.4 g/dL (ref 12.0–16.0)
MCH: 32.2 pg (ref 26.0–34.0)
MCHC: 33.4 g/dL (ref 32.0–36.0)
MCV: 96.3 fL (ref 80.0–100.0)
Platelets: 344 10*3/uL (ref 150–440)
RBC: 4.16 MIL/uL (ref 3.80–5.20)
RDW: 13.3 % (ref 11.5–14.5)
WBC: 6.7 10*3/uL (ref 3.6–11.0)

## 2018-01-18 LAB — URINALYSIS, ROUTINE W REFLEX MICROSCOPIC
Bacteria, UA: NONE SEEN
Bilirubin Urine: NEGATIVE
GLUCOSE, UA: NEGATIVE mg/dL
HGB URINE DIPSTICK: NEGATIVE
Ketones, ur: NEGATIVE mg/dL
NITRITE: NEGATIVE
Protein, ur: NEGATIVE mg/dL
Specific Gravity, Urine: 1.006 (ref 1.005–1.030)
pH: 7 (ref 5.0–8.0)

## 2018-01-18 LAB — HEMOGLOBIN A1C
Hgb A1c MFr Bld: 6.1 % — ABNORMAL HIGH (ref 4.8–5.6)
Mean Plasma Glucose: 128.37 mg/dL

## 2018-01-18 LAB — TROPONIN I

## 2018-01-18 LAB — APTT: APTT: 27 s (ref 24–36)

## 2018-01-18 LAB — GLUCOSE, CAPILLARY: GLUCOSE-CAPILLARY: 97 mg/dL (ref 65–99)

## 2018-01-18 LAB — PROTIME-INR
INR: 1
PROTHROMBIN TIME: 13.1 s (ref 11.4–15.2)

## 2018-01-18 MED ORDER — DARIFENACIN HYDROBROMIDE ER 7.5 MG PO TB24
7.5000 mg | ORAL_TABLET | Freq: Every day | ORAL | Status: DC
  Administered 2018-01-19: 7.5 mg via ORAL
  Filled 2018-01-18: qty 1

## 2018-01-18 MED ORDER — ACETAMINOPHEN 500 MG PO TABS: 500.0000 mg | ORAL_TABLET | Freq: Every evening | ORAL | Status: DC | PRN

## 2018-01-18 MED ORDER — ACETAMINOPHEN 650 MG RE SUPP: 650.0000 mg | Freq: Four times a day (QID) | RECTAL | Status: DC | PRN

## 2018-01-18 MED ORDER — ENOXAPARIN SODIUM 40 MG/0.4ML ~~LOC~~ SOLN
40.0000 mg | SUBCUTANEOUS | Status: DC
  Administered 2018-01-18: 40 mg via SUBCUTANEOUS
  Filled 2018-01-18: qty 0.4

## 2018-01-18 MED ORDER — ACETAMINOPHEN 325 MG PO TABS: 650.0000 mg | ORAL_TABLET | Freq: Four times a day (QID) | ORAL | Status: DC | PRN

## 2018-01-18 MED ORDER — ASPIRIN EC 81 MG PO TBEC
81.0000 mg | DELAYED_RELEASE_TABLET | Freq: Every day | ORAL | Status: DC
  Administered 2018-01-18 – 2018-01-19 (×2): 81 mg via ORAL
  Filled 2018-01-18 (×2): qty 1

## 2018-01-18 MED ORDER — DIPHENHYDRAMINE-APAP (SLEEP) 25-500 MG PO TABS: 1.0000 | ORAL_TABLET | Freq: Every evening | ORAL | Status: DC | PRN

## 2018-01-18 MED ORDER — LEVOTHYROXINE SODIUM 50 MCG PO TABS
50.0000 ug | ORAL_TABLET | Freq: Every day | ORAL | Status: DC
  Administered 2018-01-19: 50 ug via ORAL
  Filled 2018-01-18: qty 1

## 2018-01-18 MED ORDER — DIPHENHYDRAMINE HCL 25 MG PO CAPS: 25.0000 mg | ORAL_CAPSULE | Freq: Every evening | ORAL | Status: DC | PRN

## 2018-01-18 MED ORDER — PRAVASTATIN SODIUM 10 MG PO TABS
10.0000 mg | ORAL_TABLET | Freq: Every day | ORAL | Status: DC
  Administered 2018-01-18: 10 mg via ORAL
  Filled 2018-01-18 (×3): qty 1

## 2018-01-18 MED ORDER — ONDANSETRON HCL 4 MG PO TABS: 4.0000 mg | ORAL_TABLET | Freq: Four times a day (QID) | ORAL | Status: DC | PRN

## 2018-01-18 MED ORDER — PANTOPRAZOLE SODIUM 40 MG PO TBEC
40.0000 mg | DELAYED_RELEASE_TABLET | Freq: Every day | ORAL | Status: DC
  Administered 2018-01-19: 08:00:00 40 mg via ORAL
  Filled 2018-01-18: qty 1

## 2018-01-18 MED ORDER — TRIMETHOPRIM 100 MG PO TABS
100.0000 mg | ORAL_TABLET | Freq: Every day | ORAL | Status: DC
  Administered 2018-01-19: 100 mg via ORAL
  Filled 2018-01-18 (×2): qty 1

## 2018-01-18 MED ORDER — ONDANSETRON HCL 4 MG/2ML IJ SOLN: 4.0000 mg | Freq: Four times a day (QID) | INTRAMUSCULAR | Status: DC | PRN

## 2018-01-18 MED ORDER — CLOPIDOGREL BISULFATE 75 MG PO TABS
75.0000 mg | ORAL_TABLET | Freq: Every day | ORAL | Status: DC
  Administered 2018-01-19: 75 mg via ORAL
  Filled 2018-01-18: qty 1

## 2018-01-18 MED ORDER — PSYLLIUM 95 % PO PACK
1.0000 | PACK | Freq: Every day | ORAL | Status: DC
  Administered 2018-01-18: 1 via ORAL
  Filled 2018-01-18 (×3): qty 1

## 2018-01-18 MED ORDER — STROKE: EARLY STAGES OF RECOVERY BOOK
Freq: Once | Status: AC
  Administered 2018-01-18: 17:00:00

## 2018-01-18 NOTE — ED Notes (Signed)
Pt states that the numbness and tingling in the arm has gone down some but is still present. Pt is A&Ox4 NAD with Family at bedside.

## 2018-01-18 NOTE — ED Triage Notes (Signed)
Here for numbness to left arm.  Pt woke at 830 this morning feeling like left arm was asleep but this resolved.  Pt has left upper extremity ataxia but no weakness noted and no arm drift.  Speech clear. No droop. Hx stroke. Last normal was 11 pm. Discussed with dr Quentin Cornwall, will not call at this time as pt is VAN negative.

## 2018-01-18 NOTE — H&P (Signed)
[facility] NAME: Sarah Freeman    MR#:  [id number]  DATE OF BIRTH:  11/11/1931  DATE OF ADMISSION:  01/18/2018  PRIMARY CARE PHYSICIAN: Kathrine Haddock, NP   REQUESTING/REFERRING PHYSICIAN: Dr Merlyn Lot  CHIEF COMPLAINT:   Chief Complaint  Patient presents with  . Numbness    HISTORY OF PRESENT ILLNESS:  Kerianne Gurr  is a 84 y.o. female with a known history of watershed infarct 2 years ago.  She presents with left arm incoordination and a numbness feeling in her forearm from when she woke up this morning.  She was trying to move around and was able to move her arm but not how she would like.  The numbness feeling has gone away by now.  She felt well prior to going to bed.  In the ER a CT scan of the head was negative.  She still had incoordination of her left arm.  PAST MEDICAL HISTORY:   Past Medical History:  Diagnosis Date  . Hypothyroid   . Stroke ([place])   . Urinary incontinence     PAST SURGICAL HISTORY:   Past Surgical History:  Procedure Laterality Date  . ABDOMINAL HYSTERECTOMY     age 53  . BREAST BIOPSY Left 2000?   neg. dr. Dwyane Luo office  . CHOLECYSTECTOMY  1964  . COLONOSCOPY     Dr Nicolasa Ducking  . DILATION AND CURETTAGE OF UTERUS    . EYE SURGERY    . SKIN CANCER EXCISION     face    SOCIAL HISTORY:   Social History   Tobacco Use  . Smoking status: Never Smoker  . Smokeless tobacco: Never Used  Substance Use Topics  . Alcohol use: No    Alcohol/week: 0.0 oz    FAMILY HISTORY:   Family History  Problem Relation Age of Onset  . Cancer Father        prostate  . Stroke Father   . Hypertension Sister   . Hyperlipidemia Daughter   . Heart disease Son   . Diabetes Maternal Grandmother   . Heart disease Maternal Grandmother   . Heart disease Maternal Grandfather   . Heart disease Daughter   . CAD Mother   . Failure to thrive Mother   . Breast cancer Neg Hx     DRUG  ALLERGIES:   Allergies  Allergen Reactions  . Neosporin [Neomycin-Polymyxin-Gramicidin] Other (See Comments)    Pt states wounds do not heal with neosporin  . Statins Other (See Comments)    Leg cramps    REVIEW OF SYSTEMS:  CONSTITUTIONAL: No fever, chills or sweats.  No fatigue.  Positive for left arm incoordination EYES: No blurred or double vision.  Wears glasses EARS, NOSE, AND THROAT: No tinnitus or ear pain. No sore throat.  Decreased hearing RESPIRATORY: No cough, shortness of breath, wheezing or hemoptysis.  CARDIOVASCULAR: No chest pain, orthopnea, edema.  GASTROINTESTINAL: No nausea, vomiting, diarrhea or abdominal pain. No blood in bowel movements.  History of constipation GENITOURINARY: No dysuria, hematuria.  ENDOCRINE: No polyuria, nocturia.  Positive for hypothyroidism HEMATOLOGY: No anemia, easy bruising or bleeding SKIN: No rash or lesion. MUSCULOSKELETAL: No joint pain or arthritis.   NEUROLOGIC: No tingling, numbness, weakness.  PSYCHIATRY: No anxiety or depression.   MEDICATIONS AT HOME:   Prior to Admission medications   Medication Sig Start Date End Date Taking? Authorizing Provider  clopidogrel (PLAVIX) 75 MG tablet Take 1 tablet (75 mg total)  by mouth daily. 12/23/17  Yes Volney American, PA-C  esomeprazole (NEXIUM) 20 MG capsule TAKE 1 CAPSULE DAILY AT 12 NOON 12/23/17  Yes Volney American, PA-C  levothyroxine (SYNTHROID, LEVOTHROID) 50 MCG tablet Take 1 tablet (50 mcg total) by mouth at bedtime. 12/23/17  Yes Volney American, PA-C  PSYLLIUM PO Take 6 tablets by mouth at bedtime. 100%    Yes [provider]  Red Yeast Rice Extract (RED YEAST RICE PO) Take 1 tablet by mouth daily.   Yes [provider]  solifenacin (VESICARE) 5 MG tablet Take 1 tablet (5 mg total) by mouth daily. 11/14/17  Yes Festus Aloe, MD  trimethoprim (TRIMPEX) 100 MG tablet Take 1 tablet (100 mg total) by mouth daily. 01/02/18  Yes MacDiarmid,  Nicki Reaper, MD  acetaminophen (TYLENOL ARTHRITIS PAIN) 650 MG CR tablet Take 650 mg by mouth every 8 (eight) hours as needed for pain.    [provider]  diphenhydramine-acetaminophen (TYLENOL PM) 25-500 MG TABS tablet Take 1 tablet by mouth at bedtime as needed.    [provider]  nitrofurantoin (MACRODANTIN) 100 MG capsule Take 1 capsule (100 mg total) by mouth 4 (four) times daily. 01/11/18   Bjorn Loser, MD      VITAL SIGNS:  Blood pressure 124/78, pulse 70, temperature 98.2 F (36.8 C), temperature source Oral, resp. rate 15, height 5' (1.524 m), weight 84.4 kg (186 lb), SpO2 100 %.  PHYSICAL EXAMINATION:  GENERAL:  84 y.o.-year-old patient lying in the bed with no acute distress.  EYES: Pupils equal, round, reactive to light and accommodation. No scleral icterus. Extraocular muscles intact.  HEENT: Head atraumatic, normocephalic. Oropharynx and nasopharynx clear.  NECK:  Supple, no jugular venous distention. No thyroid enlargement, no tenderness.  LUNGS: Normal breath sounds bilaterally, no wheezing, rales,rhonchi or crepitation. No use of accessory muscles of respiration.  CARDIOVASCULAR: S1, S2 normal. No murmurs, rubs, or gallops.  ABDOMEN: Soft, nontender, nondistended. Bowel sounds present. No organomegaly or mass.  EXTREMITIES: No pedal edema, cyanosis, or clubbing.  NEUROLOGIC: Cranial nerves II through XII are intact. Muscle strength 5/5 in all extremities. Sensation intact. Gait not checked.  Finger nose slightly impaired left arm.  Rapid hand movements slightly impaired left hand.  Heel shin intact bilaterally. PSYCHIATRIC: The patient is alert and oriented x 3.  SKIN: No rash, lesion, or ulcer.   LABORATORY PANEL:   CBC Recent Labs  Lab 01/18/18 1000  WBC 6.7  HGB 13.4  HCT 40.1  PLT 344   ------------------------------------------------------------------------------------------------------------------  Chemistries  Recent Labs  Lab  01/18/18 1000  NA 137  K 4.2  CL 103  CO2 26  GLUCOSE 116*  BUN 7  CREATININE 1.11*  CALCIUM 8.9  AST 20  ALT 16  ALKPHOS 46  BILITOT 0.6   ------------------------------------------------------------------------------------------------------------------  Cardiac Enzymes Recent Labs  Lab 01/18/18 1000  TROPONINI <0.03   ------------------------------------------------------------------------------------------------------------------  RADIOLOGY:  Ct Head Wo Contrast  Result Date: 01/18/2018 CLINICAL DATA:  Left arm numbness since waking this morning. EXAM: CT HEAD WITHOUT CONTRAST TECHNIQUE: Contiguous axial images were obtained from the base of the skull through the vertex without intravenous contrast. COMPARISON:  Brain MRI 02/16/2016 and head CT scan 02/15/2016. FINDINGS: Brain: There is atrophy and chronic microvascular ischemic change. No evidence of acute abnormality including hemorrhage, infarct, mass lesion, mass effect, midline shift or abnormal extra-axial fluid collection. No hydrocephalus or pneumocephalus. Vascular: Atherosclerosis noted. Skull: Intact. Sinuses/Orbits: Small amount of fluid right mastoid air cells noted.  Other: None. IMPRESSION: No acute abnormality. Atrophy and chronic microvascular ischemic change. Atherosclerosis. Small right mastoid effusion. Electronically Signed   By: Inge Rise M.D.   On: 01/18/2018 10:42   Dg Chest Portable 1 View  Result Date: 01/18/2018 CLINICAL DATA:  Weakness and left upper extremity numbness EXAM: PORTABLE CHEST 1 VIEW COMPARISON:  November 04, 2017 FINDINGS: No edema or consolidation. Heart size and pulmonary vascularity are normal. No adenopathy. There is aortic atherosclerosis. No evident bone lesion. IMPRESSION: Aortic atherosclerosis.  No edema or consolidation. Aortic Atherosclerosis (ICD10-I70.0). Electronically Signed   By: Lowella Grip III M.D.   On: 01/18/2018 11:34    EKG:   Normal sinus rhythm, left  axis deviation.  IMPRESSION AND PLAN:   1.  Left arm incoordination.  History of prior TIA.  This is concerning for stroke.  Patient takes Plavix at home.  Add aspirin.  She states that she only takes aspirin as needed at home.  Advised she must take this on a daily basis also.  She has an allergy to statin.  She tried Zocor Crestor and Lipitor with muscle aches and pains.  I will try low-dose pravastatin.  Check a lipid profile.  MRI of the brain, carotid ultrasound and echocardiogram and monitor on telemetry.  Case discussed with neurology.  PT and OT consultations. 2.  Hypothyroidism unspecified on levothyroxine.  Check a TSH 3.  Urinary incontinence continue Vesicare 4.  GERD on PPI 5.  Impaired fasting glucose hemoglobin A1c sent off.    All the records are reviewed and case discussed with ED provider. Management plans discussed with the patient, family and they are in agreement.  CODE STATUS: DNR  TOTAL TIME TAKING CARE OF THIS PATIENT: 50 minutes.    Loletha Grayer M.D on 01/18/2018 at 1:14 PM  Between 7am to 6pm - Pager - [phone]  After 6pm call admission pager [phone]  [facility]  [phone]  CC: Primary care physician; Kathrine Haddock, NP

## 2018-01-18 NOTE — ED Notes (Signed)
Pt is A&Ox4 Family at bedside. Pt complains of numbness at the elbow. Pt moves with no drifts.

## 2018-01-18 NOTE — Consult Note (Signed)
Referring Physician: Leslye Peer    Chief Complaint: Left arm weakness  HPI: Sarah Freeman is an 84 y.o. female with a history of TIA on Plavix who reports going to bed last evening at baseline.  Awakened today and noted that she was not able to control her right hand.  Noted some mild weakness and numbness as well.  Called her daughter and presented for evaluation with no improvement in symptims.  Initial NIHSS of 1.  Date last known well: Date: 01/17/2018 Time last known well: Time: 23:00 tPA Given: No: Outside time window  Past Medical History:  Diagnosis Date  . Hypothyroid   . Stroke ([place])   . Urinary incontinence     Past Surgical History:  Procedure Laterality Date  . ABDOMINAL HYSTERECTOMY     age 61  . BREAST BIOPSY Left 2000?   neg. dr. Dwyane Luo office  . CHOLECYSTECTOMY  1964  . COLONOSCOPY     Dr Nicolasa Ducking  . DILATION AND CURETTAGE OF UTERUS    . EYE SURGERY    . SKIN CANCER EXCISION     face    Family History  Problem Relation Age of Onset  . Cancer Father        prostate  . Stroke Father   . Hypertension Sister   . Hyperlipidemia Daughter   . Heart disease Son   . Diabetes Maternal Grandmother   . Heart disease Maternal Grandmother   . Heart disease Maternal Grandfather   . Heart disease Daughter   . CAD Mother   . Failure to thrive Mother   . Breast cancer Neg Hx    Social History:  reports that  has never smoked. she has never used smokeless tobacco. She reports that she does not drink alcohol or use drugs.  Allergies:  Allergies  Allergen Reactions  . Neosporin [Neomycin-Polymyxin-Gramicidin] Other (See Comments)    Pt states wounds do not heal with neosporin  . Statins Other (See Comments)    Leg cramps    Medications:  I have reviewed the patient's current medications. Prior to Admission:  (Not in a hospital admission) Scheduled: .  stroke: mapping our early stages of recovery book   Does not apply Once  . aspirin EC  81 mg Oral Daily   . [START ON 01/19/2018] clopidogrel  75 mg Oral Daily  . [START ON 01/19/2018] darifenacin  7.5 mg Oral Daily  . enoxaparin (LOVENOX) injection  40 mg Subcutaneous Q24H  . levothyroxine  50 mcg Oral QHS  . pantoprazole  40 mg Oral Daily  . pravastatin  10 mg Oral q1800  . psyllium  1 packet Oral QHS  . [START ON 01/19/2018] trimethoprim  100 mg Oral Daily    ROS: History obtained from the patient  General ROS: negative for - chills, fatigue, fever, night sweats, weight gain or weight loss Psychological ROS: negative for - behavioral disorder, hallucinations, memory difficulties, mood swings or suicidal ideation Ophthalmic ROS: negative for - blurry vision, double vision, eye pain or loss of vision ENT ROS: negative for - epistaxis, nasal discharge, oral lesions, sore throat, tinnitus or vertigo Allergy and Immunology ROS: negative for - hives or itchy/watery eyes Hematological and Lymphatic ROS: negative for - bleeding problems, bruising or swollen lymph nodes Endocrine ROS: negative for - galactorrhea, hair pattern changes, polydipsia/polyuria or temperature intolerance Respiratory ROS: negative for - cough, hemoptysis, shortness of breath or wheezing Cardiovascular ROS: negative for - chest pain, dyspnea on exertion, edema or irregular heartbeat  Gastrointestinal ROS: negative for - abdominal pain, diarrhea, hematemesis, nausea/vomiting or stool incontinence Genito-Urinary ROS: negative for - dysuria, hematuria, incontinence or urinary frequency/urgency Musculoskeletal ROS: negative for - joint swelling or muscular weakness Neurological ROS: as noted in HPI Dermatological ROS: negative for rash and skin lesion changes  Physical Examination: Blood pressure 124/78, pulse 70, temperature 98.2 F (36.8 C), temperature source Oral, resp. rate 15, height 5' (1.524 m), weight 84.4 kg (186 lb), SpO2 100 %.  HEENT-  Normocephalic, no lesions, without obvious abnormality.  Normal external eye  and conjunctiva.  Normal TM's bilaterally.  Normal auditory canals and external ears. Normal external nose, mucus membranes and septum.  Normal pharynx. Cardiovascular- S1, S2 normal, pulses palpable throughout   Lungs- chest clear, no wheezing, rales, normal symmetric air entry Abdomen- soft, non-tender; bowel sounds normal; no masses,  no organomegaly Extremities- no edema Lymph-no adenopathy palpable Musculoskeletal-no joint tenderness, deformity or swelling Skin-warm and dry, no hyperpigmentation, vitiligo, or suspicious lesions  Neurological Examination   Mental Status: Alert, oriented, thought content appropriate.  Speech fluent without evidence of aphasia.  Able to follow 3 step commands without difficulty. Cranial Nerves: II: Discs flat bilaterally; Visual fields grossly normal, pupils equal, round, reactive to light and accommodation III,IV, VI: ptosis not present, extra-ocular motions intact bilaterally V,VII: smile symmetric, facial light touch sensation normal bilaterally VIII: hearing normal bilaterally IX,X: gag reflex present XI: bilateral shoulder shrug XII: midline tongue extension Motor: Right : Upper extremity   5/5    Left:     Upper extremity   5-/5  Lower extremity   5/5     Lower extremity   5/5 Tone and bulk:normal tone throughout; no atrophy noted Sensory: Pinprick and light touch intact throughout, bilaterally Deep Tendon Reflexes: 2+ and symmetric with absent AJ's bilaterally Plantars: Right: downgoing   Left: downgoing Cerebellar: Normal finger-to-nose testing on the right, dysmetric on the left.  Decreased rapid alternating movements in the LUE and normal heel-to-shin testing bilaterally Gait: not tested due to safety concerns  Laboratory Studies:  Basic Metabolic Panel: Recent Labs  Lab 01/18/18 1000  NA 137  K 4.2  CL 103  CO2 26  GLUCOSE 116*  BUN 7  CREATININE 1.11*  CALCIUM 8.9    Liver Function Tests: Recent Labs  Lab 01/18/18 1000   AST 20  ALT 16  ALKPHOS 46  BILITOT 0.6  PROT 7.4  ALBUMIN 3.7   No results for input(s): LIPASE, AMYLASE in the last 168 hours. No results for input(s): AMMONIA in the last 168 hours.  CBC: Recent Labs  Lab 01/18/18 1000  WBC 6.7  NEUTROABS 2.9  HGB 13.4  HCT 40.1  MCV 96.3  PLT 344    Cardiac Enzymes: Recent Labs  Lab 01/18/18 1000  TROPONINI <0.03    BNP: Invalid input(s): POCBNP  CBG: Recent Labs  Lab 01/18/18 1022  KDTOIZ 12    Microbiology: Results for orders placed or performed in visit on 01/02/18  Microscopic Examination     Status: Abnormal   Collection Time: 01/02/18  8:39 AM  Result Value Ref Range Status   WBC, UA >30 (H) 0 - 5 /hpf Final   RBC, UA None seen 0 - 2 /hpf Final   Epithelial Cells (non renal) 0-10 0 - 10 /hpf Final   Renal Epithel, UA 0-10 (A) None seen /hpf Final   Mucus, UA Present (A) Not Estab. Final   Bacteria, UA Moderate (A) None seen/Few Final  CULTURE, URINE  COMPREHENSIVE     Status: Abnormal   Collection Time: 01/02/18  9:11 AM  Result Value Ref Range Status   Urine Culture, Comprehensive Final report (A)  Final   Organism ID, Bacteria Escherichia coli (A)  Final    Comment: 50,000-100,000 colony forming units per mL Cefazolin <=4 ug/mL Cefazolin with an MIC <=16 predicts susceptibility to the oral agents cefaclor, cefdinir, cefpodoxime, cefprozil, cefuroxime, cephalexin, and loracarbef when used for therapy of uncomplicated urinary tract infections due to E. coli, Klebsiella pneumoniae, and Proteus mirabilis.    ANTIMICROBIAL SUSCEPTIBILITY Comment  Final    Comment:       ** S = Susceptible; I = Intermediate; R = Resistant **                    P = Positive; N = Negative             MICS are expressed in micrograms per mL    Antibiotic                 RSLT#1    RSLT#2    RSLT#3    RSLT#4 Amoxicillin/Clavulanic Acid    S Ampicillin                     R Cefepime                       S Ceftriaxone                     S Cefuroxime                     S Ciprofloxacin                  S Ertapenem                      S Gentamicin                     S Imipenem                       S Levofloxacin                   S Meropenem                      S Nitrofurantoin                 S Piperacillin/Tazobactam        S Tetracycline                   S Tobramycin                     S Trimethoprim/Sulfa             S     Coagulation Studies: Recent Labs    01/18/18 1000  LABPROT 13.1  INR 1.00    Urinalysis: No results for input(s): COLORURINE, LABSPEC, PHURINE, GLUCOSEU, HGBUR, BILIRUBINUR, KETONESUR, PROTEINUR, UROBILINOGEN, NITRITE, LEUKOCYTESUR in the last 168 hours.  Invalid input(s): APPERANCEUR  Lipid Panel:    Component Value Date/Time   CHOL 198 10/05/2017 0918   CHOL 223 (H) 11/03/2016 0904   TRIG 170 (H) 10/05/2017 0918   TRIG 180 (H) 11/03/2016 0904   HDL 49 10/05/2017 0918   CHOLHDL 4.9  02/15/2016 1436   VLDL 36 (H) 11/03/2016 0904   LDLCALC 115 (H) 10/05/2017 0918    HgbA1C:  Lab Results  Component Value Date   HGBA1C 5.8 02/15/2016    Urine Drug Screen:  No results found for: LABOPIA, COCAINSCRNUR, LABBENZ, AMPHETMU, THCU, LABBARB  Alcohol Level: No results for input(s): ETH in the last 168 hours.  Other results: EKG: sinus rhythm at 74 bpm.  Imaging: Ct Head Wo Contrast  Result Date: 01/18/2018 CLINICAL DATA:  Left arm numbness since waking this morning. EXAM: CT HEAD WITHOUT CONTRAST TECHNIQUE: Contiguous axial images were obtained from the base of the skull through the vertex without intravenous contrast. COMPARISON:  Brain MRI 02/16/2016 and head CT scan 02/15/2016. FINDINGS: Brain: There is atrophy and chronic microvascular ischemic change. No evidence of acute abnormality including hemorrhage, infarct, mass lesion, mass effect, midline shift or abnormal extra-axial fluid collection. No hydrocephalus or pneumocephalus. Vascular: Atherosclerosis noted.  Skull: Intact. Sinuses/Orbits: Small amount of fluid right mastoid air cells noted. Other: None. IMPRESSION: No acute abnormality. Atrophy and chronic microvascular ischemic change. Atherosclerosis. Small right mastoid effusion. Electronically Signed   By: Inge Rise M.D.   On: 01/18/2018 10:42   Dg Chest Portable 1 View  Result Date: 01/18/2018 CLINICAL DATA:  Weakness and left upper extremity numbness EXAM: PORTABLE CHEST 1 VIEW COMPARISON:  November 04, 2017 FINDINGS: No edema or consolidation. Heart size and pulmonary vascularity are normal. No adenopathy. There is aortic atherosclerosis. No evident bone lesion. IMPRESSION: Aortic atherosclerosis.  No edema or consolidation. Aortic Atherosclerosis (ICD10-I70.0). Electronically Signed   By: Lowella Grip III M.D.   On: 01/18/2018 11:34    Assessment: 84 y.o. female with a history of TIA on Plavix who presents with complaints of RUE numbness and weakness.  Numbness has resolved.  Continues to have difficulty using the RUE.  No difficulty with gait.  Head CT reviewed and shows no acute changes.  Suspect acute infarct.  Further work up recommended.     Stroke Risk Factors - hyperlipidemia  Plan: 1. HgbA1c, fasting lipid panel 2. MRI, MRA  of the brain without contrast 3. PT consult, OT consult, Speech consult 4. Echocardiogram 5. Carotid dopplers 6. Prophylactic therapy-ASA 81mg  and Plavix 75mg  daily 7. NPO until RN stroke swallow screen 8. Telemetry monitoring 9. Frequent neuro checks   Alexis Goodell, MD Neurology [phone] 01/18/2018, 1:37 PM

## 2018-01-18 NOTE — ED Notes (Signed)
Dr Bobbye Charleston at bedside.

## 2018-01-18 NOTE — ED Notes (Signed)
Dr. Quentin Cornwall confirmed pt is not a code stroke.

## 2018-01-18 NOTE — ED Provider Notes (Signed)
[facility] Emergency Department Provider Note    None    (approximate)  I have reviewed the triage vital signs and the nursing notes.   HISTORY  Chief Complaint Numbness    HPI Sarah Freeman is a 84 y.o. female previous history of TIA on aspirin and Plavix presents with chief complaint of left upper extremity weakness that started this morning difficulty grabbing things.  States that she cannot feel her face with her left hand.  States that she did have some tingling.  No headaches.  No neck pain.  No chest pain.  States that she was last normal up until 11 PM last night while she was reading before going to bed.  No recent fevers but has been treated for UTI with antibiotics.  No improvement in symptoms.  Past Medical History:  Diagnosis Date  . Hypothyroid   . Stroke [facility])    Family History  Problem Relation Age of Onset  . Cancer Father        prostate  . Stroke Father   . Hypertension Sister   . Hyperlipidemia Daughter   . Heart disease Son   . Diabetes Maternal Grandmother   . Heart disease Maternal Grandmother   . Heart disease Maternal Grandfather   . Heart disease Daughter   . Breast cancer Neg Hx    Past Surgical History:  Procedure Laterality Date  . ABDOMINAL HYSTERECTOMY     age 43  . BREAST BIOPSY Left 2000?   neg. dr. Dwyane Luo office  . CHOLECYSTECTOMY  1964  . COLONOSCOPY     Dr Nicolasa Ducking  . DILATION AND CURETTAGE OF UTERUS    . EYE SURGERY    . SKIN CANCER EXCISION     face   Patient Active Problem List   Diagnosis Date Noted  . UTI (urinary tract infection) 11/04/2017  . History of stroke 06/14/2017  . Statin intolerance 06/14/2017  . Counseling regarding advanced directives and goals of care 02/22/2017  . Leg pain 11/03/2016  . Constipation 09/01/2016  . 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 Osteopenia 01/29/2016  . Menopausal state 01/29/2016  . Hypothyroidism 01/29/2016  . GERD (gastroesophageal reflux  disease) 01/29/2016  . Hypercholesterolemia 01/29/2016  . Lumbar spinal stenosis 01/29/2016  . Stress incontinence 01/29/2016  . Breast microcalcification, mammographic 06/17/2015      Prior to Admission medications   Medication Sig Start Date End Date Taking? Authorizing Provider  clopidogrel (PLAVIX) 75 MG tablet Take 1 tablet (75 mg total) by mouth daily. 12/23/17  Yes Volney American, PA-C  esomeprazole (NEXIUM) 20 MG capsule TAKE 1 CAPSULE DAILY AT 12 NOON 12/23/17  Yes Volney American, PA-C  levothyroxine (SYNTHROID, LEVOTHROID) 50 MCG tablet Take 1 tablet (50 mcg total) by mouth at bedtime. 12/23/17  Yes Volney American, PA-C  PSYLLIUM PO Take 6 tablets by mouth at bedtime. 100%    Yes [provider]  Red Yeast Rice Extract (RED YEAST RICE PO) Take 1 tablet by mouth daily.   Yes [provider]  solifenacin (VESICARE) 5 MG tablet Take 1 tablet (5 mg total) by mouth daily. 11/14/17  Yes Festus Aloe, MD  trimethoprim (TRIMPEX) 100 MG tablet Take 1 tablet (100 mg total) by mouth daily. 01/02/18  Yes MacDiarmid, Nicki Reaper, MD  acetaminophen (TYLENOL ARTHRITIS PAIN) 650 MG CR tablet Take 650 mg by mouth every 8 (eight) hours as needed for pain.    [provider]  diphenhydramine-acetaminophen (TYLENOL PM) 25-500  MG TABS tablet Take 1 tablet by mouth at bedtime as needed.    [provider]  nitrofurantoin (MACRODANTIN) 100 MG capsule Take 1 capsule (100 mg total) by mouth 4 (four) times daily. 01/11/18   Bjorn Loser, MD    Allergies Neosporin [neomycin-polymyxin-gramicidin] and Statins    Social History Social History   Tobacco Use  . Smoking status: Never Smoker  . Smokeless tobacco: Never Used  Substance Use Topics  . Alcohol use: No    Alcohol/week: 0.0 oz  . Drug use: No    Review of Systems Patient denies headaches, rhinorrhea, blurry vision, numbness, shortness of breath, chest pain, edema, cough, abdominal  pain, nausea, vomiting, diarrhea, dysuria, fevers, rashes or hallucinations unless otherwise stated above in HPI. ____________________________________________   PHYSICAL EXAM:  VITAL SIGNS: Vitals:   01/18/18 0953  BP: 130/66  Pulse: 78  Resp: 18  Temp: 98.2 F (36.8 C)  SpO2: 98%    Constitutional: Alert and oriented. Well appearing and in no acute distress. Eyes: Conjunctivae are normal.  Head: Atraumatic. Nose: No congestion/rhinnorhea. Mouth/Throat: Mucous membranes are moist.   Neck: No stridor. Painless ROM.  Cardiovascular: Normal rate, regular rhythm. Grossly normal heart sounds.  Good peripheral circulation. Respiratory: Normal respiratory effort.  No retractions. Lungs CTAB. Gastrointestinal: Soft and nontender. No distention. No abdominal bruits. No CVA tenderness. Genitourinary:  Musculoskeletal: No lower extremity tenderness nor edema.  No joint effusions. Neurologic:  Normal speech and language. CN 2-12 intact. Normal FNF of right UE,  Significant dysmetria of LUE.  no LE dysmetria or ataxia No facial droop Skin:  Skin is warm, dry and intact. No rash noted. Psychiatric: Mood and affect are normal. Speech and behavior are normal.  ____________________________________________   LABS (all labs ordered are listed, but only abnormal results are displayed)  Results for orders placed or performed during the hospital encounter of 01/18/18 (from the past 24 hour(s))  Protime-INR     Status: None   Collection Time: 01/18/18 10:00 AM  Result Value Ref Range   Prothrombin Time 13.1 11.4 - 15.2 seconds   INR 1.00   APTT     Status: None   Collection Time: 01/18/18 10:00 AM  Result Value Ref Range   aPTT 27 24 - 36 seconds  CBC     Status: None   Collection Time: 01/18/18 10:00 AM  Result Value Ref Range   WBC 6.7 3.6 - 11.0 K/uL   RBC 4.16 3.80 - 5.20 MIL/uL   Hemoglobin 13.4 12.0 - 16.0 g/dL   HCT 40.1 35.0 - 47.0 %   MCV 96.3 80.0 - 100.0 fL   MCH 32.2 26.0  - 34.0 pg   MCHC 33.4 32.0 - 36.0 g/dL   RDW 13.3 11.5 - 14.5 %   Platelets 344 150 - 440 K/uL  Differential     Status: None   Collection Time: 01/18/18 10:00 AM  Result Value Ref Range   Neutrophils Relative % 42 %   Neutro Abs 2.9 1.4 - 6.5 K/uL   Lymphocytes Relative 39 %   Lymphs Abs 2.6 1.0 - 3.6 K/uL   Monocytes Relative 11 %   Monocytes Absolute 0.7 0.2 - 0.9 K/uL   Eosinophils Relative 6 %   Eosinophils Absolute 0.4 0 - 0.7 K/uL   Basophils Relative 2 %   Basophils Absolute 0.1 0 - 0.1 K/uL  Comprehensive metabolic panel     Status: Abnormal   Collection Time: 01/18/18 10:00 AM  Result Value  Ref Range   Sodium 137 135 - 145 mmol/L   Potassium 4.2 3.5 - 5.1 mmol/L   Chloride 103 101 - 111 mmol/L   CO2 26 22 - 32 mmol/L   Glucose, Bld 116 (H) 65 - 99 mg/dL   BUN 7 6 - 20 mg/dL   Creatinine, Ser 1.11 (H) 0.44 - 1.00 mg/dL   Calcium 8.9 8.9 - 10.3 mg/dL   Total Protein 7.4 6.5 - 8.1 g/dL   Albumin 3.7 3.5 - 5.0 g/dL   AST 20 15 - 41 U/L   ALT 16 14 - 54 U/L   Alkaline Phosphatase 46 38 - 126 U/L   Total Bilirubin 0.6 0.3 - 1.2 mg/dL   GFR calc non Af Amer 44 (L) >60 mL/min   GFR calc Af Amer 51 (L) >60 mL/min   Anion gap 8 5 - 15  Troponin I     Status: None   Collection Time: 01/18/18 10:00 AM  Result Value Ref Range   Troponin I <0.03 <0.03 ng/mL  Glucose, capillary     Status: None   Collection Time: 01/18/18 10:22 AM  Result Value Ref Range   Glucose-Capillary 97 65 - 99 mg/dL   ____________________________________________  EKG My review and personal interpretation at Time: 10:10   Indication: weakness  Rate: 80  Rhythm: sinus Axis: normal Other: normal intervals, no stemi,  ____________________________________________  RADIOLOGY  I personally reviewed all radiographic images ordered to evaluate for the above acute complaints and reviewed radiology reports and findings.  These findings were personally discussed with the patient.  Please see medical  record for radiology report.  ____________________________________________   PROCEDURES  Procedure(s) performed:  Procedures    Critical Care performed: no ____________________________________________   INITIAL IMPRESSION / ASSESSMENT AND PLAN / ED COURSE  Pertinent labs & imaging results that were available during my care of the patient were reviewed by me and considered in my medical decision making (see chart for details).  DDX: cva, tia, hypoglycemia, dehydration, electrolyte abnormality, dissection, sepsis   Sarah Freeman is a 84 y.o. who presents to the ED with symptoms as described above.  Patient does have acute focal neuro deficit and given her symptoms breaking through aspirin and Plavix CT imaging ordered.  No evidence of bleed.  She is out of the window for TPA.  Blood work is otherwise reassuring.  Does not appear clinically consistent with a metabolic process.  Based on symptoms and new neuro deficits do feel patient will require admission to the hospital for further medical management.  Have discussed with the patient and available family all diagnostics and treatments performed thus far and all questions were answered to the best of my ability. The patient demonstrates understanding and agreement with plan.       ____________________________________________   FINAL CLINICAL IMPRESSION(S) / ED DIAGNOSES  Final diagnoses:  Dysmetria  LUE weakness      NEW MEDICATIONS STARTED DURING THIS VISIT:  New Prescriptions   No medications on file     Note:  This document was prepared using Dragon voice recognition software and may include unintentional dictation errors.    Merlyn Lot, MD 01/18/18 1420

## 2018-01-19 DIAGNOSIS — I639 Cerebral infarction, unspecified: Secondary | ICD-10-CM | POA: Diagnosis not present

## 2018-01-19 LAB — CBC
HCT: 37.9 % (ref 35.0–47.0)
Hemoglobin: 12.8 g/dL (ref 12.0–16.0)
MCH: 32.4 pg (ref 26.0–34.0)
MCHC: 33.9 g/dL (ref 32.0–36.0)
MCV: 95.6 fL (ref 80.0–100.0)
PLATELETS: 285 10*3/uL (ref 150–440)
RBC: 3.96 MIL/uL (ref 3.80–5.20)
RDW: 13.3 % (ref 11.5–14.5)
WBC: 7.5 10*3/uL (ref 3.6–11.0)

## 2018-01-19 LAB — ECHOCARDIOGRAM COMPLETE
Height: 60 in
Weight: 3591 oz

## 2018-01-19 LAB — LIPID PANEL
CHOL/HDL RATIO: 4 ratio
Cholesterol: 170 mg/dL (ref 0–200)
HDL: 43 mg/dL (ref >40)
LDL Cholesterol: 88 mg/dL (ref 0–99)
Triglycerides: 196 mg/dL — ABNORMAL HIGH (ref <150)
VLDL: 39 mg/dL (ref 0–40)

## 2018-01-19 LAB — BASIC METABOLIC PANEL
ANION GAP: 10 (ref 5–15)
BUN: 11 mg/dL (ref 6–20)
CALCIUM: 8.6 mg/dL — AB (ref 8.9–10.3)
CO2: 23 mmol/L (ref 22–32)
CREATININE: 0.92 mg/dL (ref 0.44–1.00)
Chloride: 105 mmol/L (ref 101–111)
GFR calc non Af Amer: 56 mL/min — ABNORMAL LOW (ref >60)
Glucose, Bld: 102 mg/dL — ABNORMAL HIGH (ref 65–99)
Potassium: 4.1 mmol/L (ref 3.5–5.1)
SODIUM: 138 mmol/L (ref 135–145)

## 2018-01-19 MED ORDER — ASPIRIN 81 MG PO TBEC: 81.0000 mg | DELAYED_RELEASE_TABLET | Freq: Every day | ORAL | 0 refills | Status: DC

## 2018-01-19 MED ORDER — PRAVASTATIN SODIUM 10 MG PO TABS: 10.0000 mg | ORAL_TABLET | Freq: Every day | ORAL | 0 refills | Status: DC

## 2018-01-19 NOTE — Progress Notes (Signed)
Subjective: No new neurological complaints  Objective: Current vital signs: BP (!) 121/59 (BP Location: Left Arm)   Pulse 63   Temp 98.1 F (36.7 C) (Oral)   Resp 18   Ht 5' (1.524 m)   Wt 101.8 kg (224 lb 7 oz)   SpO2 98%   BMI 43.83 kg/m  Vital signs in last 24 hours: Temp:  [97.6 F (36.4 C)-98.6 F (37 C)] 98.1 F (36.7 C) (02/21 0739) Pulse Rate:  [63-80] 63 (02/21 0739) Resp:  [15-30] 18 (02/21 0739) BP: (99-148)/(38-86) 121/59 (02/21 0739) SpO2:  [94 %-100 %] 98 % (02/21 0739) Weight:  [101.8 kg (224 lb 7 oz)] 101.8 kg (224 lb 7 oz) (02/20 1352)  Intake/Output from previous day: 02/20 0701 - 02/21 0700 In: 240 [P.O.:240] Out: -  Intake/Output this shift: Total I/O In: 600 [P.O.:600] Out: -  Nutritional status: Fall precautions Diet Heart Room service appropriate? Yes; Fluid consistency: Thin  Neurologic Exam: Mental Status: Alert, oriented, thought content appropriate.  Speech fluent without evidence of aphasia.  Able to follow 3 step commands without difficulty. Cranial Nerves: II: Discs flat bilaterally; Visual fields grossly normal, pupils equal, round, reactive to light and accommodation III,IV, VI: ptosis not present, extra-ocular motions intact bilaterally V,VII: smile symmetric, facial light touch sensation normal bilaterally VIII: hearing normal bilaterally IX,X: gag reflex present XI: bilateral shoulder shrug XII: midline tongue extension Motor: Right :  Upper extremity   5/5                                      Left:     Upper extremity   5-/5             Lower extremity   5/5                                                  Lower extremity   5/5 Cerebellar: Dysmetria on finger-to-nose testing on the left    Lab Results: Basic Metabolic Panel: Recent Labs  Lab 01/18/18 1000 01/19/18 0406  NA 137 138  K 4.2 4.1  CL 103 105  CO2 26 23  GLUCOSE 116* 102*  BUN 7 11  CREATININE 1.11* 0.92  CALCIUM 8.9 8.6*    Liver Function  Tests: Recent Labs  Lab 01/18/18 1000  AST 20  ALT 16  ALKPHOS 46  BILITOT 0.6  PROT 7.4  ALBUMIN 3.7   No results for input(s): LIPASE, AMYLASE in the last 168 hours. No results for input(s): AMMONIA in the last 168 hours.  CBC: Recent Labs  Lab 01/18/18 1000 01/19/18 0406  WBC 6.7 7.5  NEUTROABS 2.9  --   HGB 13.4 12.8  HCT 40.1 37.9  MCV 96.3 95.6  PLT 344 285    Cardiac Enzymes: Recent Labs  Lab 01/18/18 1000  TROPONINI <0.03    Lipid Panel: Recent Labs  Lab 01/19/18 0406  CHOL 170  TRIG 196*  HDL 43  CHOLHDL 4.0  VLDL 39  LDLCALC 88    CBG: Recent Labs  Lab 01/18/18 1022  [place]    Microbiology: Results for orders placed or performed in visit on 01/02/18  Microscopic Examination     Status: Abnormal   Collection Time: 01/02/18  8:39 AM  Result Value Ref Range Status   WBC, UA >30 (H) 0 - 5 /hpf Final   RBC, UA None seen 0 - 2 /hpf Final   Epithelial Cells (non renal) 0-10 0 - 10 /hpf Final   Renal Epithel, UA 0-10 (A) None seen /hpf Final   Mucus, UA Present (A) Not Estab. Final   Bacteria, UA Moderate (A) None seen/Few Final  CULTURE, URINE COMPREHENSIVE     Status: Abnormal   Collection Time: 01/02/18  9:11 AM  Result Value Ref Range Status   Urine Culture, Comprehensive Final report (A)  Final   Organism ID, Bacteria Escherichia coli (A)  Final    Comment: 50,000-100,000 colony forming units per mL Cefazolin <=4 ug/mL Cefazolin with an MIC <=16 predicts susceptibility to the oral agents cefaclor, cefdinir, cefpodoxime, cefprozil, cefuroxime, cephalexin, and loracarbef when used for therapy of uncomplicated urinary tract infections due to E. coli, Klebsiella pneumoniae, and Proteus mirabilis.    ANTIMICROBIAL SUSCEPTIBILITY Comment  Final    Comment:       ** S = Susceptible; I = Intermediate; R = Resistant **                    P = Positive; N = Negative             MICS are expressed in micrograms per mL    Antibiotic                  RSLT#1    RSLT#2    RSLT#3    RSLT#4 Amoxicillin/Clavulanic Acid    S Ampicillin                     R Cefepime                       S Ceftriaxone                    S Cefuroxime                     S Ciprofloxacin                  S Ertapenem                      S Gentamicin                     S Imipenem                       S Levofloxacin                   S Meropenem                      S Nitrofurantoin                 S Piperacillin/Tazobactam        S Tetracycline                   S Tobramycin                     S Trimethoprim/Sulfa             S     Coagulation Studies: Recent Labs    01/18/18 1000  LABPROT 13.1  INR 1.00  Imaging: Ct Head Wo Contrast  Result Date: 01/18/2018 CLINICAL DATA:  Left arm numbness since waking this morning. EXAM: CT HEAD WITHOUT CONTRAST TECHNIQUE: Contiguous axial images were obtained from the base of the skull through the vertex without intravenous contrast. COMPARISON:  Brain MRI 02/16/2016 and head CT scan 02/15/2016. FINDINGS: Brain: There is atrophy and chronic microvascular ischemic change. No evidence of acute abnormality including hemorrhage, infarct, mass lesion, mass effect, midline shift or abnormal extra-axial fluid collection. No hydrocephalus or pneumocephalus. Vascular: Atherosclerosis noted. Skull: Intact. Sinuses/Orbits: Small amount of fluid right mastoid air cells noted. Other: None. IMPRESSION: No acute abnormality. Atrophy and chronic microvascular ischemic change. Atherosclerosis. Small right mastoid effusion. Electronically Signed   By: Inge Rise M.D.   On: 01/18/2018 10:42   Mr Brain Wo Contrast  Result Date: 01/18/2018 CLINICAL DATA:  Watershed infarction 2 years ago. Acute presentation with left arm weakness and numbness beginning today. EXAM: MRI HEAD WITHOUT CONTRAST TECHNIQUE: Multiplanar, multiecho pulse sequences of the brain and surrounding structures were obtained without intravenous  contrast. COMPARISON:  CT same day.  MRI 02/16/2016. FINDINGS: Brain: Diffusion imaging shows a few new foci of acute infarction in the right parietal deep white matter and cortex, possibly the pre central gyrus. No large confluent infarction is present. The brainstem and cerebellum are normal. Cerebral hemispheres otherwise show generalized atrophy with chronic small-vessel ischemic changes of the deep white matter an old watershed distribution infarction within the right hemispheric white matter and right frontal cortical and subcortical brain. No evidence of mass lesion, hemorrhage, hydrocephalus or extra-axial collection. Vascular: Major vessels at the base of the brain show flow. Skull and upper cervical spine: Negative Sinuses/Orbits: Sinuses are clear. Orbits negative. Mastoid effusion on the right. Other: None IMPRESSION: Background pattern of chronic small vessel ischemic changes of the cerebral hemispheric white matter an old watershed infarction within the right hemispheric white matter and right frontal cortical and subcortical brain. Few small areas of acute infarction are present in the right parietal deep white matter and a single right parietal gyrus, possibly the precentral gyrus. No swelling or hemorrhage. Electronically Signed   By: Nelson Chimes M.D.   On: 01/18/2018 15:34   US Carotid Bilateral (at [facility] Only)  Result Date: 01/19/2018 CLINICAL DATA:  Left arm weakness EXAM: BILATERAL CAROTID DUPLEX ULTRASOUND TECHNIQUE: Pearline Cables scale imaging, color Doppler and duplex ultrasound were performed of bilateral carotid and vertebral arteries in the neck. COMPARISON:  None. FINDINGS: Criteria: Quantification of carotid stenosis is based on velocity parameters that correlate the residual internal carotid diameter with NASCET-based stenosis levels, using the diameter of the distal internal carotid lumen as the denominator for stenosis measurement. The following velocity measurements were obtained:  RIGHT ICA:  106/16 cm/sec CCA:  47/42 cm/sec SYSTOLIC ICA/CCA RATIO:  1.1 DIASTOLIC ICA/CCA RATIO:  1.5 ECA:  83 cm/sec LEFT ICA:  182/26 cm/sec CCA:  595/63 cm/sec SYSTOLIC ICA/CCA RATIO:  1.1 DIASTOLIC ICA/CCA RATIO:  1.7 ECA:  82 cm/sec RIGHT CAROTID ARTERY: There is a minimal to moderate amount of atherosclerotic plaque within the right carotid bulb (image 15), not resulting in elevated peak systolic velocities within the interrogated course the right internal carotid artery to suggest a hemodynamically significant stenosis. RIGHT VERTEBRAL ARTERY:  Antegrade Flow LEFT CAROTID ARTERY: There is a minimal amount of atherosclerotic plaque within the left carotid bulb (image 47), not resulting in elevated peak systolic velocities within the interrogated course the left internal carotid artery to suggest a hemodynamically  significant stenosis. LEFT VERTEBRAL ARTERY:  Antegrade flow IMPRESSION: Minimal to moderate amount of bilateral atherosclerotic plaque, right greater than left, not resulting in a hemodynamically significant stenosis within either internal carotid artery. Electronically Signed   By: Sandi Mariscal M.D.   On: 01/19/2018 08:10   Dg Chest Portable 1 View  Result Date: 01/18/2018 CLINICAL DATA:  Weakness and left upper extremity numbness EXAM: PORTABLE CHEST 1 VIEW COMPARISON:  November 04, 2017 FINDINGS: No edema or consolidation. Heart size and pulmonary vascularity are normal. No adenopathy. There is aortic atherosclerosis. No evident bone lesion. IMPRESSION: Aortic atherosclerosis.  No edema or consolidation. Aortic Atherosclerosis (ICD10-I70.0). Electronically Signed   By: Lowella Grip III M.D.   On: 01/18/2018 11:34    Medications:  I have reviewed the patient's current medications. Scheduled: . aspirin EC  81 mg Oral Daily  . clopidogrel  75 mg Oral Daily  . darifenacin  7.5 mg Oral Daily  . enoxaparin (LOVENOX) injection  40 mg Subcutaneous Q24H  . levothyroxine  50 mcg Oral QAC  breakfast  . pantoprazole  40 mg Oral Daily  . pravastatin  10 mg Oral q1800  . psyllium  1 packet Oral QHS  . trimethoprim  100 mg Oral Daily    Assessment/Plan: 84 year old with acute symptoms of inability to control LUE.  Symptoms persist.  MRI of the brain reviewed and shows a few small acute infarcts in the right parietal deep white matter.  Concern is for embolic etiology.  Carotid dopplers show no evidence of hemodynamically significant stenosis.  Echocardiogram shows no cardiac source of emboli with an EF of 60-65%.  A1c 6.1, LDL 88.  Recommendations: 1. Statin for lipid management with target LDL<70. 2. Continue ASA and Plavix 3. Patient to see cardiology on an outpatient basis for evaluation for a prolonged cardiac monitor 4. Follow up with neurology on an outpatient basis  If further questions arise, please call or page at that time.  Thank you for allowing neurology to participate in the care of this patient.  Alexis Goodell, MD Neurology [phone] 01/19/2018  11:04 AM   LOS: 0 days

## 2018-01-19 NOTE — Progress Notes (Signed)
OT Cancellation Note  Patient Details Name: Sarah Freeman MRN: [id number] DOB: 09-09-1931   Cancelled Treatment:    Reason Eval/Treat Not Completed: Patient at procedure or test/ unavailable. Order received, chart reviewed. Pt with MD to assess. Will re-attempt later this morning as pt is available for OT evaluation.   Jeni Salles, MPH, MS, OTR/L ascom [phone] 01/19/18, 10:31 AM

## 2018-01-19 NOTE — Discharge Summary (Signed)
[facility] [place] NAME: Sarah Freeman    MR#:  [id number]  DATE OF BIRTH:  Aug 17, 1931  DATE OF ADMISSION:  01/18/2018 ADMITTING PHYSICIAN: Loletha Grayer, MD  DATE OF DISCHARGE: No discharge date for patient encounter.  PRIMARY CARE PHYSICIAN: Kathrine Haddock, NP    ADMISSION DIAGNOSIS:  Dysmetria [R27.8] Left arm weakness [R29.898] LUE weakness [R29.898]  DISCHARGE DIAGNOSIS:  Active Problems:   Left arm weakness   SECONDARY DIAGNOSIS:   Past Medical History:  Diagnosis Date  . Hypothyroid   . Stroke ([place])   . Urinary incontinence     HOSPITAL COURSE:  1 acute right cerebrovascular accident MRI noted for right parietal infarct Patient was admitted on our CVA protocol, carotid Dopplers were negative for any hemodynamically significant stenosis, echocardiogram noted for normal ejection fraction with stage I diastolic dysfunction, hemoglobin A1c was normal, evaluated by speech/physical therapy-no needs identified, patient will be discharged on aspirin/Plavix, pravastatin-allergy to statin therapy was leg cramping, in discussion with neurology patient to follow-up with cardiology status post discharge for prolonged heart monitoring for further evaluation  DISCHARGE CONDITIONS:  On day of discharge patient is afebrile, hemodynamically stable, tolerating a diet, ready for discharge home with family, appropriate follow-up with neurology, cardiology, and primary care as directed, for more specific details please see chart  CONSULTS OBTAINED:  Treatment Team:  Catarina Hartshorn, MD Alexis Goodell, MD  DRUG ALLERGIES:   Allergies  Allergen Reactions  . Neosporin [Neomycin-Polymyxin-Gramicidin] Other (See Comments)    Pt states wounds do not heal with neosporin  . Statins Other (See Comments)    Leg cramps    DISCHARGE MEDICATIONS:   Allergies as of 01/19/2018      Reactions   Neosporin  [neomycin-polymyxin-gramicidin] Other (See Comments)   Pt states wounds do not heal with neosporin   Statins Other (See Comments)   Leg cramps      Medication List    TAKE these medications   aspirin 81 MG EC tablet Take 1 tablet (81 mg total) by mouth daily. Start taking on:  01/20/2018   clopidogrel 75 MG tablet Commonly known as:  PLAVIX Take 1 tablet (75 mg total) by mouth daily.   diphenhydramine-acetaminophen 25-500 MG Tabs tablet Commonly known as:  TYLENOL PM Take 1 tablet by mouth at bedtime as needed.   esomeprazole 20 MG capsule Commonly known as:  NEXIUM TAKE 1 CAPSULE DAILY AT 12 NOON   levothyroxine 50 MCG tablet Commonly known as:  SYNTHROID, LEVOTHROID Take 1 tablet (50 mcg total) by mouth at bedtime.   nitrofurantoin 100 MG capsule Commonly known as:  MACRODANTIN Take 1 capsule (100 mg total) by mouth 4 (four) times daily.   pravastatin 10 MG tablet Commonly known as:  PRAVACHOL Take 1 tablet (10 mg total) by mouth daily at 6 PM.   PSYLLIUM PO Take 6 tablets by mouth at bedtime. 100%   RED YEAST RICE PO Take 1 tablet by mouth daily.   solifenacin 5 MG tablet Commonly known as:  VESICARE Take 1 tablet (5 mg total) by mouth daily.   trimethoprim 100 MG tablet Commonly known as:  TRIMPEX Take 1 tablet (100 mg total) by mouth daily.   TYLENOL ARTHRITIS PAIN 650 MG CR tablet Generic drug:  acetaminophen Take 650 mg by mouth every 8 (eight) hours as needed for pain.        DISCHARGE INSTRUCTIONS:     If you experience worsening of your admission  symptoms, develop shortness of breath, life threatening emergency, suicidal or homicidal thoughts you must seek medical attention immediately by calling 911 or calling your MD immediately  if symptoms less severe.  You Must read complete instructions/literature along with all the possible adverse reactions/side effects for all the Medicines you take and that have been prescribed to you. Take any new  Medicines after you have completely understood and accept all the possible adverse reactions/side effects.   Please note  You were cared for by a hospitalist during your hospital stay. If you have any questions about your discharge medications or the care you received while you were in the hospital after you are discharged, you can call the unit and asked to speak with the hospitalist on call if the hospitalist that took care of you is not available. Once you are discharged, your primary care physician will handle any further medical issues. Please note that NO REFILLS for any discharge medications will be authorized once you are discharged, as it is imperative that you return to your primary care physician (or establish a relationship with a primary care physician if you do not have one) for your aftercare needs so that they can reassess your need for medications and monitor your lab values. Today   CHIEF COMPLAINT:   Chief Complaint  Patient presents with  . Numbness    HISTORY OF PRESENT ILLNESS:  84 y.o. female with a known history of watershed infarct 2 years ago.  She presents with left arm incoordination and a numbness feeling in her forearm from when she woke up this morning.  She was trying to move around and was able to move her arm but not how she would like.  The numbness feeling has gone away by now.  She felt well prior to going to bed.  In the ER a CT scan of the head was negative.  She still had incoordination of her left arm. VITAL SIGNS:  Blood pressure (!) 121/59, pulse 63, temperature 98.1 F (36.7 C), temperature source Oral, resp. rate 18, height 5' (1.524 m), weight 101.8 kg (224 lb 7 oz), SpO2 98 %.  I/O:    Intake/Output Summary (Last 24 hours) at 01/19/2018 1123 Last data filed at 01/19/2018 0900 Gross per 24 hour  Intake 840 ml  Output -  Net 840 ml    PHYSICAL EXAMINATION:  GENERAL:  84 y.o.-year-old patient lying in the bed with no acute distress.  EYES:  Pupils equal, round, reactive to light and accommodation. No scleral icterus. Extraocular muscles intact.  HEENT: Head atraumatic, normocephalic. Oropharynx and nasopharynx clear.  NECK:  Supple, no jugular venous distention. No thyroid enlargement, no tenderness.  LUNGS: Normal breath sounds bilaterally, no wheezing, rales,rhonchi or crepitation. No use of accessory muscles of respiration.  CARDIOVASCULAR: S1, S2 normal. No murmurs, rubs, or gallops.  ABDOMEN: Soft, non-tender, non-distended. Bowel sounds present. No organomegaly or mass.  EXTREMITIES: No pedal edema, cyanosis, or clubbing.  NEUROLOGIC: Cranial nerves II through XII are intact. Muscle strength 5/5 in all extremities. Sensation intact. Gait not checked.  PSYCHIATRIC: The patient is alert and oriented x 3.  SKIN: No obvious rash, lesion, or ulcer.   DATA REVIEW:   CBC Recent Labs  Lab 01/19/18 0406  WBC 7.5  HGB 12.8  HCT 37.9  PLT 285    Chemistries  Recent Labs  Lab 01/18/18 1000 01/19/18 0406  NA 137 138  K 4.2 4.1  CL 103 105  CO2 26 23  GLUCOSE 116* 102*  BUN 7 11  CREATININE 1.11* 0.92  CALCIUM 8.9 8.6*  AST 20  --   ALT 16  --   ALKPHOS 46  --   BILITOT 0.6  --     Cardiac Enzymes Recent Labs  Lab 01/18/18 1000  TROPONINI <0.03    Microbiology Results  Results for orders placed or performed in visit on 01/02/18  Microscopic Examination     Status: Abnormal   Collection Time: 01/02/18  8:39 AM  Result Value Ref Range Status   WBC, UA >30 (H) 0 - 5 /hpf Final   RBC, UA None seen 0 - 2 /hpf Final   Epithelial Cells (non renal) 0-10 0 - 10 /hpf Final   Renal Epithel, UA 0-10 (A) None seen /hpf Final   Mucus, UA Present (A) Not Estab. Final   Bacteria, UA Moderate (A) None seen/Few Final  CULTURE, URINE COMPREHENSIVE     Status: Abnormal   Collection Time: 01/02/18  9:11 AM  Result Value Ref Range Status   Urine Culture, Comprehensive Final report (A)  Final   Organism ID, Bacteria  Escherichia coli (A)  Final    Comment: 50,000-100,000 colony forming units per mL Cefazolin <=4 ug/mL Cefazolin with an MIC <=16 predicts susceptibility to the oral agents cefaclor, cefdinir, cefpodoxime, cefprozil, cefuroxime, cephalexin, and loracarbef when used for therapy of uncomplicated urinary tract infections due to E. coli, Klebsiella pneumoniae, and Proteus mirabilis.    ANTIMICROBIAL SUSCEPTIBILITY Comment  Final    Comment:       ** S = Susceptible; I = Intermediate; R = Resistant **                    P = Positive; N = Negative             MICS are expressed in micrograms per mL    Antibiotic                 RSLT#1    RSLT#2    RSLT#3    RSLT#4 Amoxicillin/Clavulanic Acid    S Ampicillin                     R Cefepime                       S Ceftriaxone                    S Cefuroxime                     S Ciprofloxacin                  S Ertapenem                      S Gentamicin                     S Imipenem                       S Levofloxacin                   S Meropenem                      S Nitrofurantoin                 S  Piperacillin/Tazobactam        S Tetracycline                   S Tobramycin                     S Trimethoprim/Sulfa             S     RADIOLOGY:  Ct Head Wo Contrast  Result Date: 01/18/2018 CLINICAL DATA:  Left arm numbness since waking this morning. EXAM: CT HEAD WITHOUT CONTRAST TECHNIQUE: Contiguous axial images were obtained from the base of the skull through the vertex without intravenous contrast. COMPARISON:  Brain MRI 02/16/2016 and head CT scan 02/15/2016. FINDINGS: Brain: There is atrophy and chronic microvascular ischemic change. No evidence of acute abnormality including hemorrhage, infarct, mass lesion, mass effect, midline shift or abnormal extra-axial fluid collection. No hydrocephalus or pneumocephalus. Vascular: Atherosclerosis noted. Skull: Intact. Sinuses/Orbits: Small amount of fluid right mastoid air cells noted.  Other: None. IMPRESSION: No acute abnormality. Atrophy and chronic microvascular ischemic change. Atherosclerosis. Small right mastoid effusion. Electronically Signed   By: Inge Rise M.D.   On: 01/18/2018 10:42   Mr Brain Wo Contrast  Result Date: 01/18/2018 CLINICAL DATA:  Watershed infarction 2 years ago. Acute presentation with left arm weakness and numbness beginning today. EXAM: MRI HEAD WITHOUT CONTRAST TECHNIQUE: Multiplanar, multiecho pulse sequences of the brain and surrounding structures were obtained without intravenous contrast. COMPARISON:  CT same day.  MRI 02/16/2016. FINDINGS: Brain: Diffusion imaging shows a few new foci of acute infarction in the right parietal deep white matter and cortex, possibly the pre central gyrus. No large confluent infarction is present. The brainstem and cerebellum are normal. Cerebral hemispheres otherwise show generalized atrophy with chronic small-vessel ischemic changes of the deep white matter an old watershed distribution infarction within the right hemispheric white matter and right frontal cortical and subcortical brain. No evidence of mass lesion, hemorrhage, hydrocephalus or extra-axial collection. Vascular: Major vessels at the base of the brain show flow. Skull and upper cervical spine: Negative Sinuses/Orbits: Sinuses are clear. Orbits negative. Mastoid effusion on the right. Other: None IMPRESSION: Background pattern of chronic small vessel ischemic changes of the cerebral hemispheric white matter an old watershed infarction within the right hemispheric white matter and right frontal cortical and subcortical brain. Few small areas of acute infarction are present in the right parietal deep white matter and a single right parietal gyrus, possibly the precentral gyrus. No swelling or hemorrhage. Electronically Signed   By: Nelson Chimes M.D.   On: 01/18/2018 15:34   US Carotid Bilateral (at [facility] Only)  Result Date: 01/19/2018 CLINICAL DATA:   Left arm weakness EXAM: BILATERAL CAROTID DUPLEX ULTRASOUND TECHNIQUE: Pearline Cables scale imaging, color Doppler and duplex ultrasound were performed of bilateral carotid and vertebral arteries in the neck. COMPARISON:  None. FINDINGS: Criteria: Quantification of carotid stenosis is based on velocity parameters that correlate the residual internal carotid diameter with NASCET-based stenosis levels, using the diameter of the distal internal carotid lumen as the denominator for stenosis measurement. The following velocity measurements were obtained: RIGHT ICA:  106/16 cm/sec CCA:  26/20 cm/sec SYSTOLIC ICA/CCA RATIO:  1.1 DIASTOLIC ICA/CCA RATIO:  1.5 ECA:  83 cm/sec LEFT ICA:  182/26 cm/sec CCA:  355/97 cm/sec SYSTOLIC ICA/CCA RATIO:  1.1 DIASTOLIC ICA/CCA RATIO:  1.7 ECA:  82 cm/sec RIGHT CAROTID ARTERY: There is a minimal to moderate amount of atherosclerotic plaque within the right  carotid bulb (image 15), not resulting in elevated peak systolic velocities within the interrogated course the right internal carotid artery to suggest a hemodynamically significant stenosis. RIGHT VERTEBRAL ARTERY:  Antegrade Flow LEFT CAROTID ARTERY: There is a minimal amount of atherosclerotic plaque within the left carotid bulb (image 47), not resulting in elevated peak systolic velocities within the interrogated course the left internal carotid artery to suggest a hemodynamically significant stenosis. LEFT VERTEBRAL ARTERY:  Antegrade flow IMPRESSION: Minimal to moderate amount of bilateral atherosclerotic plaque, right greater than left, not resulting in a hemodynamically significant stenosis within either internal carotid artery. Electronically Signed   By: Sandi Mariscal M.D.   On: 01/19/2018 08:10   Dg Chest Portable 1 View  Result Date: 01/18/2018 CLINICAL DATA:  Weakness and left upper extremity numbness EXAM: PORTABLE CHEST 1 VIEW COMPARISON:  November 04, 2017 FINDINGS: No edema or consolidation. Heart size and pulmonary  vascularity are normal. No adenopathy. There is aortic atherosclerosis. No evident bone lesion. IMPRESSION: Aortic atherosclerosis.  No edema or consolidation. Aortic Atherosclerosis (ICD10-I70.0). Electronically Signed   By: Lowella Grip III M.D.   On: 01/18/2018 11:34    EKG:   Orders placed or performed during the hospital encounter of 01/18/18  . ED EKG  . ED EKG  . EKG 12-Lead  . EKG 12-Lead      Management plans discussed with the patient, family and they are in agreement.  CODE STATUS:     Code Status Orders  (From admission, onward)        Start     Ordered   01/18/18 1203  Do not attempt resuscitation (DNR)  Continuous    Question Answer Comment  In the event of cardiac or respiratory ARREST Do not call a "code blue"   In the event of cardiac or respiratory ARREST Do not perform Intubation, CPR, defibrillation or ACLS   In the event of cardiac or respiratory ARREST Use medication by any route, position, wound care, and other measures to relive pain and suffering. May use oxygen, suction and manual treatment of airway obstruction as needed for comfort.   Comments nurse may pronounce      01/18/18 1202    Code Status History    Date Active Date Inactive Code Status Order ID Comments User Context   11/04/2017 20:09 11/07/2017 17:02 Full Code [id number]  Hillary Bow, MD ED   02/15/2016 18:17 02/17/2016 15:48 Full Code [id number]  Bettey Costa, MD Inpatient      TOTAL TIME TAKING CARE OF THIS PATIENT: 45 minutes.    Avel Peace Salary M.D on 01/19/2018 at 11:23 AM  Between 7am to 6pm - Pager - [phone]  After 6pm go to [email or web address] - password [email or web address] [facility]  Office  [phone]  CC: Primary care physician; Kathrine Haddock, NP   Note: This dictation was prepared with Dragon dictation along with smaller phrase technology. Any transcriptional errors that result from this process are unintentional.

## 2018-01-19 NOTE — Progress Notes (Signed)
Discharge instructions given and went over with patient and patients daughter at bedside. Prescriptions reviewed. Follow-up appointments discussed and reviewed. All questions answered. Patient discharged home via wheelchair by nursing staff. Madlyn Frankel, RN

## 2018-01-19 NOTE — Evaluation (Signed)
Physical Therapy Evaluation Patient Details Name: Sarah Freeman MRN: [id number] DOB: 08-01-1931 Today's Date: 01/19/2018   History of Present Illness  Pt is an 84 y.o. female presenting to hospital with L UE weakness, numbness, tingling, and incoordination.  MRI showing small areas acute infarction R parietal deep white matter and a single R parietal gyrus.  PMH includes TIA (watershed infarct 2 years ago), urinary incontinence.  Clinical Impression  Prior to hospital admission, pt was independent.  Pt lives alone on main level of home with 3 steps to enter with L grab bar; no h/o recent falls.  Currently pt is independent with bed mobility, transfers, ambulation, and modified independent navigating 3 stairs with L railing.  No loss of balance noted with functional mobility during session.  No c/o pain.  No significant LE impairments noted.  Impaired L UE coordination (finger to nose) and fine motor noted (plan for OT evaluation today).  No further PT needs identified.  Will discharge pt from PT in house.  Please re-consult PT if pt's status changes and acute PT needs are identified.    Follow Up Recommendations No PT follow up    Equipment Recommendations  None recommended by PT    Recommendations for Other Services       Precautions / Restrictions Precautions Precautions: Fall Restrictions Weight Bearing Restrictions: No      Mobility  Bed Mobility Overal bed mobility: Independent             General bed mobility comments: Supine to/from sit without any difficulties.  Transfers Overall transfer level: Independent Equipment used: None             General transfer comment: steady safe transfers  Ambulation/Gait Ambulation/Gait assistance: Independent Ambulation Distance (Feet): 200 Feet Assistive device: None Gait Pattern/deviations: Step-through pattern Gait velocity: mildly decreased   General Gait Details: mild increased BOS but steady  Stairs Stairs:  Yes Stairs assistance: Supervision Stair Management: One rail Left;Step to pattern;Forwards Number of Stairs: 3 General stair comments: steady and safe technique  Wheelchair Mobility    Modified Rankin (Stroke Patients Only)       Balance Overall balance assessment: Needs assistance;Independent Sitting-balance support: No upper extremity supported;Feet supported Sitting balance-Leahy Scale: Normal Sitting balance - Comments: steady sitting reaching outside BOS   Standing balance support: No upper extremity supported;During functional activity Standing balance-Leahy Scale: Normal Standing balance comment: no loss of balance with ambulation             High level balance activites: Turns;Sudden stops(deferred head turns d/t pt reporting that makes her dizzy at baseline)               Pertinent Vitals/Pain Pain Assessment: No/denies pain  HR 81-108 bpm during most of session (HR increased to 127 bpm after stairs but after a few minutes of standing rest break HR decreased)    Home Living Family/patient expects to be discharged to:: Private residence Living Arrangements: Alone Available Help at Discharge: Family(Family lives close by) Type of Home: House Home Access: Stairs to enter   [employer] of Steps: 3 with L grab bar Home Layout: One level(with basement (does not have to go to)) Home Equipment: Walker - 2 wheels;Toilet riser;Tub bench      Prior Function Level of Independence: Independent         Comments: Tends to hold onto furniture when ambulating; reports no falls in past 6 months.     Hand Dominance   Dominant Hand: Right  Extremity/Trunk Assessment   Upper Extremity Assessment Upper Extremity Assessment: Defer to OT evaluation(Impaired L UE finger to nose testing noted; decreased fine motor L hand noted)    Lower Extremity Assessment Lower Extremity Assessment: (4+/5 B hip flexion, knee flexion/extension, and DF.  Intact B  LE sensation, tone, proprioception, and heel to shin coordination.)    Cervical / Trunk Assessment Cervical / Trunk Assessment: Normal  Communication   Communication: No difficulties  Cognition Arousal/Alertness: Awake/alert Behavior During Therapy: WFL for tasks assessed/performed Overall Cognitive Status: Within Functional Limits for tasks assessed                                        General Comments General comments (skin integrity, edema, etc.): pt standing at bed making her bed upon PT arrival; son present.  Nursing cleared pt for participation in physical therapy.  Pt agreeable to PT session.    Exercises     Assessment/Plan    PT Assessment Patent does not need any further PT services  PT Problem List         PT Treatment Interventions      PT Goals (Current goals can be found in the Care Plan section)  Acute Rehab PT Goals Patient Stated Goal: to go home PT Goal Formulation: With patient Time For Goal Achievement: 02/02/18 Potential to Achieve Goals: Good    Frequency     Barriers to discharge        Co-evaluation               AM-PAC PT "6 Clicks" Daily Activity  Outcome Measure Difficulty turning over in bed (including adjusting bedclothes, sheets and blankets)?: None Difficulty moving from lying on back to sitting on the side of the bed? : None Difficulty sitting down on and standing up from a chair with arms (e.g., wheelchair, bedside commode, etc,.)?: None Help needed moving to and from a bed to chair (including a wheelchair)?: None Help needed walking in hospital room?: None Help needed climbing 3-5 steps with a railing? : A Little 6 Click Score: 23    End of Session Equipment Utilized During Treatment: Gait belt Activity Tolerance: Patient tolerated treatment well Patient left: in bed;with call bell/phone within reach;with family/visitor present Nurse Communication: Mobility status;Precautions PT Visit Diagnosis: Other  abnormalities of gait and mobility (R26.89)    Time: 2876-8115 PT Time Calculation (min) (ACUTE ONLY): 45 min   Charges:   PT Evaluation $PT Eval Low Complexity: 1 Low     PT G CodesLeitha Bleak, PT 01/19/18, 10:00 AM [phone]

## 2018-01-19 NOTE — Plan of Care (Signed)
  Education: Knowledge of disease or condition will improve 01/19/2018 0152 - Progressing by Scharlene Gloss, RN   Education: Knowledge of secondary prevention will improve 01/19/2018 [phone] - Progressing by Scharlene Gloss, RN   Education: Knowledge of patient specific risk factors addressed and post discharge goals established will improve 01/19/2018 0152 - Progressing by Scharlene Gloss, RN   Nutrition: Risk of aspiration will decrease 01/19/2018 0152 - Progressing by Scharlene Gloss, RN   Activity: Risk for activity intolerance will decrease 01/19/2018 0152 - Progressing by Scharlene Gloss, RN   Nutrition: Adequate nutrition will be maintained 01/19/2018 0152 - Progressing by Scharlene Gloss, RN

## 2018-01-19 NOTE — Evaluation (Signed)
Occupational Therapy Evaluation Patient Details Name: Sarah Freeman MRN: [id number] DOB: November 13, 1931 Today's Date: 01/19/2018    History of Present Illness Pt is an 84 y.o. female presenting to hospital with L UE weakness, numbness, tingling, and incoordination.  MRI showing small areas acute infarction R parietal deep white matter and a single R parietal gyrus.  PMH includes TIA (watershed infarct 2 years ago), urinary incontinence.   Clinical Impression   Pt seen for OT evaluation this date. Pt independent at baseline, eager to return home at [facility]. Pt endorses sensory impairments have resolved and confirmed with assessment. Pt presents with mild LUE strength and fine motor coordination deficits. Functionally, no difficulty performing ADL and mobility near baseline independence. Pt/family instructed in fine motor coordination exercises and standing balance exercises with handouts provided. Pt/family verbalized understanding. OT recommended pt/family consider pelvic health specialist PT in OP for bladder mgt needs in the future, should pt be interested, given that she endorses "leakage" and is currently working with her urologist to manage it. Pt/family appreciative of suggestion. No additional skilled OT needs at this time. Will sign off. Please re-consult if additional needs arise.     Follow Up Recommendations  No OT follow up    Equipment Recommendations  None recommended by OT    Recommendations for Other Services       Precautions / Restrictions Precautions Precautions: Fall Restrictions Weight Bearing Restrictions: No      Mobility Bed Mobility Overal bed mobility: Independent             General bed mobility comments: No difficulties.  Transfers Overall transfer level: Independent Equipment used: None             General transfer comment: steady safe transfers    Balance Overall balance assessment: Needs assistance;Independent Sitting-balance support: No  upper extremity supported;Feet supported Sitting balance-Leahy Scale: Normal Sitting balance - Comments: steady sitting reaching outside BOS   Standing balance support: No upper extremity supported;During functional activity Standing balance-Leahy Scale: Good Standing balance comment: occasionally will reach for fingertip touch on furniture (her baseline)                          ADL either performed or assessed with clinical judgement   ADL Overall ADL's : At baseline;Independent                                       General ADL Comments: pt able to perform all ADL tasks grossly at baseline independence     Vision Baseline Vision/History: Wears glasses Wears Glasses: At all times Patient Visual Report: No change from baseline Vision Assessment?: No apparent visual deficits     Perception     Praxis      Pertinent Vitals/Pain Pain Assessment: No/denies pain     Hand Dominance Right   Extremity/Trunk Assessment Upper Extremity Assessment Upper Extremity Assessment: LUE deficits/detail(RUE WFL) LUE Deficits / Details: 4+/5 grossly, mildly impaired FMC with finger to nose, RAM, and thumb opposition testing, sensation intact, no significant functional limitations LUE Coordination: decreased fine motor   Lower Extremity Assessment Lower Extremity Assessment: Defer to PT evaluation   Cervical / Trunk Assessment Cervical / Trunk Assessment: Normal   Communication Communication Communication: No difficulties   Cognition Arousal/Alertness: Awake/alert Behavior During Therapy: WFL for tasks assessed/performed Overall Cognitive Status: Within Functional Limits for tasks assessed  General Comments      Exercises Other Exercises Other Exercises: Pt/family instructed in fine motor coordination exercises and standing balance exercises with handouts provided. Pt/family verbalized understanding.    Shoulder Instructions      Home Living Family/patient expects to be discharged to:: Private residence Living Arrangements: Alone Available Help at Discharge: Family(family lives close by) Type of Home: House Home Access: Stairs to enter [employer] of Steps: 3 with L grab bar   Home Layout: One level(has basement, doesn't need)     Bathroom Shower/Tub: Tub/shower unit(sits on side of tub or takes tub baths)   Bathroom Toilet: Standard     Home Equipment: Environmental consultant - 2 wheels;Toilet riser;Tub bench;Grab bars - tub/shower          Prior Functioning/Environment Level of Independence: Independent        Comments: Tends to hold onto furniture when ambulating; reports no falls in past 6 months.        OT Problem List:        OT Treatment/Interventions:      OT Goals(Current goals can be found in the care plan section) Acute Rehab OT Goals Patient Stated Goal: to go home OT Goal Formulation: All assessment and education complete, DC therapy  OT Frequency:     Barriers to D/C:            Co-evaluation              AM-PAC PT "6 Clicks" Daily Activity     Outcome Measure Help from another person eating meals?: None Help from another person taking care of personal grooming?: None Help from another person toileting, which includes using toliet, bedpan, or urinal?: None Help from another person bathing (including washing, rinsing, drying)?: None Help from another person to put on and taking off regular upper body clothing?: None Help from another person to put on and taking off regular lower body clothing?: None 6 Click Score: 24   End of Session    Activity Tolerance: Patient tolerated treatment well Patient left: in bed;with family/visitor present(seated EOB to eat lunch with family present)  OT Visit Diagnosis: Other abnormalities of gait and mobility (R26.89)                Time: 8295-6213 OT Time Calculation (min): 42 min Charges:  OT  General Charges $OT Visit: 1 Visit OT Evaluation $OT Eval Low Complexity: 1 Low OT Treatments $Therapeutic Exercise: 23-37 mins  Jeni Salles, MPH, MS, OTR/L ascom [phone] 01/19/18, 12:28 PM

## 2018-01-19 NOTE — Care Management Obs Status (Signed)
[facility] NOTIFICATION   Patient Details  Name: Sarah Freeman MRN: [id number] Date of Birth: 11-Aug-1931   Medicare Observation Status Notification Given:  Yes    Shelbie Ammons, RN 01/19/2018, 1:09 PM

## 2018-01-20 ENCOUNTER — Telehealth: Payer: Self-pay

## 2018-01-20 NOTE — Telephone Encounter (Signed)
Transition Care Management Follow-up Telephone Call   Date discharged? 01/19/2018   How have you been since you were released from the hospital? "fine"   Do you understand why you were in the hospital? yes   Do you understand the discharge instructions? yes   Where were you discharged to? home   Items Reviewed:  Medications reviewed: yes  Allergies reviewed: yes  Dietary changes reviewed: yes  Referrals reviewed: yes   Functional Questionnaire:   Activities of Daily Living (ADLs):   She states they are independent in the following: ambulation, bathing and hygiene, feeding, continence, grooming, toileting and dressing States they require assistance with the following: none   Any transportation issues/concerns?: no   Any patient concerns? no   Confirmed importance and date/time of follow-up visits scheduled yes  Provider Appointment booked with Regino Schultze on 01/25/2018 at 2:00pm  Confirmed with patient if condition begins to worsen call PCP or go to the ER.  Patient was given the office number and encouraged to call back with question or concerns.  : yes

## 2018-01-25 ENCOUNTER — Encounter: Payer: Self-pay | Admitting: Unknown Physician Specialty

## 2018-01-25 ENCOUNTER — Ambulatory Visit (INDEPENDENT_AMBULATORY_CARE_PROVIDER_SITE_OTHER): Payer: Medicare Other | Admitting: Unknown Physician Specialty

## 2018-01-25 DIAGNOSIS — E7800 Pure hypercholesterolemia, unspecified: Secondary | ICD-10-CM | POA: Diagnosis not present

## 2018-01-25 DIAGNOSIS — I63311 Cerebral infarction due to thrombosis of right middle cerebral artery: Secondary | ICD-10-CM | POA: Diagnosis not present

## 2018-01-25 DIAGNOSIS — I7090 Unspecified atherosclerosis: Secondary | ICD-10-CM | POA: Insufficient documentation

## 2018-01-25 NOTE — Progress Notes (Signed)
BP 125/81   Pulse 74   Temp 97.8 F (36.6 C) (Oral)   Wt 185 lb (83.9 kg)   SpO2 96%   BMI 36.13 kg/m    Subjective:    Patient ID: Sarah Freeman, female    DOB: 05/09/1931, 84 y.o.   MRN: [id number]  HPI: Sarah Freeman is a 84 y.o. female  Chief Complaint  Patient presents with  . Hospitalization Follow-up   Pt was in the hospital for a right CVA.  Noted a tingling sensation in left arm  Had a complete w/u in the hospital.  CT scan, [place] of carotid arteries, MRI, Chest x-ray, and Echocardiogram all reviewed.  MRI consistent with small vessel ishemic changes. Carotid US consistent with moderate plaque without stenosis. Already has appointments set up with Dr. Melrose Nakayama and Dr. Rockey Situ for f/u neurology and cardiology.  Started on Pravastatin and is tolerating OK as opposed to other statins she has tried.    Today she feels well without any current stroke symptoms.  Doing all ADLs.  History of recurrent UTI's.  Last urine was clear.  Under care of Urology  Relevant past medical, surgical, family and social history reviewed and updated as indicated. Interim medical history since our last visit reviewed. Allergies and medications reviewed and updated.  Review of Systems  Constitutional: Negative.   HENT: Negative.   Eyes: Negative.   Respiratory: Negative.   Cardiovascular: Negative.   Gastrointestinal: Negative.   Endocrine: Negative.   Genitourinary: Negative.   Musculoskeletal: Negative.   Skin: Negative.   Allergic/Immunologic: Negative.   Neurological: Negative.   Hematological: Negative.   Psychiatric/Behavioral: Negative.     Per HPI unless specifically indicated above     Objective:    BP 125/81   Pulse 74   Temp 97.8 F (36.6 C) (Oral)   Wt 185 lb (83.9 kg)   SpO2 96%   BMI 36.13 kg/m   Wt Readings from Last 3 Encounters:  01/25/18 185 lb (83.9 kg)  01/18/18 224 lb 7 oz (101.8 kg)  01/02/18 186 lb 12.8 oz (84.7 kg)    Physical Exam    Constitutional: She is oriented to person, place, and time. She appears well-developed and well-nourished. No distress.  HENT:  Head: Normocephalic and atraumatic.  Eyes: Conjunctivae and lids are normal. Right eye exhibits no discharge. Left eye exhibits no discharge. No scleral icterus.  Neck: Normal range of motion. Neck supple. No JVD present. Carotid bruit is not present.  Cardiovascular: Normal rate, regular rhythm and normal heart sounds.  Pulmonary/Chest: Effort normal and breath sounds normal.  Abdominal: Normal appearance. There is no splenomegaly or hepatomegaly.  Musculoskeletal: Normal range of motion.  Neurological: She is alert and oriented to person, place, and time.  Skin: Skin is warm, dry and intact. No rash noted. No pallor.  Psychiatric: She has a normal mood and affect. Her behavior is normal. Judgment and thought content normal.      Assessment & Plan:   Problem List Items Addressed This Visit      Unprioritized   Atherosclerosis    Bilateral carotids.  Will refer to cardiology for complete evaluation of coronaries.        Hypercholesterolemia    Tolerating Pravastatin at this time.  We discussed PSK9's in the past.  Last LDL was 88      Stroke (cerebrum) (HCC)    Stable following stroke.  Referrals to Neurology and Cardiology next week.  Continue on Plavix and  Pravastatin.            Follow up plan: Return in about 4 weeks (around 02/22/2018) for November.  Pt refusing earlier visit.

## 2018-01-25 NOTE — Assessment & Plan Note (Addendum)
Tolerating Pravastatin at this time.  We discussed PSK9's in the past.  Last LDL was 88

## 2018-01-25 NOTE — Assessment & Plan Note (Signed)
Stable following stroke.  Referrals to Neurology and Cardiology next week.  Continue on Plavix and Pravastatin.

## 2018-01-25 NOTE — Assessment & Plan Note (Signed)
Bilateral carotids.  Will refer to cardiology for complete evaluation of coronaries.

## 2018-02-01 DIAGNOSIS — I6529 Occlusion and stenosis of unspecified carotid artery: Secondary | ICD-10-CM | POA: Insufficient documentation

## 2018-02-01 NOTE — Progress Notes (Signed)
Cardiology Office Note  Date:  02/02/2018   ID:  Sarah Freeman, DOB [birth year], MRN [id number]  PCP:  Kathrine Haddock, NP   Chief Complaint  Patient presents with  . other    The patient was at [facility] hospital with a stroke and was told to follow up with a cardiologist. Meds reviewed by the pt. verbally.     HPI:  Sarah Freeman is a 84 year old woman with past medical history of  History of stroke watershed infarct 2 years ago, distribution infarcts in the right  MCA. Discharged on aspirin Plavix March 2017 Urinary tract infections  Repeat hospitalization February 2019 for recurrent stroke Who presents by self referral for evaluation of recent stroke symptoms  Recent hospital admission February 2019 Left arm weakness, incoordination, numbness MRI noted for right parietal infarct carotid Dopplers with plaque, no hemodynamically significant stenosis echocardiogram noted for normal ejection fraction with stage I diastolic dysfunction Hospital records reviewed with the patient in detail HerDischarged on aspirin Plavix UTI with E. coli Seen by neurology who recommended cardiology follow-up for event monitor  She was seen by neurology but no monitor was ordered  LDL lab work reviewed 150 improved on low-dose pravastatin,  She does not really want a statin Reports she has made a full recovery after all of her strokes   Family of MI and CVA  Nonsmoker Diabetes Total chol 170, pravastatin  EKG personally reviewed by myself on todays visit Normal sinus rhythm rate 69 bpm no significant ST or T wave changes  Other records reviewed with her on today's visit MRI  01/18/18 shows Background pattern of chronic small vessel ischemic changes of the cerebral hemispheric white matter an old watershed infarction within the right hemispheric white matter and right frontal cortical and subcortical brain. Few small areas of acute infarction are present in the right parietal deep white  matter and a single right parietal gyrus, possibly the precentral gyrus. No swelling or hemorrhage.   DATA SUMMARIES:  01/18/2018  CT HEAD WO CONTRAST  IMPRESSION: No acute abnormality. Atrophy and chronic microvascular ischemic change. Atherosclerosis. Small right mastoid effusion.  01/18/2018  MR BRAIN Wo Contrast  IMPRESSION: Background pattern of chronic small vessel ischemic changes of the cerebral hemispheric white matter an old watershed infarction within the right hemispheric white matter and right frontal cortical and subcortical brain. Few small areas of acute infarction are present in the right parietal deep white matter and a single right parietal gyrus, possibly the precentral gyrus. No swelling or hemorrhage.   EXAM: BILATERAL CAROTID DUPLEX ULTRASOUND  TECHNIQUE: Pearline Cables scale imaging, color Doppler and duplex ultrasound were performed of bilateral carotid and vertebral arteries in the neck.  COMPARISON:None.  FINDINGS: Criteria: Quantification of carotid stenosis is based on velocity parameters that correlate the residual internal carotid diameter with NASCET-based stenosis levels, using the diameter of the distal internal carotid lumen as the denominator for stenosis measurement.  The following velocity measurements were obtained:  RIGHT  ICA:106/16 cm/sec  AYT:01/60 cm/sec  SYSTOLIC ICA/CCA FUXNA:3.5  DIASTOLIC ICA/CCA TDDUK:0.2  ECA:83 cm/sec  LEFT  ICA:182/26 cm/sec  RKY:706/23 cm/sec  SYSTOLIC ICA/CCA JSEGB:1.5  DIASTOLIC ICA/CCA VVOHY:0.7  ECA:82 cm/sec  RIGHT CAROTID ARTERY: There is a minimal to moderate amount of atherosclerotic plaque within the right carotid bulb (image 15), not resulting in elevated peak systolic velocities within the interrogated course the right internal carotid artery to suggest a hemodynamically significant stenosis.  RIGHT VERTEBRAL ARTERY:Antegrade Flow  LEFT CAROTID ARTERY:  There  is a minimal amount of atherosclerotic plaque within the left carotid bulb (image 47), not resulting in elevated peak systolic velocities within the interrogated course the left internal carotid artery to suggest a hemodynamically significant stenosis.  LEFT VERTEBRAL ARTERY:Antegrade flow  IMPRESSION: Minimal to moderate amount of bilateral atherosclerotic plaque, right greater than left, not resulting in a hemodynamically significant stenosis within either internal carotid artery.        PMH:   has a past medical history of Hypothyroid, Stroke ([place]), and Urinary incontinence.  PSH:    Past Surgical History:  Procedure Laterality Date  . ABDOMINAL HYSTERECTOMY     age 29  . BREAST BIOPSY Left 2000?   neg. dr. Dwyane Luo office  . CHOLECYSTECTOMY  1964  . COLONOSCOPY     Dr Nicolasa Ducking  . DILATION AND CURETTAGE OF UTERUS    . EYE SURGERY    . SKIN CANCER EXCISION     face    Current Outpatient Medications  Medication Sig Dispense Refill  . acetaminophen (TYLENOL ARTHRITIS PAIN) 650 MG CR tablet Take 650 mg by mouth every 8 (eight) hours as needed for pain.    Marland Kitchen aspirin 81 MG EC tablet Take 1 tablet (81 mg total) by mouth daily. 180 tablet 0  . clopidogrel (PLAVIX) 75 MG tablet Take 1 tablet (75 mg total) by mouth daily. 90 tablet 0  . diphenhydramine-acetaminophen (TYLENOL PM) 25-500 MG TABS tablet Take 1 tablet by mouth at bedtime as needed.    Marland Kitchen levothyroxine (SYNTHROID, LEVOTHROID) 50 MCG tablet Take 1 tablet (50 mcg total) by mouth at bedtime. 90 tablet 0  . pravastatin (PRAVACHOL) 10 MG tablet Take 1 tablet (10 mg total) by mouth daily at 6 PM. 90 tablet 0  . Red Yeast Rice Extract (RED YEAST RICE PO) Take 1 tablet by mouth daily.     No current facility-administered medications for this visit.      Allergies:   Neosporin [neomycin-polymyxin-gramicidin] and Statins   Social History:  The patient  reports that  has never smoked. she has never used smokeless  tobacco. She reports that she does not drink alcohol or use drugs.   Family History:   family history includes CAD in her mother; Cancer in her father; Diabetes in her maternal grandmother; Failure to thrive in her mother; Heart disease in her daughter, maternal grandfather, maternal grandmother, and son; Hyperlipidemia in her daughter; Hypertension in her sister; Stroke in her father.    Review of Systems: Review of Systems  Constitutional: Negative.   Respiratory: Negative.   Cardiovascular: Negative.   Gastrointestinal: Negative.   Musculoskeletal: Negative.   Neurological: Negative.   Psychiatric/Behavioral: Negative.   All other systems reviewed and are negative.    PHYSICAL EXAM: VS:  BP 120/80 (BP Location: Left Arm, Patient Position: Sitting, Cuff Size: Normal)   Pulse 69   Ht 5' (1.524 m)   Wt 186 lb (84.4 kg)   BMI 36.33 kg/m  , BMI Body mass index is 36.33 kg/m. GEN: Well nourished, well developed, in no acute distress  HEENT: normal  Neck: no JVD, carotid bruits, or masses Cardiac: RRR; no murmurs, rubs, or gallops,no edema  Respiratory:  clear to auscultation bilaterally, normal work of breathing GI: soft, nontender, nondistended, + BS MS: no deformity or atrophy  Skin: warm and dry, no rash Neuro:  Strength and sensation are intact Psych: euthymic mood, full affect    Recent Labs: 10/05/2017: TSH 4.520 01/18/2018: ALT 16 01/19/2018: BUN 11; Creatinine,  Ser 0.92; Hemoglobin 12.8; Platelets 285; Potassium 4.1; Sodium 138    Lipid Panel Lab Results  Component Value Date   CHOL 170 01/19/2018   HDL 43 01/19/2018   LDLCALC 88 01/19/2018   TRIG 196 (H) 01/19/2018      Wt Readings from Last 3 Encounters:  02/02/18 186 lb (84.4 kg)  01/25/18 185 lb (83.9 kg)  01/18/18 224 lb 7 oz (101.8 kg)       ASSESSMENT AND PLAN:  Cerebrovascular accident (CVA) due to thrombosis of right middle cerebral artery ([place]) - Plan: EKG 12-Lead Long discussion with  her Recommended TEE to rule out thrombus Then recommended implantation of loop device High risk that she is having proximal atrial fibrillation given her recurrent strokes  Bilateral carotid artery stenosis Mild to moderate disease also with vertebral artery disease Stressed the importance of aggressive lipid management She is reluctant to take more statin Currently on aspirin and Plavix  Mixed hyperlipidemia - Plan: EKG 12-Lead As above reluctant to take more statin LDL not at goal but much better  Morbid obesity We have encouraged continued exercise, careful diet management in an effort to lose weight.   Disposition:   F/U  6 months  Case discussed with Dr. Jolyn Nap  Total encounter time more than 60 minutes  Greater than 50% was spent in counseling and coordination of care with the patient    Orders Placed This Encounter  Procedures  . EKG 12-Lead     Signed, Esmond Plants, M.D., Ph.D. 02/02/2018  [place], [place]

## 2018-02-02 ENCOUNTER — Encounter: Payer: Self-pay | Admitting: Cardiovascular Disease

## 2018-02-02 ENCOUNTER — Ambulatory Visit (INDEPENDENT_AMBULATORY_CARE_PROVIDER_SITE_OTHER): Payer: Medicare Other | Admitting: Cardiovascular Disease

## 2018-02-02 VITALS — BP 120/80 | HR 69 | Ht 60.0 in | Wt 186.0 lb

## 2018-02-02 DIAGNOSIS — I6523 Occlusion and stenosis of bilateral carotid arteries: Secondary | ICD-10-CM | POA: Diagnosis not present

## 2018-02-02 DIAGNOSIS — E782 Mixed hyperlipidemia: Secondary | ICD-10-CM | POA: Diagnosis not present

## 2018-02-02 DIAGNOSIS — I63311 Cerebral infarction due to thrombosis of right middle cerebral artery: Secondary | ICD-10-CM

## 2018-02-02 NOTE — Patient Instructions (Addendum)
Medication Instructions:   No medication changes made  Labwork:  No new labs needed  Testing/Procedures: We will schedule a TEE for CVA/stroke Followed by a loop monitor for possible atrial fibrillation and CVA  * Nothing to eat or drink after midnight the night before. * Take morning medications with small sip of water.  Report to [facility] Entrance on Thursday March 14th at 06:45 AM and check in at the first desk on the right.    Follow-Up: It was a pleasure seeing you in the office today. Please call us if you have new issues that need to be addressed before your next appt.  [phone]  Your physician wants you to follow-up in: 6 months.  You will receive a reminder letter in the mail two months in advance. If you don't receive a letter, please call our office to schedule the follow-up appointment.  If you need a refill on your cardiac medications before your next appointment, please call your pharmacy.  For educational health videos Log in to : [email or web address] Or : [email or web address], password : [email or web address]

## 2018-02-03 NOTE — Telephone Encounter (Signed)
Error

## 2018-02-07 ENCOUNTER — Other Ambulatory Visit: Payer: Self-pay | Admitting: Cardiovascular Disease

## 2018-02-07 DIAGNOSIS — I639 Cerebral infarction, unspecified: Secondary | ICD-10-CM

## 2018-02-08 ENCOUNTER — Telehealth: Payer: Self-pay | Admitting: Cardiovascular Disease

## 2018-02-08 NOTE — Telephone Encounter (Signed)
Spoke with patients son per release form and he confirmed arrival time at [place] tomorrow for her procedures. He had no further questions and was appreciative for the call.

## 2018-02-09 ENCOUNTER — Encounter: Payer: Self-pay | Admitting: *Deleted

## 2018-02-09 ENCOUNTER — Ambulatory Visit
Admission: RE | Admit: 2018-02-09 | Discharge: 2018-02-09 | Disposition: A | Discharge status: Home / Self Care | Payer: Medicare Other | Source: Ambulatory Visit | Attending: Cardiovascular Disease | Admitting: Cardiovascular Disease

## 2018-02-09 ENCOUNTER — Encounter
Admission: RE | Disposition: A | Discharge status: Home / Self Care | Payer: Self-pay | Source: Ambulatory Visit | Attending: Cardiovascular Disease

## 2018-02-09 DIAGNOSIS — I6389 Other cerebral infarction: Secondary | ICD-10-CM | POA: Diagnosis not present

## 2018-02-09 DIAGNOSIS — I700 Atherosclerosis of aorta: Secondary | ICD-10-CM | POA: Diagnosis not present

## 2018-02-09 DIAGNOSIS — I639 Cerebral infarction, unspecified: Secondary | ICD-10-CM | POA: Insufficient documentation

## 2018-02-09 HISTORY — PX: TEE WITHOUT CARDIOVERSION: SHX5443

## 2018-02-09 HISTORY — PX: LOOP RECORDER INSERTION: EP1214

## 2018-02-09 HISTORY — DX: Hyperlipidemia, unspecified: E78.5

## 2018-02-09 SURGERY — ECHOCARDIOGRAM, TRANSESOPHAGEAL
Anesthesia: Moderate Sedation

## 2018-02-09 SURGERY — LOOP RECORDER INSERTION
Anesthesia: LOCAL

## 2018-02-09 MED ORDER — MIDAZOLAM HCL 5 MG/5ML IJ SOLN
INTRAMUSCULAR | Status: AC
  Filled 2018-02-09: qty 5

## 2018-02-09 MED ORDER — LIDOCAINE VISCOUS 2 % MT SOLN
OROMUCOSAL | Status: AC
  Filled 2018-02-09: qty 15

## 2018-02-09 MED ORDER — SODIUM CHLORIDE 0.9 % IV SOLN: INTRAVENOUS | Status: DC

## 2018-02-09 MED ORDER — LIDOCAINE-EPINEPHRINE 1 %-1:100000 IJ SOLN
INTRAMUSCULAR | Status: DC | PRN
  Administered 2018-02-09: 20 mL

## 2018-02-09 MED ORDER — SODIUM CHLORIDE FLUSH 0.9 % IV SOLN
INTRAVENOUS | Status: AC
  Filled 2018-02-09: qty 10

## 2018-02-09 MED ORDER — BUTAMBEN-TETRACAINE-BENZOCAINE 2-2-14 % EX AERO
INHALATION_SPRAY | CUTANEOUS | Status: AC
  Filled 2018-02-09: qty 5

## 2018-02-09 MED ORDER — FENTANYL CITRATE (PF) 100 MCG/2ML IJ SOLN
INTRAMUSCULAR | Status: AC
  Filled 2018-02-09: qty 2

## 2018-02-09 MED ORDER — FENTANYL CITRATE (PF) 100 MCG/2ML IJ SOLN
INTRAMUSCULAR | Status: AC | PRN
  Administered 2018-02-09 (×2): 25 ug via INTRAVENOUS

## 2018-02-09 MED ORDER — MIDAZOLAM HCL 2 MG/2ML IJ SOLN
INTRAMUSCULAR | Status: AC | PRN
  Administered 2018-02-09 (×2): 1 mg via INTRAVENOUS

## 2018-02-09 MED ORDER — LIDOCAINE-EPINEPHRINE (PF) 1 %-1:200000 IJ SOLN
INTRAMUSCULAR | Status: AC
  Filled 2018-02-09: qty 30

## 2018-02-09 SURGICAL SUPPLY — 2 items
LOOP REVEAL LINQSYS (Prosthesis & Implant Heart) ×2 IMPLANT
PACK LOOP INSERTION (CUSTOM PROCEDURE TRAY) ×2 IMPLANT

## 2018-02-09 NOTE — Procedures (Signed)
Transesophageal Echocardiogram :  Indication: CVA, rule out thrombus Requesting/ordering  physician: Rockey Situ, tim  Procedure: Benzocaine spray x2 and 2 mls x 2 of viscous lidocaine were given orally to provide local anesthesia to the oropharynx. The patient was positioned supine on the left side, bite block provided. The patient was moderately sedated with the doses of versed and fentanyl as detailed below.  Using digital technique an omniplane probe was advanced into the distal esophagus without incident.   Moderate sedation: 1. Sedation used:  Versed:2 mg, Fentanyl: 50 ug 2. Time administered:     Time when patient started recovery: 3. I was face to face during this time  See report in EPIC  for complete details: In brief, transgastric imaging revealed normal LV function with no RWMAs and no mural apical thrombus.  .  Estimated ejection fraction was 55 %.  Right sided cardiac chambers were normal with no evidence of pulmonary hypertension.  Imaging of the septum showed no ASD or VSD Bubble study was negative for shunt 2D and color flow confirmed no PFO  The LA was well visualized in orthogonal views.  There was no spontaneous contrast and no thrombus in the LA and LA appendage   The aortic arch and descending thoracic aorta had moderate mural aortic debris   no evidence of aneurysmal dilation or disection  Discussed with Dr. Caryl Comes, He plans on placing the reveal device/loop monitor given hx of CVA, possible atrial fibrillation  Ida Rogue 02/09/2018 10:51 AM

## 2018-02-09 NOTE — Discharge Instructions (Signed)
Leave Bandage on for 5 days May Shower  tomorrow

## 2018-02-09 NOTE — Progress Notes (Signed)
*  PRELIMINARY RESULTS* Echocardiogram Echocardiogram Transesophageal has been performed.  Sarah Freeman 02/09/2018, 9:15 AM

## 2018-02-10 ENCOUNTER — Encounter: Payer: Self-pay | Admitting: Cardiovascular Disease

## 2018-02-12 NOTE — H&P (Signed)
H&P Addendum, pre-transesophageal echo for CVA  Patient was seen and evaluated prior to -cardioversion procedure Symptoms, prior testing details again confirmed with the patient Patient examined, no significant change from prior exam Lab work reviewed in detail personally by myself Patient understands risk and benefit of the procedure, willing to proceed  Signed, Esmond Plants, MD, Ph.D [facility]

## 2018-02-23 ENCOUNTER — Ambulatory Visit (INDEPENDENT_AMBULATORY_CARE_PROVIDER_SITE_OTHER): Payer: Self-pay | Admitting: *Deleted

## 2018-02-23 DIAGNOSIS — I639 Cerebral infarction, unspecified: Secondary | ICD-10-CM

## 2018-02-23 LAB — CUP PACEART INCLINIC DEVICE CHECK
MDC IDC PG IMPLANT DT: 20190314
MDC IDC SESS DTM: 20190328140918

## 2018-02-23 NOTE — Progress Notes (Signed)
Wound Loop check in clinic. Steri-Strips removed, incision edges approximated, wound well healed. Battery status: Good . R-waves 0.35mV. 0 symptom episodes, 0 tachy episodes pause and brady off. 0  AF episodes.  Monthly summary reports and ROV with SK PRN.

## 2018-02-27 ENCOUNTER — Encounter: Payer: Self-pay | Admitting: Urology

## 2018-02-27 ENCOUNTER — Ambulatory Visit (INDEPENDENT_AMBULATORY_CARE_PROVIDER_SITE_OTHER): Payer: Medicare Other | Admitting: Urology

## 2018-02-27 ENCOUNTER — Telehealth: Payer: Self-pay | Admitting: Urology

## 2018-02-27 VITALS — BP 119/71 | HR 86 | Ht 60.0 in | Wt 186.1 lb

## 2018-02-27 DIAGNOSIS — N3946 Mixed incontinence: Secondary | ICD-10-CM

## 2018-02-27 DIAGNOSIS — N3020 Other chronic cystitis without hematuria: Secondary | ICD-10-CM

## 2018-02-27 MED ORDER — TRIMETHOPRIM 100 MG PO TABS: 100.0000 mg | ORAL_TABLET | Freq: Every day | ORAL | 3 refills | Status: DC

## 2018-02-27 MED ORDER — TRIMETHOPRIM 100 MG PO TABS: 100.0000 mg | ORAL_TABLET | Freq: Every day | ORAL | 11 refills | Status: DC

## 2018-02-27 NOTE — Telephone Encounter (Signed)
Script resent

## 2018-02-27 NOTE — Telephone Encounter (Signed)
Pt called office, was seen today by Dr. Matilde Sprang asking that her Rx that was prescribed today be sent to [facility] instead of [facility].  Please advise.  Thanks.

## 2018-02-27 NOTE — Progress Notes (Signed)
02/27/2018 10:07 AM   Sarah Freeman 12-16-1930 [id number]  Referring provider: Kathrine Haddock, NP [address], [place] [address]  No chief complaint on file.   HPI:I was consulted to assess the patient's worsening urinary incontinence over many months.She leaks with coughing sneezing bending and lifting. She has urge incontinence and no bedwetting. She wears 1 pad a day. She will wear 2 pads if active. She soaks more urine if she has urgency.  She voids 4 or 5 times at night. She voids every 2 hours during the day.   She had a urine culture positive on October 05, 2017 Well supported bladder neck with no stress incontinence. She had no significant prolapse.   The patient was admitted with a urinary tract infection and positive blood culture. It was treated. Myrbetriq did not help. She was switched to Vesicare 5 mg.Renal ultrasound was never ordered  She still feels itchiness in the vaginal area and by history never had a Diflucan sent in. She no longer leaks in the daytime and I think is going less frequently but still goes every 2 hours at night  The patient will stay on Vesicare and currently it is $110. I am not going to order an ultrasound yet or put her on prophylaxis unless we get more cultures that are positive. Urine was sent for culture today for some vague intermittent dysuria. Diflucan for 3 days sent. Reassess in about 5 or 6 weeks on Vesicare. Reassess burning which could be due to vaginal dryness and nighttime frequency. She may be a good candidate for desmopressin but I did not want to add this on today  The patient's last urine culture was positive.  Clinically the patient has some nonspecific suprapubic discomfort today but probably is not infected.  I did send a urine for culture.  She really believes Vesicare was helping.  She thought it was causing side effects but she likely had sinusitis and wants to start it again  Today Patient had a  positive urine culture in January and February 2019.  Recent renal ultrasound was normal The patient was started on trimethoprim and was also given Vesicare.  Frequency stable   PMH: Past Medical History:  Diagnosis Date  . Hyperlipidemia   . Hypothyroid   . Stroke ([place])   . Urinary incontinence     Surgical History: Past Surgical History:  Procedure Laterality Date  . ABDOMINAL HYSTERECTOMY     age 40  . BREAST BIOPSY Left 2000?   neg. dr. Dwyane Luo office  . CHOLECYSTECTOMY  1964  . COLONOSCOPY     Dr Nicolasa Ducking  . DILATION AND CURETTAGE OF UTERUS    . EYE SURGERY    . LOOP RECORDER INSERTION N/A 02/09/2018   Procedure: LOOP RECORDER INSERTION;  Surgeon: Deboraha Sprang, MD;  Location: [facility];  Service: Cardiovascular;  Laterality: N/A;  . SKIN CANCER EXCISION     face  . TEE WITHOUT CARDIOVERSION N/A 02/09/2018   Procedure: TRANSESOPHAGEAL ECHOCARDIOGRAM (TEE);  Surgeon: Minna Merritts, MD;  Location: [facility];  Service: Cardiovascular;  Laterality: N/A;    Home Medications:  Allergies as of 02/27/2018      Reactions   Neosporin [neomycin-polymyxin-gramicidin] Other (See Comments)   Pt states wounds do not heal with neosporin   Statins Other (See Comments)   Leg cramps      Medication List        Accurate as of 02/27/18 10:07 AM. Always use your most  recent med list.          aspirin 81 MG EC tablet Take 1 tablet (81 mg total) by mouth daily.   clopidogrel 75 MG tablet Commonly known as:  PLAVIX Take 1 tablet (75 mg total) by mouth daily.   diphenhydramine-acetaminophen 25-500 MG Tabs tablet Commonly known as:  TYLENOL PM Take 1 tablet by mouth at bedtime as needed (for sleep).   levothyroxine 50 MCG tablet Commonly known as:  SYNTHROID, LEVOTHROID Take 1 tablet (50 mcg total) by mouth at bedtime.   pravastatin 10 MG tablet Commonly known as:  PRAVACHOL Take 1 tablet (10 mg total) by mouth daily at 6 PM.   RED YEAST RICE PO Take 1 capsule  by mouth daily.   TYLENOL ARTHRITIS PAIN 650 MG CR tablet Generic drug:  acetaminophen Take 650 mg by mouth every 8 (eight) hours as needed for pain.       Allergies:  Allergies  Allergen Reactions  . Neosporin [Neomycin-Polymyxin-Gramicidin] Other (See Comments)    Pt states wounds do not heal with neosporin  . Statins Other (See Comments)    Leg cramps    Family History: Family History  Problem Relation Age of Onset  . Cancer Father        prostate  . Stroke Father   . Hypertension Sister   . Hyperlipidemia Daughter   . Heart disease Son   . Diabetes Maternal Grandmother   . Heart disease Maternal Grandmother   . Heart disease Maternal Grandfather   . Heart disease Daughter   . CAD Mother   . Failure to thrive Mother   . Breast cancer Neg Hx     Social History:  reports that she has never smoked. She has never used smokeless tobacco. She reports that she does not drink alcohol or use drugs.  ROS:                                        Physical Exam: BP 119/71 (BP Location: Right Arm, Patient Position: Sitting, Cuff Size: Normal)   Pulse 86   Ht 5' (1.524 m)   Wt 84.4 kg (186 lb 1.6 oz)   BMI 36.35 kg/m   Constitutional:  Alert and oriented, No acute distress.   Laboratory Data: Lab Results  Component Value Date   WBC 7.5 01/19/2018   HGB 12.8 01/19/2018   HCT 37.9 01/19/2018   MCV 95.6 01/19/2018   PLT 285 01/19/2018    Lab Results  Component Value Date   CREATININE 0.92 01/19/2018    No results found for: PSA  No results found for: TESTOSTERONE  Lab Results  Component Value Date   HGBA1C 6.1 (H) 01/18/2018    Urinalysis    Component Value Date/Time   COLORURINE YELLOW (A) 01/18/2018 1925   APPEARANCEUR CLEAR (A) 01/18/2018 1925   APPEARANCEUR Cloudy (A) 01/02/2018 0839   LABSPEC 1.006 01/18/2018 1925   PHURINE 7.0 01/18/2018 1925   GLUCOSEU NEGATIVE 01/18/2018 1925   HGBUR NEGATIVE 01/18/2018 1925    BILIRUBINUR NEGATIVE 01/18/2018 1925   BILIRUBINUR Negative 01/02/2018 0839   KETONESUR NEGATIVE 01/18/2018 1925   PROTEINUR NEGATIVE 01/18/2018 1925   NITRITE NEGATIVE 01/18/2018 1925   LEUKOCYTESUR TRACE (A) 01/18/2018 1925   LEUKOCYTESUR 3+ (A) 01/02/2018 0839    Pertinent Imaging: Normal u/sound  Assessment & Plan: Clinically infection free.  Urge incontinence persisting.  She  would rather not go back on the Vesicare and deals with a quite well.  Overall it is improved on suppression therapy.  She had stopped it 3 weeks ago and was not certain about refills.  Prescription given with refills and I will see her in 1 year she just had a number of cardiac issues.  There are no diagnoses linked to this encounter.  No follow-ups on file.  Reece Packer, MD  [facility] [address] [place], [place] [address] [phone]

## 2018-03-06 ENCOUNTER — Ambulatory Visit (INDEPENDENT_AMBULATORY_CARE_PROVIDER_SITE_OTHER): Payer: Medicare Other

## 2018-03-06 VITALS — BP 124/64 | HR 62 | Temp 97.6°F | Resp 16 | Ht 59.0 in | Wt 185.7 lb

## 2018-03-06 DIAGNOSIS — Z0000 Encounter for general adult medical examination without abnormal findings: Secondary | ICD-10-CM | POA: Diagnosis not present

## 2018-03-06 NOTE — Progress Notes (Signed)
Subjective:   Sarah Freeman is a 84 y.o. female who presents for Medicare Annual (Subsequent) preventive examination.  Review of Systems:   Cardiac Risk Factors include: advanced age (>67men, >69 women);dyslipidemia;obesity (BMI >30kg/m2)     Objective:     Vitals: BP 124/64 (BP Location: Left Arm, Patient Position: Sitting)   Pulse 62   Temp 97.6 F (36.4 C) (Temporal)   Resp 16   Ht 4\' 11"  (1.499 m)   Wt 185 lb 11.2 oz (84.2 kg)   BMI 37.51 kg/m   Body mass index is 37.51 kg/m.  Advanced Directives 03/06/2018 02/09/2018 01/18/2018 01/18/2018 01/18/2018 11/04/2017 11/04/2017  Does Patient Have a Medical Advance Directive? Yes No No No No Yes Yes  Type of Advance Directive Living will - - - - Living will Sarah Freeman;Living will  Does patient want to make changes to medical advance directive? - - - - - No - Patient declined -  Copy of Sarah Freeman in Chart? - - - - - - No - copy requested  Would patient like information on creating a medical advance directive? - No - Patient declined - No - Patient declined - - -    Tobacco Social History   Tobacco Use  Smoking Status Never Smoker  Smokeless Tobacco Never Used     Counseling given: Not Answered   Clinical Intake:  Pre-visit preparation completed: Yes  Pain : No/denies pain     Nutritional Status: BMI > 30  Obese Nutritional Risks: None Diabetes: No  How often do you need to have someone help you when you read instructions, pamphlets, or other written materials from your doctor or pharmacy?: 1 - Never What is the last grade level you completed in school?: 12th grade  Interpreter Needed?: No  Information entered by :: Sarah Hill,LPN   Past Medical History:  Diagnosis Date  . Hyperlipidemia   . Hypothyroid   . Stroke ([place])   . Urinary incontinence    Past Surgical History:  Procedure Laterality Date  . ABDOMINAL HYSTERECTOMY     age 30  . BREAST BIOPSY Left 2000?   neg. dr. Dwyane Freeman office  . CHOLECYSTECTOMY  1964  . COLONOSCOPY     Dr Sarah Freeman  . DILATION AND CURETTAGE OF UTERUS    . EYE SURGERY    . LOOP RECORDER INSERTION N/A 02/09/2018   Procedure: LOOP RECORDER INSERTION;  Surgeon: Sarah Sprang, MD;  Location: [facility];  Service: Cardiovascular;  Laterality: N/A;  . SKIN CANCER EXCISION     face  . TEE WITHOUT CARDIOVERSION N/A 02/09/2018   Procedure: TRANSESOPHAGEAL ECHOCARDIOGRAM (TEE);  Surgeon: Sarah Merritts, MD;  Location: [facility];  Service: Cardiovascular;  Laterality: N/A;   Family History  Problem Relation Age of Onset  . Cancer Father        prostate  . Stroke Father   . Hypertension Sister   . Hyperlipidemia Daughter   . Heart disease Son   . Diabetes Maternal Grandmother   . Heart disease Maternal Grandmother   . Heart disease Maternal Grandfather   . Heart disease Daughter   . CAD Mother   . Failure to thrive Mother   . Breast cancer Neg Hx    Social History   Socioeconomic History  . Marital status: Widowed    Spouse name: Not on file  . Number of children: Not on file  . Years of education: Not on file  .  Highest education level: Not on file  Occupational History  . Not on file  Social Needs  . Financial resource strain: Not hard at all  . Food insecurity:    Worry: Never true    Inability: Never true  . Transportation needs:    Medical: No    Non-medical: No  Tobacco Use  . Smoking status: Never Smoker  . Smokeless tobacco: Never Used  Substance and Sexual Activity  . Alcohol use: No    Alcohol/week: 0.0 oz  . Drug use: No  . Sexual activity: Never  Lifestyle  . Physical activity:    Days per week: 0 days    Minutes per session: 0 min  . Stress: Not at all  Relationships  . Social connections:    Talks on phone: More than three times a week    Gets together: More than three times a week    Attends religious service: More than 4 times per year    Active member of club or  organization: No    Attends meetings of clubs or organizations: Never    Relationship status: Widowed  Other Topics Concern  . Not on file  Social History Narrative  . Not on file    Outpatient Encounter Medications as of 03/06/2018  Medication Sig  . acetaminophen (TYLENOL ARTHRITIS PAIN) 650 MG CR tablet Take 650 mg by mouth every 8 (eight) hours as needed for pain.  Marland Kitchen aspirin 81 MG EC tablet Take 1 tablet (81 mg total) by mouth daily.  . clopidogrel (PLAVIX) 75 MG tablet Take 1 tablet (75 mg total) by mouth daily.  . diphenhydramine-acetaminophen (TYLENOL PM) 25-500 MG TABS tablet Take 1 tablet by mouth at bedtime as needed (for sleep).   Marland Kitchen levothyroxine (SYNTHROID, LEVOTHROID) 50 MCG tablet Take 1 tablet (50 mcg total) by mouth at bedtime.  . pravastatin (PRAVACHOL) 10 MG tablet Take 1 tablet (10 mg total) by mouth daily at 6 PM.  . Red Yeast Rice Extract (RED YEAST RICE PO) Take 1 capsule by mouth daily.   Marland Kitchen trimethoprim (TRIMPEX) 100 MG tablet Take 1 tablet (100 mg total) by mouth daily.   No facility-administered encounter medications on file as of 03/06/2018.     Activities of Daily Living In your present state of health, do you have any difficulty performing the following activities: 03/06/2018 01/18/2018  Hearing? [place]? Y -  Difficulty concentrating or making decisions? Y -  Walking or climbing stairs? N -  Dressing or bathing? N -  Doing errands, shopping? N N  Preparing Food and eating ? N -  Using the Toilet? N -  In the past six months, have you accidently leaked urine? Y -  Comment wears protection  -  Do you have problems with loss of bowel control? N -  Managing your Medications? N -  Managing your Finances? N -  Housekeeping or managing your Housekeeping? N -  Some recent data might be hidden    Patient Care Team: Sarah Haddock, NP as PCP - General (Nurse Practitioner) Sarah Bellow, MD (General Surgery) Sarah Roys, DO as Referring Physician  (Family Medicine)    Assessment:   This is a routine wellness examination for Sarah Freeman.  Exercise Activities and Dietary recommendations Current Exercise Habits: Home exercise routine, Type of exercise: treadmill, Time (Minutes): 30, Frequency (Times/Week): 4, Weekly Exercise (Minutes/Week): 120, Intensity: Mild, Exercise limited by: None identified  Goals    . DIET - INCREASE WATER  INTAKE     Recommend drinking at least 6-8 glasses of water a day        Fall Risk Fall Risk  03/06/2018 02/22/2017 03/30/2016  Falls in the past year? No Yes No  Number falls in past yr: - 1 -  Injury with Fall? - No -   Is the patient's home free of loose throw rugs in walkways, pet beds, electrical cords, etc?   yes      Grab bars in the bathroom? yes      Handrails on the stairs?   yes      Adequate lighting?   yes  Timed Get Up and Go performed: Completed in 9 seconds with no use of assistive devices, steady gait. No intervention needed at this time.   Depression Screen PHQ 2/9 Scores 03/06/2018 02/22/2017 03/30/2016  PHQ - 2 Score 0 0 0  PHQ- 9 Score - 1 -     Cognitive Function     6CIT Screen 03/06/2018  What Year? 0 points  What month? 0 points  What time? 0 points  Count back from 20 0 points  Months in reverse 0 points  Repeat phrase 0 points  Total Score 0    Immunization History  Administered Date(s) Administered  . Influenza, High Dose Seasonal PF 10/05/2017  . Influenza-Unspecified 10/06/2016  . Pneumococcal Conjugate-13 10/06/2016  . Pneumococcal Polysaccharide-23 07/22/2004, 10/05/2017  . Td 01/20/2011  . Tdap 05/19/2017  . Zoster 01/09/2008  . Zoster Recombinat (Shingrix) 03/18/2017, 05/19/2017    Qualifies for Shingles Vaccine? Completed   Screening Tests Health Maintenance  Topic Date Due  . INFLUENZA VACCINE  06/29/2018  . TETANUS/TDAP  05/20/2027  . DEXA SCAN  Completed  . PNA vac Low Risk Adult  Completed    Cancer Screenings: Lung: Low Dose CT Chest  recommended if Age 28-80 years, 30 pack-year currently smoking OR have quit w/in 15years. Patient does not qualify. Breast:  Up to date on Mammogram? Yes   Up to date of Bone Density/Dexa? Yes Colorectal: no longer required  Additional Screenings: Hepatitis C Screening: not indicated      Plan:    I have personally reviewed and addressed the Medicare Annual Wellness questionnaire and have noted the following in the patient's chart:  A. Medical and social history B. Use of alcohol, tobacco or illicit drugs  C. Current medications and supplements D. Functional ability and status E.  Nutritional status F.  Physical activity G. Advance directives H. List of other physicians I.  Hospitalizations, surgeries, and ER visits in previous 12 months J.  [place] such as hearing and vision if needed, cognitive and depression L. Referrals and appointments   In addition, I have reviewed and discussed with patient certain preventive protocols, quality metrics, and best practice recommendations. A written personalized care plan for preventive services as well as general preventive health recommendations were provided to patient.   Signed,  Tyler Aas, LPN Nurse Health Advisor   Nurse Notes: none

## 2018-03-06 NOTE — Patient Instructions (Signed)
Sarah Freeman , Thank you for taking time to come for your Medicare Wellness Visit. I appreciate your ongoing commitment to your health goals. Please review the following plan we discussed and let me know if I can assist you in the future.   Screening recommendations/referrals: Colonoscopy: no longer required Mammogram: no longer required Bone Density: completed, no longer required Recommended yearly ophthalmology/optometry visit for glaucoma screening and checkup Recommended yearly dental visit for hygiene and checkup  Vaccinations: Influenza vaccine: up to date Pneumococcal vaccine: up to date Tdap vaccine: up to date Shingles vaccine: up to date     Advanced directives: Please bring a copy of your health care power of attorney and living will to the office at your convenience.  Conditions/risks identified: Recommend drinking at least 6-8 glasses of water a day   Next appointment: Follow up on 10/09/2018 at 8:00am with Regino Schultze. Follow up in one year for your annual wellness exam.    Preventive Care 65 Years and Older, Female Preventive care refers to lifestyle choices and visits with your health care provider that can promote health and wellness. What does preventive care include?  A yearly physical exam. This is also called an annual well check.  Dental exams once or twice a year.  Routine eye exams. Ask your health care provider how often you should have your eyes checked.  Personal lifestyle choices, including:  Daily care of your teeth and gums.  Regular physical activity.  Eating a healthy diet.  Avoiding tobacco and drug use.  Limiting alcohol use.  Practicing safe sex.  Taking low-dose aspirin every day.  Taking vitamin and mineral supplements as recommended by your health care provider. What happens during an annual well check? The services and screenings done by your health care provider during your annual well check will depend on your age,  overall health, lifestyle risk factors, and family history of disease (82). Counseling  Your health care provider may ask you questions about your:  Alcohol use.  Tobacco use.  Drug use.  Emotional well-being.  Home and relationship well-being.  Sexual activity.  Eating habits.  History of falls.  Memory and ability to understand (cognition).  Work and work Statistician.  Reproductive health. Screening  You may have the following tests or measurements:  Height, weight, and BMI.  Blood pressure.  Lipid and cholesterol levels. These may be checked every 5 years, or more frequently if you are over 82 years old.  Skin check.  Lung cancer screening. You may have this screening every year starting at age 45 if you have a 30-pack-year history of smoking and currently smoke or have quit within the past 15 years.  Fecal occult blood test (FOBT) of the stool. You may have this test every year starting at age 82.  Flexible sigmoidoscopy or colonoscopy. You may have a sigmoidoscopy every 5 years or a colonoscopy every 10 years starting at age 82.  Hepatitis C blood test.  Hepatitis B blood test.  Sexually transmitted disease (STD) testing.  Diabetes screening. This is done by checking your blood sugar (glucose) after you have not eaten for a while (fasting). You may have this done every 1-3 years.  Bone density scan. This is done to screen for osteoporosis. You may have this done starting at age 60.  Mammogram. This may be done every 1-2 years. Talk to your health care provider about how often you should have regular mammograms. Talk with your health care provider about your test results, treatment  options, and if necessary, the need for more tests. Vaccines  Your health care provider may recommend certain vaccines, such as:  Influenza vaccine. This is recommended every year.  Tetanus, diphtheria, and acellular pertussis (Tdap, Td) vaccine. You may need a Td booster every 10  years.  Zoster vaccine. You may need this after age 61.  Pneumococcal 13-valent conjugate (PCV13) vaccine. One dose is recommended after age 54.  Pneumococcal polysaccharide (PPSV23) vaccine. One dose is recommended after age 1. Talk to your health care provider about which screenings and vaccines you need and how often you need them. This information is not intended to replace advice given to you by your health care provider. Make sure you discuss any questions you have with your health care provider. Document Released: 12/12/2015 Document Revised: 08/04/2016 Document Reviewed: 09/16/2015 Elsevier Interactive Patient Education  2017 [employer] Prevention in the Home Falls can cause injuries. They can happen to people of all ages. There are many things you can do to make your home safe and to help prevent falls. What can I do on the outside of my home?  Regularly fix the edges of walkways and driveways and fix any cracks.  Remove anything that might make you trip as you walk through a door, such as a raised step or threshold.  Trim any bushes or trees on the path to your home.  Use bright outdoor lighting.  Clear any walking paths of anything that might make someone trip, such as rocks or tools.  Regularly check to see if handrails are loose or broken. Make sure that both sides of any steps have handrails.  Any raised decks and porches should have guardrails on the edges.  Have any leaves, snow, or ice cleared regularly.  Use sand or salt on walking paths during winter.  Clean up any spills in your garage right away. This includes oil or grease spills. What can I do in the bathroom?  Use night lights.  Install grab bars by the toilet and in the tub and shower. Do not use towel bars as grab bars.  Use non-skid mats or decals in the tub or shower.  If you need to sit down in the shower, use a plastic, non-slip stool.  Keep the floor dry. Clean up any water that  spills on the floor as soon as it happens.  Remove soap buildup in the tub or shower regularly.  Attach bath mats securely with double-sided non-slip rug tape.  Do not have throw rugs and other things on the floor that can make you trip. What can I do in the bedroom?  Use night lights.  Make sure that you have a light by your bed that is easy to reach.  Do not use any sheets or blankets that are too big for your bed. They should not hang down onto the floor.  Have a firm chair that has side arms. You can use this for support while you get dressed.  Do not have throw rugs and other things on the floor that can make you trip. What can I do in the kitchen?  Clean up any spills right away.  Avoid walking on wet floors.  Keep items that you use a lot in easy-to-reach places.  If you need to reach something above you, use a strong step stool that has a grab bar.  Keep electrical cords out of the way.  Do not use floor polish or wax that makes floors slippery.  If you must use wax, use non-skid floor wax.  Do not have throw rugs and other things on the floor that can make you trip. What can I do with my stairs?  Do not leave any items on the stairs.  Make sure that there are handrails on both sides of the stairs and use them. Fix handrails that are broken or loose. Make sure that handrails are as long as the stairways.  Check any carpeting to make sure that it is firmly attached to the stairs. Fix any carpet that is loose or worn.  Avoid having throw rugs at the top or bottom of the stairs. If you do have throw rugs, attach them to the floor with carpet tape.  Make sure that you have a light switch at the top of the stairs and the bottom of the stairs. If you do not have them, ask someone to add them for you. What else can I do to help prevent falls?  Wear shoes that:  Do not have high heels.  Have rubber bottoms.  Are comfortable and fit you well.  Are closed at the  toe. Do not wear sandals.  If you use a stepladder:  Make sure that it is fully opened. Do not climb a closed stepladder.  Make sure that both sides of the stepladder are locked into place.  Ask someone to hold it for you, if possible.  Clearly mark and make sure that you can see:  Any grab bars or handrails.  First and last steps.  Where the edge of each step is.  Use tools that help you move around (mobility aids) if they are needed. These include:  Canes.  Walkers.  Scooters.  Crutches.  Turn on the lights when you go into a dark area. Replace any light bulbs as soon as they burn out.  Set up your furniture so you have a clear path. Avoid moving your furniture around.  If any of your floors are uneven, fix them.  If there are any pets around you, be aware of where they are.  Review your medicines with your doctor. Some medicines can make you feel dizzy. This can increase your chance of falling. Ask your doctor what other things that you can do to help prevent falls. This information is not intended to replace advice given to you by your health care provider. Make sure you discuss any questions you have with your health care provider. Document Released: 09/11/2009 Document Revised: 04/22/2016 Document Reviewed: 12/20/2014 Elsevier Interactive Patient Education  2017 [employer].

## 2018-03-13 ENCOUNTER — Ambulatory Visit (INDEPENDENT_AMBULATORY_CARE_PROVIDER_SITE_OTHER): Payer: Medicare Other | Admitting: *Deleted

## 2018-03-13 DIAGNOSIS — I639 Cerebral infarction, unspecified: Secondary | ICD-10-CM

## 2018-03-14 NOTE — Progress Notes (Signed)
Carelink Summary Report / Loop Recorder 

## 2018-03-20 ENCOUNTER — Telehealth: Payer: Self-pay | Admitting: Unknown Physician Specialty

## 2018-03-20 NOTE — Telephone Encounter (Signed)
Copied from [place] [id number]. Topic: Quick Communication - Rx Refill/Question >> Mar 20, 2018  1:58 PM Robina Ade, Helene Kelp D wrote: Medication: levothyroxine (SYNTHROID, LEVOTHROID) 50 MCG tablet for 90 day supply, esomeprazole (NEXIUM) 20 MG capsule 90 day supply,clopidogrel (PLAVIX) 75 MG tablet 90 day supply. Has the patient contacted their pharmacy? Yes  (Agent: If no, request that the patient contact the pharmace Pharmacy: [facility], [place]  Agent: Please be advised that RX refills may take up to 3 business days. We ask that you follow-up with your pharmacy.

## 2018-03-21 ENCOUNTER — Other Ambulatory Visit: Payer: Self-pay | Admitting: *Deleted

## 2018-03-21 MED ORDER — ESOMEPRAZOLE MAGNESIUM 40 MG PO CPDR: 40.0000 mg | DELAYED_RELEASE_CAPSULE | Freq: Every day | ORAL | 3 refills | Status: DC

## 2018-03-21 MED ORDER — LEVOTHYROXINE SODIUM 50 MCG PO TABS: 50.0000 ug | ORAL_TABLET | Freq: Every day | ORAL | 1 refills | Status: DC

## 2018-03-21 MED ORDER — CLOPIDOGREL BISULFATE 75 MG PO TABS: 75.0000 mg | ORAL_TABLET | Freq: Every day | ORAL | 1 refills | Status: DC

## 2018-03-21 NOTE — Telephone Encounter (Signed)
Rx for Synthroid and Plavix refilled per protocol.  Nexium not on patient medication list- sent for provider review.  LOV: 01/25/18  PCP: [facility]: verified

## 2018-04-13 ENCOUNTER — Ambulatory Visit (INDEPENDENT_AMBULATORY_CARE_PROVIDER_SITE_OTHER): Payer: Medicare Other | Admitting: *Deleted

## 2018-04-13 DIAGNOSIS — I639 Cerebral infarction, unspecified: Secondary | ICD-10-CM

## 2018-04-13 NOTE — Progress Notes (Signed)
Carelink Summary Report / Loop Recorder 

## 2018-04-17 LAB — CUP PACEART REMOTE DEVICE CHECK
Date Time Interrogation Session: 20190413123529
MDC IDC PG IMPLANT DT: 20190314

## 2018-05-01 ENCOUNTER — Telehealth: Payer: Self-pay | Admitting: Unknown Physician Specialty

## 2018-05-01 NOTE — Telephone Encounter (Signed)
Copied from [place] [id number]. Topic: Quick Communication - Rx Refill/Question >> May 01, 2018 11:22 AM Marin Olp L wrote: Medication: pravastatin (PRAVACHOL) 10 MG tablet  Has the patient contacted their pharmacy? Yes.   (Agent: If no, request that the patient contact the pharmacy for the refill.) (Agent: If yes, when and what did the pharmacy advise?)  Preferred Pharmacy (with phone number or street name): [facility], [place] [address] [place] [address] Phone: [phone] Fax: [phone]  Agent: Please be advised that RX refills may take up to 3 business days. We ask that you follow-up with your pharmacy.

## 2018-05-02 MED ORDER — PRAVASTATIN SODIUM 10 MG PO TABS: 10.0000 mg | ORAL_TABLET | Freq: Every day | ORAL | 1 refills | Status: DC

## 2018-05-02 NOTE — Addendum Note (Signed)
Addended by: Matilde Sprang on: 05/02/2018 01:53 PM   Modules accepted: Orders

## 2018-05-05 LAB — CUP PACEART REMOTE DEVICE CHECK
Implantable Pulse Generator Implant Date: 20190314
MDC IDC SESS DTM: 20190516133911

## 2018-05-16 ENCOUNTER — Ambulatory Visit (INDEPENDENT_AMBULATORY_CARE_PROVIDER_SITE_OTHER): Payer: Medicare Other | Admitting: *Deleted

## 2018-05-16 DIAGNOSIS — I639 Cerebral infarction, unspecified: Secondary | ICD-10-CM

## 2018-05-17 NOTE — Progress Notes (Signed)
Carelink Summary Report / Loop Recorder 

## 2018-05-24 ENCOUNTER — Telehealth: Payer: Self-pay | Admitting: *Deleted

## 2018-05-24 ENCOUNTER — Ambulatory Visit (INDEPENDENT_AMBULATORY_CARE_PROVIDER_SITE_OTHER): Payer: Medicare Other | Admitting: Unknown Physician Specialty

## 2018-05-24 ENCOUNTER — Encounter: Payer: Self-pay | Admitting: Unknown Physician Specialty

## 2018-05-24 VITALS — BP 124/78 | HR 68 | Temp 98.4°F | Ht 60.0 in | Wt 184.6 lb

## 2018-05-24 DIAGNOSIS — R059 Cough, unspecified: Secondary | ICD-10-CM

## 2018-05-24 DIAGNOSIS — E7800 Pure hypercholesterolemia, unspecified: Secondary | ICD-10-CM

## 2018-05-24 DIAGNOSIS — R05 Cough: Secondary | ICD-10-CM | POA: Diagnosis not present

## 2018-05-24 DIAGNOSIS — N183 Chronic kidney disease, stage 3 unspecified: Secondary | ICD-10-CM | POA: Insufficient documentation

## 2018-05-24 DIAGNOSIS — N393 Stress incontinence (female) (male): Secondary | ICD-10-CM | POA: Diagnosis not present

## 2018-05-24 MED ORDER — EZETIMIBE 10 MG PO TABS: 10.0000 mg | ORAL_TABLET | Freq: Every day | ORAL | 3 refills | Status: DC

## 2018-05-24 MED ORDER — ALIROCUMAB 75 MG/ML ~~LOC~~ SOPN: 75.0000 mg | PEN_INJECTOR | SUBCUTANEOUS | 12 refills | Status: DC

## 2018-05-24 NOTE — Addendum Note (Signed)
Addended by: Kathrine Haddock on: 05/24/2018 01:36 PM   Modules accepted: Orders

## 2018-05-24 NOTE — Progress Notes (Signed)
BP 124/78   Pulse 68   Temp 98.4 F (36.9 C) (Oral)   Ht 5' (1.524 m)   Wt 184 lb 9.6 oz (83.7 kg)   SpO2 98%   BMI 36.05 kg/m    Subjective:    Patient ID: Sarah Freeman, female    DOB: 05-Sep-1931, 84 y.o.   MRN: [id number]  HPI: Sarah Freeman is a 84 y.o. female  Chief Complaint  Patient presents with  . URI    pt states she has had a cough and congestion for the last 3 weeks, states it is getting a little better though   Cough  This is a new problem. Episode onset: 3 weeks. The problem has been gradually improving. The problem occurs constantly. The cough is non-productive. Associated symptoms include nasal congestion and rhinorrhea. Pertinent negatives include no chest pain, chills, ear congestion, ear pain, fever, headaches, heartburn, hemoptysis, myalgias, postnasal drip, rash, sore throat, shortness of breath, sweats, weight loss or wheezing.   Stress incontinence Pt having frequency, and seen Urology. Does well on antibiotic suppression therapy.  Not wanting Vesicare or related medications.      Hypercholesterol Pt not taking statin due to intolerance.  Most recently tried Pravastatin but stopped due to muscle aches in legs.     Relevant past medical, surgical, family and social history reviewed and updated as indicated. Interim medical history since our last visit reviewed. Allergies and medications reviewed and updated.  Review of Systems  Constitutional: Negative for chills, fever and weight loss.  HENT: Positive for rhinorrhea. Negative for ear pain, postnasal drip and sore throat.   Respiratory: Positive for cough. Negative for hemoptysis, shortness of breath and wheezing.   Cardiovascular: Negative for chest pain.  Gastrointestinal: Negative for heartburn.  Musculoskeletal: Negative for myalgias.  Skin: Negative for rash.  Neurological: Negative for headaches.    Per HPI unless specifically indicated above     Objective:    BP 124/78   Pulse  68   Temp 98.4 F (36.9 C) (Oral)   Ht 5' (1.524 m)   Wt 184 lb 9.6 oz (83.7 kg)   SpO2 98%   BMI 36.05 kg/m   Wt Readings from Last 3 Encounters:  05/24/18 184 lb 9.6 oz (83.7 kg)  03/06/18 185 lb 11.2 oz (84.2 kg)  02/27/18 186 lb 1.6 oz (84.4 kg)    Physical Exam  Constitutional: She is oriented to person, place, and time. She appears well-developed and well-nourished. No distress.  HENT:  Head: Normocephalic and atraumatic.  Eyes: Conjunctivae and lids are normal. Right eye exhibits no discharge. Left eye exhibits no discharge. No scleral icterus.  Neck: Normal range of motion. Neck supple. No JVD present. Carotid bruit is not present.  Cardiovascular: Normal rate, regular rhythm and normal heart sounds.  Pulmonary/Chest: Effort normal and breath sounds normal.  Abdominal: Normal appearance. There is no splenomegaly or hepatomegaly.  Musculoskeletal: Normal range of motion.  Neurological: She is alert and oriented to person, place, and time.  Skin: Skin is warm, dry and intact. No rash noted. No pallor.  Psychiatric: She has a normal mood and affect. Her behavior is normal. Judgment and thought content normal.    Results for orders placed or performed in visit on 04/13/18  CUP PACEART REMOTE DEVICE CHECK  Result Value Ref Range   Date Time Interrogation Session [id number]    Pulse Generator Manufacturer [employer]    Pulse Gen Model [id number] Reveal LINQ    Pulse  Gen Serial Number [id number] [facility]    Implantable Pulse Generator Type ICM/ILR    Implantable Pulse Generator Implant Date [id number]    Eval Rhythm SR       Assessment & Plan:   Problem List Items Addressed This Visit      Unprioritized   Chronic kidney disease, stage 3 (HCC)    Last GFR 44.  Not on ACE but BP may not tolerate.  Sees Urology but not Nephrology.  Consider referral based on GFR today      Relevant Orders   CBC with Differential/Platelet   Comprehensive metabolic  panel   VITAMIN D 25 Hydroxy (Vit-D Deficiency, Fractures)   PTH, Intact and Calcium   Hypercholesterolemia    Intolerant to statins.  Explained that though cholesterol not high, statins will reduce stroke risk. Will start Zetia due to stroke reduction data.  Will try to get approval for a PSK 9.   Recheck in November      Relevant Medications   ezetimibe (ZETIA) 10 MG tablet   Other Relevant Orders   Lipid Panel w/o Chol/HDL Ratio   Stress incontinence    On suppressive therapy.         Other Visit Diagnoses    Cough    -  Primary   Due to viral illness.  Cough is improving.  Encouraged continued supportive care.         Follow up plan: Return in about 3 months (around 08/24/2018).

## 2018-05-24 NOTE — Assessment & Plan Note (Addendum)
On suppressive therapy.

## 2018-05-24 NOTE — Assessment & Plan Note (Addendum)
Intolerant to statins.  Explained that though cholesterol not high, statins will reduce stroke risk. Will start Zetia due to stroke reduction data.  Will try to get approval for a PSK 9.   Recheck in November

## 2018-05-24 NOTE — Telephone Encounter (Addendum)
Spoke with patient regarding tachy episode 05/23/18 at 11:00, duration 7 seconds, Max V rate 176 bpm. Patient states she was asymptomatic, she was working out in the yard with her grandsons. Advised patient would place in [facility] folder for review. Patient verbalized understanding.

## 2018-05-24 NOTE — Assessment & Plan Note (Signed)
Last GFR 44.  Not on ACE but BP may not tolerate.  Sees Urology but not Nephrology.  Consider referral based on GFR today

## 2018-05-25 LAB — CBC WITH DIFFERENTIAL/PLATELET
BASOS ABS: 0.1 10*3/uL (ref 0.0–0.2)
BASOS: 1 %
EOS (ABSOLUTE): 0.4 10*3/uL (ref 0.0–0.4)
Eos: 5 %
HEMOGLOBIN: 12.3 g/dL (ref 11.1–15.9)
Hematocrit: 37.1 % (ref 34.0–46.6)
Immature Grans (Abs): 0 10*3/uL (ref 0.0–0.1)
Immature Granulocytes: 0 %
LYMPHS ABS: 2.9 10*3/uL (ref 0.7–3.1)
Lymphs: 30 %
MCH: 32.3 pg (ref 26.6–33.0)
MCHC: 33.2 g/dL (ref 31.5–35.7)
MCV: 97 fL (ref 79–97)
MONOCYTES: 10 %
Monocytes Absolute: 0.9 10*3/uL (ref 0.1–0.9)
NEUTROS ABS: 5.1 10*3/uL (ref 1.4–7.0)
Neutrophils: 54 %
Platelets: 288 10*3/uL (ref 150–450)
RBC: 3.81 x10E6/uL (ref 3.77–5.28)
RDW: 12.5 % (ref 12.3–15.4)
WBC: 9.5 10*3/uL (ref 3.4–10.8)

## 2018-05-25 LAB — LIPID PANEL W/O CHOL/HDL RATIO
CHOLESTEROL TOTAL: 158 mg/dL (ref 100–199)
HDL: 45 mg/dL (ref >39)
LDL Calculated: 86 mg/dL (ref 0–99)
Triglycerides: 133 mg/dL (ref 0–149)
VLDL Cholesterol Cal: 27 mg/dL (ref 5–40)

## 2018-05-25 LAB — COMPREHENSIVE METABOLIC PANEL
A/G RATIO: 1.4 (ref 1.2–2.2)
ALBUMIN: 3.7 g/dL (ref 3.5–4.7)
ALK PHOS: 49 IU/L (ref 39–117)
ALT: 14 IU/L (ref 0–32)
AST: 19 IU/L (ref 0–40)
BILIRUBIN TOTAL: 0.4 mg/dL (ref 0.0–1.2)
BUN / CREAT RATIO: 11 — AB (ref 12–28)
BUN: 12 mg/dL (ref 8–27)
CHLORIDE: 105 mmol/L (ref 96–106)
CO2: 23 mmol/L (ref 20–29)
Calcium: 8.9 mg/dL (ref 8.7–10.3)
Creatinine, Ser: 1.07 mg/dL — ABNORMAL HIGH (ref 0.57–1.00)
GFR calc non Af Amer: 47 mL/min/{1.73_m2} — ABNORMAL LOW (ref >59)
GFR, EST AFRICAN AMERICAN: 55 mL/min/{1.73_m2} — AB (ref >59)
GLUCOSE: 101 mg/dL — AB (ref 65–99)
Globulin, Total: 2.6 g/dL (ref 1.5–4.5)
POTASSIUM: 4.6 mmol/L (ref 3.5–5.2)
SODIUM: 139 mmol/L (ref 134–144)
Total Protein: 6.3 g/dL (ref 6.0–8.5)

## 2018-05-25 LAB — PTH, INTACT AND CALCIUM: PTH: 37 pg/mL (ref 15–65)

## 2018-05-25 LAB — VITAMIN D 25 HYDROXY (VIT D DEFICIENCY, FRACTURES): Vit D, 25-Hydroxy: 30.8 ng/mL (ref 30.0–100.0)

## 2018-05-29 ENCOUNTER — Telehealth: Payer: Self-pay | Admitting: Unknown Physician Specialty

## 2018-05-29 NOTE — Telephone Encounter (Signed)
Patient picked up a copy of her last labs

## 2018-05-30 ENCOUNTER — Telehealth: Payer: Self-pay

## 2018-05-30 NOTE — Telephone Encounter (Signed)
OK.  Just let her know if was for the cholesterol med as an alternative to statins.  I started her on Zetia, it has show to decrease stroke incidence

## 2018-05-30 NOTE — Telephone Encounter (Signed)
The prior authorization was already done and denied.

## 2018-05-30 NOTE — Telephone Encounter (Signed)
This is for the [place].  We can try filling out the prior auth

## 2018-05-30 NOTE — Telephone Encounter (Signed)
Copied from [place] [id number]. Topic: General - Other >> May 26, 2018  3:21 PM [name] wrote: Reason for CRM: Patient called to speak with doctor or nurse stating that she got a call from the pharmacy stating that she was not approved for a medication.  Patient did not know what medication or which pharmacy was calling.  Patient stated that the doctor would know which medication.  She was a little confused.  Please advise.  CB# [phone].  >> May 26, 2018  3:36 PM [name] wrote: Do you have any idea what medication the pharmacy may be calling about?   Praluent was not covered by the patient's insurance and the PA that was submitted for the medication was denied. [name], would you like to change the medication or how do you want to go from here?

## 2018-05-30 NOTE — Telephone Encounter (Signed)
Patient notified

## 2018-06-19 ENCOUNTER — Ambulatory Visit (INDEPENDENT_AMBULATORY_CARE_PROVIDER_SITE_OTHER): Payer: Medicare Other | Admitting: *Deleted

## 2018-06-19 DIAGNOSIS — I639 Cerebral infarction, unspecified: Secondary | ICD-10-CM | POA: Diagnosis not present

## 2018-06-19 NOTE — Progress Notes (Signed)
Carelink Summary Report / Loop Recorder 

## 2018-06-21 LAB — CUP PACEART REMOTE DEVICE CHECK
Implantable Pulse Generator Implant Date: 20190314
MDC IDC SESS DTM: 20190618133947

## 2018-06-23 ENCOUNTER — Telehealth: Payer: Self-pay | Admitting: *Deleted

## 2018-06-23 NOTE — Telephone Encounter (Signed)
Spoke with patient regarding sending manual transmission. Transmission received. 2 tachy episodes appear ST/SVT, Avg V rate 154 bpm. Reviewed episodes with patient and patient states she mispoke earlier, she was actually walking out to her car at this time yesterday 06/22/18 at 13:13-13:14. Advised patient will place episodes in Dr. Olin Pia folder for review and we will call back if he gives any further recommendations. Marland Kitchen

## 2018-06-23 NOTE — Telephone Encounter (Signed)
Spoke with patient regarding 2 tachy episodes. Patient states she was asymptomatic and just sitting in her recliner reading a book at that time. Will place all episodes in Dr. Aquilla Hacker folder for review once manual transmission is received.   Advised patient to send a manual transmission to review full report. She states she is outside cleaning her pool at this time but she will call us back this afternoon for assistance on sending a manual transmission. She states she will call the front desk and I advised her to ask for the device Clinic. Patient verbalized understanding.

## 2018-06-26 ENCOUNTER — Encounter: Payer: Self-pay | Admitting: Urology

## 2018-06-26 ENCOUNTER — Ambulatory Visit (INDEPENDENT_AMBULATORY_CARE_PROVIDER_SITE_OTHER): Payer: Medicare Other | Admitting: Urology

## 2018-06-26 VITALS — BP 119/74 | HR 79 | Ht 60.0 in | Wt 186.4 lb

## 2018-06-26 DIAGNOSIS — N3946 Mixed incontinence: Secondary | ICD-10-CM | POA: Diagnosis not present

## 2018-06-26 LAB — MICROSCOPIC EXAMINATION: RBC MICROSCOPIC, UA: NONE SEEN /HPF (ref 0–2)

## 2018-06-26 LAB — URINALYSIS, COMPLETE
BILIRUBIN UA: NEGATIVE
Glucose, UA: NEGATIVE
KETONES UA: NEGATIVE
Nitrite, UA: NEGATIVE
Protein, UA: NEGATIVE
RBC UA: NEGATIVE
SPEC GRAV UA: 1.015 (ref 1.005–1.030)
UUROB: 0.2 mg/dL (ref 0.2–1.0)
pH, UA: 5.5 (ref 5.0–7.5)

## 2018-06-26 MED ORDER — TRIMETHOPRIM 100 MG PO TABS: 100.0000 mg | ORAL_TABLET | Freq: Every day | ORAL | 3 refills | Status: DC

## 2018-06-26 NOTE — Progress Notes (Signed)
06/26/2018 2:23 PM   Marcille Anson [place] 02-Mar-1931 [id number]  Referring provider: Kathrine Haddock, NP [address], [place] [address]  Chief Complaint  Patient presents with  . Urinary Incontinence    HPI: I was consulted to assess the patient's worsening urinary incontinence over many months.She leaks with coughing sneezing bending and lifting. She has urge incontinence and no bedwetting. She wears 1 pad a day. She will wear 2 pads if active. She soaks more urine if she has urgency.  She voids 4 or 5 times at night. She voids every 2 hours during the day.   She had a urine culture positive on October 05, 2017 Well supported bladder neck with no stress incontinence. She had no significant prolapse.   The patient was admitted with a urinary tract infection and positive blood culture. It was treated. Myrbetriq did not help. She was switched to Vesicare 5 mg.  The patient will stay on Vesicare and currently it is $110. I am not going to order an ultrasound yet or put her on prophylaxis unless we get more cultures that are positive.  Clinically the patient has some nonspecific suprapubic discomfort today but probably is not infected. I did send a urine for culture. She really believes Vesicare was helping. She thought it was causing side effects but she likely had sinusitis and wants to start it again  Patient had a positive urine culture in January and February 2019.  Recent renal ultrasound was normal The patient was started on trimethoprim and was also given Vesicare.  Today When I saw her last time she was infection free and did not want to go back on Vesicare for persisting urge incontinence.  Overall it was improved.  He has been having cardiac issues  Infection free on trimethoprim.  Incontinence stable and were not treating  PMH: Past Medical History:  Diagnosis Date  . Hyperlipidemia   . Hypothyroid   . Stroke ([place])   . Urinary incontinence      Surgical History: Past Surgical History:  Procedure Laterality Date  . ABDOMINAL HYSTERECTOMY     age 84  . BREAST BIOPSY Left 2000?   neg. dr. Dwyane Luo office  . CHOLECYSTECTOMY  1964  . COLONOSCOPY     Dr Nicolasa Ducking  . DILATION AND CURETTAGE OF UTERUS    . EYE SURGERY    . LOOP RECORDER INSERTION N/A 02/09/2018   Procedure: LOOP RECORDER INSERTION;  Surgeon: Deboraha Sprang, MD;  Location: [facility];  Service: Cardiovascular;  Laterality: N/A;  . SKIN CANCER EXCISION     face  . TEE WITHOUT CARDIOVERSION N/A 02/09/2018   Procedure: TRANSESOPHAGEAL ECHOCARDIOGRAM (TEE);  Surgeon: Minna Merritts, MD;  Location: [facility];  Service: Cardiovascular;  Laterality: N/A;    Home Medications:  Allergies as of 06/26/2018      Reactions   Neosporin [neomycin-polymyxin-gramicidin] Other (See Comments)   Pt states wounds do not heal with neosporin   Statins Other (See Comments)   Leg cramps      Medication List        Accurate as of 06/26/18  2:23 PM. Always use your most recent med list.          aspirin 81 MG EC tablet Take 1 tablet (81 mg total) by mouth daily.   clopidogrel 75 MG tablet Commonly known as:  PLAVIX Take 1 tablet (75 mg total) by mouth daily.   diphenhydramine-acetaminophen 25-500 MG Tabs tablet Commonly known as:  TYLENOL  PM Take 1 tablet by mouth at bedtime as needed (for sleep).   esomeprazole 40 MG capsule Commonly known as:  NEXIUM Take 1 capsule (40 mg total) by mouth daily.   HYDROcodone-acetaminophen 5-325 MG tablet Commonly known as:  NORCO/VICODIN Take 1 tablet by mouth every 8 (eight) hours as needed for moderate pain.   levothyroxine 50 MCG tablet Commonly known as:  SYNTHROID, LEVOTHROID Take 1 tablet (50 mcg total) by mouth at bedtime.   RED YEAST RICE PO Take 1 capsule by mouth daily.   trimethoprim 100 MG tablet Commonly known as:  TRIMPEX Take 1 tablet (100 mg total) by mouth daily.   TYLENOL ARTHRITIS PAIN 650 MG  CR tablet Generic drug:  acetaminophen Take 650 mg by mouth every 8 (eight) hours as needed for pain.       Allergies:  Allergies  Allergen Reactions  . Neosporin [Neomycin-Polymyxin-Gramicidin] Other (See Comments)    Pt states wounds do not heal with neosporin  . Statins Other (See Comments)    Leg cramps    Family History: Family History  Problem Relation Age of Onset  . Cancer Father        prostate  . Stroke Father   . Hypertension Sister   . Hyperlipidemia Daughter   . Heart disease Son   . Diabetes Maternal Grandmother   . Heart disease Maternal Grandmother   . Heart disease Maternal Grandfather   . Heart disease Daughter   . CAD Mother   . Failure to thrive Mother   . Breast cancer Neg Hx     Social History:  reports that she has never smoked. She has never used smokeless tobacco. She reports that she does not drink alcohol or use drugs.  ROS: UROLOGY Frequent Urination?: No Hard to postpone urination?: No Burning/pain with urination?: No Get up at night to urinate?: No Leakage of urine?: No Urine stream starts and stops?: No Trouble starting stream?: No Do you have to strain to urinate?: No Blood in urine?: No Urinary tract infection?: No Sexually transmitted disease?: No Injury to kidneys or bladder?: No Painful intercourse?: No Weak stream?: No Currently pregnant?: No Vaginal bleeding?: No Last menstrual period?: n  Gastrointestinal Nausea?: No Vomiting?: No Indigestion/heartburn?: No Diarrhea?: No Constipation?: No  Constitutional Fever: No Night sweats?: No Weight loss?: No Fatigue?: No  Skin Skin rash/lesions?: No Itching?: No  Eyes Blurred vision?: No Double vision?: No  Ears/Nose/Throat Sore throat?: No Sinus problems?: No  Hematologic/Lymphatic Swollen glands?: No Easy bruising?: No  Cardiovascular Leg swelling?: No Chest pain?: No  Respiratory Cough?: No Shortness of breath?: No  Endocrine Excessive  thirst?: No  Musculoskeletal Back pain?: No Joint pain?: No  Neurological Headaches?: No Dizziness?: No  Psychologic Depression?: No Anxiety?: No  Physical Exam: BP 119/74 (BP Location: Left Arm, Patient Position: Sitting, Cuff Size: Large)   Pulse 79   Ht 5' (1.524 m)   Wt 186 lb 6.4 oz (84.6 kg)   BMI 36.40 kg/m   Constitutional:  Alert and oriented, No acute distress.   Laboratory Data: Lab Results  Component Value Date   WBC 9.5 05/24/2018   HGB 12.3 05/24/2018   HCT 37.1 05/24/2018   MCV 97 05/24/2018   PLT 288 05/24/2018    Lab Results  Component Value Date   CREATININE 1.07 (H) 05/24/2018    No results found for: PSA  No results found for: TESTOSTERONE  Lab Results  Component Value Date   HGBA1C 6.1 (H) 01/18/2018  Urinalysis    Component Value Date/Time   COLORURINE YELLOW (A) 01/18/2018 1925   APPEARANCEUR CLEAR (A) 01/18/2018 1925   APPEARANCEUR Cloudy (A) 01/02/2018 0839   LABSPEC 1.006 01/18/2018 1925   PHURINE 7.0 01/18/2018 1925   GLUCOSEU NEGATIVE 01/18/2018 1925   HGBUR NEGATIVE 01/18/2018 1925   BILIRUBINUR NEGATIVE 01/18/2018 1925   BILIRUBINUR Negative 01/02/2018 [place] 01/18/2018 1925   PROTEINUR NEGATIVE 01/18/2018 1925   NITRITE NEGATIVE 01/18/2018 1925   LEUKOCYTESUR TRACE (A) 01/18/2018 1925   LEUKOCYTESUR 3+ (A) 01/02/2018 0839    Pertinent Imaging:   Assessment & Plan: 90 tablets and 3 refills sent.  I will see her in 1 year  1. Mixed incontinence Chronic cystitis - Urinalysis, Complete   No follow-ups on file.  Reece Packer, MD  [facility] [address] [place], [place] [address] [phone]

## 2018-07-11 ENCOUNTER — Ambulatory Visit (INDEPENDENT_AMBULATORY_CARE_PROVIDER_SITE_OTHER): Payer: Medicare Other | Admitting: Family Medicine

## 2018-07-11 ENCOUNTER — Encounter: Payer: Self-pay | Admitting: Family Medicine

## 2018-07-11 VITALS — BP 116/76 | HR 73 | Temp 97.6°F | Ht 59.0 in | Wt 187.3 lb

## 2018-07-11 DIAGNOSIS — M79604 Pain in right leg: Secondary | ICD-10-CM | POA: Diagnosis not present

## 2018-07-11 DIAGNOSIS — M79605 Pain in left leg: Secondary | ICD-10-CM | POA: Diagnosis not present

## 2018-07-11 NOTE — Progress Notes (Signed)
BP 116/76 (BP Location: Left Arm, Patient Position: Sitting, Cuff Size: Normal)   Pulse 73   Temp 97.6 F (36.4 C) (Oral)   Ht 4\' 11"  (1.499 m)   Wt 187 lb 4.8 oz (85 kg)   SpO2 96%   BMI 37.83 kg/m    Subjective:    Patient ID: Sarah Freeman, female    DOB: [birth year], 84 y.o.   MRN: [id number]  HPI: TIM WILHIDE is a 84 y.o. female  Chief Complaint  Patient presents with  . Leg Pain    Bilateral leg pain. Patient states her legs don't hurt if she's laying down. Ongoing for 5 days.   Dull ache b/l anterior thighs x 4-5 days. Taking tylenol with moderate relief. Walking makes it worse, laying flat makes better. No known causative event such as bending or lifting something heavy, no back pain, no numbness, tingling, or leg weakness, recent illness, swelling, discolorations. Drinks lots of water. No current statin therapy.   Relevant past medical, surgical, family and social history reviewed and updated as indicated. Interim medical history since our last visit reviewed. Allergies and medications reviewed and updated.  Review of Systems  Per HPI unless specifically indicated above     Objective:    BP 116/76 (BP Location: Left Arm, Patient Position: Sitting, Cuff Size: Normal)   Pulse 73   Temp 97.6 F (36.4 C) (Oral)   Ht 4\' 11"  (1.499 m)   Wt 187 lb 4.8 oz (85 kg)   SpO2 96%   BMI 37.83 kg/m   Wt Readings from Last 3 Encounters:  07/11/18 187 lb 4.8 oz (85 kg)  06/26/18 186 lb 6.4 oz (84.6 kg)  05/24/18 184 lb 9.6 oz (83.7 kg)    Physical Exam  Constitutional: She is oriented to person, place, and time. She appears well-developed and well-nourished. No distress.  HENT:  Head: Atraumatic.  Eyes: Conjunctivae and EOM are normal.  Neck: Normal range of motion. Neck supple.  Cardiovascular: Normal rate and intact distal pulses.  Pulmonary/Chest: Effort normal and breath sounds normal.  Musculoskeletal: Normal range of motion. She exhibits no edema.    Strength in B/l legs full and equal b/l B/l anterior thighs non-ttp Lumbar spinous processes non-ttp  Neurological: She is alert and oriented to person, place, and time. No sensory deficit.  Skin: Skin is warm and dry.  Psychiatric: She has a normal mood and affect. Her behavior is normal.  Nursing note and vitals reviewed.   Results for orders placed or performed in visit on 06/26/18  Microscopic Examination  Result Value Ref Range   WBC, UA 6-10 (A) 0 - 5 /hpf   RBC, UA None seen 0 - 2 /hpf   Epithelial Cells (non renal) 0-10 0 - 10 /hpf   Renal Epithel, UA 0-10 (A) None seen /hpf   Bacteria, UA Few (A) None seen/Few  Urinalysis, Complete  Result Value Ref Range   Specific Gravity, UA 1.015 1.005 - 1.030   pH, UA 5.5 5.0 - 7.5   Color, UA Yellow Yellow   Appearance Ur Clear Clear   Leukocytes, UA 1+ (A) Negative   Protein, UA Negative Negative/Trace   Glucose, UA Negative Negative   Ketones, UA Negative Negative   RBC, UA Negative Negative   Bilirubin, UA Negative Negative   Urobilinogen, Ur 0.2 0.2 - 1.0 mg/dL   Nitrite, UA Negative Negative   Microscopic Examination See below:       Assessment & Plan:  Problem List Items Addressed This Visit    None    Visit Diagnoses    Leg pain, bilateral    -  Primary   Exam and vitals benign, no indication for x-rays currently. Pt opting to wait on lab studies for conservative management. Epsom soaks, heating pads, tylenol    Return precautions reviewed.   Follow up plan: Return if symptoms worsen or fail to improve.

## 2018-07-12 ENCOUNTER — Other Ambulatory Visit: Payer: Self-pay

## 2018-07-12 ENCOUNTER — Emergency Department
Admission: EM | Admit: 2018-07-12 | Discharge: 2018-07-12 | Disposition: A | Discharge status: Home / Self Care | Payer: Medicare Other | Attending: Emergency Medicine | Admitting: Emergency Medicine

## 2018-07-12 ENCOUNTER — Emergency Department: Payer: Medicare Other

## 2018-07-12 ENCOUNTER — Encounter: Payer: Self-pay | Admitting: Emergency Medicine

## 2018-07-12 DIAGNOSIS — M79604 Pain in right leg: Secondary | ICD-10-CM | POA: Insufficient documentation

## 2018-07-12 DIAGNOSIS — M79605 Pain in left leg: Secondary | ICD-10-CM | POA: Diagnosis not present

## 2018-07-12 DIAGNOSIS — R252 Cramp and spasm: Secondary | ICD-10-CM | POA: Diagnosis present

## 2018-07-12 DIAGNOSIS — N183 Chronic kidney disease, stage 3 (moderate): Secondary | ICD-10-CM | POA: Insufficient documentation

## 2018-07-12 DIAGNOSIS — Z85828 Personal history of other malignant neoplasm of skin: Secondary | ICD-10-CM | POA: Insufficient documentation

## 2018-07-12 MED ORDER — PREDNISONE 50 MG PO TABS: ORAL_TABLET | ORAL | 0 refills | Status: DC

## 2018-07-12 MED ORDER — CYCLOBENZAPRINE HCL 10 MG PO TABS: 10.0000 mg | ORAL_TABLET | Freq: Three times a day (TID) | ORAL | 0 refills | Status: DC | PRN

## 2018-07-12 NOTE — ED Triage Notes (Signed)
Pt arrived via POV with son with reports of bilateral thigh pain for the past week, pt was seen by PCP yesterday and was dx with leg cramps, pt states the pain is a hard pain and is keeping her up at night. Pt states she has tried using heat and took 500mg  of Tylenol this morning around 0400.  Pt states the pain is worse when walking. Pt denies any swelling to lower extremities.    Lower extremities are warm to the touch, dry and intact.  Pulses intact.

## 2018-07-12 NOTE — ED Provider Notes (Signed)
[facility] Emergency Department Provider Note       Time seen: ----------------------------------------- 7:15 AM on 07/12/2018 -----------------------------------------   I have reviewed the triage vital signs and the nursing notes.  HISTORY   Chief Complaint Leg Pain    HPI Sarah Freeman is a 84 y.o. female with a history of hyperlipidemia, hypothyroidism, CVA and urinary incontinence who presents to the ED for bilateral leg pain.  Patient states she was seen by her primary care doctor yesterday and was diagnosed with leg cramps.  She describes a hard pain that is keeping her up at night.  She used heat as well as Tylenol this morning around 4 AM.  Pain seems to be worse with walking.  She denies any recent illness, swelling, erythema or other complaints.  Past Medical History:  Diagnosis Date  . Hyperlipidemia   . Hypothyroid   . Stroke ([place])   . Urinary incontinence     Patient Active Problem List   Diagnosis Date Noted  . Chronic kidney disease, stage 3 ([place]) 05/24/2018  . Morbid obesity ([place]) 02/02/2018  . Carotid stenosis 02/01/2018  . Atherosclerosis 01/25/2018  . Left arm weakness 01/18/2018  . History of stroke 06/14/2017  . Statin intolerance 06/14/2017  . Counseling regarding advanced directives and goals of care 02/22/2017  . Constipation 09/01/2016  . Stroke (cerebrum) ([place]) 02/15/2016  . Osteopenia 01/29/2016  . Menopausal state 01/29/2016  . Hypothyroidism 01/29/2016  . GERD (gastroesophageal reflux disease) 01/29/2016  . Hypercholesterolemia 01/29/2016  . Lumbar spinal stenosis 01/29/2016  . Stress incontinence 01/29/2016  . Breast microcalcification, mammographic 06/17/2015    Past Surgical History:  Procedure Laterality Date  . ABDOMINAL HYSTERECTOMY     age 9  . BREAST BIOPSY Left 2000?   neg. dr. Dwyane Luo office  . CHOLECYSTECTOMY  1964  . COLONOSCOPY     Dr Nicolasa Ducking  . DILATION AND CURETTAGE OF UTERUS    .  EYE SURGERY    . LOOP RECORDER INSERTION N/A 02/09/2018   Procedure: LOOP RECORDER INSERTION;  Surgeon: Deboraha Sprang, MD;  Location: [facility];  Service: Cardiovascular;  Laterality: N/A;  . SKIN CANCER EXCISION     face  . TEE WITHOUT CARDIOVERSION N/A 02/09/2018   Procedure: TRANSESOPHAGEAL ECHOCARDIOGRAM (TEE);  Surgeon: Minna Merritts, MD;  Location: [facility];  Service: Cardiovascular;  Laterality: N/A;    Allergies Neosporin [neomycin-polymyxin-gramicidin] and Statins  Social History Social History   Tobacco Use  . Smoking status: Never Smoker  . Smokeless tobacco: Never Used  Substance Use Topics  . Alcohol use: No    Alcohol/week: 0.0 standard drinks  . Drug use: No   Review of Systems Constitutional: Negative for fever. Cardiovascular: Negative for chest pain. Respiratory: Negative for shortness of breath. Gastrointestinal: Negative for abdominal pain, vomiting and diarrhea. Musculoskeletal: Positive for bilateral thigh pain Skin: Negative for rash. Neurological: Negative for headaches, focal weakness or numbness.  All systems negative/normal/unremarkable except as stated in the HPI  ____________________________________________   PHYSICAL EXAM:  VITAL SIGNS: ED Triage Vitals [07/12/18 0705]  Enc Vitals Group     BP 128/75     Pulse Rate 80     Resp 18     Temp 98.3 F (36.8 C)     Temp Source Oral     SpO2 100 %     Weight      Height      Head Circumference      Peak Flow  Pain Score      Pain Loc      Pain Edu?      Excl. in White?    Constitutional: Alert and oriented. Well appearing and in no distress. Eyes: Conjunctivae are normal. Normal extraocular movements. Musculoskeletal: Nontender with normal range of motion in extremities. No lower extremity tenderness nor edema.  Patient points to her distal quadricep area bilaterally as to the location of her pain.  Normal dorsalis pedis pulses bilaterally Neurologic:  Normal speech  and language. No gross focal neurologic deficits are appreciated.  Skin:  Skin is warm, dry and intact. No rash noted. Psychiatric: Mood and affect are normal. Speech and behavior are normal.  ___________________________________________  ED COURSE:  As part of my medical decision making, I reviewed the following data within the [place] History obtained from family if available, nursing notes, old chart and ekg, as well as notes from prior ED visits. Patient presented for lateral leg pain, we will assess with imaging as indicated at this time.   Procedures ____________________________________________   RADIOLOGY Images were viewed by me  Femur x-rays Are negative ____________________________________________  DIFFERENTIAL DIAGNOSIS   Strain, spasm, other musculoskeletal pain  FINAL ASSESSMENT AND PLAN  Bilateral leg pain   Plan: The patient had presented for bilateral leg pain. Patient's imaging did not reveal any acute process.  She has a normal examination of her legs.  Likely possibilities are referred pain from her back or strain that she does not recall and should resolve.  She may eventually require a bone scan should the pain persist.  I will try steroids for her as well as muscle relaxants.  Otherwise she is cleared for outpatient follow-up.   Laurence Aly, MD   Note: This note was generated in part or whole with voice recognition software. Voice recognition is usually quite accurate but there are transcription errors that can and very often do occur. I apologize for any typographical errors that were not detected and corrected.     Earleen Newport, MD 07/12/18 [phone]

## 2018-07-13 NOTE — Patient Instructions (Signed)
Follow up as needed

## 2018-07-21 ENCOUNTER — Ambulatory Visit (INDEPENDENT_AMBULATORY_CARE_PROVIDER_SITE_OTHER): Payer: Medicare Other | Admitting: *Deleted

## 2018-07-21 DIAGNOSIS — I639 Cerebral infarction, unspecified: Secondary | ICD-10-CM

## 2018-07-24 NOTE — Progress Notes (Signed)
Carelink Summary Report / Loop Recorder 

## 2018-07-25 ENCOUNTER — Telehealth: Payer: Self-pay | Admitting: Family Medicine

## 2018-07-25 DIAGNOSIS — M79604 Pain in right leg: Secondary | ICD-10-CM

## 2018-07-25 DIAGNOSIS — M79605 Pain in left leg: Principal | ICD-10-CM

## 2018-07-25 NOTE — Telephone Encounter (Signed)
Looks like you saw her 2 weeks ago.   Copied from [place] [phone]. Topic: Referral - Request >> Jul 25, 2018  9:44 AM Carolyn Stare wrote:  Pt call to ask for a referral to see a rheumatologist Dr Meda Coffee at [facility] for pain in her knees

## 2018-07-26 NOTE — Telephone Encounter (Signed)
Referral placed.

## 2018-08-03 LAB — CUP PACEART REMOTE DEVICE CHECK
Date Time Interrogation Session: 20190721140649
MDC IDC PG IMPLANT DT: 20190314

## 2018-08-11 ENCOUNTER — Other Ambulatory Visit: Payer: Self-pay | Admitting: Internal Medicine

## 2018-08-13 NOTE — Progress Notes (Signed)
Cardiology Office Note  Date:  08/15/2018   ID:  Sarah Freeman, DOB Apr 23, 1931, MRN [id number]  PCP:  Kathrine Haddock, NP   Chief Complaint  Patient presents with  . other    6 month follow up. Meds reviewed by the pt. verbally. "doing well."     HPI:  Sarah Freeman is a 84 year old woman with past medical history of  Aortic atherosclerosis History of stroke watershed infarct 2 years ago, distribution infarcts in the right  MCA. Discharged on aspirin Plavix March 2017 Urinary tract infections  Repeat hospitalization February 2019 for recurrent stroke EF >55% in 01/2018 Who presents for follow up of her stroke symptoms  Legs sore on the top, thighs,  Went to ER 07/12/2018 Hurt that AM, even at rest Leg pain went away after several days without intervention  Does not want statin or zetia Reports having a reaction on both, muscle ache  TEE 02/09/2018  Moderate aortic athero noted  Reveal device placed Monthly downloads reviewed with her in detail No atrial fib noted  EKG personally reviewed by myself on todays visit Shows normal sinus rhythm rate 73 bpm no significant ST-T wave changes  Other past medical history reviewed  hospital admission February 2019 Left arm weakness, incoordination, numbness MRI noted for right parietal infarct carotid Dopplers with plaque, no hemodynamically significant stenosis echocardiogram noted for normal ejection fraction with stage I diastolic dysfunction Hospital records reviewed with the patient in detail HerDischarged on aspirin Plavix UTI with E. coli Seen by neurology who recommended cardiology follow-up for event monitor  She was seen by neurology but no monitor was ordered  Previously on pravastatin She discontinued this secondary to myalgias Only wants to take red yeast rice  Family of MI and CVA  Other records reviewed with her on today's visit MRI  01/18/18 shows Background pattern of chronic small vessel ischemic  changes of the cerebral hemispheric white matter an old watershed infarction within the right hemispheric white matter and right frontal cortical and subcortical brain. Few small areas of acute infarction are present in the right parietal deep white matter and a single right parietal gyrus, possibly the precentral gyrus. No swelling or hemorrhage.   DATA SUMMARIES:  01/18/2018  CT HEAD WO CONTRAST  IMPRESSION: No acute abnormality. Atrophy and chronic microvascular ischemic change. Atherosclerosis. Small right mastoid effusion.  01/18/2018  MR BRAIN Wo Contrast  IMPRESSION: Background pattern of chronic small vessel ischemic changes of the cerebral hemispheric white matter an old watershed infarction within the right hemispheric white matter and right frontal cortical and subcortical brain. Few small areas of acute infarction are present in the right parietal deep white matter and a single right parietal gyrus, possibly the precentral gyrus. No swelling or hemorrhage.   EXAM: BILATERAL CAROTID DUPLEX ULTRASOUND  RIGHT CAROTID ARTERY: There is a minimal to moderate amount of atherosclerotic plaque within the right carotid bulb (image 15), not resulting in elevated peak systolic velocities within the interrogated course the right internal carotid artery to suggest a hemodynamically significant stenosis.  RIGHT VERTEBRAL ARTERY:Antegrade Flow  LEFT CAROTID ARTERY: There is a minimal amount of atherosclerotic plaque within the left carotid bulb (image 47), not resulting in elevated peak systolic velocities within the interrogated course the left internal carotid artery to suggest a hemodynamically significant stenosis.  LEFT VERTEBRAL ARTERY:Antegrade flow  IMPRESSION: Minimal to moderate amount of bilateral atherosclerotic plaque, right greater than left, not resulting in a hemodynamically significant stenosis within either internal carotid artery.  PMH:   has a  past medical history of Hyperlipidemia, Hypothyroid, Stroke ([place]), and Urinary incontinence.  PSH:    Past Surgical History:  Procedure Laterality Date  . ABDOMINAL HYSTERECTOMY     age 59  . BREAST BIOPSY Left 2000?   neg. dr. Dwyane Luo office  . CHOLECYSTECTOMY  1964  . COLONOSCOPY     Dr Nicolasa Ducking  . DILATION AND CURETTAGE OF UTERUS    . EYE SURGERY    . LOOP RECORDER INSERTION N/A 02/09/2018   Procedure: LOOP RECORDER INSERTION;  Surgeon: Deboraha Sprang, MD;  Location: [facility];  Service: Cardiovascular;  Laterality: N/A;  . SKIN CANCER EXCISION     face  . TEE WITHOUT CARDIOVERSION N/A 02/09/2018   Procedure: TRANSESOPHAGEAL ECHOCARDIOGRAM (TEE);  Surgeon: Minna Merritts, MD;  Location: [facility];  Service: Cardiovascular;  Laterality: N/A;    Current Outpatient Medications  Medication Sig Dispense Refill  . acetaminophen (TYLENOL ARTHRITIS PAIN) 650 MG CR tablet Take 650 mg by mouth every 8 (eight) hours as needed for pain.    Marland Kitchen aspirin 81 MG EC tablet Take 1 tablet (81 mg total) by mouth daily. 180 tablet 0  . clopidogrel (PLAVIX) 75 MG tablet Take 1 tablet (75 mg total) by mouth daily. 90 tablet 1  . diphenhydramine-acetaminophen (TYLENOL PM) 25-500 MG TABS tablet Take 1 tablet by mouth at bedtime as needed (for sleep).     Marland Kitchen esomeprazole (NEXIUM) 40 MG capsule Take 1 capsule (40 mg total) by mouth daily. 90 capsule 3  . levothyroxine (SYNTHROID, LEVOTHROID) 50 MCG tablet Take 1 tablet (50 mcg total) by mouth at bedtime. 90 tablet 1  . psyllium (HYDROCIL/METAMUCIL) 95 % PACK Take 1 packet by mouth daily as needed for mild constipation.    . Red Yeast Rice Extract (RED YEAST RICE PO) Take 1 capsule by mouth daily.     Marland Kitchen trimethoprim (TRIMPEX) 100 MG tablet Take 1 tablet (100 mg total) by mouth daily. 90 tablet 3   No current facility-administered medications for this visit.      Allergies:   Statins and Neomycin-polymyxin-gramicidin   Social History:  The  patient  reports that she has never smoked. She has never used smokeless tobacco. She reports that she does not drink alcohol or use drugs.   Family History:   family history includes CAD in her mother; Cancer in her father; Diabetes in her maternal grandmother; Failure to thrive in her mother; Heart disease in her daughter, maternal grandfather, maternal grandmother, and son; Hyperlipidemia in her daughter; Hypertension in her sister; Stroke in her father.    Review of Systems: Review of Systems  Constitutional: Negative.   Respiratory: Negative.   Cardiovascular: Negative.   Gastrointestinal: Negative.   Musculoskeletal: Negative.   Neurological: Negative.   Psychiatric/Behavioral: Negative.   All other systems reviewed and are negative.    PHYSICAL EXAM: VS:  BP 120/80 (BP Location: Left Arm, Patient Position: Sitting, Cuff Size: Large)   Pulse 73   Ht 5' (1.524 m)   Wt 186 lb 12 oz (84.7 kg)   BMI 36.47 kg/m  , BMI Body mass index is 36.47 kg/m. Constitutional:  oriented to person, place, and time. No distress.  HENT:  Head: Normocephalic and atraumatic.  Eyes:  no discharge. No scleral icterus.  Neck: Normal range of motion. Neck supple. No JVD present.  Cardiovascular: Normal rate, regular rhythm, normal heart sounds and intact distal pulses. Exam reveals no gallop and no  friction rub. No edema No murmur heard. Pulmonary/Chest: Effort normal and breath sounds normal. No stridor. No respiratory distress.  no wheezes.  no rales.  no tenderness.  Abdominal: Soft.  no distension.  no tenderness.  Musculoskeletal: Normal range of motion.  no  tenderness or deformity.  Neurological:  normal muscle tone. Coordination normal. No atrophy Skin: Skin is warm and dry. No rash noted. not diaphoretic.  Psychiatric:  normal mood and affect. behavior is normal. Thought content normal.   Recent Labs: 10/05/2017: TSH 4.520 05/24/2018: ALT 14; BUN 12; Creatinine, Ser 1.07; Hemoglobin 12.3;  Platelets 288; Potassium 4.6; Sodium 139    Lipid Panel Lab Results  Component Value Date   CHOL 158 05/24/2018   HDL 45 05/24/2018   LDLCALC 86 05/24/2018   TRIG 133 05/24/2018      Wt Readings from Last 3 Encounters:  08/15/18 186 lb 12 oz (84.7 kg)  07/11/18 187 lb 4.8 oz (85 kg)  06/26/18 186 lb 6.4 oz (84.6 kg)       ASSESSMENT AND PLAN:  Cerebrovascular accident (CVA) due to thrombosis of right middle cerebral artery ([place]) - Plan: EKG 12-Lead No further TIA or stroke symptoms On aspirin Plavix Does not want a statin or Zetia Loop monitor downloads reviewed with her in detail showing no atrial fibrillation She does have moderate diffuse aortic atherosclerosis particularly in the arch  Bilateral carotid artery stenosis Mild to moderate disease also with vertebral artery disease Currently on aspirin and Plavix Does not want a statin  Mixed hyperlipidemia - Plan: EKG 12-Lead As above reluctant to take  statin Only takes red yeast rice Was previously on pravastatin  Morbid obesity We have encouraged continued exercise, careful diet management in an effort to lose weight.  Discussed again with her in detail   Disposition:   F/U  12 months   Total encounter time more than 25 minutes  Greater than 50% was spent in counseling and coordination of care with the patient   Orders Placed This Encounter  Procedures  . EKG 12-Lead     Signed, Esmond Plants, M.D., Ph.D. 08/15/2018  [place], [place]

## 2018-08-15 ENCOUNTER — Encounter: Payer: Self-pay | Admitting: Cardiovascular Disease

## 2018-08-15 ENCOUNTER — Ambulatory Visit (INDEPENDENT_AMBULATORY_CARE_PROVIDER_SITE_OTHER): Payer: Medicare Other | Admitting: Internal Medicine

## 2018-08-15 ENCOUNTER — Encounter: Payer: Self-pay | Admitting: Internal Medicine

## 2018-08-15 ENCOUNTER — Ambulatory Visit (INDEPENDENT_AMBULATORY_CARE_PROVIDER_SITE_OTHER): Payer: Medicare Other | Admitting: Cardiovascular Disease

## 2018-08-15 VITALS — BP 120/80 | HR 73 | Ht 60.0 in | Wt 186.8 lb

## 2018-08-15 DIAGNOSIS — E782 Mixed hyperlipidemia: Secondary | ICD-10-CM | POA: Diagnosis not present

## 2018-08-15 DIAGNOSIS — Z959 Presence of cardiac and vascular implant and graft, unspecified: Secondary | ICD-10-CM

## 2018-08-15 DIAGNOSIS — I6523 Occlusion and stenosis of bilateral carotid arteries: Secondary | ICD-10-CM | POA: Diagnosis not present

## 2018-08-15 DIAGNOSIS — I639 Cerebral infarction, unspecified: Secondary | ICD-10-CM

## 2018-08-15 DIAGNOSIS — I700 Atherosclerosis of aorta: Secondary | ICD-10-CM

## 2018-08-15 NOTE — Patient Instructions (Signed)
Medication Instructions: - Your physician has recommended you make the following change in your medication:   1) STOP aspirin  Labwork: - none ordered  Procedures/Testing: - none ordered  Follow-Up: - Dr. Caryl Comes will see you back on an as needed basis.   Any Additional Special Instructions Will Be Listed Below (If Applicable).     If you need a refill on your cardiac medications before your next appointment, please call your pharmacy.

## 2018-08-15 NOTE — Patient Instructions (Signed)

## 2018-08-15 NOTE — Progress Notes (Signed)
Patient Care Team: Kathrine Haddock, NP as PCP - General (Nurse Practitioner) Robert Bellow, MD (General Surgery) Valerie Roys, DO as Referring Physician (Family Medicine)   HPI  Sarah Freeman is a 84 y.o. female Seen in followup for ILR implanted for recurrent stroke, watershed 2017 and repeat 2019  MRI>> R parietal infarct Rx w DAPT and red yeast rice, intolerant of statins   DATE TEST EF   2/19 Echo   60-65 % RVE mild  3/19 .TEE  55-60 %         No chest pain or shortness of breath or edema  Some bleeding problems following recent biopsy     Records and Results Reviewed   Past Medical History:  Diagnosis Date  . Hyperlipidemia   . Hypothyroid   . Stroke ([place])   . Urinary incontinence     Past Surgical History:  Procedure Laterality Date  . ABDOMINAL HYSTERECTOMY     age 49  . BREAST BIOPSY Left 2000?   neg. dr. Dwyane Luo office  . CHOLECYSTECTOMY  1964  . COLONOSCOPY     Dr Nicolasa Ducking  . DILATION AND CURETTAGE OF UTERUS    . EYE SURGERY    . LOOP RECORDER INSERTION N/A 02/09/2018   Procedure: LOOP RECORDER INSERTION;  Surgeon: Deboraha Sprang, MD;  Location: [facility];  Service: Cardiovascular;  Laterality: N/A;  . SKIN CANCER EXCISION     face  . TEE WITHOUT CARDIOVERSION N/A 02/09/2018   Procedure: TRANSESOPHAGEAL ECHOCARDIOGRAM (TEE);  Surgeon: Minna Merritts, MD;  Location: [facility];  Service: Cardiovascular;  Laterality: N/A;    Current Meds  Medication Sig  . acetaminophen (TYLENOL ARTHRITIS PAIN) 650 MG CR tablet Take 650 mg by mouth every 8 (eight) hours as needed for pain.  Marland Kitchen aspirin 81 MG EC tablet Take 1 tablet (81 mg total) by mouth daily.  . clopidogrel (PLAVIX) 75 MG tablet Take 1 tablet (75 mg total) by mouth daily.  . diphenhydramine-acetaminophen (TYLENOL PM) 25-500 MG TABS tablet Take 1 tablet by mouth at bedtime as needed (for sleep).   Marland Kitchen esomeprazole (NEXIUM) 40 MG capsule Take 1 capsule (40 mg total) by  mouth daily.  Marland Kitchen levothyroxine (SYNTHROID, LEVOTHROID) 50 MCG tablet Take 1 tablet (50 mcg total) by mouth at bedtime.  . psyllium (HYDROCIL/METAMUCIL) 95 % PACK Take 1 packet by mouth daily as needed for mild constipation.  . Red Yeast Rice Extract (RED YEAST RICE PO) Take 1 capsule by mouth daily.   Marland Kitchen trimethoprim (TRIMPEX) 100 MG tablet Take 1 tablet (100 mg total) by mouth daily.    Allergies  Allergen Reactions  . Statins Other (See Comments)    Leg cramps Leg cramps  . Neomycin-Polymyxin-Gramicidin Other (See Comments)    Pt states wounds do not heal with neosporin Pt states wounds do not heal with neosporin      Review of Systems negative except from HPI and PMH  Physical Exam BP 120/80 (BP Location: Left Arm, Patient Position: Sitting, Cuff Size: Normal)   Pulse 73   Ht 5' (1.524 m)   Wt 186 lb 12 oz (84.7 kg)   BMI 36.47 kg/m  Well developed and well nourished in no acute distress HENT normal E scleral and icterus clear Neck Supple JVP flat; carotids brisk and full Clear to ausculation  Regular rate and rhythm, no murmurs gallops or rub Soft with active bowel sounds No clubbing cyanosis  Edema Alert and  oriented, grossly normal motor and sensory function Skin Warm and Dry    Assessment and  Plan  Recurrent stroke  HTN  ILR  SVT nonsustained   SVT without symptoms  No AFib  Literature reviewed ( UPTODATE 2019) and discussed with DR TG  Will stop ASA and use plavix alone  Intolerant of statins for secondary prevention  Will follow remotely  We spent more than 50% of our >25 min visit in face to face counseling regarding the above     Current medicines are reviewed at length with the patient today .  The patient does not  have concerns regarding medicines.

## 2018-08-16 LAB — CUP PACEART INCLINIC DEVICE CHECK
Date Time Interrogation Session: 20190917130539
Implantable Pulse Generator Implant Date: 20190314

## 2018-08-22 LAB — CUP PACEART REMOTE DEVICE CHECK
Implantable Pulse Generator Implant Date: 20190314
MDC IDC SESS DTM: 20190823143634

## 2018-08-23 ENCOUNTER — Ambulatory Visit (INDEPENDENT_AMBULATORY_CARE_PROVIDER_SITE_OTHER): Payer: Medicare Other | Admitting: *Deleted

## 2018-08-23 DIAGNOSIS — I639 Cerebral infarction, unspecified: Secondary | ICD-10-CM

## 2018-08-24 NOTE — Progress Notes (Signed)
Carelink Summary Report / Loop Recorder 

## 2018-08-28 ENCOUNTER — Other Ambulatory Visit: Payer: Self-pay | Admitting: Unknown Physician Specialty

## 2018-08-28 LAB — CUP PACEART REMOTE DEVICE CHECK
Implantable Pulse Generator Implant Date: 20190314
MDC IDC SESS DTM: 20190925144139

## 2018-08-28 NOTE — Telephone Encounter (Signed)
clopidogrel refill Last Refill:03/21/18 # 90 1 RF Last OV: 05/24/18 PCP: Kathrine Haddock NP Pharmacy:[facility] [address]  levothyroxine refill Last Refill:03/21/18 # 90 1 RF Last OV: 10/05/17

## 2018-08-31 ENCOUNTER — Other Ambulatory Visit: Payer: Self-pay | Admitting: Internal Medicine

## 2018-09-25 ENCOUNTER — Ambulatory Visit (INDEPENDENT_AMBULATORY_CARE_PROVIDER_SITE_OTHER): Payer: Medicare Other | Admitting: *Deleted

## 2018-09-25 DIAGNOSIS — I639 Cerebral infarction, unspecified: Secondary | ICD-10-CM

## 2018-09-26 NOTE — Progress Notes (Signed)
Carelink Summary Report / Loop Recorder 

## 2018-10-09 ENCOUNTER — Encounter: Payer: Medicare Other | Admitting: Unknown Physician Specialty

## 2018-10-11 ENCOUNTER — Encounter: Payer: Self-pay | Admitting: Family Medicine

## 2018-10-11 ENCOUNTER — Ambulatory Visit (INDEPENDENT_AMBULATORY_CARE_PROVIDER_SITE_OTHER): Payer: Medicare Other | Admitting: Family Medicine

## 2018-10-11 VITALS — BP 123/80 | HR 74 | Temp 98.4°F | Ht 59.5 in | Wt 190.1 lb

## 2018-10-11 DIAGNOSIS — M8580 Other specified disorders of bone density and structure, unspecified site: Secondary | ICD-10-CM

## 2018-10-11 DIAGNOSIS — E039 Hypothyroidism, unspecified: Secondary | ICD-10-CM | POA: Diagnosis not present

## 2018-10-11 DIAGNOSIS — Z23 Encounter for immunization: Secondary | ICD-10-CM

## 2018-10-11 DIAGNOSIS — I471 Supraventricular tachycardia, unspecified: Secondary | ICD-10-CM

## 2018-10-11 DIAGNOSIS — E7800 Pure hypercholesterolemia, unspecified: Secondary | ICD-10-CM

## 2018-10-11 DIAGNOSIS — Z0000 Encounter for general adult medical examination without abnormal findings: Secondary | ICD-10-CM

## 2018-10-11 DIAGNOSIS — Z13820 Encounter for screening for osteoporosis: Secondary | ICD-10-CM

## 2018-10-11 DIAGNOSIS — I10 Essential (primary) hypertension: Secondary | ICD-10-CM

## 2018-10-11 DIAGNOSIS — N183 Chronic kidney disease, stage 3 unspecified: Secondary | ICD-10-CM

## 2018-10-11 DIAGNOSIS — I7090 Unspecified atherosclerosis: Secondary | ICD-10-CM

## 2018-10-11 DIAGNOSIS — Z8673 Personal history of transient ischemic attack (TIA), and cerebral infarction without residual deficits: Secondary | ICD-10-CM

## 2018-10-11 LAB — MICROALBUMIN, URINE WAIVED
Creatinine, Urine Waived: 100 mg/dL (ref 10–300)
Microalb, Ur Waived: 30 mg/L — ABNORMAL HIGH (ref 0–19)

## 2018-10-11 LAB — UA/M W/RFLX CULTURE, ROUTINE
Bilirubin, UA: NEGATIVE
GLUCOSE, UA: NEGATIVE
KETONES UA: NEGATIVE
NITRITE UA: NEGATIVE
Protein, UA: NEGATIVE
RBC, UA: NEGATIVE
SPEC GRAV UA: 1.01 (ref 1.005–1.030)
UUROB: 0.2 mg/dL (ref 0.2–1.0)
pH, UA: 7 (ref 5.0–7.5)

## 2018-10-11 LAB — MICROSCOPIC EXAMINATION: Bacteria, UA: NONE SEEN

## 2018-10-11 MED ORDER — ESOMEPRAZOLE MAGNESIUM 40 MG PO CPDR: 40.0000 mg | DELAYED_RELEASE_CAPSULE | Freq: Every day | ORAL | 1 refills | Status: DC

## 2018-10-11 MED ORDER — CLOPIDOGREL BISULFATE 75 MG PO TABS: 75.0000 mg | ORAL_TABLET | Freq: Every day | ORAL | 1 refills | Status: DC

## 2018-10-11 NOTE — Assessment & Plan Note (Signed)
Under good control without medicine right now. Continue to monitor. Call with any concerns.

## 2018-10-11 NOTE — Assessment & Plan Note (Signed)
Rechecking DEXA today- hasn't had one in 10 years.

## 2018-10-11 NOTE — Assessment & Plan Note (Signed)
Not sustained- continue to follow with cardiology. Call with any concerns. Continue to monitor.

## 2018-10-11 NOTE — Patient Instructions (Addendum)
[facility]- 11/13/18 [place] [facility]  Address: [address], [place], [place] [address]  Phone: [phone]  Influenza (Flu) Vaccine (Inactivated or Recombinant): What You Need to Know 1. Why get vaccinated? Influenza ("flu") is a contagious disease that spreads around the [place] every year, usually between October and May. Flu is caused by influenza viruses, and is spread mainly by coughing, sneezing, and close contact. Anyone can get flu. Flu strikes suddenly and can last several days. Symptoms vary by age, but can include:  fever/chills  sore throat  muscle aches  fatigue  cough  headache  runny or stuffy nose  Flu can also lead to pneumonia and blood infections, and cause diarrhea and seizures in children. If you have a medical condition, such as heart or lung disease, flu can make it worse. Flu is more dangerous for some people. Infants and young children, people 57 years of age and older, pregnant women, and people with certain health conditions or a weakened immune system are at greatest risk. Each year thousands of people in the [place] die from flu, and many more are hospitalized. Flu vaccine can:  keep you from getting flu,  make flu less severe if you do get it, and  keep you from spreading flu to your family and other people. 2. Inactivated and recombinant flu vaccines A dose of flu vaccine is recommended every flu season. Children 6 months through 20 years of age may need two doses during the same flu season. Everyone else needs only one dose each flu season. Some inactivated flu vaccines contain a very small amount of a mercury-based preservative called thimerosal. Studies have not shown thimerosal in vaccines to be harmful, but flu vaccines that do not contain thimerosal are available. There is no live flu virus in flu shots. They cannot cause the flu. There are many flu viruses, and they are always  changing. Each year a new flu vaccine is made to protect against three or four viruses that are likely to cause disease in the upcoming flu season. But even when the vaccine doesn't exactly match these viruses, it may still provide some protection. Flu vaccine cannot prevent:  flu that is caused by a virus not covered by the vaccine, or  illnesses that look like flu but are not.  It takes about 2 weeks for protection to develop after vaccination, and protection lasts through the flu season. 3. Some people should not get this vaccine Tell the person who is giving you the vaccine:  If you have any severe, life-threatening allergies. If you ever had a life-threatening allergic reaction after a dose of flu vaccine, or have a severe allergy to any part of this vaccine, you may be advised not to get vaccinated. Most, but not all, types of flu vaccine contain a small amount of egg protein.  If you ever had Guillain-Barr Syndrome (also called GBS). Some people with a history of GBS should not get this vaccine. This should be discussed with your doctor.  If you are not feeling well. It is usually okay to get flu vaccine when you have a mild illness, but you might be asked to come back when you feel better.  4. Risks of a vaccine reaction With any medicine, including vaccines, there is a chance of reactions. These are usually mild and go away on their own, but serious reactions are also possible. Most people who get a flu shot do not have  any problems with it. Minor problems following a flu shot include:  soreness, redness, or swelling where the shot was given  hoarseness  sore, red or itchy eyes  cough  fever  aches  headache  itching  fatigue  If these problems occur, they usually begin soon after the shot and last 1 or 2 days. More serious problems following a flu shot can include the following:  There may be a small increased risk of Guillain-Barre Syndrome (GBS) after  inactivated flu vaccine. This risk has been estimated at 1 or 2 additional cases per million people vaccinated. This is much lower than the risk of severe complications from flu, which can be prevented by flu vaccine.  Young children who get the flu shot along with pneumococcal vaccine (PCV13) and/or DTaP vaccine at the same time might be slightly more likely to have a seizure caused by fever. Ask your doctor for more information. Tell your doctor if a child who is getting flu vaccine has ever had a seizure.  Problems that could happen after any injected vaccine:  People sometimes faint after a medical procedure, including vaccination. Sitting or lying down for about 15 minutes can help prevent fainting, and injuries caused by a fall. Tell your doctor if you feel dizzy, or have vision changes or ringing in the ears.  Some people get severe pain in the shoulder and have difficulty moving the arm where a shot was given. This happens very rarely.  Any medication can cause a severe allergic reaction. Such reactions from a vaccine are very rare, estimated at about 1 in a million doses, and would happen within a few minutes to a few hours after the vaccination. As with any medicine, there is a very remote chance of a vaccine causing a serious injury or death. The safety of vaccines is always being monitored. For more information, visit: http://www.aguilar.org/ 5. What if there is a serious reaction? What should I look for? Look for anything that concerns you, such as signs of a severe allergic reaction, very high fever, or unusual behavior. Signs of a severe allergic reaction can include hives, swelling of the face and throat, difficulty breathing, a fast heartbeat, dizziness, and weakness. These would start a few minutes to a few hours after the vaccination. What should I do?  If you think it is a severe allergic reaction or other emergency that can't wait, call 9-1-1 and get the person to the  nearest hospital. Otherwise, call your doctor.  Reactions should be reported to the Vaccine Adverse Event Reporting System (VAERS). Your doctor should file this report, or you can do it yourself through the VAERS web site at www.vaers.SamedayNews.es, or by calling (620)365-9136. ? VAERS does not give medical advice. 6. The National Vaccine Injury Compensation Program The [employer] Vaccine Injury Compensation Program (VICP) is a federal program that was created to compensate people who may have been injured by certain vaccines. Persons who believe they may have been injured by a vaccine can learn about the program and about filing a claim by calling 559-159-2726 or visiting the [employer] website at GoldCloset.com.ee. There is a time limit to file a claim for compensation. 7. How can I learn more?  Ask your healthcare provider. He or she can give you the vaccine package insert or suggest other sources of information.  Call your local or state health department.  Contact the Centers for Disease Control and Prevention (CDC): ? Call 8594128173 (1-800-CDC-INFO) or ? Visit CDC's website at https://gibson.com/  Vaccine Information Statement, Inactivated Influenza Vaccine (07/05/2014) This information is not intended to replace advice given to you by your health care provider. Make sure you discuss any questions you have with your health care provider. Document Released: 09/09/2006 Document Revised: 08/05/2016 Document Reviewed: 08/05/2016 Elsevier Interactive Patient Education  2017 [place] Maintenance for Postmenopausal Women Menopause is a normal process in which your reproductive ability comes to an end. This process happens gradually over a span of months to years, usually between the ages of 43 and 35. Menopause is complete when you have missed 12 consecutive menstrual periods. It is important to talk with your health care provider about some of the most common conditions that  affect postmenopausal women, such as heart disease, cancer, and bone loss (osteoporosis). Adopting a healthy lifestyle and getting preventive care can help to promote your health and wellness. Those actions can also lower your chances of developing some of these common conditions. What should I know about menopause? During menopause, you may experience a number of symptoms, such as:  Moderate-to-severe hot flashes.  Night sweats.  Decrease in sex drive.  Mood swings.  Headaches.  Tiredness.  Irritability.  Memory problems.  Insomnia.  Choosing to treat or not to treat menopausal changes is an individual decision that you make with your health care provider. What should I know about hormone replacement therapy and supplements? Hormone therapy products are effective for treating symptoms that are associated with menopause, such as hot flashes and night sweats. Hormone replacement carries certain risks, especially as you become older. If you are thinking about using estrogen or estrogen with progestin treatments, discuss the benefits and risks with your health care provider. What should I know about heart disease and stroke? Heart disease, heart attack, and stroke become more likely as you age. This may be due, in part, to the hormonal changes that your body experiences during menopause. These can affect how your body processes dietary fats, triglycerides, and cholesterol. Heart attack and stroke are both medical emergencies. There are many things that you can do to help prevent heart disease and stroke:  Have your blood pressure checked at least every 1-2 years. High blood pressure causes heart disease and increases the risk of stroke.  If you are 31-31 years old, ask your health care provider if you should take aspirin to prevent a heart attack or a stroke.  Do not use any tobacco products, including cigarettes, chewing tobacco, or electronic cigarettes. If you need help quitting, ask  your health care provider.  It is important to eat a healthy diet and maintain a healthy weight. ? Be sure to include plenty of vegetables, fruits, low-fat dairy products, and lean protein. ? Avoid eating foods that are high in solid fats, added sugars, or salt (sodium).  Get regular exercise. This is one of the most important things that you can do for your health. ? Try to exercise for at least 150 minutes each week. The type of exercise that you do should increase your heart rate and make you sweat. This is known as moderate-intensity exercise. ? Try to do strengthening exercises at least twice each week. Do these in addition to the moderate-intensity exercise.  Know your numbers.Ask your health care provider to check your cholesterol and your blood glucose. Continue to have your blood tested as directed by your health care provider.  What should I know about cancer screening? There are several types of cancer. Take the following steps to reduce  your risk and to catch any cancer development as early as possible. Breast Cancer  Practice breast self-awareness. ? This means understanding how your breasts normally appear and feel. ? It also means doing regular breast self-exams. Let your health care provider know about any changes, no matter how small.  If you are 88 or older, have a clinician do a breast exam (clinical breast exam or CBE) every year. Depending on your age, family history, and medical history, it may be recommended that you also have a yearly breast X-ray (mammogram).  If you have a family history of breast cancer, talk with your health care provider about genetic screening.  If you are at high risk for breast cancer, talk with your health care provider about having an MRI and a mammogram every year.  Breast cancer (BRCA) gene test is recommended for women who have family members with BRCA-related cancers. Results of the assessment will determine the need for genetic  counseling and BRCA1 and for BRCA2 testing. BRCA-related cancers include these types: ? Breast. This occurs in males or females. ? Ovarian. ? Tubal. This may also be called fallopian tube cancer. ? Cancer of the abdominal or pelvic lining (peritoneal cancer). ? Prostate. ? Pancreatic.  Cervical, Uterine, and Ovarian Cancer Your health care provider may recommend that you be screened regularly for cancer of the pelvic organs. These include your ovaries, uterus, and vagina. This screening involves a pelvic exam, which includes checking for microscopic changes to the surface of your cervix (Pap test).  For women ages 21-65, health care providers may recommend a pelvic exam and a Pap test every three years. For women ages 21-65, they may recommend the Pap test and pelvic exam, combined with testing for human papilloma virus (HPV), every five years. Some types of HPV increase your risk of cervical cancer. Testing for HPV may also be done on women of any age who have unclear Pap test results.  Other health care providers may not recommend any screening for nonpregnant women who are considered low risk for pelvic cancer and have no symptoms. Ask your health care provider if a screening pelvic exam is right for you.  If you have had past treatment for cervical cancer or a condition that could lead to cancer, you need Pap tests and screening for cancer for at least 20 years after your treatment. If Pap tests have been discontinued for you, your risk factors (such as having a new sexual partner) need to be reassessed to determine if you should start having screenings again. Some women have medical problems that increase the chance of getting cervical cancer. In these cases, your health care provider may recommend that you have screening and Pap tests more often.  If you have a family history of uterine cancer or ovarian cancer, talk with your health care provider about genetic screening.  If you have  vaginal bleeding after reaching menopause, tell your health care provider.  There are currently no reliable tests available to screen for ovarian cancer.  Lung Cancer Lung cancer screening is recommended for adults 61-65 years old who are at high risk for lung cancer because of a history of smoking. A yearly low-dose CT scan of the lungs is recommended if you:  Currently smoke.  Have a history of at least 30 pack-years of smoking and you currently smoke or have quit within the past 15 years. A pack-year is smoking an average of one pack of cigarettes per day for one year.  Yearly screening should:  Continue until it has been 15 years since you quit.  Stop if you develop a health problem that would prevent you from having lung cancer treatment.  Colorectal Cancer  This type of cancer can be detected and can often be prevented.  Routine colorectal cancer screening usually begins at age 12 and continues through age 15.  If you have risk factors for colon cancer, your health care provider may recommend that you be screened at an earlier age.  If you have a family history of colorectal cancer, talk with your health care provider about genetic screening.  Your health care provider may also recommend using home test kits to check for hidden blood in your stool.  A small camera at the end of a tube can be used to examine your colon directly (sigmoidoscopy or colonoscopy). This is done to check for the earliest forms of colorectal cancer.  Direct examination of the colon should be repeated every 5-10 years until age 97. However, if early forms of precancerous polyps or small growths are found or if you have a family history or genetic risk for colorectal cancer, you may need to be screened more often.  Skin Cancer  Check your skin from head to toe regularly.  Monitor any moles. Be sure to tell your health care provider: ? About any new moles or changes in moles, especially if there is a  change in a mole's shape or color. ? If you have a mole that is larger than the size of a pencil eraser.  If any of your family members has a history of skin cancer, especially at a young age, talk with your health care provider about genetic screening.  Always use sunscreen. Apply sunscreen liberally and repeatedly throughout the day.  Whenever you are outside, protect yourself by wearing long sleeves, pants, a wide-brimmed hat, and sunglasses.  What should I know about osteoporosis? Osteoporosis is a condition in which bone destruction happens more quickly than new bone creation. After menopause, you may be at an increased risk for osteoporosis. To help prevent osteoporosis or the bone fractures that can happen because of osteoporosis, the following is recommended:  If you are 57-30 years old, get at least 1,000 mg of calcium and at least 600 mg of vitamin D per day.  If you are older than age 74 but younger than age 42, get at least 1,200 mg of calcium and at least 600 mg of vitamin D per day.  If you are older than age 67, get at least 1,200 mg of calcium and at least 800 mg of vitamin D per day.  Smoking and excessive alcohol intake increase the risk of osteoporosis. Eat foods that are rich in calcium and vitamin D, and do weight-bearing exercises several times each week as directed by your health care provider. What should I know about how menopause affects my mental health? Depression may occur at any age, but it is more common as you become older. Common symptoms of depression include:  Low or sad mood.  Changes in sleep patterns.  Changes in appetite or eating patterns.  Feeling an overall lack of motivation or enjoyment of activities that you previously enjoyed.  Frequent crying spells.  Talk with your health care provider if you think that you are experiencing depression. What should I know about immunizations? It is important that you get and maintain your immunizations.  These include:  Tetanus, diphtheria, and pertussis (Tdap) booster vaccine.  Influenza  every year before the flu season begins.  Pneumonia vaccine.  Shingles vaccine.  Your health care provider may also recommend other immunizations. This information is not intended to replace advice given to you by your health care provider. Make sure you discuss any questions you have with your health care provider. Document Released: 01/07/2006 Document Revised: 06/04/2016 Document Reviewed: 08/19/2015 Elsevier Interactive Patient Education  2018 Elsevier Inc.  

## 2018-10-11 NOTE — Assessment & Plan Note (Signed)
Will keep BP and cholesterol under good control. Continue diet and exercise. Call with any concerns.  °

## 2018-10-11 NOTE — Assessment & Plan Note (Addendum)
Intolerant to statins. Refuses them. Discussed repatha- pending results, she will consider it. Continue red yeast rice. Continue to monitor. Labs drawn today.

## 2018-10-11 NOTE — Assessment & Plan Note (Signed)
Work on diet and exercise with weight loss goal of 1-2lbs/week. Call with any concerns.

## 2018-10-11 NOTE — Assessment & Plan Note (Signed)
Checking labs today. Await results. Call with any concerns.  

## 2018-10-11 NOTE — Assessment & Plan Note (Signed)
Stable. Checking labs today. Await results. Treat as needed. Call with any concerns.  

## 2018-10-11 NOTE — Assessment & Plan Note (Signed)
x2- will try to keep BP and cholesterol under good control. Continue to monitor. Call with any concerns.

## 2018-10-11 NOTE — Progress Notes (Signed)
BP 123/80 (BP Location: Left Arm, Patient Position: Sitting, Cuff Size: Large)   Pulse 74   Temp 98.4 F (36.9 C)   Ht 4' 11.5" (1.511 m)   Wt 190 lb 2 oz (86.2 kg)   SpO2 99%   BMI 37.76 kg/m    Subjective:    Patient ID: Sarah Freeman, female    DOB: 1931/01/08, 84 y.o.   MRN: [id number]  HPI: Sarah Freeman is a 84 y.o. female presenting on 10/11/2018 for comprehensive medical examination. Current medical complaints include:  Has had recurrent stroke-  watershed 2017 and repeat 12/2017  MRI>> R parietal infarct. She is intolerant of statins. Has had muscle aches on statins and zetia- refuses both, just taking red yeast rice now, only thing she will take.   HYPOTHYROIDISM Thyroid control status:stable Satisfied with current treatment? yes Medication side effects: no Medication compliance: excellent compliance Recent dose adjustment:no Fatigue: no Cold intolerance: no Heat intolerance: no Weight gain: yes Weight loss: no Constipation: yes Diarrhea/loose stools: no Palpitations: no Lower extremity edema: no Anxiety/depressed mood: no  Menopausal Symptoms: no- stress incontinence- better now  Depression Screen done today and results listed below:  Depression screen [facility] 2/9 10/11/2018 03/06/2018 02/22/2017 03/30/2016  Decreased Interest 0 0 0 0  Down, Depressed, Hopeless 0 0 0 0  PHQ - 2 Score 0 0 0 0  Altered sleeping 0 - 0 -  Tired, decreased energy 0 - 1 -  Change in appetite 0 - 0 -  Feeling bad or failure about yourself  0 - 0 -  Trouble concentrating 0 - 0 -  Moving slowly or fidgety/restless 0 - 0 -  Suicidal thoughts 0 - 0 -  PHQ-9 Score 0 - 1 -  Difficult doing work/chores Not difficult at all - - -    Past Medical History:  Past Medical History:  Diagnosis Date  . Hyperlipidemia   . Hypothyroid   . Stroke ([place])   . Urinary incontinence     Surgical History:  Past Surgical History:  Procedure Laterality Date  . ABDOMINAL HYSTERECTOMY     age  50  . BREAST BIOPSY Left 2000?   neg. dr. Dwyane Luo office  . CHOLECYSTECTOMY  1964  . COLONOSCOPY     Dr Nicolasa Ducking  . DILATION AND CURETTAGE OF UTERUS    . EYE SURGERY    . LOOP RECORDER INSERTION N/A 02/09/2018   Procedure: LOOP RECORDER INSERTION;  Surgeon: Deboraha Sprang, MD;  Location: [facility];  Service: Cardiovascular;  Laterality: N/A;  . SKIN CANCER EXCISION     face  . TEE WITHOUT CARDIOVERSION N/A 02/09/2018   Procedure: TRANSESOPHAGEAL ECHOCARDIOGRAM (TEE);  Surgeon: Minna Merritts, MD;  Location: [facility];  Service: Cardiovascular;  Laterality: N/A;    Medications:  Current Outpatient Medications on File Prior to Visit  Medication Sig  . acetaminophen (TYLENOL ARTHRITIS PAIN) 650 MG CR tablet Take 650 mg by mouth every 8 (eight) hours as needed for pain.  . diphenhydramine-acetaminophen (TYLENOL PM) 25-500 MG TABS tablet Take 1 tablet by mouth at bedtime as needed (for sleep).   Marland Kitchen levothyroxine (SYNTHROID, LEVOTHROID) 50 MCG tablet TAKE 1 TABLET AT BEDTIME  . psyllium (HYDROCIL/METAMUCIL) 95 % PACK Take 1 packet by mouth daily as needed for mild constipation.  . Red Yeast Rice Extract (RED YEAST RICE PO) Take 1 capsule by mouth daily.   Marland Kitchen trimethoprim (TRIMPEX) 100 MG tablet Take 1 tablet (100 mg total) by  mouth daily.   No current facility-administered medications on file prior to visit.     Allergies:  Allergies  Allergen Reactions  . Statins Other (See Comments)    Leg cramps Leg cramps  . Neomycin-Polymyxin-Gramicidin Other (See Comments)    Pt states wounds do not heal with neosporin Pt states wounds do not heal with neosporin    Social History:  Social History   Socioeconomic History  . Marital status: Widowed    Spouse name: Not on file  . Number of children: Not on file  . Years of education: Not on file  . Highest education level: Not on file  Occupational History  . Not on file  Social Needs  . Financial resource strain: Not hard at  all  . Food insecurity:    Worry: Never true    Inability: Never true  . Transportation needs:    Medical: No    Non-medical: No  Tobacco Use  . Smoking status: Never Smoker  . Smokeless tobacco: Never Used  Substance and Sexual Activity  . Alcohol use: No    Alcohol/week: 0.0 standard drinks  . Drug use: No  . Sexual activity: Never  Lifestyle  . Physical activity:    Days per week: 0 days    Minutes per session: 0 min  . Stress: Not at all  Relationships  . Social connections:    Talks on phone: More than three times a week    Gets together: More than three times a week    Attends religious service: More than 4 times per year    Active member of club or organization: No    Attends meetings of clubs or organizations: Never    Relationship status: Widowed  . Intimate partner violence:    Fear of current or ex partner: No    Emotionally abused: No    Physically abused: No    Forced sexual activity: No  Other Topics Concern  . Not on file  Social History Narrative  . Not on file   Social History   Tobacco Use  Smoking Status Never Smoker  Smokeless Tobacco Never Used   Social History   Substance and Sexual Activity  Alcohol Use No  . Alcohol/week: 0.0 standard drinks    Family History:  Family History  Problem Relation Age of Onset  . Cancer Father        prostate  . Stroke Father   . Hypertension Sister   . Hyperlipidemia Daughter   . Heart disease Son   . Diabetes Maternal Grandmother   . Heart disease Maternal Grandmother   . Heart disease Maternal Grandfather   . Heart disease Daughter   . CAD Mother   . Failure to thrive Mother   . Breast cancer Neg Hx     Past medical history, surgical history, medications, allergies, family history and social history reviewed with patient today and changes made to appropriate areas of the chart.   Review of Systems  Constitutional: Negative.   HENT: Positive for hearing loss. Negative for congestion, ear  discharge, ear pain, nosebleeds, sinus pain, sore throat and tinnitus.   Eyes: Negative.   Respiratory: Negative.  Negative for stridor.   Cardiovascular: Negative.   Gastrointestinal: Positive for constipation. Negative for abdominal pain, blood in stool, diarrhea, heartburn, melena, nausea and vomiting.  Genitourinary: Negative.   Musculoskeletal: Negative.   Skin: Negative.   Neurological: Negative.   Endo/Heme/Allergies: Negative for environmental allergies and polydipsia. Bruises/bleeds easily.  Psychiatric/Behavioral: Negative.     All other ROS negative except what is listed above and in the HPI.      Objective:    BP 123/80 (BP Location: Left Arm, Patient Position: Sitting, Cuff Size: Large)   Pulse 74   Temp 98.4 F (36.9 C)   Ht 4' 11.5" (1.511 m)   Wt 190 lb 2 oz (86.2 kg)   SpO2 99%   BMI 37.76 kg/m   Wt Readings from Last 3 Encounters:  10/11/18 190 lb 2 oz (86.2 kg)  08/15/18 186 lb 12 oz (84.7 kg)  08/15/18 186 lb 12 oz (84.7 kg)    Physical Exam  Constitutional: She is oriented to person, place, and time. She appears well-developed and well-nourished. No distress.  HENT:  Head: Normocephalic and atraumatic.  Right Ear: Hearing, tympanic membrane, external ear and ear canal normal.  Left Ear: Hearing, tympanic membrane, external ear and ear canal normal.  Nose: Nose normal.  Mouth/Throat: Uvula is midline, oropharynx is clear and moist and mucous membranes are normal. No oropharyngeal exudate.  Eyes: Pupils are equal, round, and reactive to light. Conjunctivae, EOM and lids are normal. Right eye exhibits no discharge. Left eye exhibits no discharge. No scleral icterus.  Neck: Normal range of motion. Neck supple. No JVD present. No tracheal deviation present. No thyromegaly present.  Cardiovascular: Normal rate, regular rhythm, normal heart sounds and intact distal pulses. Exam reveals no gallop and no friction rub.  No murmur heard. Pulmonary/Chest: Effort  normal and breath sounds normal. No stridor. No respiratory distress. She has no wheezes. She has no rales. She exhibits no tenderness.  Abdominal: Soft. Bowel sounds are normal. She exhibits no distension and no mass. There is no tenderness. There is no rebound and no guarding. No hernia.  Genitourinary:  Genitourinary Comments: Breast and pelvic exams deferred with shared decision making  Musculoskeletal: Normal range of motion. She exhibits no edema, tenderness or deformity.  Lymphadenopathy:    She has no cervical adenopathy.  Neurological: She is alert and oriented to person, place, and time. She displays normal reflexes. No cranial nerve deficit or sensory deficit. She exhibits normal muscle tone. Coordination normal.  Skin: Skin is warm, dry and intact. Capillary refill takes less than 2 seconds. No rash noted. She is not diaphoretic. No erythema. No pallor.  Psychiatric: She has a normal mood and affect. Her speech is normal and behavior is normal. Judgment and thought content normal. Cognition and memory are normal.  Nursing note and vitals reviewed.   Results for orders placed or performed in visit on 08/23/18  CUP PACEART REMOTE DEVICE CHECK  Result Value Ref Range   Date Time Interrogation Session [id number]    Pulse Generator Manufacturer [employer]    Pulse Gen Model [id number] Reveal LINQ    Pulse Gen Serial Number [id number] [facility]    Implantable Pulse Generator Type ICM/ILR    Implantable Pulse Generator Implant Date [id number]       Assessment & Plan:   Problem List Items Addressed This Visit      Cardiovascular and Mediastinum   Atherosclerosis    Will keep BP and cholesterol under good control. Continue diet and exercise. Call with any concerns.       Relevant Orders   CBC with Differential/Platelet   Comprehensive metabolic panel   Microalbumin, Urine Waived   SVT (supraventricular tachycardia) (HCC)    Not sustained- continue to follow  with cardiology. Call with  any concerns. Continue to monitor.       HTN (hypertension)    Under good control without medicine right now. Continue to monitor. Call with any concerns.         Endocrine   Hypothyroidism    Stable. Checking labs today. Await results. Treat as needed. Call with any concerns.       Relevant Orders   CBC with Differential/Platelet   Comprehensive metabolic panel   TSH     Musculoskeletal and Integument   Osteopenia    Rechecking DEXA today- hasn't had one in 10 years.         Genitourinary   Chronic kidney disease, stage 3 ([place])    Checking labs today. Await results. Call with any concerns.       Relevant Orders   CBC with Differential/Platelet   Comprehensive metabolic panel   UA/M w/rflx Culture, Routine     Other   Hypercholesterolemia    Intolerant to statins. Refuses them. Discussed repatha- pending results, she will consider it. Continue red yeast rice. Continue to monitor. Labs drawn today.      Relevant Orders   CBC with Differential/Platelet   Comprehensive metabolic panel   Lipid Panel w/o Chol/HDL Ratio   History of stroke    x2- will try to keep BP and cholesterol under good control. Continue to monitor. Call with any concerns.       Relevant Orders   CBC with Differential/Platelet   Comprehensive metabolic panel   Microalbumin, Urine Waived   Morbid obesity ([place])    Work on diet and exercise with weight loss goal of 1-2lbs/week. Call with any concerns.        Other Visit Diagnoses    Routine general medical examination at a health care facility    -  Primary   Vaccines up to date. Screening labs checked today. Colonoscopy and mammogram N/A. DEXA ordered today. Continue diet and exercise. Call with any concerns.    Screening for osteoporosis       To get DEXA- appointment scheduled.    Relevant Orders   DG Bone Density   Immunization due       Flu shot given today.   Relevant Orders   Flu vaccine HIGH DOSE PF  (Fluzone High dose) (Completed)       Follow up plan: Return in about 6 months (around 04/11/2019) for Wellness and follow up on same day if possible.   LABORATORY TESTING:  - Pap smear: not applicable  IMMUNIZATIONS:   - Tdap: Tetanus vaccination status reviewed: last tetanus booster within 10 years. - Influenza: Administered today - Pneumovax: Up to date - Prevnar: Up to date - Zostavax vaccine: Up to date  SCREENING: -Mammogram: Not applicable  - Colonoscopy: Not applicable  - Bone Density: Ordered today   PATIENT COUNSELING:   Advised to take 1 mg of folate supplement per day if capable of pregnancy.   Sexuality: Discussed sexually transmitted diseases, partner selection, use of condoms, avoidance of unintended pregnancy  and contraceptive alternatives.   Advised to avoid cigarette smoking.  I discussed with the patient that most people either abstain from alcohol or drink within safe limits (<=14/week and <=4 drinks/occasion for males, <=7/weeks and <= 3 drinks/occasion for females) and that the risk for alcohol disorders and other health effects rises proportionally with the number of drinks per week and how often a drinker exceeds daily limits.  Discussed cessation/primary prevention of drug use and availability of treatment for  abuse.   Diet: Encouraged to adjust caloric intake to maintain  or achieve ideal body weight, to reduce intake of dietary saturated fat and total fat, to limit sodium intake by avoiding high sodium foods and not adding table salt, and to maintain adequate dietary potassium and calcium preferably from fresh fruits, vegetables, and low-fat dairy products.    stressed the importance of regular exercise  Injury prevention: Discussed safety belts, safety helmets, smoke detector, smoking near bedding or upholstery.   Dental health: Discussed importance of regular tooth brushing, flossing, and dental visits.    NEXT PREVENTATIVE PHYSICAL DUE IN 1  YEAR. Return in about 6 months (around 04/11/2019) for Wellness and follow up on same day if possible.

## 2018-10-12 LAB — COMPREHENSIVE METABOLIC PANEL
A/G RATIO: 1.7 (ref 1.2–2.2)
ALK PHOS: 48 IU/L (ref 39–117)
ALT: 12 IU/L (ref 0–32)
AST: 15 IU/L (ref 0–40)
Albumin: 4.1 g/dL (ref 3.5–4.7)
BILIRUBIN TOTAL: 0.4 mg/dL (ref 0.0–1.2)
BUN/Creatinine Ratio: 11 — ABNORMAL LOW (ref 12–28)
BUN: 10 mg/dL (ref 8–27)
CHLORIDE: 101 mmol/L (ref 96–106)
CO2: 24 mmol/L (ref 20–29)
Calcium: 9.1 mg/dL (ref 8.7–10.3)
Creatinine, Ser: 0.94 mg/dL (ref 0.57–1.00)
GFR calc Af Amer: 64 mL/min/{1.73_m2} (ref >59)
GFR calc non Af Amer: 55 mL/min/{1.73_m2} — ABNORMAL LOW (ref >59)
Globulin, Total: 2.4 g/dL (ref 1.5–4.5)
Glucose: 95 mg/dL (ref 65–99)
Potassium: 5 mmol/L (ref 3.5–5.2)
SODIUM: 138 mmol/L (ref 134–144)
Total Protein: 6.5 g/dL (ref 6.0–8.5)

## 2018-10-12 LAB — CBC WITH DIFFERENTIAL/PLATELET
BASOS: 2 %
Basophils Absolute: 0.1 10*3/uL (ref 0.0–0.2)
EOS (ABSOLUTE): 0.3 10*3/uL (ref 0.0–0.4)
Eos: 5 %
Hematocrit: 39.6 % (ref 34.0–46.6)
Hemoglobin: 13.1 g/dL (ref 11.1–15.9)
Immature Grans (Abs): 0 10*3/uL (ref 0.0–0.1)
Immature Granulocytes: 0 %
LYMPHS ABS: 3 10*3/uL (ref 0.7–3.1)
Lymphs: 44 %
MCH: 32.6 pg (ref 26.6–33.0)
MCHC: 33.1 g/dL (ref 31.5–35.7)
MCV: 99 fL — AB (ref 79–97)
MONOS ABS: 0.8 10*3/uL (ref 0.1–0.9)
Monocytes: 12 %
NEUTROS ABS: 2.5 10*3/uL (ref 1.4–7.0)
Neutrophils: 37 %
PLATELETS: 318 10*3/uL (ref 150–450)
RBC: 4.02 x10E6/uL (ref 3.77–5.28)
RDW: 12.3 % (ref 12.3–15.4)
WBC: 6.7 10*3/uL (ref 3.4–10.8)

## 2018-10-12 LAB — LIPID PANEL W/O CHOL/HDL RATIO
Cholesterol, Total: 206 mg/dL — ABNORMAL HIGH (ref 100–199)
HDL: 49 mg/dL (ref >39)
LDL Calculated: 109 mg/dL — ABNORMAL HIGH (ref 0–99)
TRIGLYCERIDES: 240 mg/dL — AB (ref 0–149)
VLDL Cholesterol Cal: 48 mg/dL — ABNORMAL HIGH (ref 5–40)

## 2018-10-12 LAB — TSH: TSH: 5.18 u[IU]/mL — ABNORMAL HIGH (ref 0.450–4.500)

## 2018-10-18 LAB — CUP PACEART REMOTE DEVICE CHECK
Date Time Interrogation Session: 20191028143740
MDC IDC PG IMPLANT DT: 20190314

## 2018-10-23 ENCOUNTER — Encounter: Payer: Self-pay | Admitting: Family Medicine

## 2018-10-30 ENCOUNTER — Ambulatory Visit (INDEPENDENT_AMBULATORY_CARE_PROVIDER_SITE_OTHER): Payer: Medicare Other

## 2018-10-30 DIAGNOSIS — I639 Cerebral infarction, unspecified: Secondary | ICD-10-CM | POA: Diagnosis not present

## 2018-10-30 NOTE — Progress Notes (Signed)
Carelink Summary Report / Loop Recorder 

## 2018-11-11 IMAGING — CT CT HEAD W/O CM
3 series · 16 of 47 positions shown, 19 images · non-contrast
Comparison: Brain MRI 02/16/2016 and head CT scan 02/15/2016.

CLINICAL DATA: Left arm numbness since waking this morning.

EXAM:
CT HEAD WITHOUT CONTRAST
TECHNIQUE: Contiguous axial images were obtained from the base of the skull
through the vertex without intravenous contrast.

[Series 3: head wo · axial · 0.41mm/px · z∈[+413,+543]mm · 10 of 32 slices shown, 13 images]
[im 3/32  brain]
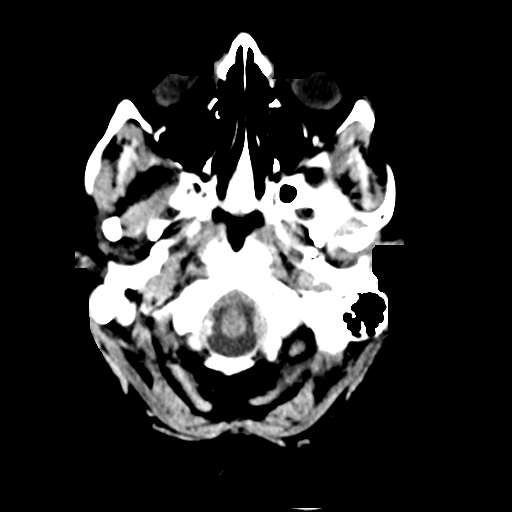
[im 3/32  bone]
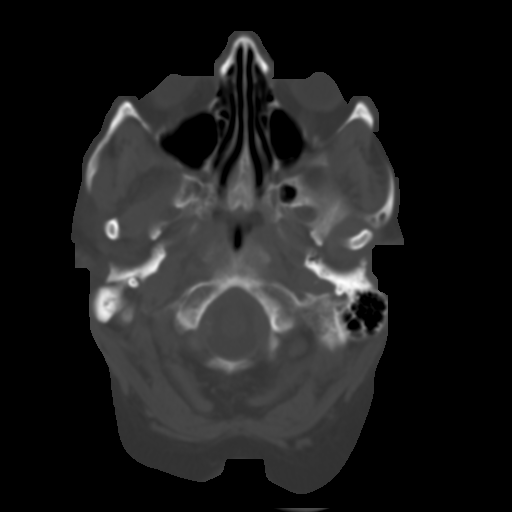
[im 6/32  brain]
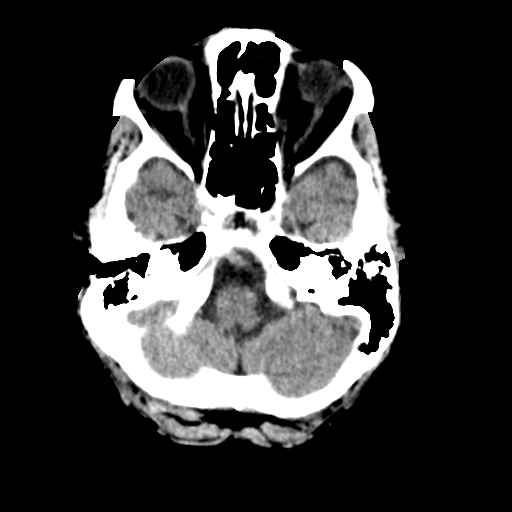
[im 9/32  brain]
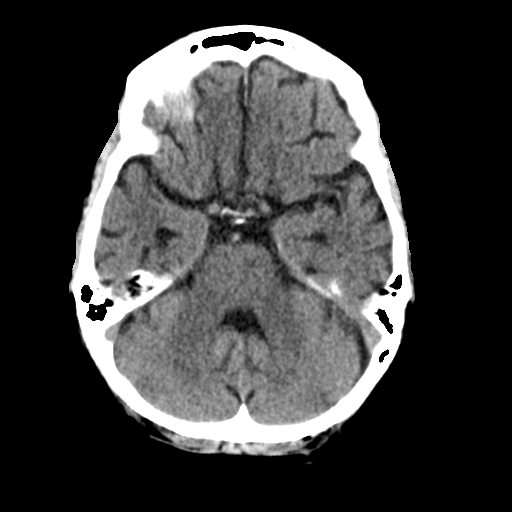
[im 11/32  brain]
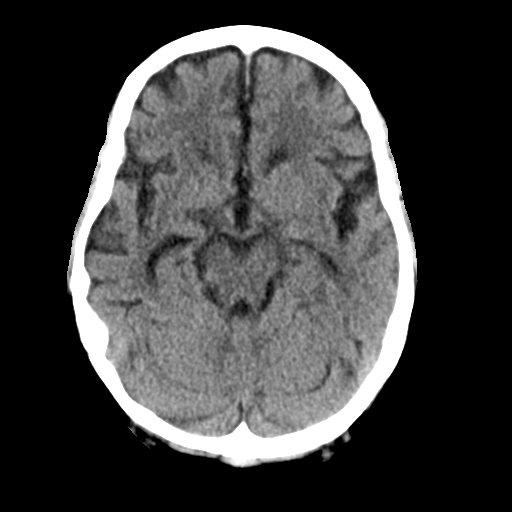
[im 14/32  brain]
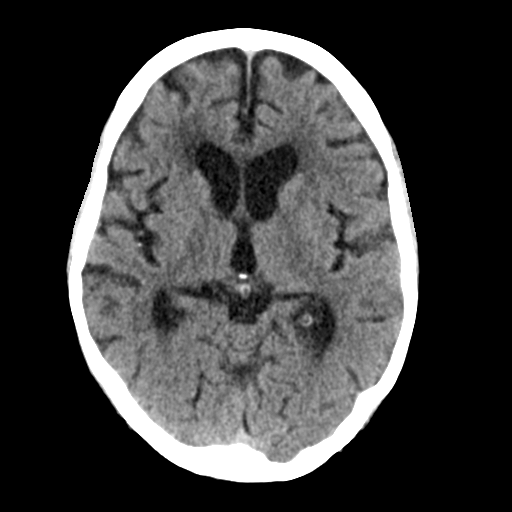
[im 14/32  bone]
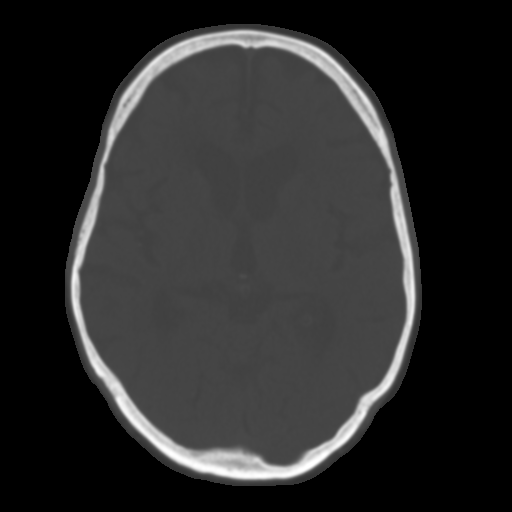
[im 18/32  brain]
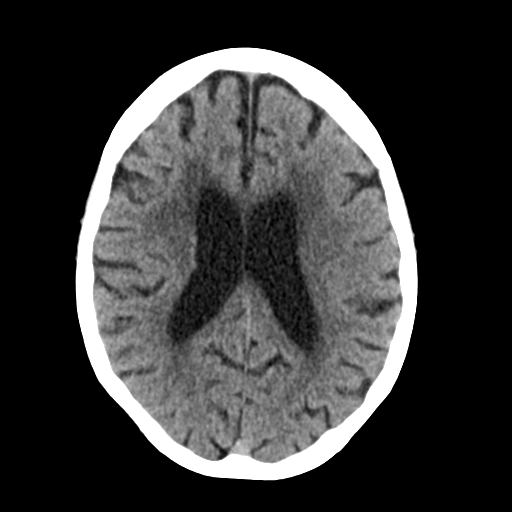
[im 21/32  brain]
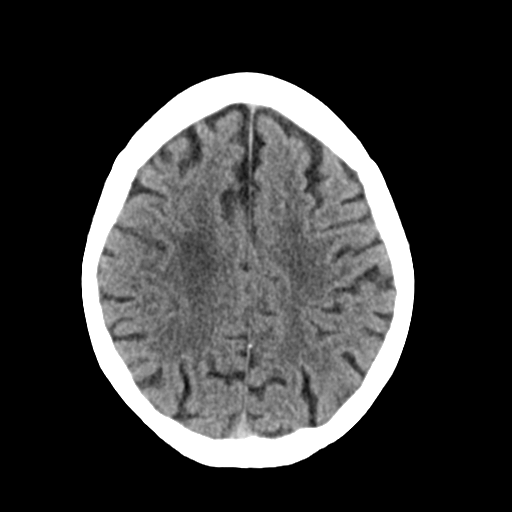
[im 24/32  brain]
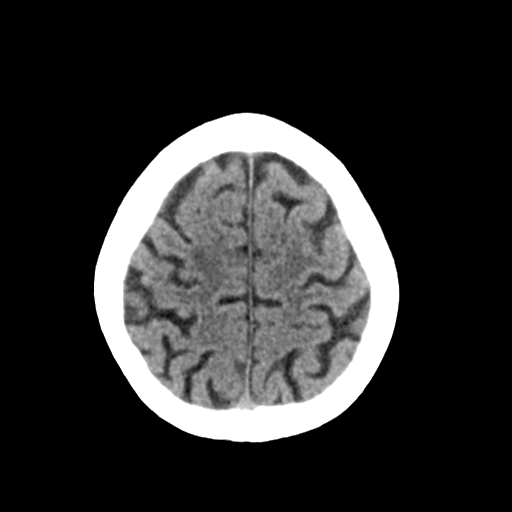
[im 26/32  brain]
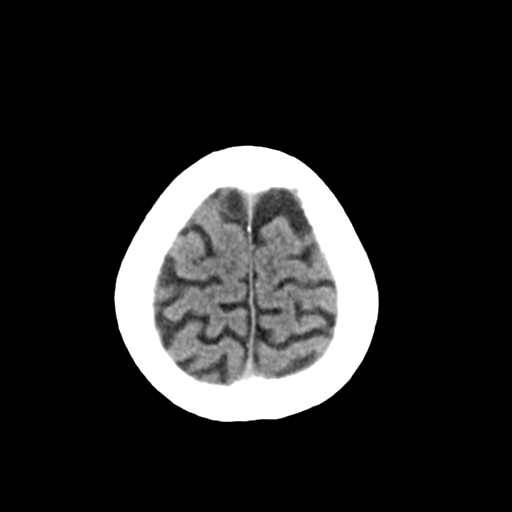
[im 26/32  bone]
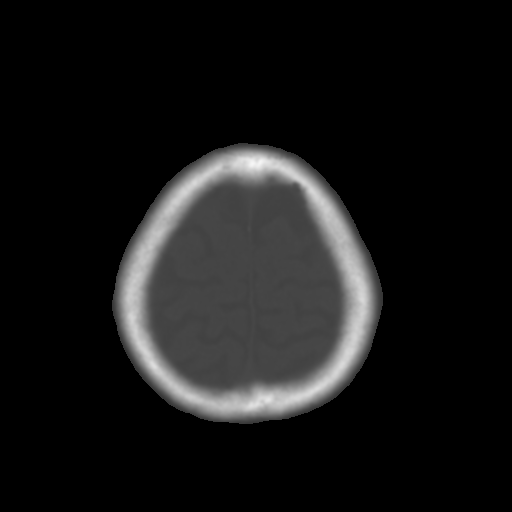
[im 29/32  brain]
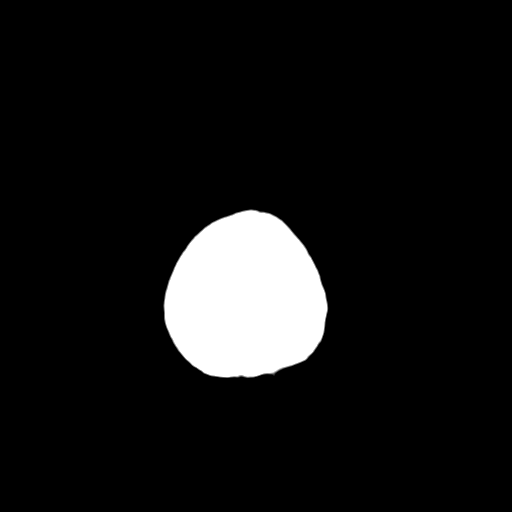

[Series 4: coronal soft tissue · coronal · 0.33mm/px · 3 of 67 slices shown]
[im 23/67  brain]
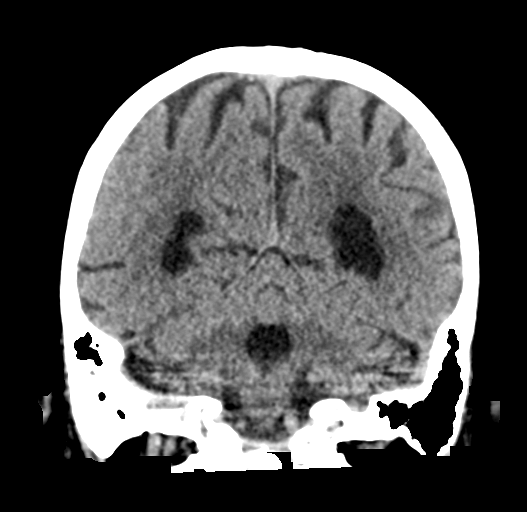
[im 30/67  brain]
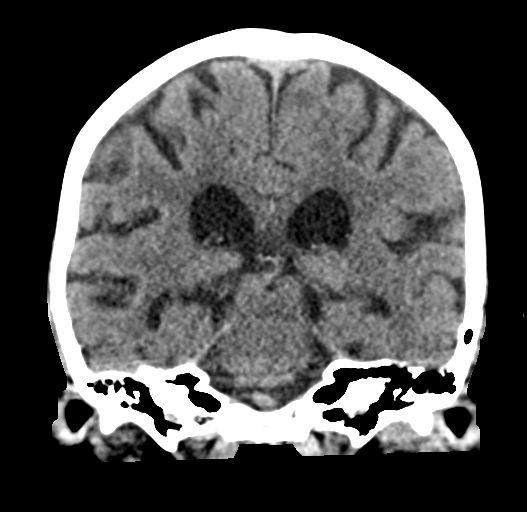
[im 37/67  brain]
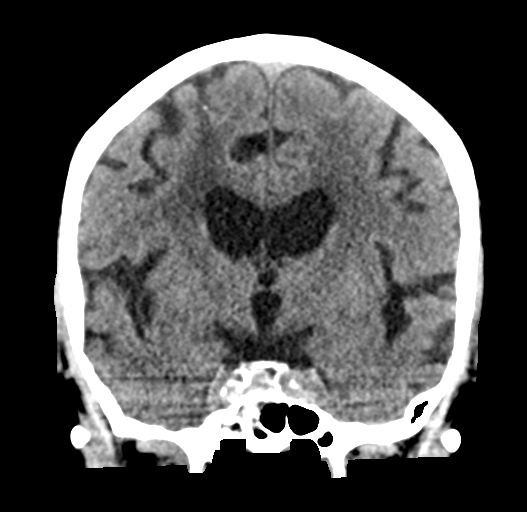

[Series 5: sagittal soft tissue · sagittal · 0.31mm/px · 3 of 54 slices shown]
[im 18/54  brain]
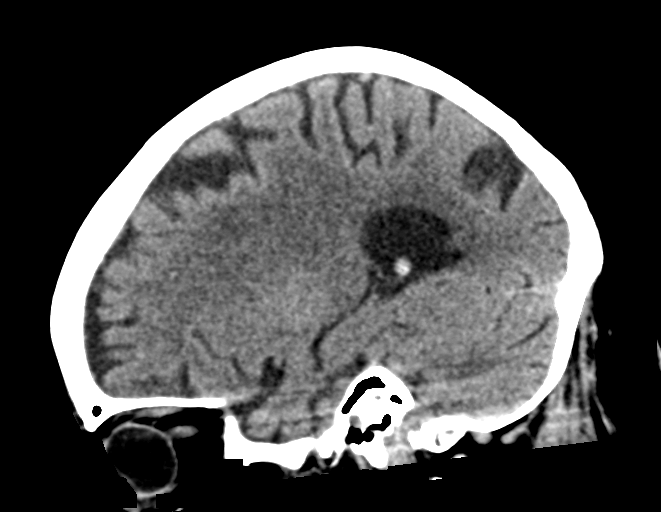
[im 27/54  brain]
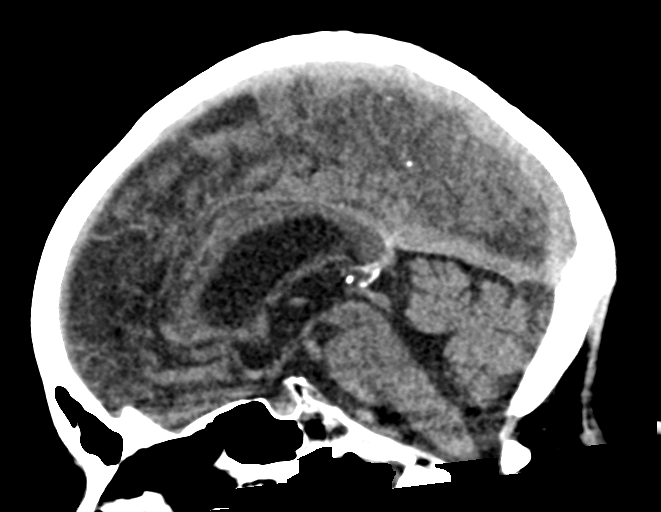
[im 36/54  brain]
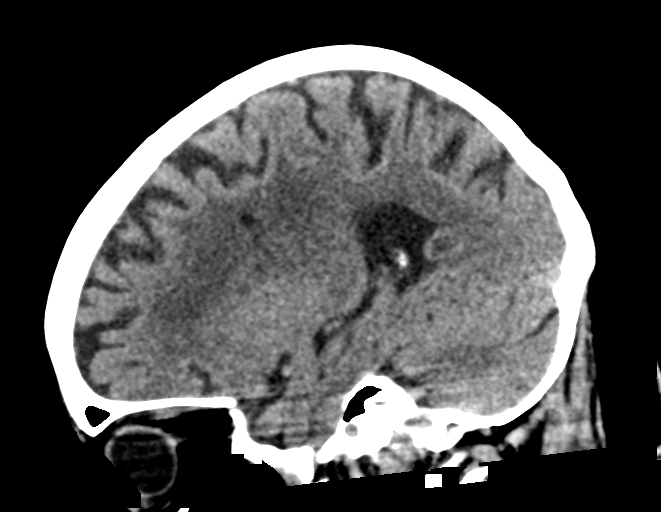

[16 of 47 positions shown; findings below may reference images not displayed]

FINDINGS: Brain: There is atrophy and chronic microvascular ischemic change.
No evidence of acute abnormality including hemorrhage, infarct, mass
lesion, mass effect, midline shift or abnormal extra-axial fluid
collection. No hydrocephalus or pneumocephalus.

Vascular: Atherosclerosis noted.

Skull: Intact.

Sinuses/Orbits: Small amount of fluid right mastoid air cells noted.

Other: None.
IMPRESSION: No acute abnormality.

Atrophy and chronic microvascular ischemic change.

Atherosclerosis.

Small right mastoid effusion.

## 2018-11-13 ENCOUNTER — Other Ambulatory Visit: Payer: Medicare Other

## 2018-11-27 LAB — CUP PACEART REMOTE DEVICE CHECK
MDC IDC PG IMPLANT DT: 20190314
MDC IDC SESS DTM: 20191130151010

## 2018-11-30 ENCOUNTER — Other Ambulatory Visit: Payer: Self-pay | Admitting: Family Medicine

## 2018-11-30 ENCOUNTER — Ambulatory Visit (INDEPENDENT_AMBULATORY_CARE_PROVIDER_SITE_OTHER): Payer: Medicare Other

## 2018-11-30 DIAGNOSIS — I639 Cerebral infarction, unspecified: Secondary | ICD-10-CM | POA: Diagnosis not present

## 2018-11-30 LAB — CUP PACEART REMOTE DEVICE CHECK
Implantable Pulse Generator Implant Date: 20190314
MDC IDC SESS DTM: 20200102164101

## 2018-11-30 NOTE — Telephone Encounter (Signed)
Requested Prescriptions  Pending Prescriptions Disp Refills  . levothyroxine (SYNTHROID, LEVOTHROID) 50 MCG tablet [Pharmacy Med Name: L-THYROXINE (SYNTHROID) TABS 50MCG] 90 tablet 1    Sig: TAKE 1 TABLET AT BEDTIME     Endocrinology:  Hypothyroid Agents Failed - 11/30/2018 12:24 AM      Failed - TSH needs to be rechecked within 3 months after an abnormal result. Refill until TSH is due.      Failed - TSH in normal range and within 360 days    TSH  Date Value Ref Range Status  10/11/2018 5.180 (H) 0.450 - 4.500 uIU/mL Final         Passed - Valid encounter within last 12 months    Recent Outpatient Visits          1 month ago Routine general medical examination at a health care facility   [facility] [place] P, DO   4 months ago Leg pain, bilateral   [place], [place], [place]   6 months ago Cough   [place], NP   10 months ago Cerebrovascular accident (CVA) due to thrombosis of right middle cerebral artery ([place])   [facility] Haddock, NP   1 year ago Need for influenza vaccination   [facility] Kathrine Haddock, NP      Future Appointments            In 5 months Wynetta Emery, Barb Merino, DO [employer], [place]   In 52 months MacDiarmid, Nicki Reaper, [place]

## 2018-12-01 NOTE — Progress Notes (Signed)
Carelink Summary Report / Loop Recorder 

## 2018-12-19 ENCOUNTER — Encounter: Payer: Self-pay | Admitting: Family Medicine

## 2018-12-19 ENCOUNTER — Ambulatory Visit (INDEPENDENT_AMBULATORY_CARE_PROVIDER_SITE_OTHER): Payer: Medicare Other | Admitting: Family Medicine

## 2018-12-19 VITALS — BP 117/73 | HR 75 | Temp 98.1°F | Ht 59.0 in | Wt 189.5 lb

## 2018-12-19 DIAGNOSIS — J069 Acute upper respiratory infection, unspecified: Secondary | ICD-10-CM | POA: Diagnosis not present

## 2018-12-19 MED ORDER — BENZONATATE 200 MG PO CAPS: 200.0000 mg | ORAL_CAPSULE | Freq: Three times a day (TID) | ORAL | 0 refills | Status: DC | PRN

## 2018-12-19 MED ORDER — AZITHROMYCIN 250 MG PO TABS: ORAL_TABLET | ORAL | 0 refills | Status: DC

## 2018-12-19 NOTE — Progress Notes (Signed)
BP 117/73 (BP Location: Left Arm, Patient Position: Sitting, Cuff Size: Normal)   Pulse 75   Temp 98.1 F (36.7 C) (Oral)   Ht 4\' 11"  (1.499 m)   Wt 189 lb 8 oz (86 kg)   SpO2 98%   BMI 38.27 kg/m    Subjective:    Patient ID: Sarah Freeman, female    DOB: 26-Apr-1931, 85 y.o.   MRN: [id number]  HPI: Sarah Freeman is a 85 y.o. female  Chief Complaint  Patient presents with  . Cough    Ongoing one week. Will cough to the point of urinating.   . Wheezing   1.5 weeks of hacking cough, fatigue, congestion, wheezing, rattling in chest when laying flat. Coughing so hard she is having urinary incontinence. No fevers, chills, SOB, body aches. Was taking tylenol and aspirin as well as vicks vapor rub with minimal relief. Several sick contacts. No hx of pulmonary dz.   Relevant past medical, surgical, family and social history reviewed and updated as indicated. Interim medical history since our last visit reviewed. Allergies and medications reviewed and updated.  Review of Systems  Per HPI unless specifically indicated above     Objective:    BP 117/73 (BP Location: Left Arm, Patient Position: Sitting, Cuff Size: Normal)   Pulse 75   Temp 98.1 F (36.7 C) (Oral)   Ht 4\' 11"  (1.499 m)   Wt 189 lb 8 oz (86 kg)   SpO2 98%   BMI 38.27 kg/m   Wt Readings from Last 3 Encounters:  12/19/18 189 lb 8 oz (86 kg)  10/11/18 190 lb 2 oz (86.2 kg)  08/15/18 186 lb 12 oz (84.7 kg)    Physical Exam Vitals signs and nursing note reviewed.  Constitutional:      Appearance: Normal appearance.  HENT:     Head: Atraumatic.     Right Ear: Tympanic membrane and external ear normal.     Left Ear: Tympanic membrane and external ear normal.     Nose: Congestion present.     Mouth/Throat:     Mouth: Mucous membranes are moist.     Pharynx: Posterior oropharyngeal erythema present.  Eyes:     Extraocular Movements: Extraocular movements intact.     Conjunctiva/sclera: Conjunctivae  normal.  Neck:     Musculoskeletal: Normal range of motion and neck supple.  Cardiovascular:     Rate and Rhythm: Normal rate and regular rhythm.     Heart sounds: Normal heart sounds.  Pulmonary:     Effort: Pulmonary effort is normal.     Breath sounds: Normal breath sounds. No wheezing or rales.  Musculoskeletal: Normal range of motion.  Skin:    General: Skin is warm and dry.  Neurological:     Mental Status: She is alert and oriented to person, place, and time.  Psychiatric:        Mood and Affect: Mood normal.        Thought Content: Thought content normal.     Results for orders placed or performed in visit on 11/30/18  CUP [place]  Result Value Ref Range   Date Time Interrogation Session [id number]    Pulse Generator Manufacturer [facility]    Pulse Gen Model [id number] Reveal LINQ    Pulse Gen Serial Number [id number]    Clinic Name [facility]    Implantable Pulse Generator Type ICM/ILR    Implantable Pulse Generator Implant Date [id number]    Eval  Rhythm NSR       Assessment & Plan:   Problem List Items Addressed This Visit    None    Visit Diagnoses    Upper respiratory tract infection, unspecified type    -  Primary   Tx with zpak, tessalon perles, and continued supportive home care. F/u if worsening or not improved by end of week   Relevant Medications   azithromycin (ZITHROMAX) 250 MG tablet       Follow up plan: Return if symptoms worsen or fail to improve.

## 2018-12-21 ENCOUNTER — Telehealth: Payer: Self-pay | Admitting: Family Medicine

## 2018-12-21 ENCOUNTER — Ambulatory Visit
Admission: RE | Admit: 2018-12-21 | Discharge: 2018-12-21 | Disposition: A | Discharge status: Home / Self Care | Payer: Medicare Other | Source: Ambulatory Visit | Attending: Family Medicine | Admitting: Family Medicine

## 2018-12-21 DIAGNOSIS — R058 Other specified cough: Secondary | ICD-10-CM

## 2018-12-21 DIAGNOSIS — R05 Cough: Secondary | ICD-10-CM | POA: Diagnosis present

## 2018-12-21 MED ORDER — PREDNISONE 20 MG PO TABS: 40.0000 mg | ORAL_TABLET | Freq: Every day | ORAL | 0 refills | Status: DC

## 2018-12-21 MED ORDER — DOXYCYCLINE HYCLATE 100 MG PO TABS: 100.0000 mg | ORAL_TABLET | Freq: Two times a day (BID) | ORAL | 0 refills | Status: DC

## 2018-12-21 NOTE — Telephone Encounter (Signed)
Attempted to reach patient. Line was answered, background noise was heard. Repeatedly announced my presence w/o any response. Line was d/c. Will attempt again later.

## 2018-12-21 NOTE — Telephone Encounter (Signed)
Pt's daughter returning call to office with mother present at the time of the call. Notes per Rachel,PA on 12/21/18 relayed to pt's daughter. Understanding verbalized.

## 2018-12-21 NOTE — Telephone Encounter (Signed)
Please let her know that her x-ray was negative for pneumonia and just showed bronchitis. I'm going to change her over to doxycycline and add some prednisone since she's not improving symptomatically. She can stop the azithromycin.

## 2018-12-21 NOTE — Telephone Encounter (Signed)
Pt no better after starting zpak and tessalon perles. Will obtain CXR today and adjust as needed  Copied from [place] #[id number]. Topic: General - Inquiry >> Dec 21, 2018  8:24 AM Conception Chancy, NT wrote: Reason for CRM: patient is calling and states that Merrie Roof told her to call and speak with her today if she is not better. She states she is not better and is needing the xray done that was mentioned. Also requesting to speak with her. >> Dec 21, 2018  8:48 AM Don Perking M wrote: Pt has called 3 times this morning since 8 o clock and would like a call back as soon as possible.

## 2019-01-02 ENCOUNTER — Ambulatory Visit (INDEPENDENT_AMBULATORY_CARE_PROVIDER_SITE_OTHER): Payer: Medicare Other

## 2019-01-02 DIAGNOSIS — I639 Cerebral infarction, unspecified: Secondary | ICD-10-CM | POA: Diagnosis not present

## 2019-01-04 LAB — CUP PACEART REMOTE DEVICE CHECK
Date Time Interrogation Session: 20200204164105
MDC IDC PG IMPLANT DT: 20190314

## 2019-01-11 NOTE — Progress Notes (Signed)
Carelink Summary Report / Loop Recorder 

## 2019-02-05 ENCOUNTER — Ambulatory Visit (INDEPENDENT_AMBULATORY_CARE_PROVIDER_SITE_OTHER): Payer: Medicare Other | Admitting: *Deleted

## 2019-02-05 DIAGNOSIS — I639 Cerebral infarction, unspecified: Secondary | ICD-10-CM

## 2019-02-06 LAB — CUP PACEART REMOTE DEVICE CHECK
MDC IDC PG IMPLANT DT: 20190314
MDC IDC SESS DTM: 20200308163844

## 2019-02-13 NOTE — Progress Notes (Signed)
Carelink Summary Report / Loop Recorder 

## 2019-03-05 ENCOUNTER — Ambulatory Visit: Payer: Medicare Other

## 2019-03-09 ENCOUNTER — Ambulatory Visit (INDEPENDENT_AMBULATORY_CARE_PROVIDER_SITE_OTHER): Payer: Medicare Other | Admitting: *Deleted

## 2019-03-09 ENCOUNTER — Other Ambulatory Visit: Payer: Self-pay

## 2019-03-09 DIAGNOSIS — I639 Cerebral infarction, unspecified: Secondary | ICD-10-CM

## 2019-03-10 LAB — CUP PACEART REMOTE DEVICE CHECK
Date Time Interrogation Session: 20200410173933
Implantable Pulse Generator Implant Date: 20190314

## 2019-03-19 NOTE — Progress Notes (Signed)
Carelink Summary Report / Loop Recorder 

## 2019-04-02 ENCOUNTER — Telehealth: Payer: Self-pay | Admitting: Urology

## 2019-04-02 NOTE — Telephone Encounter (Signed)
Spoke with patient she was confused and was calling in regards to her cardiac pacemaker remote and was told to call Dr. Rockey Situ with Cardiology for further instruction

## 2019-04-02 NOTE — Telephone Encounter (Signed)
Patient is having trouble with her [employer]tron[employer] device.  She does not think it is working.  She can be reached at [phone].

## 2019-04-08 ENCOUNTER — Other Ambulatory Visit: Payer: Self-pay | Admitting: Family Medicine

## 2019-04-09 NOTE — Telephone Encounter (Signed)
For abx would need appointment if feels she is sick.  She could do virtual appointment.

## 2019-04-11 ENCOUNTER — Ambulatory Visit (INDEPENDENT_AMBULATORY_CARE_PROVIDER_SITE_OTHER): Payer: Medicare Other | Admitting: *Deleted

## 2019-04-11 ENCOUNTER — Other Ambulatory Visit: Payer: Self-pay

## 2019-04-11 DIAGNOSIS — I639 Cerebral infarction, unspecified: Secondary | ICD-10-CM | POA: Diagnosis not present

## 2019-04-12 LAB — CUP PACEART REMOTE DEVICE CHECK
Date Time Interrogation Session: 20200513181230
Implantable Pulse Generator Implant Date: 20190314

## 2019-04-30 NOTE — Progress Notes (Signed)
Carelink Summary Report / Loop Recorder 

## 2019-05-05 ENCOUNTER — Other Ambulatory Visit: Payer: Self-pay | Admitting: Family Medicine

## 2019-05-05 NOTE — Telephone Encounter (Signed)
Requested Prescriptions  Pending Prescriptions Disp Refills  . clopidogrel (PLAVIX) 75 MG tablet [Pharmacy Med Name: CLOPIDOGREL BISULFATE TABS 75MG ] 90 tablet 3    Sig: TAKE 1 TABLET DAILY     Hematology: Antiplatelets - clopidogrel Failed - 05/05/2019  1:17 AM      Failed - Evaluate AST, ALT within 2 months of therapy initiation.      Failed - HCT in normal range and within 180 days    Hematocrit  Date Value Ref Range Status  10/11/2018 39.6 34.0 - 46.6 % Final         Failed - HGB in normal range and within 180 days    Hemoglobin  Date Value Ref Range Status  10/11/2018 13.1 11.1 - 15.9 g/dL Final         Failed - PLT in normal range and within 180 days    Platelets  Date Value Ref Range Status  10/11/2018 318 150 - 450 x10E3/uL Final         Passed - ALT in normal range and within 360 days    ALT  Date Value Ref Range Status  10/11/2018 12 0 - 32 IU/L Final         Passed - AST in normal range and within 360 days    AST  Date Value Ref Range Status  10/11/2018 15 0 - 40 IU/L Final         Passed - Valid encounter within last 6 months    Recent Outpatient Visits          4 months ago Upper respiratory tract infection, unspecified type   [facility], [place]   6 months ago Routine general medical examination at a health care facility   [facility], [place] P, DO   9 months ago Leg pain, bilateral   [place], [place], [place]   11 months ago Cough   [facility] [place] Kathrine Haddock, NP   1 year ago Cerebrovascular accident (CVA) due to thrombosis of right middle cerebral artery ([place])   [place] Kathrine Haddock, NP      Future Appointments            In 5 days Wynetta Emery, Barb Merino, DO [place], [place]   In 5 days  [employer] [facility], [place]   In 1 month MacDiarmid, Nicki Reaper, MD [facility]         . esomeprazole (NEXIUM) 40 MG  capsule [Pharmacy Med Name: ESOMEPRAZOLE MAG DR CAPS 40MG ] 90 capsule 3    Sig: TAKE 1 CAPSULE DAILY     Gastroenterology: Proton Pump Inhibitors Passed - 05/05/2019  1:17 AM      Passed - Valid encounter within last 12 months    Recent Outpatient Visits          4 months ago Upper respiratory tract infection, unspecified type   [facility], [place]   6 months ago Routine general medical examination at a health care facility   [facility], [place] P, DO   9 months ago Leg pain, bilateral   [place], [place], [place]   11 months ago Cough   [facility] Kathrine Haddock, NP   1 year ago Cerebrovascular accident (CVA) due to thrombosis of right middle cerebral artery ([place])   [place] Kathrine Haddock, NP      Future Appointments  In 5 days Wynetta Emery, Barb Merino, DO [place], [place]   In 5 days  [employer], [place]   In 37 month MacDiarmid, Nicki Reaper, [place]

## 2019-05-10 ENCOUNTER — Ambulatory Visit: Payer: Medicare Other

## 2019-05-10 ENCOUNTER — Other Ambulatory Visit: Payer: Self-pay

## 2019-05-10 ENCOUNTER — Ambulatory Visit: Payer: Medicare Other | Admitting: Family Medicine

## 2019-05-10 ENCOUNTER — Encounter: Payer: Self-pay | Admitting: Family Medicine

## 2019-05-10 ENCOUNTER — Ambulatory Visit (INDEPENDENT_AMBULATORY_CARE_PROVIDER_SITE_OTHER): Payer: Medicare Other | Admitting: Family Medicine

## 2019-05-10 VITALS — BP 128/80 | HR 82 | Temp 98.7°F | Ht 59.0 in | Wt 198.0 lb

## 2019-05-10 DIAGNOSIS — E7800 Pure hypercholesterolemia, unspecified: Secondary | ICD-10-CM | POA: Diagnosis not present

## 2019-05-10 DIAGNOSIS — E039 Hypothyroidism, unspecified: Secondary | ICD-10-CM | POA: Diagnosis not present

## 2019-05-10 DIAGNOSIS — N183 Chronic kidney disease, stage 3 unspecified: Secondary | ICD-10-CM

## 2019-05-10 DIAGNOSIS — I10 Essential (primary) hypertension: Secondary | ICD-10-CM | POA: Diagnosis not present

## 2019-05-10 LAB — MICROALBUMIN, URINE WAIVED
Creatinine, Urine Waived: 200 mg/dL (ref 10–300)
Microalb, Ur Waived: 80 mg/L — ABNORMAL HIGH (ref 0–19)

## 2019-05-10 NOTE — Assessment & Plan Note (Signed)
Stable off medicine. Continue current regimen. Continue to monitor. Call with any concerns.

## 2019-05-10 NOTE — Assessment & Plan Note (Signed)
Stable off medicine. Continue current regimen. Continue to monitor. Call with any concerns. Rechecking levels today. Await results.

## 2019-05-10 NOTE — Assessment & Plan Note (Signed)
Feeling well. Will check labs and treat as needed. Call with any concerns. Continue to monitor.

## 2019-05-10 NOTE — Progress Notes (Signed)
BP 128/80   Pulse 82   Temp 98.7 F (37.1 C) (Oral)   Ht 4\' 11"  (1.499 m)   Wt 198 lb (89.8 kg)   SpO2 97%   BMI 39.99 kg/m    Subjective:    Patient ID: Sarah Freeman, female    DOB: 1931/05/01, 85 y.o.   MRN: [id number]  HPI: Sarah Freeman is a 85 y.o. female  Chief Complaint  Patient presents with  . Hypertension    64m f/u  . Hypothyroidism   HYPERTENSION / HYPERLIPIDEMIA Satisfied with current treatment? yes Duration of hypertension: chronic BP monitoring frequency: not checking BP medication side effects: no Past BP meds: not on anything Duration of hyperlipidemia: chronic Cholesterol medication side effects: yes Cholesterol supplements: mustard, red yeast rice Past cholesterol medications: several statins- myalgias Medication compliance: not on anything Aspirin: no Recent stressors: yes Recurrent headaches: no Visual changes: no Palpitations: no Dyspnea: no Chest pain: no Lower extremity edema: no Dizzy/lightheaded: no  HYPOTHYROIDISM Thyroid control status:stable Satisfied with current treatment? yes Medication side effects: no Medication compliance: good compliance Recent dose adjustment:no Fatigue: no Cold intolerance: no Heat intolerance: no Weight gain: no Weight loss: no Constipation: no Diarrhea/loose stools: no Palpitations: no Lower extremity edema: no Anxiety/depressed mood: no  Relevant past medical, surgical, family and social history reviewed and updated as indicated. Interim medical history since our last visit reviewed. Allergies and medications reviewed and updated.  Review of Systems  Constitutional: Negative.   Respiratory: Negative.   Cardiovascular: Negative.   Gastrointestinal: Negative.   Neurological: Negative.  Negative for dizziness.  Psychiatric/Behavioral: Negative.     Per HPI unless specifically indicated above     Objective:    BP 128/80   Pulse 82   Temp 98.7 F (37.1 C) (Oral)   Ht 4\' 11"   (1.499 m)   Wt 198 lb (89.8 kg)   SpO2 97%   BMI 39.99 kg/m   Wt Readings from Last 3 Encounters:  05/10/19 198 lb (89.8 kg)  12/19/18 189 lb 8 oz (86 kg)  10/11/18 190 lb 2 oz (86.2 kg)    Physical Exam Vitals signs and nursing note reviewed.  Constitutional:      General: She is not in acute distress.    Appearance: Normal appearance. She is not ill-appearing, toxic-appearing or diaphoretic.  HENT:     Head: Normocephalic and atraumatic.     Right Ear: External ear normal.     Left Ear: External ear normal.     Nose: Nose normal.     Mouth/Throat:     Mouth: Mucous membranes are moist.     Pharynx: Oropharynx is clear.  Eyes:     General: No scleral icterus.       Right eye: No discharge.        Left eye: No discharge.     Extraocular Movements: Extraocular movements intact.     Conjunctiva/sclera: Conjunctivae normal.     Pupils: Pupils are equal, round, and reactive to light.  Neck:     Musculoskeletal: Normal range of motion and neck supple.  Cardiovascular:     Rate and Rhythm: Normal rate and regular rhythm.     Pulses: Normal pulses.     Heart sounds: Normal heart sounds. No murmur. No friction rub. No gallop.   Pulmonary:     Effort: Pulmonary effort is normal. No respiratory distress.     Breath sounds: Normal breath sounds. No stridor. No wheezing, rhonchi or rales.  Chest:     Chest wall: No tenderness.  Musculoskeletal: Normal range of motion.  Skin:    General: Skin is warm and dry.     Capillary Refill: Capillary refill takes less than 2 seconds.     Coloration: Skin is not jaundiced or pale.     Findings: No bruising, erythema, lesion or rash.  Neurological:     General: No focal deficit present.     Mental Status: She is alert and oriented to person, place, and time. Mental status is at baseline.  Psychiatric:        Mood and Affect: Mood normal.        Behavior: Behavior normal.        Thought Content: Thought content normal.        Judgment:  Judgment normal.     Results for orders placed or performed in visit on 04/11/19  CUP PACEART REMOTE DEVICE CHECK  Result Value Ref Range   Date Time Interrogation Session [id number]    Pulse Generator Manufacturer MERM    Pulse Gen Model CHY85 Reveal LINQ    Pulse Gen Serial Number [id number]    Clinic Name [facility]    Implantable Pulse Generator Type ICM/ILR    Implantable Pulse Generator Implant Date [id number]       Assessment & Plan:   Problem List Items Addressed This Visit      Cardiovascular and Mediastinum   HTN (hypertension) - Primary    Stable off medicine. Continue current regimen. Continue to monitor. Call with any concerns.       Relevant Orders   CBC with Differential/Platelet   Comprehensive metabolic panel   Microalbumin, Urine Waived     Endocrine   Hypothyroidism    Feeling well. Will check labs and treat as needed. Call with any concerns. Continue to monitor.       Relevant Orders   CBC with Differential/Platelet   Comprehensive metabolic panel   TSH     Genitourinary   Chronic kidney disease, stage 3 (HCC)     Rechecking levels today. Await results.       Relevant Orders   CBC with Differential/Platelet   Comprehensive metabolic panel   Microalbumin, Urine Waived     Other   Hypercholesterolemia    Stable off medicine. Continue current regimen. Continue to monitor. Call with any concerns. Rechecking levels today. Await results.       Relevant Orders   CBC with Differential/Platelet   Comprehensive metabolic panel   Lipid Panel w/o Chol/HDL Ratio       Follow up plan: Return in about 6 months (around 11/09/2019), or Wellness/Physical/follow up.

## 2019-05-10 NOTE — Assessment & Plan Note (Signed)
Rechecking levels today. Await results.  

## 2019-05-11 ENCOUNTER — Ambulatory Visit: Payer: Medicare Other | Admitting: Family Medicine

## 2019-05-11 LAB — CBC WITH DIFFERENTIAL/PLATELET
Basophils Absolute: 0.1 10*3/uL (ref 0.0–0.2)
Basos: 2 %
EOS (ABSOLUTE): 0.3 10*3/uL (ref 0.0–0.4)
Eos: 5 %
Hematocrit: 38.2 % (ref 34.0–46.6)
Hemoglobin: 12.5 g/dL (ref 11.1–15.9)
Immature Grans (Abs): 0 10*3/uL (ref 0.0–0.1)
Immature Granulocytes: 0 %
Lymphocytes Absolute: 2.5 10*3/uL (ref 0.7–3.1)
Lymphs: 38 %
MCH: 32.4 pg (ref 26.6–33.0)
MCHC: 32.7 g/dL (ref 31.5–35.7)
MCV: 99 fL — ABNORMAL HIGH (ref 79–97)
Monocytes Absolute: 0.8 10*3/uL (ref 0.1–0.9)
Monocytes: 12 %
Neutrophils Absolute: 2.8 10*3/uL (ref 1.4–7.0)
Neutrophils: 43 %
Platelets: 287 10*3/uL (ref 150–450)
RBC: 3.86 x10E6/uL (ref 3.77–5.28)
RDW: 12.7 % (ref 11.7–15.4)
WBC: 6.5 10*3/uL (ref 3.4–10.8)

## 2019-05-11 LAB — COMPREHENSIVE METABOLIC PANEL
ALT: 14 IU/L (ref 0–32)
AST: 18 IU/L (ref 0–40)
Albumin/Globulin Ratio: 1.7 (ref 1.2–2.2)
Albumin: 4 g/dL (ref 3.6–4.6)
Alkaline Phosphatase: 45 IU/L (ref 39–117)
BUN/Creatinine Ratio: 16 (ref 12–28)
BUN: 13 mg/dL (ref 8–27)
Bilirubin Total: <0.2 mg/dL (ref 0.0–1.2)
CO2: 24 mmol/L (ref 20–29)
Calcium: 8.7 mg/dL (ref 8.7–10.3)
Chloride: 106 mmol/L (ref 96–106)
Creatinine, Ser: 0.83 mg/dL (ref 0.57–1.00)
GFR calc Af Amer: 74 mL/min/{1.73_m2} (ref >59)
GFR calc non Af Amer: 64 mL/min/{1.73_m2} (ref >59)
Globulin, Total: 2.3 g/dL (ref 1.5–4.5)
Glucose: 106 mg/dL — ABNORMAL HIGH (ref 65–99)
Potassium: 4.6 mmol/L (ref 3.5–5.2)
Sodium: 140 mmol/L (ref 134–144)
Total Protein: 6.3 g/dL (ref 6.0–8.5)

## 2019-05-11 LAB — LIPID PANEL W/O CHOL/HDL RATIO
Cholesterol, Total: 194 mg/dL (ref 100–199)
HDL: 47 mg/dL (ref >39)
LDL Calculated: 100 mg/dL — ABNORMAL HIGH (ref 0–99)
Triglycerides: 234 mg/dL — ABNORMAL HIGH (ref 0–149)
VLDL Cholesterol Cal: 47 mg/dL — ABNORMAL HIGH (ref 5–40)

## 2019-05-11 LAB — TSH: TSH: 2.38 u[IU]/mL (ref 0.450–4.500)

## 2019-05-14 ENCOUNTER — Ambulatory Visit (INDEPENDENT_AMBULATORY_CARE_PROVIDER_SITE_OTHER): Payer: Medicare Other | Admitting: *Deleted

## 2019-05-14 DIAGNOSIS — I639 Cerebral infarction, unspecified: Secondary | ICD-10-CM | POA: Diagnosis not present

## 2019-05-14 LAB — CUP PACEART REMOTE DEVICE CHECK
Date Time Interrogation Session: 20200615183530
Implantable Pulse Generator Implant Date: 20190314

## 2019-05-18 ENCOUNTER — Other Ambulatory Visit: Payer: Self-pay | Admitting: Family Medicine

## 2019-05-18 ENCOUNTER — Encounter: Payer: Self-pay | Admitting: Family Medicine

## 2019-05-18 MED ORDER — LEVOTHYROXINE SODIUM 50 MCG PO TABS
50.0000 ug | ORAL_TABLET | Freq: Every day | ORAL | 3 refills | Status: DC
Start: 2019-05-18 — End: 2020-01-04

## 2019-05-22 NOTE — Progress Notes (Signed)
Carelink Summary Report / Loop Recorder 

## 2019-05-31 ENCOUNTER — Telehealth: Payer: Self-pay | Admitting: Internal Medicine

## 2019-05-31 NOTE — Telephone Encounter (Signed)
LMOVM to call the DC

## 2019-05-31 NOTE — Telephone Encounter (Signed)
°  1. Has your device fired? Unknown   2. Is you device beeping? Yes, the time varies. Patient said it beeped on and off all night  3. Are you experiencing draining or swelling at device site? No  4. Are you calling to see if we received your device transmission? No  5. Have you passed out? No  No problems other than the beeping. Patient says she believes it is malfunctioning     Please route to [place]

## 2019-05-31 NOTE — Telephone Encounter (Signed)
I let the pt know to check her hand held to make sure it is on the base correctly. I told sometimes when it is slightly off the base it will not charge properly and beep. I told her if it is on the base correctly then to call [facility] tech support to get additional help. Pt verbalized understanding.

## 2019-06-14 ENCOUNTER — Telehealth: Payer: Self-pay | Admitting: Unknown Physician Specialty

## 2019-06-14 DIAGNOSIS — Z8709 Personal history of other diseases of the respiratory system: Secondary | ICD-10-CM

## 2019-06-14 NOTE — Telephone Encounter (Signed)
Relation to pt: self  Call back number: [phone]    Reason for call:  Patient requesting antibody test, please advise

## 2019-06-15 NOTE — Telephone Encounter (Signed)
Order in. She can come get it drawn as long as she doesn't have any symptoms.

## 2019-06-17 LAB — CUP PACEART REMOTE DEVICE CHECK
Date Time Interrogation Session: 20200718184131
Implantable Pulse Generator Implant Date: 20190314

## 2019-06-18 ENCOUNTER — Ambulatory Visit (INDEPENDENT_AMBULATORY_CARE_PROVIDER_SITE_OTHER): Payer: Medicare Other | Admitting: *Deleted

## 2019-06-18 DIAGNOSIS — I639 Cerebral infarction, unspecified: Secondary | ICD-10-CM | POA: Diagnosis not present

## 2019-06-18 NOTE — Telephone Encounter (Signed)
scheduled

## 2019-06-19 ENCOUNTER — Other Ambulatory Visit: Payer: Medicare Other

## 2019-06-19 ENCOUNTER — Other Ambulatory Visit: Payer: Self-pay

## 2019-06-19 DIAGNOSIS — Z8709 Personal history of other diseases of the respiratory system: Secondary | ICD-10-CM

## 2019-06-20 ENCOUNTER — Telehealth: Payer: Self-pay | Admitting: Family Medicine

## 2019-06-20 LAB — SAR COV2 SEROLOGY (COVID19)AB(IGG),IA: SARS-CoV-2 Ab, IgG: NEGATIVE

## 2019-06-20 NOTE — Telephone Encounter (Signed)
Patient notified

## 2019-06-20 NOTE — Telephone Encounter (Signed)
Please let her know that her COVID antibody was negative. Thanks!

## 2019-06-25 ENCOUNTER — Other Ambulatory Visit: Payer: Self-pay

## 2019-06-25 ENCOUNTER — Ambulatory Visit (INDEPENDENT_AMBULATORY_CARE_PROVIDER_SITE_OTHER): Payer: Medicare Other | Admitting: Urology

## 2019-06-25 ENCOUNTER — Encounter: Payer: Self-pay | Admitting: Urology

## 2019-06-25 VITALS — BP 132/81 | HR 85 | Ht 59.0 in | Wt 196.0 lb

## 2019-06-25 DIAGNOSIS — N3020 Other chronic cystitis without hematuria: Secondary | ICD-10-CM | POA: Diagnosis not present

## 2019-06-25 MED ORDER — TRIMETHOPRIM 100 MG PO TABS: 100.0000 mg | ORAL_TABLET | Freq: Every day | ORAL | 3 refills | Status: DC

## 2019-06-25 NOTE — Progress Notes (Signed)
06/25/2019 8:40 AM   Sarah Freeman [place] 09-Mar-1931 [id number]  Referring provider: Kathrine Haddock, NP [address],  [place] [address]  Chief Complaint  Patient presents with  . Follow-up    HPI: Iwas consulted to assess the patient's worsening urinary incontinence over many months.She leaks with coughing sneezing bending and lifting. She has urge incontinence and no bedwetting. She wears 1 pad a day. She will wear 2 pads if active. She soaks more urine if she has urgency.  She voids 4 or 5 times at night. She voids every 2 hours during the day.   She had a urine culture positive on October 05, 2017 Well supported bladder neck with no stress incontinence. She had no significant prolapse.   Myrbetriq did not help. She was switched to Vesicare 5 mg.  The patient will stay on Vesicare and currently it is $110.  Clinically the patient has some nonspecific supr apubic discomfort today but probably is not infected.   Patient had a positive urine culture in January and February 2019.Recent renal ultrasound was normal The patient was started on trimethoprim and was also given Vesicare.  When I saw her last time she was infection free and did not want to go back on Vesicare for persisting urge incontinence.  Overall it was improved.  He has been having cardiac issues  Today Infections on trimethoprim.  Frequency stable.  No blood in urine.  903 sent and I will see her in 1 year  PMH: Past Medical History:  Diagnosis Date  . Hyperlipidemia   . Hypothyroid   . Stroke ([place])   . Urinary incontinence     Surgical History: Past Surgical History:  Procedure Laterality Date  . ABDOMINAL HYSTERECTOMY     age 85  . BREAST BIOPSY Left 2000?   neg. dr. Dwyane Luo office  . CHOLECYSTECTOMY  1964  . COLONOSCOPY     Dr Nicolasa Ducking  . DILATION AND CURETTAGE OF UTERUS    . EYE SURGERY    . LOOP RECORDER INSERTION N/A 02/09/2018   Procedure: LOOP RECORDER INSERTION;   Surgeon: Deboraha Sprang, MD;  Location: [facility];  Service: Cardiovascular;  Laterality: N/A;  . SKIN CANCER EXCISION     face  . TEE WITHOUT CARDIOVERSION N/A 02/09/2018   Procedure: TRANSESOPHAGEAL ECHOCARDIOGRAM (TEE);  Surgeon: Minna Merritts, MD;  Location: [facility];  Service: Cardiovascular;  Laterality: N/A;    Home Medications:  Allergies as of 06/25/2019      Reactions   Statins Other (See Comments)   Leg cramps Leg cramps   Neomycin-polymyxin-gramicidin Other (See Comments)   Pt states wounds do not heal with neosporin Pt states wounds do not heal with neosporin      Medication List       Accurate as of June 25, 2019  8:40 AM. If you have any questions, ask your nurse or doctor.        clopidogrel 75 MG tablet Commonly known as: PLAVIX TAKE 1 TABLET DAILY   diphenhydramine-acetaminophen 25-500 MG Tabs tablet Commonly known as: TYLENOL PM Take 1 tablet by mouth at bedtime as needed (for sleep).   esomeprazole 40 MG capsule Commonly known as: NEXIUM TAKE 1 CAPSULE DAILY   levothyroxine 50 MCG tablet Commonly known as: SYNTHROID Take 1 tablet (50 mcg total) by mouth at bedtime.   RED YEAST RICE PO Take 1 capsule by mouth daily.   trimethoprim 100 MG tablet Commonly known as: TRIMPEX Take 1 tablet (  100 mg total) by mouth daily.   Tylenol Arthritis Pain 650 MG CR tablet Generic drug: acetaminophen Take 650 mg by mouth every 8 (eight) hours as needed for pain.       Allergies:  Allergies  Allergen Reactions  . Statins Other (See Comments)    Leg cramps Leg cramps  . Neomycin-Polymyxin-Gramicidin Other (See Comments)    Pt states wounds do not heal with neosporin Pt states wounds do not heal with neosporin    Family History: Family History  Problem Relation Age of Onset  . Cancer Father        prostate  . Stroke Father   . Hypertension Sister   . Hyperlipidemia Daughter   . Heart disease Son   . Diabetes Maternal Grandmother    . Heart disease Maternal Grandmother   . Heart disease Maternal Grandfather   . Heart disease Daughter   . CAD Mother   . Failure to thrive Mother   . Breast cancer Neg Hx     Social History:  reports that she has never smoked. She has never used smokeless tobacco. She reports that she does not drink alcohol or use drugs.  ROS: UROLOGY Frequent Urination?: No Hard to postpone urination?: No Burning/pain with urination?: No Get up at night to urinate?: Yes Leakage of urine?: Yes Urine stream starts and stops?: No Trouble starting stream?: No Do you have to strain to urinate?: No Blood in urine?: No Urinary tract infection?: No Sexually transmitted disease?: No Injury to kidneys or bladder?: No Painful intercourse?: No Weak stream?: No Currently pregnant?: No Vaginal bleeding?: No Last menstrual period?: n  Gastrointestinal Nausea?: No Vomiting?: No Indigestion/heartburn?: No Diarrhea?: No Constipation?: No  Constitutional Fever: No Night sweats?: No Weight loss?: No Fatigue?: No  Skin Skin rash/lesions?: No Itching?: No  Eyes Blurred vision?: No Double vision?: No  Ears/Nose/Throat Sore throat?: No Sinus problems?: No  Hematologic/Lymphatic Swollen glands?: No Easy bruising?: No  Cardiovascular Leg swelling?: No Chest pain?: No  Respiratory Cough?: No Shortness of breath?: No  Endocrine Excessive thirst?: No  Musculoskeletal Back pain?: No Joint pain?: No  Neurological Headaches?: No Dizziness?: No  Psychologic Depression?: No Anxiety?: No  Physical Exam: BP 132/81   Pulse 85   Ht 4\' 11"  (1.499 m)   Wt 196 lb (88.9 kg)   BMI 39.59 kg/m    Laboratory Data: Lab Results  Component Value Date   WBC 6.5 05/10/2019   HGB 12.5 05/10/2019   HCT 38.2 05/10/2019   MCV 99 (H) 05/10/2019   PLT 287 05/10/2019    Lab Results  Component Value Date   CREATININE 0.83 05/10/2019    No results found for: PSA  No results found  for: TESTOSTERONE  Lab Results  Component Value Date   HGBA1C 6.1 (H) 01/18/2018    Urinalysis    Component Value Date/Time   COLORURINE YELLOW (A) 01/18/2018 1925   APPEARANCEUR Hazy (A) 10/11/2018 0837   LABSPEC 1.006 01/18/2018 1925   PHURINE 7.0 01/18/2018 1925   GLUCOSEU Negative 10/11/2018 0837   HGBUR NEGATIVE 01/18/2018 1925   BILIRUBINUR Negative 10/11/2018 0837   KETONESUR NEGATIVE 01/18/2018 1925   PROTEINUR Negative 10/11/2018 0837   PROTEINUR NEGATIVE 01/18/2018 1925   NITRITE Negative 10/11/2018 0837   NITRITE NEGATIVE 01/18/2018 1925   LEUKOCYTESUR 1+ (A) 10/11/2018 0837    Pertinent Imaging:   Assessment & Plan: See in 1 year  There are no diagnoses linked to this encounter.  No follow-ups  on file.  Reece Packer, MD  [place] [address] [place], [place] [address] [phone]

## 2019-07-03 NOTE — Progress Notes (Signed)
Carelink Summary Report / Loop Recorder 

## 2019-07-19 ENCOUNTER — Ambulatory Visit (INDEPENDENT_AMBULATORY_CARE_PROVIDER_SITE_OTHER): Payer: Medicare Other | Admitting: *Deleted

## 2019-07-19 DIAGNOSIS — G459 Transient cerebral ischemic attack, unspecified: Secondary | ICD-10-CM

## 2019-07-19 DIAGNOSIS — I471 Supraventricular tachycardia: Secondary | ICD-10-CM

## 2019-07-19 LAB — CUP PACEART REMOTE DEVICE CHECK
Date Time Interrogation Session: 20200820201038
Implantable Pulse Generator Implant Date: 20190314

## 2019-07-27 NOTE — Progress Notes (Signed)
Carelink Summary Report / Loop Recorder 

## 2019-08-21 ENCOUNTER — Ambulatory Visit (INDEPENDENT_AMBULATORY_CARE_PROVIDER_SITE_OTHER): Payer: Medicare Other | Admitting: *Deleted

## 2019-08-21 DIAGNOSIS — G459 Transient cerebral ischemic attack, unspecified: Secondary | ICD-10-CM

## 2019-08-21 DIAGNOSIS — I471 Supraventricular tachycardia: Secondary | ICD-10-CM

## 2019-08-21 LAB — CUP PACEART REMOTE DEVICE CHECK
Date Time Interrogation Session: 20200922200837
Implantable Pulse Generator Implant Date: 20190314

## 2019-08-30 NOTE — Progress Notes (Signed)
Carelink Summary Report / Loop Recorder 

## 2019-09-24 ENCOUNTER — Ambulatory Visit (INDEPENDENT_AMBULATORY_CARE_PROVIDER_SITE_OTHER): Payer: Medicare Other | Admitting: *Deleted

## 2019-09-24 DIAGNOSIS — G459 Transient cerebral ischemic attack, unspecified: Secondary | ICD-10-CM

## 2019-09-24 DIAGNOSIS — I471 Supraventricular tachycardia: Secondary | ICD-10-CM

## 2019-09-25 LAB — CUP PACEART REMOTE DEVICE CHECK
Date Time Interrogation Session: 20201025201207
Implantable Pulse Generator Implant Date: 20190314

## 2019-10-12 NOTE — Progress Notes (Signed)
Carelink Summary Report / Loop Recorder 

## 2019-10-21 NOTE — Progress Notes (Signed)
Cardiology Office Note  Date:  10/22/2019   ID:  Charmian, Ferran [birth year], MRN [id number]  PCP:  Kathrine Haddock, NP   Chief Complaint  Patient presents with  . Other    12 month f/u no complaints today . Meds reviewed verbally with pt.    HPI:  [name] is a 85 year old woman with past medical history of  Aortic atherosclerosis History of stroke watershed infarct 2 years ago, distribution infarcts in the right  MCA. Discharged on aspirin Plavix March 2017 Urinary tract infections  Repeat hospitalization February 2019 for recurrent stroke EF >55% in 01/2018 Who presents for follow up of her stroke symptoms, hyperlipidemia, chronic small vessel disease  In general feels well with no complaints No atrial fib on loop monitor, downloads reviewed with her Rare SVT  On prior clinic visit was taking aspirin Plavix, now only taking Plavix Denies any excessive bruising  Lab work reviewed with her Takes 1 red yeast rice per day, does not want a statin Total chol 194, LDL 100  Cramps in legs at night, "sprays her legs at night"  EKG personally reviewed by myself on todays visit Shows normal sinus rhythm rate 78 bpm no significant ST-T wave changes  Other past medical history reviewed TEE 02/09/2018  Moderate aortic athero noted   hospital admission February 2019 Left arm weakness, incoordination, numbness MRI noted for right parietal infarct carotid Dopplers with plaque, no hemodynamically significant stenosis echocardiogram noted for normal ejection fraction with stage I diastolic dysfunction Hospital records reviewed with the patient in detail HerDischarged on aspirin Plavix UTI with E. coli Seen by neurology who recommended cardiology follow-up for event monitor  Previously on pravastatin She discontinued this secondary to myalgias Only wants to take red yeast rice  Family of MI and CVA  MRI  01/18/18 shows Background pattern of chronic small vessel  ischemic changes of the cerebral hemispheric white matter an old watershed infarction within the right hemispheric white matter and right frontal cortical and subcortical brain. Few small areas of acute infarction are present in the right parietal deep white matter and a single right parietal gyrus, possibly the precentral gyrus. No swelling or hemorrhage.  01/18/2018  CT HEAD WO CONTRAST  IMPRESSION: No acute abnormality. Atrophy and chronic microvascular ischemic change. Atherosclerosis. Small right mastoid effusion.  01/18/2018  MR BRAIN Wo Contrast  IMPRESSION: Background pattern of chronic small vessel ischemic changes of the cerebral hemispheric white matter an old watershed infarction within the right hemispheric white matter and right frontal cortical and subcortical brain. Few small areas of acute infarction are present in the right parietal deep white matter and a single right parietal gyrus, possibly the precentral gyrus. No swelling or hemorrhage.   EXAM: BILATERAL CAROTID DUPLEX ULTRASOUND RIGHT CAROTID ARTERY: There is a minimal to moderate amount of atherosclerotic plaque within the right carotid bulb (image 15), not resulting in elevated peak systolic velocities within the interrogated course the right internal carotid artery to suggest a hemodynamically significant stenosis.  RIGHT VERTEBRAL ARTERY:Antegrade Flow  LEFT CAROTID ARTERY: There is a minimal amount of atherosclerotic plaque within the left carotid bulb (image 47), not resulting in elevated peak systolic velocities within the interrogated course the left internal carotid artery to suggest a hemodynamically significant stenosis.  LEFT VERTEBRAL ARTERY:Antegrade flow  IMPRESSION: Minimal to moderate amount of bilateral atherosclerotic plaque, right greater than left, not resulting in a hemodynamically significant stenosis within either internal carotid artery.    PMH:  has a past medical  history of Hyperlipidemia, Hypothyroid, Stroke ([place]), and Urinary incontinence.  PSH:    Past Surgical History:  Procedure Laterality Date  . ABDOMINAL HYSTERECTOMY     age 61  . BREAST BIOPSY Left 2000?   neg. dr. Dwyane Luo office  . CHOLECYSTECTOMY  1964  . COLONOSCOPY     Dr Nicolasa Ducking  . DILATION AND CURETTAGE OF UTERUS    . EYE SURGERY    . LOOP RECORDER INSERTION N/A 02/09/2018   Procedure: LOOP RECORDER INSERTION;  Surgeon: Deboraha Sprang, MD;  Location: [facility];  Service: Cardiovascular;  Laterality: N/A;  . SKIN CANCER EXCISION     face  . TEE WITHOUT CARDIOVERSION N/A 02/09/2018   Procedure: TRANSESOPHAGEAL ECHOCARDIOGRAM (TEE);  Surgeon: Minna Merritts, MD;  Location: [facility];  Service: Cardiovascular;  Laterality: N/A;    Current Outpatient Medications  Medication Sig Dispense Refill  . acetaminophen (TYLENOL ARTHRITIS PAIN) 650 MG CR tablet Take 650 mg by mouth every 8 (eight) hours as needed for pain.    Marland Kitchen clopidogrel (PLAVIX) 75 MG tablet TAKE 1 TABLET DAILY 90 tablet 3  . diphenhydramine-acetaminophen (TYLENOL PM) 25-500 MG TABS tablet Take 1 tablet by mouth at bedtime as needed (for sleep).     Marland Kitchen esomeprazole (NEXIUM) 40 MG capsule TAKE 1 CAPSULE DAILY 90 capsule 3  . levothyroxine (SYNTHROID) 50 MCG tablet Take 1 tablet (50 mcg total) by mouth at bedtime. 90 tablet 3  . Red Yeast Rice Extract (RED YEAST RICE PO) Take 1 capsule by mouth daily.     Marland Kitchen trimethoprim (TRIMPEX) 100 MG tablet Take 1 tablet (100 mg total) by mouth daily. 90 tablet 3  . ezetimibe (ZETIA) 10 MG tablet Take 1 tablet (10 mg total) by mouth daily. 90 tablet 3   No current facility-administered medications for this visit.      Allergies:   Statins and Neomycin-polymyxin-gramicidin   Social History:  The patient  reports that she has never smoked. She has never used smokeless tobacco. She reports that she does not drink alcohol or use drugs.   Family History:   family history  includes CAD in her mother; Cancer in her father; Diabetes in her maternal grandmother; Failure to thrive in her mother; Heart disease in her daughter, maternal grandfather, maternal grandmother, and son; Hyperlipidemia in her daughter; Hypertension in her sister; Stroke in her father.    Review of Systems: Review of Systems  Constitutional: Negative.   Respiratory: Negative.   Cardiovascular: Negative.   Gastrointestinal: Negative.   Musculoskeletal: Negative.   Neurological: Negative.   Psychiatric/Behavioral: Negative.   All other systems reviewed and are negative.    PHYSICAL EXAM: VS:  BP 133/81 (BP Location: Left Arm, Patient Position: Sitting, Cuff Size: Large)   Pulse 78   Ht 5' (1.524 m)   Wt 196 lb 8 oz (89.1 kg)   SpO2 98%   BMI 38.38 kg/m  , BMI Body mass index is 38.38 kg/m. Constitutional:  oriented to person, place, and time. No distress.  HENT:  Head: Grossly normal Eyes:  no discharge. No scleral icterus.  Neck: No JVD, no carotid bruits  Cardiovascular: Regular rate and rhythm, no murmurs appreciated Pulmonary/Chest: Clear to auscultation bilaterally, no wheezes or rails Abdominal: Soft.  no distension.  no tenderness.  Musculoskeletal: Normal range of motion Neurological:  normal muscle tone. Coordination normal. No atrophy Skin: Skin warm and dry Psychiatric: normal affect, pleasant  Recent Labs: 05/10/2019: ALT  14; BUN 13; Creatinine, Ser 0.83; Hemoglobin 12.5; Platelets 287; Potassium 4.6; Sodium 140; TSH 2.380    Lipid Panel Lab Results  Component Value Date   CHOL 194 05/10/2019   HDL 47 05/10/2019   LDLCALC 100 (H) 05/10/2019   TRIG 234 (H) 05/10/2019      Wt Readings from Last 3 Encounters:  10/22/19 196 lb 8 oz (89.1 kg)  06/25/19 196 lb (88.9 kg)  05/10/19 198 lb (89.8 kg)      ASSESSMENT AND PLAN:  Cerebrovascular accident (CVA) due to thrombosis of right middle cerebral artery ([place]) - Plan: EKG 12-Lead No further TIA or stroke  symptoms On  Plavix Does not want a statin  Long discussion with her, We will start Zetia, she is willing to try it No atrial fibrillation on loop download  Bilateral carotid artery stenosis Mild to moderate disease also with vertebral artery disease Currently on  Plavix Does not want a statin Zetia started  Mixed hyperlipidemia - Plan: EKG 12-Lead As above reluctant to take  statin Only takes red yeast rice Was previously on pravastatin That he added  Morbid obesity Recommended weight loss, exercise program   Disposition:   F/U  12 months   Total encounter time more than 25 minutes  Greater than 50% was spent in counseling and coordination of care with the patient   Orders Placed This Encounter  Procedures  . EKG 12-Lead     Signed, Esmond Plants, M.D., Ph.D. 10/22/2019  [place], [place]

## 2019-10-22 ENCOUNTER — Encounter: Payer: Self-pay | Admitting: Cardiovascular Disease

## 2019-10-22 ENCOUNTER — Ambulatory Visit (INDEPENDENT_AMBULATORY_CARE_PROVIDER_SITE_OTHER): Payer: Medicare Other | Admitting: Cardiovascular Disease

## 2019-10-22 VITALS — BP 133/81 | HR 78 | Ht 60.0 in | Wt 196.5 lb

## 2019-10-22 DIAGNOSIS — I471 Supraventricular tachycardia: Secondary | ICD-10-CM | POA: Diagnosis not present

## 2019-10-22 DIAGNOSIS — G459 Transient cerebral ischemic attack, unspecified: Secondary | ICD-10-CM | POA: Diagnosis not present

## 2019-10-22 DIAGNOSIS — Z959 Presence of cardiac and vascular implant and graft, unspecified: Secondary | ICD-10-CM

## 2019-10-22 DIAGNOSIS — I6523 Occlusion and stenosis of bilateral carotid arteries: Secondary | ICD-10-CM

## 2019-10-22 DIAGNOSIS — I700 Atherosclerosis of aorta: Secondary | ICD-10-CM | POA: Diagnosis not present

## 2019-10-22 DIAGNOSIS — E782 Mixed hyperlipidemia: Secondary | ICD-10-CM

## 2019-10-22 MED ORDER — EZETIMIBE 10 MG PO TABS: 10.0000 mg | ORAL_TABLET | Freq: Every day | ORAL | 3 refills | Status: DC

## 2019-10-22 NOTE — Patient Instructions (Signed)
Medication Instructions:   Please start zetia one a day for cholesterol  If you need a refill on your cardiac medications before your next appointment, please call your pharmacy.    Lab work: No new labs needed   If you have labs (blood work) drawn today and your tests are completely normal, you will receive your results only by: . MyChart Message (if you have MyChart) OR . A paper copy in the mail If you have any lab test that is abnormal or we need to change your treatment, we will call you to review the results.   Testing/Procedures: No new testing needed   Follow-Up: At [facility], you and your health needs are our priority.  As part of our continuing mission to provide you with exceptional heart care, we have created designated Provider Care Teams.  These Care Teams include your primary Cardiologist (physician) and Advanced Practice Providers (APPs -  Physician Assistants and Nurse Practitioners) who all work together to provide you with the care you need, when you need it.  . You will need a follow up appointment in 12 months .   Please call our office 2 months in advance to schedule this appointment.    . Providers on your designated Care Team:   . Christopher Berge, NP . Ryan Dunn, PA-C . Jacquelyn Visser, PA-C  Any Other Special Instructions Will Be Listed Below (If Applicable).  For educational health videos Log in to : [email or web address] Or : [email or web address], password : [email or web address]   

## 2019-10-28 LAB — CUP PACEART REMOTE DEVICE CHECK
Date Time Interrogation Session: 20201127150905
Implantable Pulse Generator Implant Date: 20190314

## 2019-10-29 ENCOUNTER — Ambulatory Visit (INDEPENDENT_AMBULATORY_CARE_PROVIDER_SITE_OTHER): Payer: Medicare Other | Admitting: *Deleted

## 2019-10-29 DIAGNOSIS — G459 Transient cerebral ischemic attack, unspecified: Secondary | ICD-10-CM

## 2019-11-02 ENCOUNTER — Telehealth: Payer: Self-pay | Admitting: Cardiovascular Disease

## 2019-11-02 NOTE — Telephone Encounter (Signed)
Please call to discuss Zetia for clarification on doseage Reference [id number]

## 2019-11-02 NOTE — Telephone Encounter (Signed)
Returned call to [facility]. zetia was ordered at last OV. They reported that in sept of 2019 they listed zetia as an allergy. I cannot find any documentation of allergy listed.   Routing to MD to further advise.

## 2019-11-04 NOTE — Telephone Encounter (Signed)
Would confirm no allergy with patient Take off list and fill script

## 2019-11-05 ENCOUNTER — Telehealth: Payer: Self-pay

## 2019-11-05 NOTE — Telephone Encounter (Signed)
PA started through cover my meds.

## 2019-11-05 NOTE — Telephone Encounter (Signed)
Call to patient to make her aware of update.   No further questions or concerns at this time.  She will call us if she has any issues with Zetia.

## 2019-11-05 NOTE — Telephone Encounter (Signed)
Call to patient. She denies any recollection on ax hx to Zetia. She requested I call PCP.   Reached out to [employer], spoke to RN saying she did not see any documentation of allergy as well.   Made call back to [facility]. Spoke to [place], he updated pt chart to remove ax. He reported that Zetia would require prior authorization. Key code to start process is [id number].   Routing to in office staff as I do not have access to start PA from home.   We will need to call patient back once this is processed to make her aware we will proceed with zetia.

## 2019-11-05 NOTE — Telephone Encounter (Signed)
Approved today CaseId: [id number] Status: Approved Review Type: Prior Auth Coverage Start Date:10/06/2019 Coverage End Date: 11/04/2020

## 2019-11-05 NOTE — Telephone Encounter (Signed)
PA started through covermymeds.   [place] Auten Key: [id number] PA Case ID: [id number]  Awaiting Response.

## 2019-11-05 NOTE — Telephone Encounter (Signed)
Call to patient around 2 pm to make her aware that we would move forward with zetia Rx. PA had been sent and approved.   Pt appreciative of call and will call with any complications or concerns.

## 2019-11-09 ENCOUNTER — Telehealth: Payer: Self-pay | Admitting: Unknown Physician Specialty

## 2019-11-09 NOTE — Telephone Encounter (Signed)
I cannot order a spinal injection. That will need to come from her surgeon.

## 2019-11-09 NOTE — Telephone Encounter (Signed)
Pt wants Dr Wynetta Emery to know she has stopped the Plavix as of today.  Pt states she cannot get spinal injection until 7 days. Pt states Dr Wynetta Emery needs to order the injection at Dr Jimmey Ralph office.  The one that is giving the injection is Dr Evalee Jefferson at [facility].  Pt would like to make sure the dr orders today, after knowing she has stopped Plavix.   Pt has appt with Dr Wynetta Emery Monday 12/14.

## 2019-11-09 NOTE — Telephone Encounter (Signed)
Patient needs form filled out in your signature folder, she has stopped the Plavix as of today.

## 2019-11-12 ENCOUNTER — Other Ambulatory Visit: Payer: Self-pay

## 2019-11-12 ENCOUNTER — Encounter: Payer: Self-pay | Admitting: Family Medicine

## 2019-11-12 ENCOUNTER — Ambulatory Visit (INDEPENDENT_AMBULATORY_CARE_PROVIDER_SITE_OTHER): Payer: Medicare Other

## 2019-11-12 ENCOUNTER — Other Ambulatory Visit: Payer: Self-pay | Admitting: Family Medicine

## 2019-11-12 ENCOUNTER — Ambulatory Visit (INDEPENDENT_AMBULATORY_CARE_PROVIDER_SITE_OTHER): Payer: Medicare Other | Admitting: Family Medicine

## 2019-11-12 VITALS — BP 119/75 | HR 72 | Temp 98.5°F | Ht 59.06 in | Wt 189.0 lb

## 2019-11-12 VITALS — BP 119/75 | HR 72 | Temp 98.5°F | Resp 15 | Ht 59.0 in | Wt 189.0 lb

## 2019-11-12 DIAGNOSIS — E7800 Pure hypercholesterolemia, unspecified: Secondary | ICD-10-CM | POA: Diagnosis not present

## 2019-11-12 DIAGNOSIS — E039 Hypothyroidism, unspecified: Secondary | ICD-10-CM | POA: Diagnosis not present

## 2019-11-12 DIAGNOSIS — N183 Chronic kidney disease, stage 3 unspecified: Secondary | ICD-10-CM

## 2019-11-12 DIAGNOSIS — Z0000 Encounter for general adult medical examination without abnormal findings: Secondary | ICD-10-CM | POA: Diagnosis not present

## 2019-11-12 DIAGNOSIS — R8281 Pyuria: Secondary | ICD-10-CM

## 2019-11-12 DIAGNOSIS — I10 Essential (primary) hypertension: Secondary | ICD-10-CM | POA: Diagnosis not present

## 2019-11-12 NOTE — Assessment & Plan Note (Signed)
Under good control on current regimen. Continue current regimen. Continue to monitor. Call with any concerns. Refills given. Labs checked today.  

## 2019-11-12 NOTE — Patient Instructions (Signed)
Health Maintenance After Age 83 After age 83, you are at a higher risk for certain long-term diseases and infections as well as injuries from falls. Falls are a major cause of broken bones and head injuries in people who are older than age 83. Getting regular preventive care can help to keep you healthy and well. Preventive care includes getting regular testing and making lifestyle changes as recommended by your health care provider. Talk with your health care provider about:  Which screenings and tests you should have. A screening is a test that checks for a disease when you have no symptoms.  A diet and exercise plan that is right for you. What should I know about screenings and tests to prevent falls? Screening and testing are the best ways to find a health problem early. Early diagnosis and treatment give you the best chance of managing medical conditions that are common after age 83. Certain conditions and lifestyle choices may make you more likely to have a fall. Your health care provider may recommend:  Regular vision checks. Poor vision and conditions such as cataracts can make you more likely to have a fall. If you wear glasses, make sure to get your prescription updated if your vision changes.  Medicine review. Work with your health care provider to regularly review all of the medicines you are taking, including over-the-counter medicines. Ask your health care provider about any side effects that may make you more likely to have a fall. Tell your health care provider if any medicines that you take make you feel dizzy or sleepy.  Osteoporosis screening. Osteoporosis is a condition that causes the bones to get weaker. This can make the bones weak and cause them to break more easily.  Blood pressure screening. Blood pressure changes and medicines to control blood pressure can make you feel dizzy.  Strength and balance checks. Your health care provider may recommend certain tests to check your  strength and balance while standing, walking, or changing positions.  Foot health exam. Foot pain and numbness, as well as not wearing proper footwear, can make you more likely to have a fall.  Depression screening. You may be more likely to have a fall if you have a fear of falling, feel emotionally low, or feel unable to do activities that you used to do.  Alcohol use screening. Using too much alcohol can affect your balance and may make you more likely to have a fall. What actions can I take to lower my risk of falls? General instructions  Talk with your health care provider about your risks for falling. Tell your health care provider if: ? You fall. Be sure to tell your health care provider about all falls, even ones that seem minor. ? You feel dizzy, sleepy, or off-balance.  Take over-the-counter and prescription medicines only as told by your health care provider. These include any supplements.  Eat a healthy diet and maintain a healthy weight. A healthy diet includes low-fat dairy products, low-fat (lean) meats, and fiber from whole grains, beans, and lots of fruits and vegetables. Home safety  Remove any tripping hazards, such as rugs, cords, and clutter.  Install safety equipment such as grab bars in bathrooms and safety rails on stairs.  Keep rooms and walkways well-lit. Activity   Follow a regular exercise program to stay fit. This will help you maintain your balance. Ask your health care provider what types of exercise are appropriate for you.  If you need a cane or   walker, use it as recommended by your health care provider.  Wear supportive shoes that have nonskid soles. Lifestyle  Do not drink alcohol if your health care provider tells you not to drink.  If you drink alcohol, limit how much you have: ? 0-1 drink a day for women. ? 0-2 drinks a day for men.  Be aware of how much alcohol is in your drink. In the [place] one drink equals one typical bottle of beer (12  oz), one-half glass of wine (5 oz), or one shot of hard liquor (1 oz).  Do not use any products that contain nicotine or tobacco, such as cigarettes and e-cigarettes. If you need help quitting, ask your health care provider. Summary  Having a healthy lifestyle and getting preventive care can help to protect your health and wellness after age 83.  Screening and testing are the best way to find a health problem early and help you avoid having a fall. Early diagnosis and treatment give you the best chance for managing medical conditions that are more common for people who are older than age 83.  Falls are a major cause of broken bones and head injuries in people who are older than age 83. Take precautions to prevent a fall at home.  Work with your health care provider to learn what changes you can make to improve your health and wellness and to prevent falls. This information is not intended to replace advice given to you by your health care provider. Make sure you discuss any questions you have with your health care provider. Document Released: 09/28/2017 Document Revised: 03/08/2019 Document Reviewed: 09/28/2017 Elsevier Patient Education  2020 [employer]  

## 2019-11-12 NOTE — Progress Notes (Signed)
Subjective:   Sarah Freeman is a 85 y.o. female who presents for Medicare Annual (Subsequent) preventive examination.   Review of Systems:  Cardiac Risk Factors include: none;dyslipidemia;hypertension     Objective:     Vitals: BP 119/75   Pulse 72   Temp 98.5 F (36.9 C)   Resp 15   Ht 4\' 11"  (1.499 m)   Wt 189 lb (85.7 kg)   SpO2 100%   BMI 38.17 kg/m   Body mass index is 38.17 kg/m.  Advanced Directives 11/12/2019 07/12/2018 03/06/2018 02/09/2018 01/18/2018 01/18/2018 01/18/2018  Does Patient Have a Medical Advance Directive? No Yes Yes No No No No  Type of Advance Directive - [place];Living will Living will - - - -  Does patient want to make changes to medical advance directive? Yes (MAU/Ambulatory/Procedural Areas - Information given) - - - - - -  Copy of Healthcare Power of Attorney in Chart? - No - copy requested - - - - -  Would patient like information on creating a medical advance directive? - - - No - Patient declined - No - Patient declined -    Tobacco Social History   Tobacco Use  Smoking Status Never Smoker  Smokeless Tobacco Never Used     Counseling given: Not Answered   Clinical Intake:  Pre-visit preparation completed: Yes  Pain : 0-10 Pain Score: 2  Pain Type: Chronic pain Pain Location: Back Pain Descriptors / Indicators: Aching Pain Onset: More than a month ago Pain Frequency: Constant     Nutritional Status: BMI > 30  Obese Nutritional Risks: None Diabetes: No  How often do you need to have someone help you when you read instructions, pamphlets, or other written materials from your doctor or pharmacy?: 1 - Never  Interpreter Needed?: No  Information entered by :: Novaleigh Kohlman,LPN  Past Medical History:  Diagnosis Date  . Hyperlipidemia   . Hypothyroid   . Stroke ([place])   . Urinary incontinence    Past Surgical History:  Procedure Laterality Date  . ABDOMINAL HYSTERECTOMY     age 77  . BREAST BIOPSY  Left 2000?   neg. dr. Dwyane Luo office  . CHOLECYSTECTOMY  1964  . COLONOSCOPY     Dr Nicolasa Ducking  . DILATION AND CURETTAGE OF UTERUS    . EYE SURGERY    . LOOP RECORDER INSERTION N/A 02/09/2018   Procedure: LOOP RECORDER INSERTION;  Surgeon: Deboraha Sprang, MD;  Location: [facility];  Service: Cardiovascular;  Laterality: N/A;  . SKIN CANCER EXCISION     face  . TEE WITHOUT CARDIOVERSION N/A 02/09/2018   Procedure: TRANSESOPHAGEAL ECHOCARDIOGRAM (TEE);  Surgeon: Minna Merritts, MD;  Location: [facility];  Service: Cardiovascular;  Laterality: N/A;   Family History  Problem Relation Age of Onset  . Cancer Father        prostate  . Stroke Father   . Hypertension Sister   . Hyperlipidemia Daughter   . Heart disease Son   . Diabetes Maternal Grandmother   . Heart disease Maternal Grandmother   . Heart disease Maternal Grandfather   . Heart disease Daughter   . CAD Mother   . Failure to thrive Mother   . Breast cancer Neg Hx    Social History   Socioeconomic History  . Marital status: Widowed    Spouse name: Not on file  . Number of children: Not on file  . Years of education: Not on file  .  Highest education level: Not on file  Occupational History  . Not on file  Tobacco Use  . Smoking status: Never Smoker  . Smokeless tobacco: Never Used  Substance and Sexual Activity  . Alcohol use: No    Alcohol/week: 0.0 standard drinks  . Drug use: No  . Sexual activity: Never  Other Topics Concern  . Not on file  Social History Narrative  . Not on file   Social Determinants of Health   Financial Resource Strain:   . Difficulty of Paying Living Expenses: Not on file  Food Insecurity:   . Worried About Charity fundraiser in the Last Year: Not on file  . Ran Out of Food in the Last Year: Not on file  Transportation Needs:   . Lack of Transportation (Medical): Not on file  . Lack of Transportation (Non-Medical): Not on file  Physical Activity:   . Days of Exercise  per Week: Not on file  . Minutes of Exercise per Session: Not on file  Stress:   . Feeling of Stress : Not on file  Social Connections:   . Frequency of Communication with Friends and Family: Not on file  . Frequency of Social Gatherings with Friends and Family: Not on file  . Attends Religious Services: Not on file  . Active Member of Clubs or Organizations: Not on file  . Attends Archivist Meetings: Not on file  . Marital Status: Not on file    Outpatient Encounter Medications as of 11/12/2019  Medication Sig  . acetaminophen (TYLENOL ARTHRITIS PAIN) 650 MG CR tablet Take 650 mg by mouth every 8 (eight) hours as needed for pain.  Marland Kitchen clopidogrel (PLAVIX) 75 MG tablet TAKE 1 TABLET DAILY (Patient not taking: Reported on 11/12/2019)  . diphenhydramine-acetaminophen (TYLENOL PM) 25-500 MG TABS tablet Take 1 tablet by mouth at bedtime as needed (for sleep).   Marland Kitchen esomeprazole (NEXIUM) 40 MG capsule TAKE 1 CAPSULE DAILY  . ezetimibe (ZETIA) 10 MG tablet Take 1 tablet (10 mg total) by mouth daily. (Patient not taking: Reported on 11/12/2019)  . levothyroxine (SYNTHROID) 50 MCG tablet Take 1 tablet (50 mcg total) by mouth at bedtime.  . Red Yeast Rice Extract (RED YEAST RICE PO) Take 1 capsule by mouth daily.   Marland Kitchen trimethoprim (TRIMPEX) 100 MG tablet Take 1 tablet (100 mg total) by mouth daily.   No facility-administered encounter medications on file as of 11/12/2019.    Activities of Daily Living In your present state of health, do you have any difficulty performing the following activities: 11/12/2019  Hearing? N  Comment hearing aids  Vision? N  Comment eyeglasses, [facility]  Difficulty concentrating or making decisions? N  Walking or climbing stairs? N  Dressing or bathing? N  Doing errands, shopping? N  Preparing Food and eating ? N  Using the Toilet? N  In the past six months, have you accidently leaked urine? N  Do you have problems with loss of bowel  control? N  Managing your Medications? N  Managing your Finances? N  Housekeeping or managing your Housekeeping? N  Some recent data might be hidden    Patient Care Team: Kathrine Haddock, NP as PCP - General (Nurse Practitioner) Robert Bellow, MD (General Surgery) Valerie Roys, DO as Referring Physician (Family Medicine)    Assessment:   This is a routine wellness examination for Sarah Freeman.  Exercise Activities and Dietary recommendations Current Exercise Habits: Home exercise routine, Type of exercise:  treadmill, Time (Minutes): 20, Frequency (Times/Week): 2, Weekly Exercise (Minutes/Week): 40, Intensity: Mild, Exercise limited by: None identified  Goals    . DIET - INCREASE WATER INTAKE     Recommend drinking at least 6-8 glasses of water a day        Fall Risk: Fall Risk  11/12/2019 10/11/2018 03/06/2018 02/22/2017 03/30/2016  Falls in the past year? 0 0 No Yes No  Number falls in past yr: 0 0 - 1 -  Injury with Fall? 0 0 - No -    FALL RISK PREVENTION PERTAINING TO THE HOME:  Any stairs in or around the home? Yes  If so, are there any without handrails? No   Home free of loose throw rugs in walkways, pet beds, electrical cords, etc? Yes  Adequate lighting in your home to reduce risk of falls? Yes   ASSISTIVE DEVICES UTILIZED TO PREVENT FALLS:  Life alert? No  Use of a cane, walker or w/c? no Grab bars in the bathroom? Yes  Shower chair or bench in shower? Yes  Elevated toilet seat or a handicapped toilet? Yes   DME ORDERS:  DME order needed?  No   TIMED UP AND GO:  Was the test performed? Yes .  Length of time to ambulate 10 feet: 9 sec.   GAIT:  Appearance of gait: Gait steady and fast without the use of an assistive device.  Education: Fall risk prevention has been discussed.  Intervention(s) required? No   DME/home health order needed?  No    Depression Screen PHQ 2/9 Scores 11/12/2019 10/11/2018 03/06/2018 02/22/2017  PHQ - 2 Score 0 0 0 0  PHQ-  9 Score - 0 - 1     Cognitive Function     6CIT Screen 11/12/2019 03/06/2018  What Year? 0 points 0 points  What month? 0 points 0 points  What time? 0 points 0 points  Count back from 20 0 points 0 points  Months in reverse 0 points 0 points  Repeat phrase 0 points 0 points  Total Score 0 0    Immunization History  Administered Date(s) Administered  . Influenza, High Dose Seasonal PF 10/05/2017, 10/11/2018, 08/30/2019  . Influenza-Unspecified 10/06/2016  . Pneumococcal Conjugate-13 10/06/2016  . Pneumococcal Polysaccharide-23 07/22/2004, 10/05/2017  . Td 01/20/2011  . Tdap 05/19/2017  . Zoster 01/09/2008  . Zoster Recombinat (Shingrix) 03/18/2017, 05/19/2017    Qualifies for Shingles Vaccine? Shingrix completed   Tdap: up to date   Flu Vaccine:  Up to date  Pneumococcal Vaccine: up to date   Screening Tests Health Maintenance  Topic Date Due  . TETANUS/TDAP  05/20/2027  . INFLUENZA VACCINE  Completed  . DEXA SCAN  Completed  . PNA vac Low Risk Adult  Completed    Cancer Screenings:  Colorectal Screening: no longer required   Mammogram: no longer required   Bone Density: Completed 2009, no longer required   Lung Cancer Screening: (Low Dose CT Chest recommended if Age 69-80 years, 30 pack-year currently smoking OR have quit w/in 15years.) does not qualify.    Additional Screening:  Hepatitis C Screening: does not qualify  Vision Screening: Recommended annual ophthalmology exams for early detection of glaucoma and other disorders of the eye. Is the patient up to date with their annual eye exam?  Yes  Who is the provider or what is the name of the office in which the pt attends annual eye exams? [facility]    Dental Screening: Recommended annual  dental exams for proper oral hygiene  Community Resource Referral:  CRR required this visit?  No       Plan:  I have personally reviewed and addressed the Medicare Annual Wellness questionnaire and  have noted the following in the patient's chart:  A. Medical and social history B. Use of alcohol, tobacco or illicit drugs  C. Current medications and supplements D. Functional ability and status E.  Nutritional status F.  Physical activity G. Advance directives H. List of other physicians I.  Hospitalizations, surgeries, and ER visits in previous 12 months J.  [place] such as hearing and vision if needed, cognitive and depression L. Referrals and appointments   In addition, I have reviewed and discussed with patient certain preventive protocols, quality metrics, and best practice recommendations. A written personalized care plan for preventive services as well as general preventive health recommendations were provided to patient.  Signed,    Bevelyn Ngo, LPN  [id number] Nurse Health Advisor   Nurse Notes: none

## 2019-11-12 NOTE — Progress Notes (Signed)
BP 119/75   Pulse 72   Temp 98.5 F (36.9 C)   Ht 4' 11.06" (1.5 m)   Wt 189 lb (85.7 kg)   SpO2 100%   BMI 38.10 kg/m    Subjective:    Patient ID: Sarah Freeman, female    DOB: Sep 13, 1931, 85 y.o.   MRN: [id number]  HPI: Sarah Freeman is a 85 y.o. female presenting on 11/12/2019 for comprehensive medical examination. Current medical complaints include:  Having an epidural steroid injection later this week. She is holding her plavix. She has pain going down her legs when she is walking. Doing OK when she's sitting.   HYPERTENSION / HYPERLIPIDEMIA Satisfied with current treatment? yes Duration of hypertension: chronic BP monitoring frequency: not checking BP medication side effects: no Past BP meds: none Duration of hyperlipidemia: chronic Cholesterol medication side effects: no Cholesterol supplements: none Past cholesterol medications: zetia Medication compliance: excellent compliance Aspirin: no Recent stressors: no Recurrent headaches: no Visual changes: no Palpitations: no Dyspnea: no Chest pain: no Lower extremity edema: no Dizzy/lightheaded: no  HYPOTHYROIDISM Thyroid control status:stable Satisfied with current treatment? yes Medication side effects: no Medication compliance: excellent compliance Recent dose adjustment:no Fatigue: no Cold intolerance: no Heat intolerance: no Weight gain: no Weight loss: no Constipation: no Diarrhea/loose stools: no Palpitations: no Lower extremity edema: no Anxiety/depressed mood: no  Menopausal Symptoms: no  Depression Screen done today and results listed below:  Depression screen [facility] 2/9 11/12/2019 11/12/2019 10/11/2018 03/06/2018 02/22/2017  Decreased Interest 0 0 0 0 0  Down, Depressed, Hopeless 0 0 0 0 0  PHQ - 2 Score 0 0 0 0 0  Altered sleeping 0 - 0 - 0  Tired, decreased energy 0 - 0 - 1  Change in appetite 0 - 0 - 0  Feeling bad or failure about yourself  0 - 0 - 0  Trouble concentrating 0 - 0  - 0  Moving slowly or fidgety/restless 0 - 0 - 0  Suicidal thoughts 0 - 0 - 0  PHQ-9 Score 0 - 0 - 1  Difficult doing work/chores Not difficult at all - Not difficult at all - -    Past Medical History:  Past Medical History:  Diagnosis Date  . Hyperlipidemia   . Hypothyroid   . Stroke ([place])   . Urinary incontinence     Surgical History:  Past Surgical History:  Procedure Laterality Date  . ABDOMINAL HYSTERECTOMY     age 78  . BREAST BIOPSY Left 2000?   neg. dr. Dwyane Luo office  . CHOLECYSTECTOMY  1964  . COLONOSCOPY     Dr Nicolasa Ducking  . DILATION AND CURETTAGE OF UTERUS    . EYE SURGERY    . LOOP RECORDER INSERTION N/A 02/09/2018   Procedure: LOOP RECORDER INSERTION;  Surgeon: Deboraha Sprang, MD;  Location: [facility];  Service: Cardiovascular;  Laterality: N/A;  . SKIN CANCER EXCISION     face  . TEE WITHOUT CARDIOVERSION N/A 02/09/2018   Procedure: TRANSESOPHAGEAL ECHOCARDIOGRAM (TEE);  Surgeon: Minna Merritts, MD;  Location: [facility];  Service: Cardiovascular;  Laterality: N/A;    Medications:  Current Outpatient Medications on File Prior to Visit  Medication Sig  . acetaminophen (TYLENOL ARTHRITIS PAIN) 650 MG CR tablet Take 650 mg by mouth every 8 (eight) hours as needed for pain.  Marland Kitchen clopidogrel (PLAVIX) 75 MG tablet TAKE 1 TABLET DAILY (Patient not taking: Reported on 11/12/2019)  . diphenhydramine-acetaminophen (TYLENOL PM) 25-500 MG  TABS tablet Take 1 tablet by mouth at bedtime as needed (for sleep).   Marland Kitchen esomeprazole (NEXIUM) 40 MG capsule TAKE 1 CAPSULE DAILY  . ezetimibe (ZETIA) 10 MG tablet Take 1 tablet (10 mg total) by mouth daily. (Patient not taking: Reported on 11/12/2019)  . levothyroxine (SYNTHROID) 50 MCG tablet Take 1 tablet (50 mcg total) by mouth at bedtime.  . Red Yeast Rice Extract (RED YEAST RICE PO) Take 1 capsule by mouth daily.   Marland Kitchen trimethoprim (TRIMPEX) 100 MG tablet Take 1 tablet (100 mg total) by mouth daily.   No current  facility-administered medications on file prior to visit.    Allergies:  Allergies  Allergen Reactions  . Statins Other (See Comments)    Leg cramps Leg cramps  . Neomycin-Polymyxin-Gramicidin Other (See Comments)    Pt states wounds do not heal with neosporin Pt states wounds do not heal with neosporin    Social History:  Social History   Socioeconomic History  . Marital status: Widowed    Spouse name: Not on file  . Number of children: Not on file  . Years of education: Not on file  . Highest education level: Not on file  Occupational History  . Not on file  Tobacco Use  . Smoking status: Never Smoker  . Smokeless tobacco: Never Used  Substance and Sexual Activity  . Alcohol use: No    Alcohol/week: 0.0 standard drinks  . Drug use: No  . Sexual activity: Never  Other Topics Concern  . Not on file  Social History Narrative  . Not on file   Social Determinants of Health   Financial Resource Strain:   . Difficulty of Paying Living Expenses: Not on file  Food Insecurity:   . Worried About Charity fundraiser in the Last Year: Not on file  . Ran Out of Food in the Last Year: Not on file  Transportation Needs:   . Lack of Transportation (Medical): Not on file  . Lack of Transportation (Non-Medical): Not on file  Physical Activity:   . Days of Exercise per Week: Not on file  . Minutes of Exercise per Session: Not on file  Stress:   . Feeling of Stress : Not on file  Social Connections:   . Frequency of Communication with Friends and Family: Not on file  . Frequency of Social Gatherings with Friends and Family: Not on file  . Attends Religious Services: Not on file  . Active Member of Clubs or Organizations: Not on file  . Attends Archivist Meetings: Not on file  . Marital Status: Not on file  Intimate Partner Violence:   . Fear of Current or Ex-Partner: Not on file  . Emotionally Abused: Not on file  . Physically Abused: Not on file  . Sexually  Abused: Not on file   Social History   Tobacco Use  Smoking Status Never Smoker  Smokeless Tobacco Never Used   Social History   Substance and Sexual Activity  Alcohol Use No  . Alcohol/week: 0.0 standard drinks    Family History:  Family History  Problem Relation Age of Onset  . Cancer Father        prostate  . Stroke Father   . Hypertension Sister   . Hyperlipidemia Daughter   . Heart disease Son   . Diabetes Maternal Grandmother   . Heart disease Maternal Grandmother   . Heart disease Maternal Grandfather   . Heart disease Daughter   .  CAD Mother   . Failure to thrive Mother   . Breast cancer Neg Hx     Past medical history, surgical history, medications, allergies, family history and social history reviewed with patient today and changes made to appropriate areas of the chart.   Review of Systems  Constitutional: Negative.   HENT: Positive for hearing loss. Negative for congestion, ear discharge, ear pain, nosebleeds, sinus pain, sore throat and tinnitus.   Eyes: Negative.   Respiratory: Negative.  Negative for stridor.   Cardiovascular: Negative.   Gastrointestinal: Positive for constipation. Negative for abdominal pain, blood in stool, diarrhea, heartburn, melena, nausea and vomiting.  Genitourinary: Negative.   Musculoskeletal: Positive for back pain. Negative for falls, joint pain, myalgias and neck pain.  Skin: Negative.   Neurological: Negative.   Endo/Heme/Allergies: Negative.  Negative for environmental allergies and polydipsia. Does not bruise/bleed easily.  Psychiatric/Behavioral: Negative.     All other ROS negative except what is listed above and in the HPI.      Objective:    BP 119/75   Pulse 72   Temp 98.5 F (36.9 C)   Ht 4' 11.06" (1.5 m)   Wt 189 lb (85.7 kg)   SpO2 100%   BMI 38.10 kg/m   Wt Readings from Last 3 Encounters:  11/12/19 189 lb (85.7 kg)  11/12/19 189 lb (85.7 kg)  10/22/19 196 lb 8 oz (89.1 kg)    Physical  Exam Vitals and nursing note reviewed.  Constitutional:      General: She is not in acute distress.    Appearance: Normal appearance. She is not ill-appearing, toxic-appearing or diaphoretic.  HENT:     Head: Normocephalic and atraumatic.     Right Ear: Tympanic membrane, ear canal and external ear normal. There is no impacted cerumen.     Left Ear: Tympanic membrane, ear canal and external ear normal. There is no impacted cerumen.     Nose: Nose normal. No congestion or rhinorrhea.     Mouth/Throat:     Mouth: Mucous membranes are moist.     Pharynx: Oropharynx is clear. No oropharyngeal exudate or posterior oropharyngeal erythema.  Eyes:     General: No scleral icterus.       Right eye: No discharge.        Left eye: No discharge.     Extraocular Movements: Extraocular movements intact.     Conjunctiva/sclera: Conjunctivae normal.     Pupils: Pupils are equal, round, and reactive to light.  Neck:     Vascular: No carotid bruit.  Cardiovascular:     Rate and Rhythm: Normal rate and regular rhythm.     Pulses: Normal pulses.     Heart sounds: No murmur. No friction rub. No gallop.   Pulmonary:     Effort: Pulmonary effort is normal. No respiratory distress.     Breath sounds: Normal breath sounds. No stridor. No wheezing, rhonchi or rales.  Chest:     Chest wall: No tenderness.  Abdominal:     General: Abdomen is flat. Bowel sounds are normal. There is no distension.     Palpations: Abdomen is soft. There is no mass.     Tenderness: There is no abdominal tenderness. There is no right CVA tenderness, left CVA tenderness, guarding or rebound.     Hernia: No hernia is present.  Genitourinary:    Comments: Breast and pelvic exams deferred with shared decision making Musculoskeletal:        General: No  swelling, tenderness, deformity or signs of injury.     Cervical back: Normal range of motion and neck supple. No rigidity. No muscular tenderness.     Right lower leg: No edema.      Left lower leg: No edema.  Lymphadenopathy:     Cervical: No cervical adenopathy.  Skin:    General: Skin is warm and dry.     Capillary Refill: Capillary refill takes less than 2 seconds.     Coloration: Skin is not jaundiced or pale.     Findings: No bruising, erythema, lesion or rash.  Neurological:     General: No focal deficit present.     Mental Status: She is alert and oriented to person, place, and time. Mental status is at baseline.     Cranial Nerves: No cranial nerve deficit.     Sensory: No sensory deficit.     Motor: No weakness.     Coordination: Coordination normal.     Gait: Gait normal.     Deep Tendon Reflexes: Reflexes normal.  Psychiatric:        Mood and Affect: Mood normal.        Behavior: Behavior normal.        Thought Content: Thought content normal.        Judgment: Judgment normal.     Results for orders placed or performed in visit on 11/12/19  Microscopic Examination   URINE  Result Value Ref Range   WBC, UA 6-10 (A) 0 - 5 /hpf   RBC None seen 0 - 2 /hpf   Epithelial Cells (non renal) 0-10 0 - 10 /hpf   Bacteria, UA Moderate (A) None seen/Few  Urine Culture, Reflex   URINE  Result Value Ref Range   Urine Culture, Routine WILL FOLLOW   Microalbumin, Urine Waived  Result Value Ref Range   Microalb, Ur Waived 30 (H) 0 - 19 mg/L   Creatinine, Urine Waived 200 10 - 300 mg/dL   Microalb/Creat Ratio <30 <30 mg/g  UA/M w/rflx Culture, Routine   Specimen: Urine   URINE  Result Value Ref Range   Specific Gravity, UA 1.015 1.005 - 1.030   pH, UA 5.5 5.0 - 7.5   Color, UA Yellow Yellow   Appearance Ur Hazy (A) Clear   Leukocytes,UA Trace (A) Negative   Protein,UA Negative Negative/Trace   Glucose, UA Negative Negative   Ketones, UA Trace (A) Negative   RBC, UA Negative Negative   Bilirubin, UA Negative Negative   Urobilinogen, Ur 0.2 0.2 - 1.0 mg/dL   Nitrite, UA Negative Negative   Microscopic Examination See below:    Urinalysis Reflex  Comment       Assessment & Plan:   Problem List Items Addressed This Visit      Cardiovascular and Mediastinum   HTN (hypertension)    Under good control on current regimen. Continue current regimen. Continue to monitor. Call with any concerns. Refills given. Labs checked today.       Relevant Orders   Comp Met (CMET)   Microalbumin, Urine Waived (Completed)   UA/M w/rflx Culture, Routine (Completed)     Endocrine   Hypothyroidism    Rechecking levels today. Await results  Call with any concerns.       Relevant Orders   Comp Met (CMET)   TSH     Genitourinary   Chronic kidney disease, stage 3    Rechecking levels today. Await results  Call with any concerns.  Relevant Orders   CBC with Differential OUT   Comp Met (CMET)   Microalbumin, Urine Waived (Completed)   UA/M w/rflx Culture, Routine (Completed)     Other   Hypercholesterolemia    Under good control on current regimen. Continue current regimen. Continue to monitor. Call with any concerns. Refills given. Labs checked today.       Relevant Orders   Comp Met (CMET)   Lipid Panel w/o Chol/HDL Ratio OUT    Other Visit Diagnoses    Routine general medical examination at a health care facility    -  Primary   Vaccines up to date. Screening labs checked today. DEXA declined. Continue diet and exercise. Call with any concerns.        Follow up plan: Return in about 6 months (around 05/12/2020).   LABORATORY TESTING:  - Pap smear: not applicable  IMMUNIZATIONS:   - Tdap: Tetanus vaccination status reviewed: last tetanus booster within 10 years. - Influenza: Up to date - Pneumovax: Up to date - Prevnar: Up to date  SCREENING: -Mammogram: Not applicable  - Colonoscopy: Not applicable  - Bone Density: Refused   PATIENT COUNSELING:   Advised to take 1 mg of folate supplement per day if capable of pregnancy.   Sexuality: Discussed sexually transmitted diseases, partner selection, use of condoms,  avoidance of unintended pregnancy  and contraceptive alternatives.   Advised to avoid cigarette smoking.  I discussed with the patient that most people either abstain from alcohol or drink within safe limits (<=14/week and <=4 drinks/occasion for males, <=7/weeks and <= 3 drinks/occasion for females) and that the risk for alcohol disorders and other health effects rises proportionally with the number of drinks per week and how often a drinker exceeds daily limits.  Discussed cessation/primary prevention of drug use and availability of treatment for abuse.   Diet: Encouraged to adjust caloric intake to maintain  or achieve ideal body weight, to reduce intake of dietary saturated fat and total fat, to limit sodium intake by avoiding high sodium foods and not adding table salt, and to maintain adequate dietary potassium and calcium preferably from fresh fruits, vegetables, and low-fat dairy products.    stressed the importance of regular exercise  Injury prevention: Discussed safety belts, safety helmets, smoke detector, smoking near bedding or upholstery.   Dental health: Discussed importance of regular tooth brushing, flossing, and dental visits.    NEXT PREVENTATIVE PHYSICAL DUE IN 1 YEAR. Return in about 6 months (around 05/12/2020).

## 2019-11-12 NOTE — Patient Instructions (Signed)
Sarah Freeman , Thank you for taking time to come for your Medicare Wellness Visit. I appreciate your ongoing commitment to your health goals. Please review the following plan we discussed and let me know if I can assist you in the future.   Screening recommendations/referrals: Colonoscopy: no longer required Mammogram: no longer required Bone Density:  No longer required Recommended yearly ophthalmology/optometry visit for glaucoma screening and checkup Recommended yearly dental visit for hygiene and checkup  Vaccinations: Influenza vaccine: up to date  Pneumococcal vaccine: up to date Tdap vaccine: up to date  Shingles vaccine: up to date     Advanced directives: Advance directive discussed with you today. I have provided a copy for you to complete at home and have notarized. Once this is complete please bring a copy in to our office so we can scan it into your chart.  Conditions/risks identified: none   Next appointment: Follow up in one year for your annual wellness visit    Preventive Care 65 Years and Older, Female Preventive care refers to lifestyle choices and visits with your health care provider that can promote health and wellness. What does preventive care include?  A yearly physical exam. This is also called an annual well check.  Dental exams once or twice a year.  Routine eye exams. Ask your health care provider how often you should have your eyes checked.  Personal lifestyle choices, including:  Daily care of your teeth and gums.  Regular physical activity.  Eating a healthy diet.  Avoiding tobacco and drug use.  Limiting alcohol use.  Practicing safe sex.  Taking low-dose aspirin every day.  Taking vitamin and mineral supplements as recommended by your health care provider. What happens during an annual well check? The services and screenings done by your health care provider during your annual well check will depend on your age, overall health,  lifestyle risk factors, and family history of disease. Counseling  Your health care provider may ask you questions about your:  Alcohol use.  Tobacco use.  Drug use.  Emotional well-being.  Home and relationship well-being.  Sexual activity.  Eating habits.  History of falls.  Memory and ability to understand (cognition).  Work and work Statistician.  Reproductive health. Screening  You may have the following tests or measurements:  Height, weight, and BMI.  Blood pressure.  Lipid and cholesterol levels. These may be checked every 5 years, or more frequently if you are over 82 years old.  Skin check.  Lung cancer screening. You may have this screening every year starting at age 29 if you have a 30-pack-year history of smoking and currently smoke or have quit within the past 15 years.  Fecal occult blood test (FOBT) of the stool. You may have this test every year starting at age 62.  Flexible sigmoidoscopy or colonoscopy. You may have a sigmoidoscopy every 5 years or a colonoscopy every 10 years starting at age 75.  Hepatitis C blood test.  Hepatitis B blood test.  Sexually transmitted disease (STD) testing.  Diabetes screening. This is done by checking your blood sugar (glucose) after you have not eaten for a while (fasting). You may have this done every 1-3 years.  Bone density scan. This is done to screen for osteoporosis. You may have this done starting at age 74.  Mammogram. This may be done every 1-2 years. Talk to your health care provider about how often you should have regular mammograms. Talk with your health care provider about  your test results, treatment options, and if necessary, the need for more tests. Vaccines  Your health care provider may recommend certain vaccines, such as:  Influenza vaccine. This is recommended every year.  Tetanus, diphtheria, and acellular pertussis (Tdap, Td) vaccine. You may need a Td booster every 10 years.  Zoster  vaccine. You may need this after age 66.  Pneumococcal 13-valent conjugate (PCV13) vaccine. One dose is recommended after age 39.  Pneumococcal polysaccharide (PPSV23) vaccine. One dose is recommended after age 68. Talk to your health care provider about which screenings and vaccines you need and how often you need them. This information is not intended to replace advice given to you by your health care provider. Make sure you discuss any questions you have with your health care provider. Document Released: 12/12/2015 Document Revised: 08/04/2016 Document Reviewed: 09/16/2015 Elsevier Interactive Patient Education  2017 [place] Prevention in the Home Falls can cause injuries. They can happen to people of all ages. There are many things you can do to make your home safe and to help prevent falls. What can I do on the outside of my home?  Regularly fix the edges of walkways and driveways and fix any cracks.  Remove anything that might make you trip as you walk through a door, such as a raised step or threshold.  Trim any bushes or trees on the path to your home.  Use bright outdoor lighting.  Clear any walking paths of anything that might make someone trip, such as rocks or tools.  Regularly check to see if handrails are loose or broken. Make sure that both sides of any steps have handrails.  Any raised decks and porches should have guardrails on the edges.  Have any leaves, snow, or ice cleared regularly.  Use sand or salt on walking paths during winter.  Clean up any spills in your garage right away. This includes oil or grease spills. What can I do in the bathroom?  Use night lights.  Install grab bars by the toilet and in the tub and shower. Do not use towel bars as grab bars.  Use non-skid mats or decals in the tub or shower.  If you need to sit down in the shower, use a plastic, non-slip stool.  Keep the floor dry. Clean up any water that spills on the  floor as soon as it happens.  Remove soap buildup in the tub or shower regularly.  Attach bath mats securely with double-sided non-slip rug tape.  Do not have throw rugs and other things on the floor that can make you trip. What can I do in the bedroom?  Use night lights.  Make sure that you have a light by your bed that is easy to reach.  Do not use any sheets or blankets that are too big for your bed. They should not hang down onto the floor.  Have a firm chair that has side arms. You can use this for support while you get dressed.  Do not have throw rugs and other things on the floor that can make you trip. What can I do in the kitchen?  Clean up any spills right away.  Avoid walking on wet floors.  Keep items that you use a lot in easy-to-reach places.  If you need to reach something above you, use a strong step stool that has a grab bar.  Keep electrical cords out of the way.  Do not use floor polish or wax  that makes floors slippery. If you must use wax, use non-skid floor wax.  Do not have throw rugs and other things on the floor that can make you trip. What can I do with my stairs?  Do not leave any items on the stairs.  Make sure that there are handrails on both sides of the stairs and use them. Fix handrails that are broken or loose. Make sure that handrails are as long as the stairways.  Check any carpeting to make sure that it is firmly attached to the stairs. Fix any carpet that is loose or worn.  Avoid having throw rugs at the top or bottom of the stairs. If you do have throw rugs, attach them to the floor with carpet tape.  Make sure that you have a light switch at the top of the stairs and the bottom of the stairs. If you do not have them, ask someone to add them for you. What else can I do to help prevent falls?  Wear shoes that:  Do not have high heels.  Have rubber bottoms.  Are comfortable and fit you well.  Are closed at the toe. Do not wear  sandals.  If you use a stepladder:  Make sure that it is fully opened. Do not climb a closed stepladder.  Make sure that both sides of the stepladder are locked into place.  Ask someone to hold it for you, if possible.  Clearly mark and make sure that you can see:  Any grab bars or handrails.  First and last steps.  Where the edge of each step is.  Use tools that help you move around (mobility aids) if they are needed. These include:  Canes.  Walkers.  Scooters.  Crutches.  Turn on the lights when you go into a dark area. Replace any light bulbs as soon as they burn out.  Set up your furniture so you have a clear path. Avoid moving your furniture around.  If any of your floors are uneven, fix them.  If there are any pets around you, be aware of where they are.  Review your medicines with your doctor. Some medicines can make you feel dizzy. This can increase your chance of falling. Ask your doctor what other things that you can do to help prevent falls. This information is not intended to replace advice given to you by your health care provider. Make sure you discuss any questions you have with your health care provider. Document Released: 09/11/2009 Document Revised: 04/22/2016 Document Reviewed: 12/20/2014 Elsevier Interactive Patient Education  2017 [employer].

## 2019-11-12 NOTE — Assessment & Plan Note (Signed)
Rechecking levels today. Await results. Call with any concerns.  

## 2019-11-13 ENCOUNTER — Telehealth: Payer: Self-pay | Admitting: Family Medicine

## 2019-11-13 LAB — COMPREHENSIVE METABOLIC PANEL
ALT: 19 IU/L (ref 0–32)
AST: 17 IU/L (ref 0–40)
Albumin/Globulin Ratio: 1.5 (ref 1.2–2.2)
Albumin: 4 g/dL (ref 3.6–4.6)
Alkaline Phosphatase: 50 IU/L (ref 39–117)
BUN/Creatinine Ratio: 9 — ABNORMAL LOW (ref 12–28)
BUN: 10 mg/dL (ref 8–27)
Bilirubin Total: 0.3 mg/dL (ref 0.0–1.2)
CO2: 23 mmol/L (ref 20–29)
Calcium: 8.8 mg/dL (ref 8.7–10.3)
Chloride: 104 mmol/L (ref 96–106)
Creatinine, Ser: 1.13 mg/dL — ABNORMAL HIGH (ref 0.57–1.00)
GFR calc Af Amer: 51 mL/min/{1.73_m2} — ABNORMAL LOW (ref >59)
GFR calc non Af Amer: 44 mL/min/{1.73_m2} — ABNORMAL LOW (ref >59)
Globulin, Total: 2.7 g/dL (ref 1.5–4.5)
Glucose: 104 mg/dL — ABNORMAL HIGH (ref 65–99)
Potassium: 4.6 mmol/L (ref 3.5–5.2)
Sodium: 137 mmol/L (ref 134–144)
Total Protein: 6.7 g/dL (ref 6.0–8.5)

## 2019-11-13 LAB — CBC WITH DIFFERENTIAL/PLATELET
Basophils Absolute: 0.1 10*3/uL (ref 0.0–0.2)
Basos: 2 %
EOS (ABSOLUTE): 0.3 10*3/uL (ref 0.0–0.4)
Eos: 6 %
Hematocrit: 37.8 % (ref 34.0–46.6)
Hemoglobin: 12.7 g/dL (ref 11.1–15.9)
Immature Grans (Abs): 0 10*3/uL (ref 0.0–0.1)
Immature Granulocytes: 0 %
Lymphocytes Absolute: 2.7 10*3/uL (ref 0.7–3.1)
Lymphs: 44 %
MCH: 32.1 pg (ref 26.6–33.0)
MCHC: 33.6 g/dL (ref 31.5–35.7)
MCV: 96 fL (ref 79–97)
Monocytes Absolute: 0.7 10*3/uL (ref 0.1–0.9)
Monocytes: 12 %
Neutrophils Absolute: 2.2 10*3/uL (ref 1.4–7.0)
Neutrophils: 36 %
Platelets: 304 10*3/uL (ref 150–450)
RBC: 3.96 x10E6/uL (ref 3.77–5.28)
RDW: 12.7 % (ref 11.7–15.4)
WBC: 6 10*3/uL (ref 3.4–10.8)

## 2019-11-13 LAB — LIPID PANEL W/O CHOL/HDL RATIO
Cholesterol, Total: 196 mg/dL (ref 100–199)
HDL: 51 mg/dL (ref >39)
LDL Chol Calc (NIH): 115 mg/dL — ABNORMAL HIGH (ref 0–99)
Triglycerides: 172 mg/dL — ABNORMAL HIGH (ref 0–149)
VLDL Cholesterol Cal: 30 mg/dL (ref 5–40)

## 2019-11-13 LAB — TSH: TSH: 3.79 u[IU]/mL (ref 0.450–4.500)

## 2019-11-13 NOTE — Telephone Encounter (Signed)
Patient notified

## 2019-11-13 NOTE — Telephone Encounter (Signed)
Please let her know that her labs came back nice and normal. Thanks!

## 2019-11-14 LAB — UA/M W/RFLX CULTURE, ROUTINE
Bilirubin, UA: NEGATIVE
Glucose, UA: NEGATIVE
Nitrite, UA: NEGATIVE
Protein,UA: NEGATIVE
RBC, UA: NEGATIVE
Specific Gravity, UA: 1.015 (ref 1.005–1.030)
Urobilinogen, Ur: 0.2 mg/dL (ref 0.2–1.0)
pH, UA: 5.5 (ref 5.0–7.5)

## 2019-11-14 LAB — MICROSCOPIC EXAMINATION: RBC, Urine: NONE SEEN /hpf (ref 0–2)

## 2019-11-14 LAB — MICROALBUMIN, URINE WAIVED
Creatinine, Urine Waived: 200 mg/dL (ref 10–300)
Microalb, Ur Waived: 30 mg/L — ABNORMAL HIGH (ref 0–19)
Microalb/Creat Ratio: <30 mg/g (ref <30)

## 2019-11-14 LAB — URINE CULTURE, REFLEX

## 2019-11-21 NOTE — Progress Notes (Signed)
ILR remote 

## 2019-11-29 ENCOUNTER — Ambulatory Visit (INDEPENDENT_AMBULATORY_CARE_PROVIDER_SITE_OTHER): Payer: Medicare Other | Admitting: *Deleted

## 2019-11-29 DIAGNOSIS — G459 Transient cerebral ischemic attack, unspecified: Secondary | ICD-10-CM | POA: Diagnosis not present

## 2019-11-29 LAB — CUP PACEART REMOTE DEVICE CHECK
Date Time Interrogation Session: 20201230230726
Implantable Pulse Generator Implant Date: 20190314

## 2019-11-30 NOTE — Progress Notes (Signed)
ILR remote 

## 2019-12-10 ENCOUNTER — Telehealth: Payer: Self-pay

## 2019-12-10 NOTE — Telephone Encounter (Signed)
The pt states that [employer] fixed her monitor and sent the transmission.

## 2019-12-21 ENCOUNTER — Ambulatory Visit: Payer: Medicare Other | Attending: Internal Medicine

## 2019-12-21 DIAGNOSIS — Z20822 Contact with and (suspected) exposure to covid-19: Secondary | ICD-10-CM

## 2019-12-22 LAB — NOVEL CORONAVIRUS, NAA: SARS-CoV-2, NAA: NOT DETECTED

## 2019-12-31 ENCOUNTER — Ambulatory Visit (INDEPENDENT_AMBULATORY_CARE_PROVIDER_SITE_OTHER): Payer: Medicare Other | Admitting: *Deleted

## 2019-12-31 DIAGNOSIS — G459 Transient cerebral ischemic attack, unspecified: Secondary | ICD-10-CM | POA: Diagnosis not present

## 2019-12-31 LAB — CUP PACEART REMOTE DEVICE CHECK
Date Time Interrogation Session: 20210201000331
Implantable Pulse Generator Implant Date: 20190314

## 2019-12-31 NOTE — Progress Notes (Signed)
ILR Remote 

## 2020-01-01 ENCOUNTER — Other Ambulatory Visit: Payer: Self-pay

## 2020-01-01 MED ORDER — TRIMETHOPRIM 100 MG PO TABS: 100.0000 mg | ORAL_TABLET | Freq: Every day | ORAL | 0 refills | Status: DC

## 2020-01-01 NOTE — Telephone Encounter (Signed)
Temp script sent into local pharmacy while patient waits on mail order

## 2020-01-03 ENCOUNTER — Other Ambulatory Visit: Payer: Self-pay | Admitting: *Deleted

## 2020-01-03 MED ORDER — TRIMETHOPRIM 100 MG PO TABS: 100.0000 mg | ORAL_TABLET | Freq: Every day | ORAL | 0 refills | Status: DC

## 2020-01-04 ENCOUNTER — Other Ambulatory Visit: Payer: Self-pay

## 2020-01-04 NOTE — Telephone Encounter (Signed)
1 year supply given in June 2020 through [facility] but requesting medication be filled and new Rx through [place] 11/12/2019 Next Appt 05/12/2020

## 2020-01-07 MED ORDER — ESOMEPRAZOLE MAGNESIUM 40 MG PO CPDR: 40.0000 mg | DELAYED_RELEASE_CAPSULE | Freq: Every day | ORAL | 1 refills | Status: DC

## 2020-01-07 MED ORDER — CLOPIDOGREL BISULFATE 75 MG PO TABS: 75.0000 mg | ORAL_TABLET | Freq: Every day | ORAL | 1 refills | Status: DC

## 2020-01-07 MED ORDER — LEVOTHYROXINE SODIUM 50 MCG PO TABS: 50.0000 ug | ORAL_TABLET | Freq: Every day | ORAL | 1 refills | Status: DC

## 2020-01-07 NOTE — Telephone Encounter (Signed)
Account was deactivated so medications was cancelled.

## 2020-01-07 NOTE — Telephone Encounter (Signed)
Please cancel at previous pharmacy.

## 2020-01-07 NOTE — Telephone Encounter (Signed)
Was given a years supply in June. Should not be due for another 4 months.

## 2020-01-07 NOTE — Telephone Encounter (Signed)
Patient has switched pharmacies and needs medications sent to new mail order.

## 2020-01-10 ENCOUNTER — Ambulatory Visit: Payer: Self-pay | Admitting: *Deleted

## 2020-01-10 NOTE — Telephone Encounter (Signed)
Routing to provider as FYI

## 2020-01-10 NOTE — Telephone Encounter (Signed)
Contacted pt per her request to know if tramadol is a statin; the pt states that she has already been informed that this medication is not a statin; she is seen at [facility]; will route to office for notification.  Reason for Disposition . General information question, no triage required and triager able to answer question  Answer Assessment - Initial Assessment Questions 1. REASON FOR CALL or QUESTION: "What is your reason for calling today?" or "How can I best help you?" or "What question do you have that I can help answer?"     Is tramadol a statin  Protocols used: [facility]

## 2020-01-25 IMAGING — US US CAROTID DUPLEX BILAT
1 series · 13 of 24 positions shown · non-contrast
Comparison: None.

CLINICAL DATA: Left arm weakness

EXAM:
BILATERAL CAROTID DUPLEX ULTRASOUND
TECHNIQUE: Gray scale imaging, color Doppler and duplex ultrasound were
performed of bilateral carotid and vertebral arteries in the neck.

[Series 1: us carotid duplex bilat · 13 of 64 slices shown]
[im 1/64]
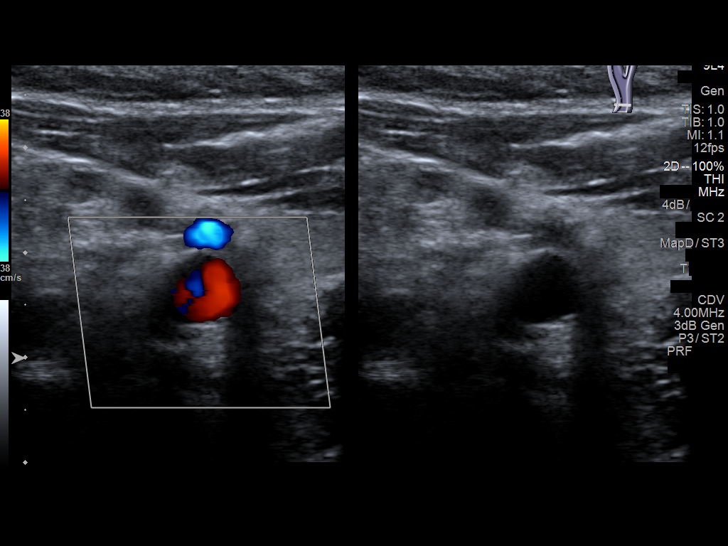
[im 6/64]
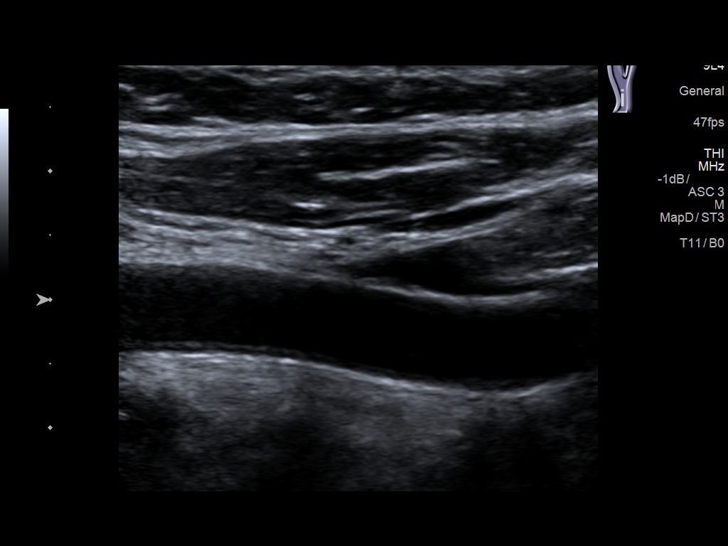
[im 11/64]
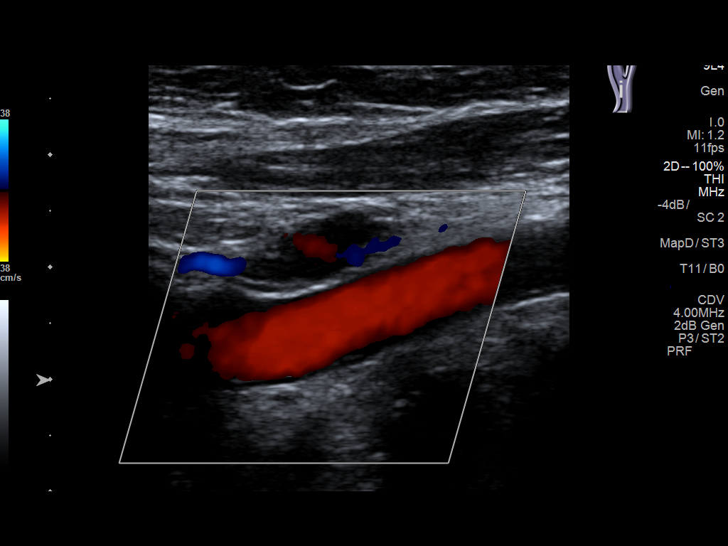
[im 17/64]
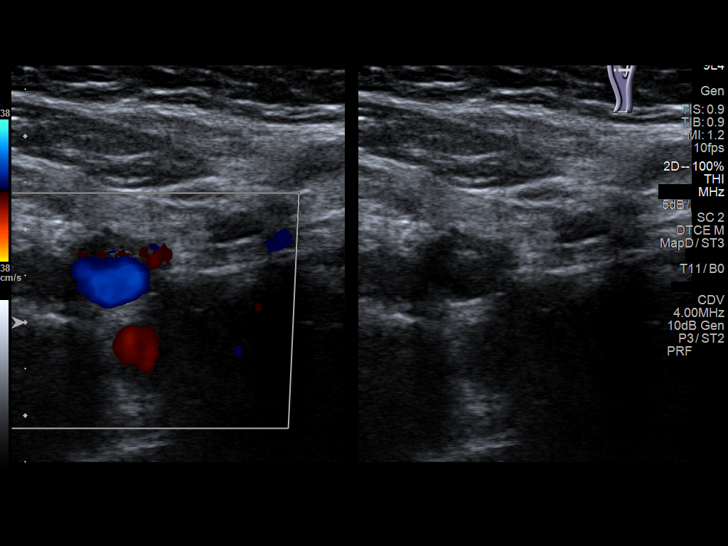
[im 22/64]
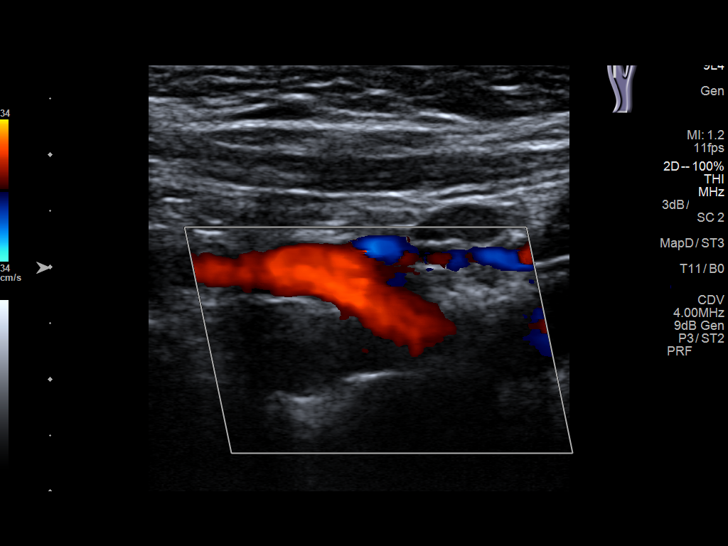
[im 28/64]
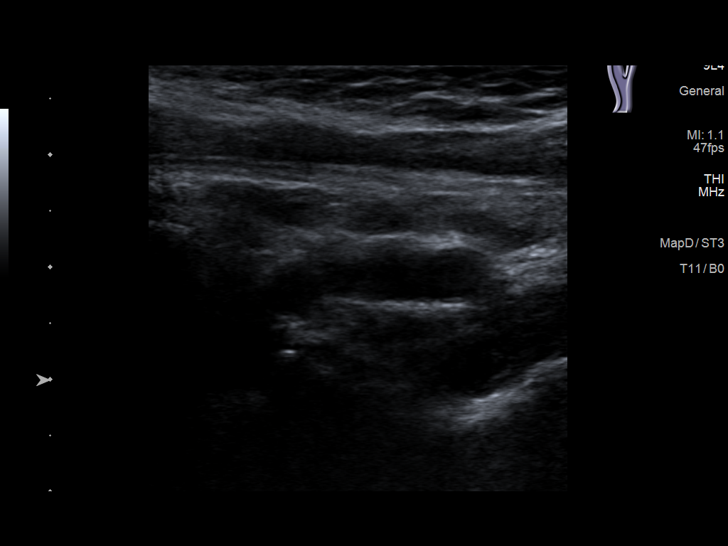
[im 33/64]
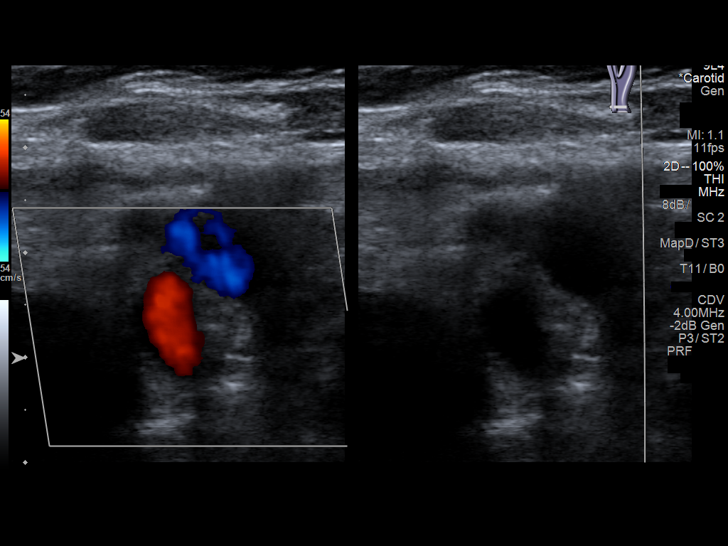
[im 36/64]
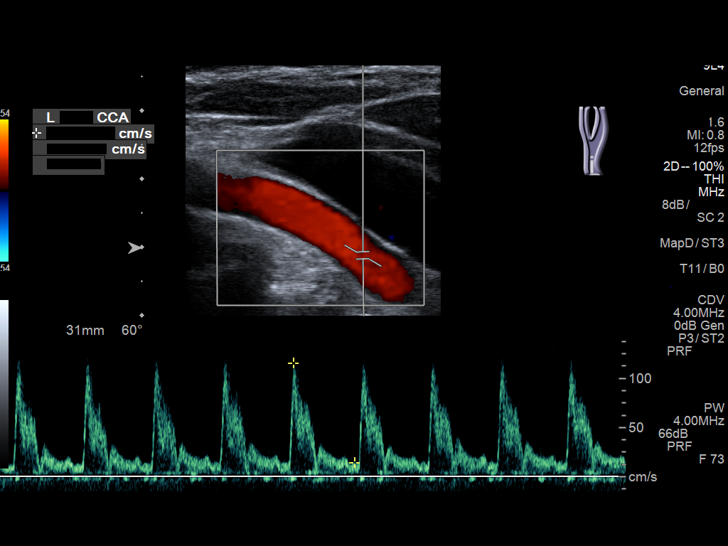
[im 42/64]
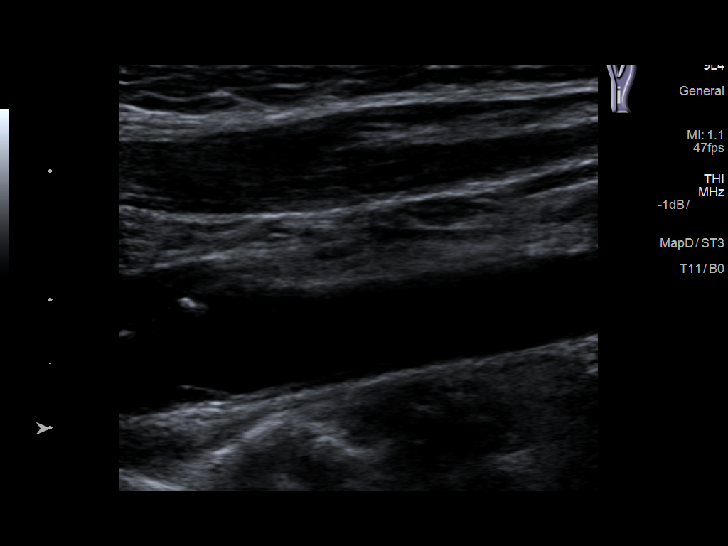
[im 47/64]
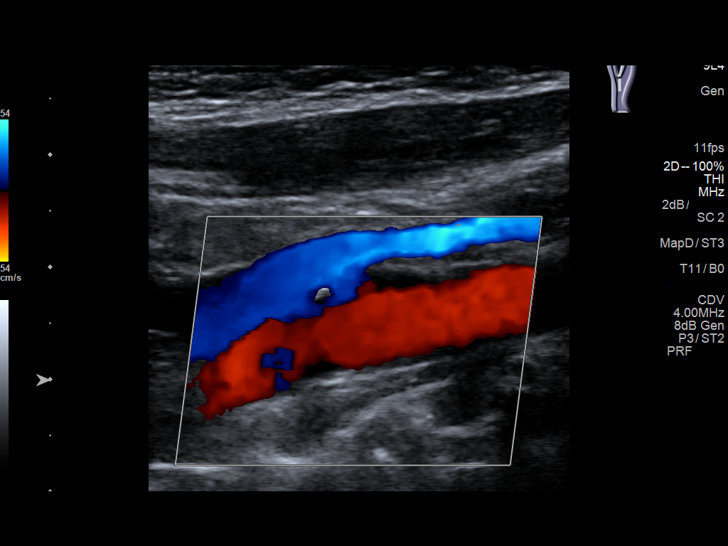
[im 53/64]
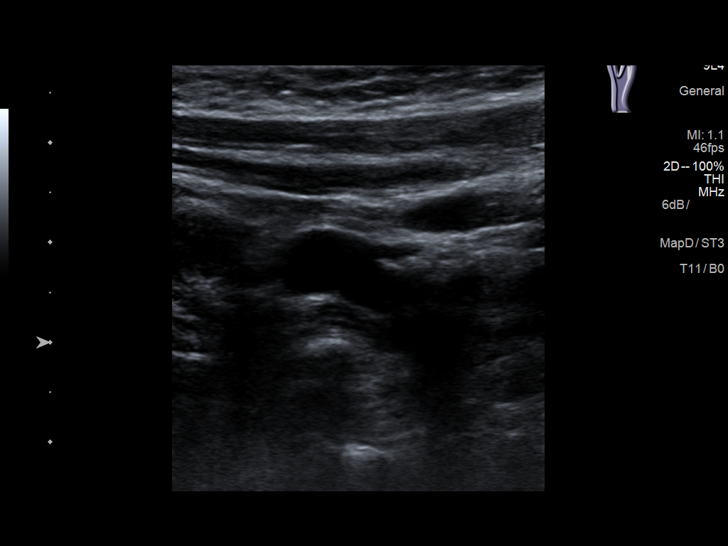
[im 58/64]
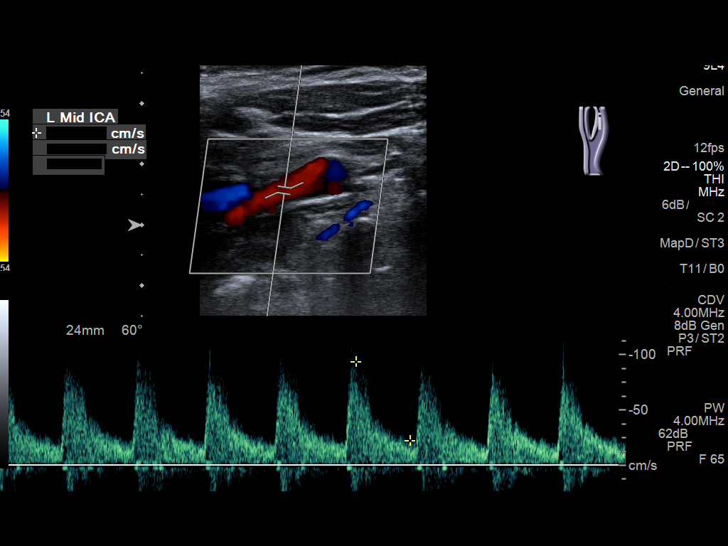
[im 64/64]
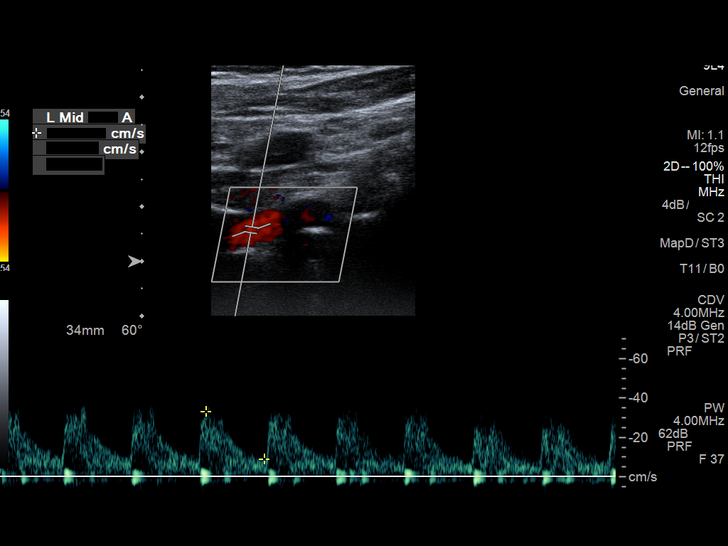

[13 of 24 positions shown; findings below may reference images not displayed]

FINDINGS: Criteria: Quantification of carotid stenosis is based on velocity
parameters that correlate the residual internal carotid diameter
with NASCET-based stenosis levels, using the diameter of the distal
internal carotid lumen as the denominator for stenosis measurement.

The following velocity measurements were obtained:

RIGHT

ICA:  106/16 cm/sec

CCA:  99/11 cm/sec

SYSTOLIC ICA/CCA RATIO:

DIASTOLIC ICA/CCA RATIO:

ECA:  83 cm/sec

LEFT

ICA:  182/26 cm/sec

CCA:  107/15 cm/sec

SYSTOLIC ICA/CCA RATIO:

DIASTOLIC ICA/CCA RATIO:

ECA:  82 cm/sec

RIGHT CAROTID ARTERY: There is a minimal to moderate amount of
atherosclerotic plaque within the right carotid bulb (image 15), not
resulting in elevated peak systolic velocities within the
interrogated course the right internal carotid artery to suggest a
hemodynamically significant stenosis.

RIGHT VERTEBRAL ARTERY:  Antegrade Flow

LEFT CAROTID ARTERY: There is a minimal amount of atherosclerotic
plaque within the left carotid bulb (image 47), not resulting in
elevated peak systolic velocities within the interrogated course the
left internal carotid artery to suggest a hemodynamically
significant stenosis.

LEFT VERTEBRAL ARTERY:  Antegrade flow
IMPRESSION: Minimal to moderate amount of bilateral atherosclerotic plaque,
right greater than left, not resulting in a hemodynamically
significant stenosis within either internal carotid artery.

## 2020-01-31 ENCOUNTER — Ambulatory Visit (INDEPENDENT_AMBULATORY_CARE_PROVIDER_SITE_OTHER): Payer: Medicare Other | Admitting: *Deleted

## 2020-01-31 DIAGNOSIS — G459 Transient cerebral ischemic attack, unspecified: Secondary | ICD-10-CM | POA: Diagnosis not present

## 2020-01-31 LAB — CUP PACEART REMOTE DEVICE CHECK
Date Time Interrogation Session: 20210304024617
Implantable Pulse Generator Implant Date: 20190314

## 2020-01-31 NOTE — Progress Notes (Signed)
ILR Remote 

## 2020-02-22 ENCOUNTER — Telehealth: Payer: Self-pay | Admitting: Unknown Physician Specialty

## 2020-02-22 NOTE — Telephone Encounter (Signed)
Sarah Freeman from [facility] Best contact: [place] called in regards to recent fax concerning the end of pt's clopidogrel (PLAVIX) 75 MG tablet Rx.  She wants to stop the Rx for 7 days and the resume after her procedure.

## 2020-02-25 NOTE — Telephone Encounter (Signed)
Fax received.  I need to see if patient needs full clearance appointment or if Dr. Wynetta Emery is okay filling out the form as to it's only for Plavix Clearance.

## 2020-02-25 NOTE — Telephone Encounter (Signed)
Form okay to be filled out. Form placed in Dr. Durenda Age folder to sign.   Requesting to stop taking plavix 7 days prior and resume after her procedure. Routing to provider as Juluis Rainier.

## 2020-02-28 ENCOUNTER — Telehealth: Payer: Self-pay | Admitting: Unknown Physician Specialty

## 2020-02-28 NOTE — Telephone Encounter (Signed)
Spoke with Sarah Freeman she stated insurance has not approved shot yet.

## 2020-02-28 NOTE — Telephone Encounter (Signed)
Patient called in stating she would like to get clarification on this before the office closes. Please advise.

## 2020-02-28 NOTE — Telephone Encounter (Signed)
Copied from [place] [id number]. Topic: General - Other >> Feb 28, 2020  3:22 PM [name] wrote: Reason for CRM: Patient would like the nurse to call her regarding her Plavix.  Patient stopped taking it for 8 days because she was supposed to get a shot in her back.  Patient would like to know if she should start back on her Plavix.  CB# [phone]

## 2020-02-28 NOTE — Telephone Encounter (Signed)
Is she not getting her shot? Can we find out some more information?

## 2020-03-03 ENCOUNTER — Ambulatory Visit (INDEPENDENT_AMBULATORY_CARE_PROVIDER_SITE_OTHER): Payer: Medicare Other | Admitting: *Deleted

## 2020-03-03 DIAGNOSIS — G459 Transient cerebral ischemic attack, unspecified: Secondary | ICD-10-CM

## 2020-03-03 LAB — CUP PACEART REMOTE DEVICE CHECK
Date Time Interrogation Session: 20210404034748
Implantable Pulse Generator Implant Date: 20190314

## 2020-03-03 NOTE — Telephone Encounter (Signed)
Form faxed to [facility] 

## 2020-03-04 NOTE — Progress Notes (Signed)
ILR Remote 

## 2020-03-07 ENCOUNTER — Telehealth: Payer: Self-pay | Admitting: Urology

## 2020-03-07 MED ORDER — TRIMETHOPRIM 100 MG PO TABS: 100.0000 mg | ORAL_TABLET | Freq: Every day | ORAL | 1 refills | Status: DC

## 2020-03-07 NOTE — Telephone Encounter (Signed)
Script sent  

## 2020-03-07 NOTE — Telephone Encounter (Signed)
Pt needs a refill for Trimpex 100 mg sent to [facility] in [place] [facility] of [facility].

## 2020-04-07 ENCOUNTER — Telehealth: Payer: Self-pay | Admitting: Cardiovascular Disease

## 2020-04-07 LAB — HM DEXA SCAN

## 2020-04-07 NOTE — Telephone Encounter (Signed)
   [facility] Pre-operative Risk Assessment    Request for surgical clearance:  1. What type of surgery is being performed? Left L3-4 hemilaminectomy  2. When is this surgery scheduled? 04/29/20  3. What type of clearance is required (medical clearance vs. Pharmacy clearance to hold med vs. Both)? both  4. Are there any medications that need to be held prior to surgery and how long?Plavix discontinue 5 days prior to procedure   5. Practice name and name of physician performing surgery? [facility] [place] - Dr Charlotte Sanes   6. What is your office phone number [place]   7.   What is your office fax number [phone] or [phone]  8.   Anesthesia type (None, local, MAC, general) ? General    Caryl Pina Gerringer 04/07/2020, 3:43 PM  _________________________________________________________________   (provider comments below)

## 2020-04-08 NOTE — Telephone Encounter (Signed)
   Primary Cardiologist: Ida Rogue, MD  Chart reviewed as part of pre-operative protocol coverage. Patient was contacted 04/08/2020 in reference to pre-operative risk assessment for pending surgery as outlined below.  Sarah Freeman was last seen on 10/22/19 by Dr. Rockey Situ. She has history of SVT, mild-moderate carotid artery disease, CVA, HLD, morbid obesity, aortic atherosclerosis. She has loop recorder in place that is being monitored. RCRI 0.9% indicating low risk of CV complications based on cardiac risk factors. Age is likely biggest factor in her risk profile at this juncture. She denies any new CP, SOB, palpitations or syncope. 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 No concerning symptoms. Therefore, based on ACC/AHA guidelines, the patient would be at acceptable risk for the planned procedure without further cardiovascular testing.   We are asked to provide input on Plavix. However, she is not on this for cardiac reasons. It is due to her history of stroke and filled through her primary care therefore would recommend surgeon's office contact them to discuss recommendations for holding.  Since patient also has loop recorder, will forward to device clinic so they can reach out to surgeon if there are any intra-operative concerns to be aware of.  I will route this recommendation to the requesting party via Epic fax function and remove from pre-op pool.   Please call with questions.  Charlie Pitter, PA-C 04/08/2020, 9:18 AM

## 2020-04-08 NOTE — Telephone Encounter (Signed)
No special intra-operative precautions/procedures necessary for loop recorder.

## 2020-04-09 ENCOUNTER — Telehealth: Payer: Self-pay

## 2020-04-09 NOTE — Telephone Encounter (Signed)
Please go ahead

## 2020-04-09 NOTE — Telephone Encounter (Signed)
Received fax from [facility] for clearance. Has procedure scheduled on 04/29/20 and needs clearance appointment before then per Dr. Wynetta Emery. Please call to schedule.

## 2020-04-09 NOTE — Telephone Encounter (Signed)
Scheduled Friday 04/11/20 3:45

## 2020-04-09 NOTE — Telephone Encounter (Signed)
[facility] denied rx for Trimethoprim, however if she uses [facility] and pays out of pocket it is no more than $23 for a 90 day supply

## 2020-04-11 ENCOUNTER — Encounter: Payer: Self-pay | Admitting: Family Medicine

## 2020-04-11 ENCOUNTER — Other Ambulatory Visit: Payer: Self-pay

## 2020-04-11 ENCOUNTER — Ambulatory Visit (INDEPENDENT_AMBULATORY_CARE_PROVIDER_SITE_OTHER): Payer: Medicare Other | Admitting: Family Medicine

## 2020-04-11 VITALS — BP 144/79 | HR 96 | Temp 98.3°F | Ht <= 58 in | Wt 174.2 lb

## 2020-04-11 DIAGNOSIS — Z01818 Encounter for other preprocedural examination: Secondary | ICD-10-CM

## 2020-04-11 LAB — COAGUCHEK XS/INR WAIVED
INR: 0.9 (ref 0.9–1.1)
Prothrombin Time: 11.3 s

## 2020-04-11 NOTE — Progress Notes (Addendum)
BP (!) 144/79 (BP Location: Left Arm, Patient Position: Sitting, Cuff Size: Normal)   Pulse 96   Temp 98.3 F (36.8 C) (Oral)   Ht 4' 9.78" (1.468 m)   Wt 174 lb 3.2 oz (79 kg)   SpO2 97%   BMI 36.69 kg/m    Subjective:    Patient ID: Sarah Freeman, female    DOB: 04-May-1931, 86 y.o.   MRN: [id number]  HPI: Sarah Freeman is a 86 y.o. female  Chief Complaint  Patient presents with  . surgical clearance   Dane[place] presents today for surgical clearance. She is supposed to have L3-4 hemilaminectomy on 04/29/20. She has been cleared by cardiology and we are asked for clearance and to stop her plavix which she is on due to remote history of a stroke. Last general anesthesia procedure was her CCY in 1964. She has had other twilight procedures, but thankfully has not needed general anesthesia. She has never had problems with anesthesia in the past. She always went home when she was supposed to. Never had issues with extubation. No problems with post--op nausea or vomiting. No family history of issues with anesthesia. No family history of malignant hyperthermia. She is generally feeling well today. She has no SOB. No CP. Has trouble with her L leg which causes her to have trouble with walking. She can walk up a flight of stairs without issues with her breathing. She is otherwise doing well with no other concerns or complaints at this time.   Active Ambulatory Problems    Diagnosis Date Noted  . Breast microcalcification, mammographic 06/17/2015  . Osteopenia 01/29/2016  . Menopausal state 01/29/2016  . Hypothyroidism 01/29/2016  . GERD (gastroesophageal reflux disease) 01/29/2016  . Hypercholesterolemia 01/29/2016  . Lumbar spinal stenosis 01/29/2016  . Stress incontinence 01/29/2016  . TIA (transient ischemic attack) 02/15/2016  . Counseling regarding advanced directives and goals of care 02/22/2017  . Personal history of transient ischemic attack (TIA), and cerebral infarction without  residual deficits 02/25/2016  . Statin intolerance 06/14/2017  . Left arm weakness 01/18/2018  . Atherosclerosis 01/25/2018  . Carotid stenosis 02/01/2018  . Morbid obesity ([place]) 02/02/2018  . Chronic kidney disease, stage 3 05/24/2018  . SVT (supraventricular tachycardia) ([place]) 10/11/2018  . HTN (hypertension) 10/11/2018  . Weakness of left leg 02/25/2016   Resolved Ambulatory Problems    Diagnosis Date Noted  . Constipation 09/01/2016  . Pain of right leg 11/03/2016  . UTI (urinary tract infection) 11/04/2017   Past Medical History:  Diagnosis Date  . Hyperlipidemia   . Hypothyroid   . Stroke ([place])   . Urinary incontinence    Past Surgical History:  Procedure Laterality Date  . ABDOMINAL HYSTERECTOMY     age 14  . BREAST BIOPSY Left 2000?   neg. dr. Dwyane Luo office  . CHOLECYSTECTOMY  1964  . COLONOSCOPY     Dr Nicolasa Ducking  . DILATION AND CURETTAGE OF UTERUS    . EYE SURGERY    . LOOP RECORDER INSERTION N/A 02/09/2018   Procedure: LOOP RECORDER INSERTION;  Surgeon: Deboraha Sprang, MD;  Location: [facility];  Service: Cardiovascular;  Laterality: N/A;  . SKIN CANCER EXCISION     face  . TEE WITHOUT CARDIOVERSION N/A 02/09/2018   Procedure: TRANSESOPHAGEAL ECHOCARDIOGRAM (TEE);  Surgeon: Minna Merritts, MD;  Location: [facility];  Service: Cardiovascular;  Laterality: N/A;   Outpatient Encounter Medications as of 04/11/2020  Medication Sig  . acetaminophen (TYLENOL  ARTHRITIS PAIN) 650 MG CR tablet Take 650 mg by mouth every 8 (eight) hours as needed for pain.  Marland Kitchen clopidogrel (PLAVIX) 75 MG tablet Take 1 tablet (75 mg total) by mouth daily.  . diphenhydramine-acetaminophen (TYLENOL PM) 25-500 MG TABS tablet Take 1 tablet by mouth at bedtime as needed (for sleep).   Marland Kitchen esomeprazole (NEXIUM) 40 MG capsule Take 1 capsule (40 mg total) by mouth daily.  Marland Kitchen levothyroxine (SYNTHROID) 50 MCG tablet Take 1 tablet (50 mcg total) by mouth at bedtime.  . Red Yeast Rice Extract  (RED YEAST RICE PO) Take 1 capsule by mouth daily.   Marland Kitchen ezetimibe (ZETIA) 10 MG tablet Take 1 tablet (10 mg total) by mouth daily. (Patient not taking: Reported on 11/12/2019)  . trimethoprim (TRIMPEX) 100 MG tablet Take 1 tablet (100 mg total) by mouth daily. (Patient not taking: Reported on 04/11/2020)   No facility-administered encounter medications on file as of 04/11/2020.   Allergies  Allergen Reactions  . Statins Other (See Comments)    Leg cramps Leg cramps  . Neosporin + Pain Relief Max St  [Neomy-Bacit-Polymyx-Pramoxine]   . Neomycin-Polymyxin-Gramicidin Other (See Comments)    Pt states wounds do not heal with neosporin Pt states wounds do not heal with neosporin   Social History   Socioeconomic History  . Marital status: Widowed    Spouse name: Not on file  . Number of children: Not on file  . Years of education: Not on file  . Highest education level: Not on file  Occupational History  . Not on file  Tobacco Use  . Smoking status: Never Smoker  . Smokeless tobacco: Never Used  Substance and Sexual Activity  . Alcohol use: No    Alcohol/week: 0.0 standard drinks  . Drug use: No  . Sexual activity: Never  Other Topics Concern  . Not on file  Social History Narrative  . Not on file   Social Determinants of Health   Financial Resource Strain:   . Difficulty of Paying Living Expenses:   Food Insecurity:   . Worried About Charity fundraiser in the Last Year:   . Arboriculturist in the Last Year:   Transportation Needs:   . Film/video editor (Medical):   Marland Kitchen Lack of Transportation (Non-Medical):   Physical Activity:   . Days of Exercise per Week:   . Minutes of Exercise per Session:   Stress:   . Feeling of Stress :   Social Connections:   . Frequency of Communication with Friends and Family:   . Frequency of Social Gatherings with Friends and Family:   . Attends Religious Services:   . Active Member of Clubs or Organizations:   . Attends Theatre manager Meetings:   Marland Kitchen Marital Status:    Family History  Problem Relation Age of Onset  . Cancer Father        prostate  . Stroke Father   . Hypertension Sister   . Hyperlipidemia Daughter   . Heart disease Son   . Diabetes Maternal Grandmother   . Heart disease Maternal Grandmother   . Heart disease Maternal Grandfather   . Heart disease Daughter   . CAD Mother   . Failure to thrive Mother   . Breast cancer Neg Hx     Review of Systems  Constitutional: Negative.   HENT: Negative.   Respiratory: Negative.   Cardiovascular: Negative.   Gastrointestinal: Negative.   Musculoskeletal: Positive for arthralgias, gait  problem and myalgias. Negative for back pain, joint swelling, neck pain and neck stiffness.  Hematological: Negative.   Psychiatric/Behavioral: Negative.     Per HPI unless specifically indicated above     Objective:    BP (!) 144/79 (BP Location: Left Arm, Patient Position: Sitting, Cuff Size: Normal)   Pulse 96   Temp 98.3 F (36.8 C) (Oral)   Ht 4' 9.78" (1.468 m)   Wt 174 lb 3.2 oz (79 kg)   SpO2 97%   BMI 36.69 kg/m   Wt Readings from Last 3 Encounters:  04/11/20 174 lb 3.2 oz (79 kg)  11/12/19 189 lb (85.7 kg)  11/12/19 189 lb (85.7 kg)    Physical Exam Vitals and nursing note reviewed.  Constitutional:      General: She is not in acute distress.    Appearance: Normal appearance. She is not ill-appearing, toxic-appearing or diaphoretic.  HENT:     Head: Normocephalic and atraumatic.     Right Ear: External ear normal.     Left Ear: External ear normal.     Nose: Nose normal.     Mouth/Throat:     Mouth: Mucous membranes are moist.     Pharynx: Oropharynx is clear.  Eyes:     General: No scleral icterus.       Right eye: No discharge.        Left eye: No discharge.     Extraocular Movements: Extraocular movements intact.     Conjunctiva/sclera: Conjunctivae normal.     Pupils: Pupils are equal, round, and reactive to light.    Cardiovascular:     Rate and Rhythm: Normal rate and regular rhythm.     Pulses: Normal pulses.     Heart sounds: Normal heart sounds. No murmur. No friction rub. No gallop.   Pulmonary:     Effort: Pulmonary effort is normal. No respiratory distress.     Breath sounds: Normal breath sounds. No stridor. No wheezing, rhonchi or rales.  Chest:     Chest wall: No tenderness.  Musculoskeletal:        General: Normal range of motion.     Cervical back: Normal range of motion and neck supple.  Skin:    General: Skin is warm and dry.     Capillary Refill: Capillary refill takes less than 2 seconds.     Coloration: Skin is not jaundiced or pale.     Findings: No bruising, erythema, lesion or rash.  Neurological:     General: No focal deficit present.     Mental Status: She is alert and oriented to person, place, and time. Mental status is at baseline.  Psychiatric:        Mood and Affect: Mood normal.        Behavior: Behavior normal.        Thought Content: Thought content normal.        Judgment: Judgment normal.     Results for orders placed or performed in visit on 04/11/20  CoaguChek XS/INR Waived  Result Value Ref Range   INR 0.9 0.9 - 1.1   Prothrombin Time 11.3 sec      Assessment & Plan:   Problem List Items Addressed This Visit    None    Visit Diagnoses    Pre-op examination    -  Primary   Assuming her labs are normal, will clear for surgery. Will hold plavix x 7 days prior and after. Call with any concerns. Continue to  monitor.    Relevant Orders   CBC with Differential/Platelet   CoaguChek XS/INR Waived (Completed)   Basic metabolic panel   EKG [id number] (Completed)       Follow up plan: Return if symptoms worsen or fail to improve.  ADDENDUM 04/14/20 11:49AM: Labs normal. Cleared for surgery as above.

## 2020-04-12 LAB — BASIC METABOLIC PANEL
BUN/Creatinine Ratio: 11 — ABNORMAL LOW (ref 12–28)
BUN: 9 mg/dL (ref 8–27)
CO2: 22 mmol/L (ref 20–29)
Calcium: 9.5 mg/dL (ref 8.7–10.3)
Chloride: 101 mmol/L (ref 96–106)
Creatinine, Ser: 0.83 mg/dL (ref 0.57–1.00)
GFR calc Af Amer: 73 mL/min/{1.73_m2} (ref >59)
GFR calc non Af Amer: 64 mL/min/{1.73_m2} (ref >59)
Glucose: 87 mg/dL (ref 65–99)
Potassium: 5 mmol/L (ref 3.5–5.2)
Sodium: 137 mmol/L (ref 134–144)

## 2020-04-12 LAB — CBC WITH DIFFERENTIAL/PLATELET
Basophils Absolute: 0.1 10*3/uL (ref 0.0–0.2)
Basos: 1 %
EOS (ABSOLUTE): 0.2 10*3/uL (ref 0.0–0.4)
Eos: 3 %
Hematocrit: 40.5 % (ref 34.0–46.6)
Hemoglobin: 13.7 g/dL (ref 11.1–15.9)
Immature Grans (Abs): 0 10*3/uL (ref 0.0–0.1)
Immature Granulocytes: 0 %
Lymphocytes Absolute: 3 10*3/uL (ref 0.7–3.1)
Lymphs: 38 %
MCH: 34.3 pg — ABNORMAL HIGH (ref 26.6–33.0)
MCHC: 33.8 g/dL (ref 31.5–35.7)
MCV: 102 fL — ABNORMAL HIGH (ref 79–97)
Monocytes Absolute: 0.9 10*3/uL (ref 0.1–0.9)
Monocytes: 11 %
Neutrophils Absolute: 3.7 10*3/uL (ref 1.4–7.0)
Neutrophils: 47 %
Platelets: 352 10*3/uL (ref 150–450)
RBC: 3.99 x10E6/uL (ref 3.77–5.28)
RDW: 12.3 % (ref 11.7–15.4)
WBC: 8 10*3/uL (ref 3.4–10.8)

## 2020-05-05 ENCOUNTER — Other Ambulatory Visit: Payer: Self-pay | Admitting: *Deleted

## 2020-05-05 ENCOUNTER — Other Ambulatory Visit: Payer: Self-pay

## 2020-05-05 MED ORDER — TRIMETHOPRIM 100 MG PO TABS: 100.0000 mg | ORAL_TABLET | Freq: Every day | ORAL | 1 refills | Status: DC

## 2020-05-12 ENCOUNTER — Ambulatory Visit: Payer: Medicare Other | Admitting: Family Medicine

## 2020-05-16 ENCOUNTER — Ambulatory Visit: Payer: Medicare Other | Admitting: Family Medicine

## 2020-05-26 ENCOUNTER — Other Ambulatory Visit: Payer: Self-pay | Admitting: Unknown Physician Specialty

## 2020-05-26 MED ORDER — CLOPIDOGREL BISULFATE 75 MG PO TABS: 75.0000 mg | ORAL_TABLET | Freq: Every day | ORAL | 0 refills | Status: DC

## 2020-05-26 NOTE — Telephone Encounter (Signed)
Medication Refill - Medication: clopidogrel (PLAVIX) 75 MG tablet    Preferred Pharmacy (with phone number or street name):  [id number] [address], [place] Phone:  [phone]  Fax:  [phone]       Agent: Please be advised that RX refills may take up to 3 business days. We ask that you follow-up with your pharmacy.

## 2020-06-16 ENCOUNTER — Other Ambulatory Visit: Payer: Self-pay

## 2020-06-16 ENCOUNTER — Encounter: Payer: Self-pay | Admitting: Family Medicine

## 2020-06-16 ENCOUNTER — Ambulatory Visit (INDEPENDENT_AMBULATORY_CARE_PROVIDER_SITE_OTHER): Payer: Medicare Other | Admitting: Family Medicine

## 2020-06-16 ENCOUNTER — Other Ambulatory Visit: Payer: Self-pay | Admitting: Family Medicine

## 2020-06-16 VITALS — BP 123/78 | HR 79 | Temp 98.5°F | Wt 173.6 lb

## 2020-06-16 DIAGNOSIS — I10 Essential (primary) hypertension: Secondary | ICD-10-CM | POA: Diagnosis not present

## 2020-06-16 DIAGNOSIS — E7800 Pure hypercholesterolemia, unspecified: Secondary | ICD-10-CM | POA: Diagnosis not present

## 2020-06-16 DIAGNOSIS — K219 Gastro-esophageal reflux disease without esophagitis: Secondary | ICD-10-CM

## 2020-06-16 DIAGNOSIS — R300 Dysuria: Secondary | ICD-10-CM

## 2020-06-16 DIAGNOSIS — E039 Hypothyroidism, unspecified: Secondary | ICD-10-CM | POA: Diagnosis not present

## 2020-06-16 DIAGNOSIS — Z789 Other specified health status: Secondary | ICD-10-CM

## 2020-06-16 DIAGNOSIS — N183 Chronic kidney disease, stage 3 unspecified: Secondary | ICD-10-CM

## 2020-06-16 LAB — MICROSCOPIC EXAMINATION

## 2020-06-16 LAB — URINALYSIS, ROUTINE W REFLEX MICROSCOPIC
Bilirubin, UA: NEGATIVE
Glucose, UA: NEGATIVE
Ketones, UA: NEGATIVE
Nitrite, UA: NEGATIVE
Specific Gravity, UA: 1.02 (ref 1.005–1.030)
Urobilinogen, Ur: 0.2 mg/dL (ref 0.2–1.0)
pH, UA: 5.5 (ref 5.0–7.5)

## 2020-06-16 MED ORDER — LEVOTHYROXINE SODIUM 50 MCG PO TABS: 50.0000 ug | ORAL_TABLET | Freq: Every day | ORAL | 1 refills | Status: DC

## 2020-06-16 MED ORDER — NITROFURANTOIN MONOHYD MACRO 100 MG PO CAPS: 100.0000 mg | ORAL_CAPSULE | Freq: Two times a day (BID) | ORAL | 0 refills | Status: DC

## 2020-06-16 MED ORDER — GABAPENTIN 100 MG PO CAPS
ORAL_CAPSULE | ORAL | 3 refills | Status: DC
Start: 2020-06-16 — End: 2020-11-12

## 2020-06-16 NOTE — Telephone Encounter (Signed)
Copied from [place] [id number]. Topic: Quick Communication - Rx Refill/Question >> Jun 16, 2020  4:50 PM Mcneil, Ja-Kwan wrote: Medication: levothyroxine (SYNTHROID) 50 MCG tablet  Has the patient contacted their pharmacy? no  Preferred Pharmacy (with phone number or street name): [facility] [address], [place] Phone: [phone]  Fax: [phone]  Agent: Please be advised that RX refills may take up to 3 business days. We ask that you follow-up with your pharmacy.

## 2020-06-16 NOTE — Telephone Encounter (Signed)
Requested Prescriptions  Pending Prescriptions Disp Refills  . levothyroxine (SYNTHROID) 50 MCG tablet 90 tablet 1    Sig: Take 1 tablet (50 mcg total) by mouth at bedtime.     Endocrinology:  Hypothyroid Agents Failed - 06/16/2020  4:53 PM      Failed - TSH needs to be rechecked within 3 months after an abnormal result. Refill until TSH is due.      Passed - TSH in normal range and within 360 days    TSH  Date Value Ref Range Status  11/12/2019 3.790 0.450 - 4.500 uIU/mL Final         Passed - Valid encounter within last 12 months    Recent Outpatient Visits          Today Dysuria   [facility] [place], [place], DO   2 months ago Pre-op examination   [place], Megan P, DO   7 months ago Routine general medical examination at a health care facility   [place], [place], DO   1 year ago Essential hypertension   [place], Megan P, DO   1 year ago Upper respiratory tract infection, unspecified type   [facility], Lilia Argue, [place]      Future Appointments            In 2 weeks MacDiarmid, Nicki Reaper, MD [place]   In 5 months  [facility], [place]

## 2020-06-16 NOTE — Progress Notes (Signed)
BP 123/78 (BP Location: Left Arm, Patient Position: Sitting, Cuff Size: Normal)   Pulse 79   Temp 98.5 F (36.9 C) (Oral)   Wt 173 lb 9.6 oz (78.7 kg)   SpO2 99%   BMI 36.56 kg/m    Subjective:    Patient ID: Sarah Freeman, female    DOB: 25-Feb-1931, 86 y.o.   MRN: [id number]  HPI: Sarah Freeman is a 86 y.o. female  Chief Complaint  Patient presents with  . Urinary Tract Infection   Has been having lumbar radiculopathy in her L leg. She is seeing [facility]. She is not getting better with it. They started her on some pain medicine to help with her symptoms and she has had some constipation as well. Has been off her gabapentin. She is not sure why she stopped it or when she stopped it.   URINARY SYMPTOMS Duration: Friday Dysuria: burning Urinary frequency: yes Urgency: no Small volume voids: yes Symptom severity: moderate Urinary incontinence: no Foul odor: no Hematuria: no Abdominal pain: no Back pain: yes Suprapubic pain/pressure: no Flank pain: no Fever:  no Vomiting: no Relief with cranberry juice: no Relief with pyridium: no Status: better/worse/stable Previous urinary tract infection: yes vaginal discharge: no Treatments attempted: increasing fluids   HYPERTENSION / HYPERLIPIDEMIA Satisfied with current treatment? yes Duration of hypertension: chronic BP monitoring frequency: not checking BP medication side effects: no Duration of hyperlipidemia: chronic Cholesterol medication side effects: yes Cholesterol supplements: none Past cholesterol medications: zetia Medication compliance: good compliance Aspirin: no Recent stressors: yes Recurrent headaches: no Visual changes: no Palpitations: no Dyspnea: no Chest pain: no Lower extremity edema: no Dizzy/lightheaded: no  HYPOTHYROIDISM Thyroid control status:stable Satisfied with current treatment? yes Medication side effects: no Medication compliance: excellent compliance Recent dose  adjustment:no Fatigue: yes Cold intolerance: no Heat intolerance: no Weight gain: yes Weight loss: no Constipation: yes Diarrhea/loose stools: no Palpitations: no Lower extremity edema: no Anxiety/depressed mood: no  Relevant past medical, surgical, family and social history reviewed and updated as indicated. Interim medical history since our last visit reviewed. Allergies and medications reviewed and updated.  Review of Systems  Constitutional: Negative.   Respiratory: Negative.   Cardiovascular: Negative.   Gastrointestinal: Negative.   Musculoskeletal: Negative.   Neurological: Negative.   Psychiatric/Behavioral: Negative.     Per HPI unless specifically indicated above     Objective:    BP 123/78 (BP Location: Left Arm, Patient Position: Sitting, Cuff Size: Normal)   Pulse 79   Temp 98.5 F (36.9 C) (Oral)   Wt 173 lb 9.6 oz (78.7 kg)   SpO2 99%   BMI 36.56 kg/m   Wt Readings from Last 3 Encounters:  06/16/20 173 lb 9.6 oz (78.7 kg)  04/11/20 174 lb 3.2 oz (79 kg)  11/12/19 189 lb (85.7 kg)    Physical Exam Vitals and nursing note reviewed.  Constitutional:      General: She is not in acute distress.    Appearance: Normal appearance. She is not ill-appearing, toxic-appearing or diaphoretic.  HENT:     Head: Normocephalic and atraumatic.     Right Ear: External ear normal.     Left Ear: External ear normal.     Nose: Nose normal.     Mouth/Throat:     Mouth: Mucous membranes are moist.     Pharynx: Oropharynx is clear.  Eyes:     General: No scleral icterus.       Right eye: No discharge.  Left eye: No discharge.     Extraocular Movements: Extraocular movements intact.     Conjunctiva/sclera: Conjunctivae normal.     Pupils: Pupils are equal, round, and reactive to light.  Cardiovascular:     Rate and Rhythm: Normal rate and regular rhythm.     Pulses: Normal pulses.     Heart sounds: Normal heart sounds. No murmur heard.  No friction rub.  No gallop.   Pulmonary:     Effort: Pulmonary effort is normal. No respiratory distress.     Breath sounds: Normal breath sounds. No stridor. No wheezing, rhonchi or rales.  Chest:     Chest wall: No tenderness.  Musculoskeletal:        General: Normal range of motion.     Cervical back: Normal range of motion and neck supple.  Skin:    General: Skin is warm and dry.     Capillary Refill: Capillary refill takes less than 2 seconds.     Coloration: Skin is not jaundiced or pale.     Findings: No bruising, erythema, lesion or rash.  Neurological:     General: No focal deficit present.     Mental Status: She is alert and oriented to person, place, and time. Mental status is at baseline.  Psychiatric:        Mood and Affect: Mood normal.        Behavior: Behavior normal.        Thought Content: Thought content normal.        Judgment: Judgment normal.     Results for orders placed or performed in visit on 06/16/20  Microscopic Examination   Urine  Result Value Ref Range   WBC, UA 11-30 (A) 0 - 5 /hpf   RBC 0-2 0 - 2 /hpf   Epithelial Cells (non renal) 0-10 0 - 10 /hpf   Bacteria, UA Moderate (A) None seen/Few  Urinalysis, Routine w reflex microscopic  Result Value Ref Range   Specific Gravity, UA 1.020 1.005 - 1.030   pH, UA 5.5 5.0 - 7.5   Color, UA Yellow Yellow   Appearance Ur Clear Clear   Leukocytes,UA 2+ (A) Negative   Protein,UA 1+ (A) Negative/Trace   Glucose, UA Negative Negative   Ketones, UA Negative Negative   RBC, UA Trace (A) Negative   Bilirubin, UA Negative Negative   Urobilinogen, Ur 0.2 0.2 - 1.0 mg/dL   Nitrite, UA Negative Negative   Microscopic Examination See below:   CBC with Differential/Platelet  Result Value Ref Range   WBC 7.5 3.4 - 10.8 x10E3/uL   RBC 3.55 (L) 3.77 - 5.28 x10E6/uL   Hemoglobin 12.1 11.1 - 15.9 g/dL   Hematocrit 36.0 34.0 - 46.6 %   MCV 101 (H) 79 - 97 fL   MCH 34.1 (H) 26.6 - 33.0 pg   MCHC 33.6 31 - 35 g/dL   RDW 11.9  11.7 - 15.4 %   Platelets 320 150 - 450 x10E3/uL   Neutrophils 45 Not Estab. %   Lymphs 39 Not Estab. %   Monocytes 9 Not Estab. %   Eos 5 Not Estab. %   Basos 2 Not Estab. %   Neutrophils Absolute 3.4 1 - 7 x10E3/uL   Lymphocytes Absolute 2.9 0 - 3 x10E3/uL   Monocytes Absolute 0.6 0 - 0 x10E3/uL   EOS (ABSOLUTE) 0.4 0.0 - 0.4 x10E3/uL   Basophils Absolute 0.1 0 - 0 x10E3/uL   Immature Granulocytes 0 Not Estab. %  Immature Grans (Abs) 0.0 0.0 - 0.1 x10E3/uL  Comprehensive metabolic panel  Result Value Ref Range   Glucose 100 (H) 65 - 99 mg/dL   BUN 11 8 - 27 mg/dL   Creatinine, Ser 0.80 0.57 - 1.00 mg/dL   GFR calc non Af Amer 67 >59 mL/min/1.73   GFR calc Af Amer 77 >59 mL/min/1.73   BUN/Creatinine Ratio 14 12 - 28   Sodium 132 (L) 134 - 144 mmol/L   Potassium 4.5 3.5 - 5.2 mmol/L   Chloride 102 96 - 106 mmol/L   CO2 20 20 - 29 mmol/L   Calcium 8.4 (L) 8.7 - 10.3 mg/dL   Total Protein 6.3 6.0 - 8.5 g/dL   Albumin 3.8 3.6 - 4.6 g/dL   Globulin, Total 2.5 1.5 - 4.5 g/dL   Albumin/Globulin Ratio 1.5 1.2 - 2.2   Bilirubin Total 0.2 0.0 - 1.2 mg/dL   Alkaline Phosphatase 58 48 - 121 IU/L   AST 16 0 - 40 IU/L   ALT 18 0 - 32 IU/L  Lipid Panel w/o Chol/HDL Ratio  Result Value Ref Range   Cholesterol, Total 198 100 - 199 mg/dL   Triglycerides 218 (H) 0 - 149 mg/dL   HDL 46 >39 mg/dL   VLDL Cholesterol Cal 38 5 - 40 mg/dL   LDL Chol Calc (NIH) 114 (H) 0 - 99 mg/dL  Microalbumin, Urine Waived  Result Value Ref Range   Microalb, Ur Waived 150 (H) 0 - 19 mg/L   Creatinine, Urine Waived 200 10 - 300 mg/dL   Microalb/Creat Ratio 30-300 (H) <30 mg/g  TSH  Result Value Ref Range   TSH 4.030 0.450 - 4.500 uIU/mL      Assessment & Plan:   Problem List Items Addressed This Visit      Cardiovascular and Mediastinum   HTN (hypertension) - Primary    Under good control on current regimen. Continue current regimen. Continue to monitor. Call with any concerns. Refills given. Labs  drawn today.       Relevant Orders   Comprehensive metabolic panel (Completed)   Microalbumin, Urine Waived (Completed)     Digestive   GERD (gastroesophageal reflux disease)    Under good control on current regimen. Continue current regimen. Continue to monitor. Call with any concerns. Refills given. Labs drawn today.       Relevant Orders   CBC with Differential/Platelet (Completed)   Comprehensive metabolic panel (Completed)     Endocrine   Hypothyroidism    Rechecking labs today. Await results. Treat as needed. Call with any concerns.       Relevant Orders   Comprehensive metabolic panel (Completed)   TSH (Completed)     Genitourinary   Chronic kidney disease, stage 3    Rechecking labs today. Await results. Treat as needed.       Relevant Orders   Comprehensive metabolic panel (Completed)     Other   Hypercholesterolemia    Restart zetia. Call with any concerns. Continue to monitor.       Relevant Orders   Comprehensive metabolic panel (Completed)   Lipid Panel w/o Chol/HDL Ratio (Completed)   Statin intolerance    Off her zetia for unclear reason. Will restart it. Call with any concerns. Labs drawn. Refills given.       Morbid obesity ([place])    Encouraged diet and exercise with goal of losing 1-2lbs per week. Call with any concerns.        Other Visit  Diagnoses    Dysuria       +UTI- will treat. Call with any concerns.    Relevant Orders   Urinalysis, Routine w reflex microscopic (Completed)   Comprehensive metabolic panel (Completed)       Follow up plan: Return in about 4 weeks (around 07/14/2020) for follow up on gabapentin.

## 2020-06-17 LAB — CBC WITH DIFFERENTIAL/PLATELET
Basophils Absolute: 0.1 10*3/uL (ref 0.0–0.2)
Basos: 2 %
EOS (ABSOLUTE): 0.4 10*3/uL (ref 0.0–0.4)
Eos: 5 %
Hematocrit: 36 % (ref 34.0–46.6)
Hemoglobin: 12.1 g/dL (ref 11.1–15.9)
Immature Grans (Abs): 0 10*3/uL (ref 0.0–0.1)
Immature Granulocytes: 0 %
Lymphocytes Absolute: 2.9 10*3/uL (ref 0.7–3.1)
Lymphs: 39 %
MCH: 34.1 pg — ABNORMAL HIGH (ref 26.6–33.0)
MCHC: 33.6 g/dL (ref 31.5–35.7)
MCV: 101 fL — ABNORMAL HIGH (ref 79–97)
Monocytes Absolute: 0.6 10*3/uL (ref 0.1–0.9)
Monocytes: 9 %
Neutrophils Absolute: 3.4 10*3/uL (ref 1.4–7.0)
Neutrophils: 45 %
Platelets: 320 10*3/uL (ref 150–450)
RBC: 3.55 x10E6/uL — ABNORMAL LOW (ref 3.77–5.28)
RDW: 11.9 % (ref 11.7–15.4)
WBC: 7.5 10*3/uL (ref 3.4–10.8)

## 2020-06-17 LAB — LIPID PANEL W/O CHOL/HDL RATIO
Cholesterol, Total: 198 mg/dL (ref 100–199)
HDL: 46 mg/dL (ref >39)
LDL Chol Calc (NIH): 114 mg/dL — ABNORMAL HIGH (ref 0–99)
Triglycerides: 218 mg/dL — ABNORMAL HIGH (ref 0–149)
VLDL Cholesterol Cal: 38 mg/dL (ref 5–40)

## 2020-06-17 LAB — TSH: TSH: 4.03 u[IU]/mL (ref 0.450–4.500)

## 2020-06-17 LAB — COMPREHENSIVE METABOLIC PANEL
ALT: 18 IU/L (ref 0–32)
AST: 16 IU/L (ref 0–40)
Albumin/Globulin Ratio: 1.5 (ref 1.2–2.2)
Albumin: 3.8 g/dL (ref 3.6–4.6)
Alkaline Phosphatase: 58 IU/L (ref 48–121)
BUN/Creatinine Ratio: 14 (ref 12–28)
BUN: 11 mg/dL (ref 8–27)
Bilirubin Total: 0.2 mg/dL (ref 0.0–1.2)
CO2: 20 mmol/L (ref 20–29)
Calcium: 8.4 mg/dL — ABNORMAL LOW (ref 8.7–10.3)
Chloride: 102 mmol/L (ref 96–106)
Creatinine, Ser: 0.8 mg/dL (ref 0.57–1.00)
GFR calc Af Amer: 77 mL/min/{1.73_m2} (ref >59)
GFR calc non Af Amer: 67 mL/min/{1.73_m2} (ref >59)
Globulin, Total: 2.5 g/dL (ref 1.5–4.5)
Glucose: 100 mg/dL — ABNORMAL HIGH (ref 65–99)
Potassium: 4.5 mmol/L (ref 3.5–5.2)
Sodium: 132 mmol/L — ABNORMAL LOW (ref 134–144)
Total Protein: 6.3 g/dL (ref 6.0–8.5)

## 2020-06-17 LAB — MICROALBUMIN, URINE WAIVED
Creatinine, Urine Waived: 200 mg/dL (ref 10–300)
Microalb, Ur Waived: 150 mg/L — ABNORMAL HIGH (ref 0–19)

## 2020-06-20 NOTE — Assessment & Plan Note (Signed)
Off her zetia for unclear reason. Will restart it. Call with any concerns. Labs drawn. Refills given.

## 2020-06-20 NOTE — Assessment & Plan Note (Signed)
Encouraged diet and exercise with goal of losing 1-2lbs per week. Call with any concerns.  

## 2020-06-20 NOTE — Assessment & Plan Note (Signed)
Rechecking labs today. Await results. Treat as needed. Call with any concerns.  

## 2020-06-20 NOTE — Assessment & Plan Note (Signed)
Under good control on current regimen. Continue current regimen. Continue to monitor. Call with any concerns. Refills given. Labs drawn today.   

## 2020-06-20 NOTE — Assessment & Plan Note (Signed)
Rechecking labs today. Await results. Treat as needed.  °

## 2020-06-20 NOTE — Assessment & Plan Note (Signed)
Restart zetia. Call with any concerns. Continue to monitor.

## 2020-06-30 ENCOUNTER — Ambulatory Visit (INDEPENDENT_AMBULATORY_CARE_PROVIDER_SITE_OTHER): Payer: Medicare Other | Admitting: Urology

## 2020-06-30 ENCOUNTER — Other Ambulatory Visit: Payer: Self-pay

## 2020-06-30 VITALS — BP 109/68 | HR 83 | Ht <= 58 in | Wt 173.0 lb

## 2020-06-30 DIAGNOSIS — N3020 Other chronic cystitis without hematuria: Secondary | ICD-10-CM | POA: Diagnosis not present

## 2020-06-30 LAB — URINALYSIS, COMPLETE
Bilirubin, UA: NEGATIVE
Glucose, UA: NEGATIVE
Ketones, UA: NEGATIVE
Nitrite, UA: NEGATIVE
Protein,UA: NEGATIVE
Specific Gravity, UA: 1.015 (ref 1.005–1.030)
Urobilinogen, Ur: 0.2 mg/dL (ref 0.2–1.0)
pH, UA: 6 (ref 5.0–7.5)

## 2020-06-30 LAB — MICROSCOPIC EXAMINATION

## 2020-06-30 MED ORDER — NITROFURANTOIN MACROCRYSTAL 100 MG PO CAPS: 100.0000 mg | ORAL_CAPSULE | Freq: Once | ORAL | 11 refills | Status: AC

## 2020-06-30 NOTE — Progress Notes (Signed)
06/30/2020 8:59 AM   Sarah Freeman [place] 04-26-1931 [id number]  Referring provider: Kathrine Haddock, NP [address],  [place] [address]  Chief Complaint  Patient presents with  . Urinary Incontinence    HPI: Iwas consulted to assess the patient's worsening urinary incontinence over many months.She leaks with coughing sneezing bending and lifting. She has urge incontinence and no bedwetting. She wears 1 pad a day. She will wear 2 pads if active. She soaks more urine if she has urgency.  She voids 4 or 5 times at night. She voids every 2 hours during the day.   Myrbetriq did not help.  Patient had a positive urine culture in January and February 2019.Recent renal ultrasound was normal The patient was started on trimethoprim and was also given Vesicare.  When I saw her last time she was infection free and did not want to go back on Vesicare for persisting urge incontinence. Overall it was improved. He has been having cardiac issues  Today The history was a little bit difficult bladder and the patient is on daily trimethoprim but twice in the last year has had bladder infections treated with dysuria and achiness.  It looks like she is currently on Macrodantin for infection.  Frequency is stable.  Urge incontinence stable  I could not find any cultures in the medical record in the last many month       PMH: Past Medical History:  Diagnosis Date  . Hyperlipidemia   . Hypothyroid   . Stroke ([name])   . Urinary incontinence     Surgical History: Past Surgical History:  Procedure Laterality Date  . ABDOMINAL HYSTERECTOMY     age 86  . BREAST BIOPSY Left 2000?   neg. dr. Dwyane Luo office  . CHOLECYSTECTOMY  1964  . COLONOSCOPY     Dr Nicolasa Ducking  . DILATION AND CURETTAGE OF UTERUS    . EYE SURGERY    . LOOP RECORDER INSERTION N/A 02/09/2018   Procedure: LOOP RECORDER INSERTION;  Surgeon: Deboraha Sprang, MD;  Location: [facility];  Service:  Cardiovascular;  Laterality: N/A;  . SKIN CANCER EXCISION     face  . TEE WITHOUT CARDIOVERSION N/A 02/09/2018   Procedure: TRANSESOPHAGEAL ECHOCARDIOGRAM (TEE);  Surgeon: Minna Merritts, MD;  Location: [facility];  Service: Cardiovascular;  Laterality: N/A;    Home Medications:  Allergies as of 06/30/2020      Reactions   Statins Other (See Comments)   Leg cramps Leg cramps   Bacitracin    Neomycin    Neosporin + Pain Relief Max St  [neomy-bacit-polymyx-pramoxine]    Polymyxin B    Neomycin-polymyxin-gramicidin Other (See Comments)   Pt states wounds do not heal with neosporin Pt states wounds do not heal with neosporin      Medication List       Accurate as of June 30, 2020  8:59 AM. If you have any questions, ask your nurse or doctor.        STOP taking these medications   Acetaminophen-Codeine 300-15 MG Tabs Stopped by: Reece Packer, MD     TAKE these medications   clopidogrel 75 MG tablet Commonly known as: PLAVIX Take 1 tablet (75 mg total) by mouth daily.   diphenhydramine-acetaminophen 25-500 MG Tabs tablet Commonly known as: TYLENOL PM Take 1 tablet by mouth at bedtime as needed (for sleep).   esomeprazole 40 MG capsule Commonly known as: NEXIUM Take 1 capsule (40 mg total) by mouth daily.  gabapentin 100 MG capsule Commonly known as: NEURONTIN 1 pill qHS for 1 week, then 1 pill in the AM and 1 pill qHS for 1 week, then 1 pill 3x a day until you see me   HYDROcodone-acetaminophen 5-325 MG tablet Commonly known as: NORCO/VICODIN Take 1 tablet by mouth every 8 (eight) hours as needed.   levothyroxine 50 MCG tablet Commonly known as: SYNTHROID Take 1 tablet (50 mcg total) by mouth at bedtime.   nitrofurantoin (macrocrystal-monohydrate) 100 MG capsule Commonly known as: Macrobid Take 1 capsule (100 mg total) by mouth 2 (two) times daily.   Prolia 60 MG/ML Sosy injection Generic drug: denosumab Inject into the skin.   RED YEAST RICE  PO Take 1 capsule by mouth daily.   trimethoprim 100 MG tablet Commonly known as: TRIMPEX Take 1 tablet (100 mg total) by mouth daily.   Tylenol Arthritis Pain 650 MG CR tablet Generic drug: acetaminophen Take 650 mg by mouth every 8 (eight) hours as needed for pain.       Allergies:  Allergies  Allergen Reactions  . Statins Other (See Comments)    Leg cramps Leg cramps  . Bacitracin   . Neomycin   . Neosporin + Pain Relief Max St  [Neomy-Bacit-Polymyx-Pramoxine]   . Polymyxin B   . Neomycin-Polymyxin-Gramicidin Other (See Comments)    Pt states wounds do not heal with neosporin Pt states wounds do not heal with neosporin    Family History: Family History  Problem Relation Age of Onset  . Cancer Father        prostate  . Stroke Father   . Hypertension Sister   . Hyperlipidemia Daughter   . Heart disease Son   . Diabetes Maternal Grandmother   . Heart disease Maternal Grandmother   . Heart disease Maternal Grandfather   . Heart disease Daughter   . CAD Mother   . Failure to thrive Mother   . Breast cancer Neg Hx     Social History:  reports that she has never smoked. She has never used smokeless tobacco. She reports that she does not drink alcohol and does not use drugs.  ROS:                                        Physical Exam: BP 109/68   Pulse 83   Ht 4\' 8"  (1.422 m)   Wt 78.5 kg   BMI 38.79 kg/m   Constitutional:  Alert and oriented, No acute distress.  Laboratory Data: Lab Results  Component Value Date   WBC 7.5 06/16/2020   HGB 12.1 06/16/2020   HCT 36.0 06/16/2020   MCV 101 (H) 06/16/2020   PLT 320 06/16/2020    Lab Results  Component Value Date   CREATININE 0.80 06/16/2020    No results found for: PSA  No results found for: TESTOSTERONE  Lab Results  Component Value Date   HGBA1C 6.1 (H) 01/18/2018    Urinalysis    Component Value Date/Time   COLORURINE YELLOW (A) 01/18/2018 1925   APPEARANCEUR  Clear 06/16/2020 1437   LABSPEC 1.006 01/18/2018 1925   PHURINE 7.0 01/18/2018 1925   GLUCOSEU Negative 06/16/2020 1437   HGBUR NEGATIVE 01/18/2018 1925   BILIRUBINUR Negative 06/16/2020 Sarah Freeman 01/18/2018 1925   PROTEINUR 1+ (A) 06/16/2020 1437   PROTEINUR NEGATIVE 01/18/2018 1925   NITRITE Negative 06/16/2020 1437  NITRITE NEGATIVE 01/18/2018 1925   LEUKOCYTESUR 2+ (A) 06/16/2020 1437    Pertinent Imaging:   Assessment & Plan: Patient may or may not be having breakthrough infections but her symptoms do sound like cystitis that generally respond to an antibiotic.  I thought it was reasonable to change her to daily Macrodantin and have her come back in 4 months and check on her.  I would call her if the urine culture today is positive  There are no diagnoses linked to this encounter.  No follow-ups on file.  Reece Packer, MD  [facility] [address] [place], [place] [address] [phone]

## 2020-07-01 ENCOUNTER — Telehealth: Payer: Self-pay

## 2020-07-01 NOTE — Telephone Encounter (Signed)
Pt calls and states that she is unable to afford medication that MacDiarmid sent in for her yesterday. I see that Macrobid was called in but then it appears it was discontinued? Can you please clarify and let pt know.

## 2020-07-02 NOTE — Telephone Encounter (Signed)
Keflex 250 mg daily 30 x 11

## 2020-07-02 NOTE — Telephone Encounter (Signed)
Looks like it was Macrodantin was called it, PT is saying the medication is too expensive to fill. Is there something else we can send in?

## 2020-07-03 LAB — CULTURE, URINE COMPREHENSIVE

## 2020-07-03 MED ORDER — CEPHALEXIN 250 MG PO CAPS: 250.0000 mg | ORAL_CAPSULE | Freq: Every day | ORAL | 11 refills | Status: DC

## 2020-07-03 NOTE — Telephone Encounter (Signed)
New script sent in, patient notified on vmail

## 2020-07-11 ENCOUNTER — Emergency Department
Admission: EM | Admit: 2020-07-11 | Discharge: 2020-07-11 | Disposition: A | Discharge status: Home / Self Care | Payer: Medicare Other | Attending: Emergency Medicine | Admitting: Emergency Medicine

## 2020-07-11 DIAGNOSIS — Z5321 Procedure and treatment not carried out due to patient leaving prior to being seen by health care provider: Secondary | ICD-10-CM | POA: Diagnosis not present

## 2020-07-11 DIAGNOSIS — K5900 Constipation, unspecified: Secondary | ICD-10-CM | POA: Diagnosis present

## 2020-07-11 NOTE — ED Notes (Signed)
First Nurse Note: Pt to ED via POV c/o constipation. Pt also requesting something for pain. Pt is in NAD.

## 2020-08-28 ENCOUNTER — Other Ambulatory Visit: Payer: Self-pay | Admitting: Family Medicine

## 2020-09-18 ENCOUNTER — Telehealth: Payer: Self-pay | Admitting: Cardiovascular Disease

## 2020-09-18 NOTE — Telephone Encounter (Signed)
Patient last seen by Dr. Rockey Situ on 10/22/19.  History of CVA/ bilateral carotid stenosis/ HLD. She had a linq implanted by Dr. Caryl Comes on 02/09/18.   Linqs are MRI compatible.  I am not seeing that she had any stents placed.   To Dr. Rockey Situ to review.   The patient is currently due for follow up with Dr. Rockey Situ, but no appointments are currently scheduled for her in our office.

## 2020-09-18 NOTE — Telephone Encounter (Signed)
[facility] calling  Dr. George Ina is wanting to know if patient would be able to have an MRI  States there are some concerns due to stents or other devices Please review and call to discuss with Dr George Ina at [phone]

## 2020-09-21 NOTE — Telephone Encounter (Signed)
With Linq being compatible, do not see reason why she could not have MRI

## 2020-09-22 ENCOUNTER — Other Ambulatory Visit: Payer: Self-pay | Admitting: Ophthalmology

## 2020-09-22 ENCOUNTER — Other Ambulatory Visit: Payer: Self-pay

## 2020-09-22 ENCOUNTER — Encounter: Payer: Self-pay | Admitting: Physician Assistant

## 2020-09-22 ENCOUNTER — Ambulatory Visit (INDEPENDENT_AMBULATORY_CARE_PROVIDER_SITE_OTHER): Payer: Medicare Other | Admitting: Physician Assistant

## 2020-09-22 VITALS — BP 110/70 | HR 72 | Ht <= 58 in | Wt 174.0 lb

## 2020-09-22 DIAGNOSIS — I6523 Occlusion and stenosis of bilateral carotid arteries: Secondary | ICD-10-CM

## 2020-09-22 DIAGNOSIS — E785 Hyperlipidemia, unspecified: Secondary | ICD-10-CM

## 2020-09-22 DIAGNOSIS — G459 Transient cerebral ischemic attack, unspecified: Secondary | ICD-10-CM

## 2020-09-22 DIAGNOSIS — Z959 Presence of cardiac and vascular implant and graft, unspecified: Secondary | ICD-10-CM

## 2020-09-22 DIAGNOSIS — I639 Cerebral infarction, unspecified: Secondary | ICD-10-CM | POA: Diagnosis not present

## 2020-09-22 DIAGNOSIS — Z6839 Body mass index (BMI) 39.0-39.9, adult: Secondary | ICD-10-CM

## 2020-09-22 DIAGNOSIS — H4922 Sixth [abducent] nerve palsy, left eye: Secondary | ICD-10-CM

## 2020-09-22 NOTE — Telephone Encounter (Signed)
Left voicemail message to call back for both patient and provider.

## 2020-09-22 NOTE — Progress Notes (Signed)
Office Visit    Patient Name: Sarah Freeman Date of Encounter: 09/22/2020  Primary Care Provider:  Valerie Roys, DO Primary Cardiologist:  Ida Rogue, MD  Chief Complaint    Chief Complaint  Patient presents with  . OTHER    Annual follow up and need cardiac clearance to have MRI done since she has been having double vision. Medications verbally reviewed with patient.     86 yo female with history of HFpEF, b/l carotid artery dz, aortic atherosclerosis, stroke / watershed infarct 2 years ago, distribution infarcts in the R MCA, UTI, HLD, and who presents today in preparation for MRI and 1 year follow-up.   Past Medical History    Past Medical History:  Diagnosis Date  . Hyperlipidemia   . Hypothyroid   . Stroke ([place])   . Urinary incontinence    Past Surgical History:  Procedure Laterality Date  . ABDOMINAL HYSTERECTOMY     age 46  . BREAST BIOPSY Left 2000?   neg. dr. Dwyane Luo office  . CHOLECYSTECTOMY  1964  . COLONOSCOPY     Dr Nicolasa Ducking  . DILATION AND CURETTAGE OF UTERUS    . EYE SURGERY    . LOOP RECORDER INSERTION N/A 02/09/2018   Procedure: LOOP RECORDER INSERTION;  Surgeon: Deboraha Sprang, MD;  Location: [facility];  Service: Cardiovascular;  Laterality: N/A;  . SKIN CANCER EXCISION     face  . TEE WITHOUT CARDIOVERSION N/A 02/09/2018   Procedure: TRANSESOPHAGEAL ECHOCARDIOGRAM (TEE);  Surgeon: Minna Merritts, MD;  Location: [facility];  Service: Cardiovascular;  Laterality: N/A;    Allergies  Allergies  Allergen Reactions  . Statins Other (See Comments)    Leg cramps Leg cramps  . Bacitracin   . Neomycin   . Neosporin + Pain Relief Max St  [Neomy-Bacit-Polymyx-Pramoxine]   . Polymyxin B   . Neomycin-Polymyxin-Gramicidin Other (See Comments)    Pt states wounds do not heal with neosporin Pt states wounds do not heal with neosporin    History of Present Illness    Sarah Freeman is a 86 y.o. female with PMH as above. She  has history of stroke with loop monitor implanted after cardiology referral placed by neurology.   She was seen by neurology 02/12/2019 and continued on her current medications.  Follow-up was scheduled within 1 to 2 years.  She was last seen by her primary cardiologist, Dr. Rockey Situ, 10/22/2019. At that time, loop monitor reviewed and showed rare SVT but no atrial fibrillation. She was taking Plavix for her CVA. Statin was recommended but declined. She was started on Zetia.   02/2020 device check without Afib.   On 04/08/2020, our office was contacted regarding preoperative evaluation.  It was noted that a loop recorder was in place and being monitored.  Based on review of pt sx / EMR, RCRI 0.9% indicating low risk for CV complications based on cardiac risk factors for any procedures.  Age was noted to be the biggest risk factor.  It was noted she denied any chest pain, shortness of breath, palpitations, or syncope.  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  She reported some back issues, which were her limiting factor.  Based on ACC/AHA guidelines, she was deemed acceptable risk without further cardiovascular testing.  It was noted that we were asked to provide input on Plavix; however, as she was not prescribed this for cardiovascular reasons but rather neurologic/stroke etiology, no input  was provided.  The device clinic was contacted regarding her loop recorder.  On 09/18/2020, [facility] called regarding obtaining an MRI.  On review of further documentation, her primary cardiologist, Dr. Rockey Situ, then responded that the  linq implanted by Dr. Caryl Comes 02/09/2018 was MRI compatible.  She could proceed to MRI without further recommendations per review of Dr. Rockey Situ documentation.  This was communicated to [facility].  Today, 09/22/2020, she returns to clinic and reports that she is doing well from a cardiac standpoint. She denies chest pain, palpitations, dyspnea, pnd,  orthopnea, n, v, syncope, edema, weight gain, or early satiety.  No signs or symptoms of bleeding.  She reports medication compliance.  BP well controlled today, as well as heart rate.  She reports her main concern is to complete her cardiac MRI.  She states that she still requires documentation from our office regarding her linq device and proceeding to cardiac MRI.  Previous phone documentation was reviewed with patient.   Home Medications    Current Outpatient Medications on File Prior to Visit  Medication Sig Dispense Refill  . acetaminophen (TYLENOL ARTHRITIS PAIN) 650 MG CR tablet Take 650 mg by mouth every 8 (eight) hours as needed for pain.    . cephALEXin (KEFLEX) 250 MG capsule Take 1 capsule (250 mg total) by mouth daily. 30 capsule 11  . clopidogrel (PLAVIX) 75 MG tablet TAKE 1 TABLET(75 MG) BY MOUTH DAILY 90 tablet 0  . diphenhydramine-acetaminophen (TYLENOL PM) 25-500 MG TABS tablet Take 1 tablet by mouth at bedtime as needed (for sleep).     Marland Kitchen esomeprazole (NEXIUM) 40 MG capsule Take 1 capsule (40 mg total) by mouth daily. 90 capsule 1  . gabapentin (NEURONTIN) 100 MG capsule 1 pill qHS for 1 week, then 1 pill in the AM and 1 pill qHS for 1 week, then 1 pill 3x a day until you see me 90 capsule 3  . HYDROcodone-acetaminophen (NORCO/VICODIN) 5-325 MG tablet Take 1 tablet by mouth every 8 (eight) hours as needed.    Marland Kitchen levothyroxine (SYNTHROID) 50 MCG tablet Take 1 tablet (50 mcg total) by mouth at bedtime. 90 tablet 1  . Multiple Vitamins-Minerals (VITAMIN D3 COMPLETE PO) Take by mouth.    . nitrofurantoin, macrocrystal-monohydrate, (MACROBID) 100 MG capsule Take 1 capsule (100 mg total) by mouth 2 (two) times daily. 14 capsule 0  . PROLIA 60 MG/ML SOSY injection Inject into the skin.    Marland Kitchen PSYLLIUM HUSK PO Take by mouth.    . Red Yeast Rice Extract (RED YEAST RICE PO) Take 1 capsule by mouth daily.     Orlie Dakin Sodium (STOOL SOFTENER/LAXATIVE PO) Take by mouth.     No  current facility-administered medications on file prior to visit.    Review of Systems    She denies chest pain, palpitations, dyspnea, pnd, orthopnea, n, v, presyncope, syncope, edema, weight gain, or early satiety. All other systems reviewed and are otherwise negative except as noted above.  Physical Exam    VS:  BP 110/70 (BP Location: Left Arm, Patient Position: Sitting, Cuff Size: Normal)   Pulse 72   Ht 4\' 8"  (1.422 m)   Wt 174 lb (78.9 kg)   SpO2 98%   BMI 39.01 kg/m  , BMI Body mass index is 39.01 kg/m. GEN: Well nourished, well developed, in no acute distress. HEENT: normal. Neck: Supple, no JVD, carotid bruits, or masses. Cardiac: RRR, no murmurs, rubs, or gallops. No clubbing, cyanosis, edema.  Radials/DP/PT  2+ and equal bilaterally.  Respiratory:  Respirations regular and unlabored, clear to auscultation bilaterally. GI: Soft, nontender, nondistended, BS + x 4. MS: no deformity or atrophy. Skin: warm and dry, no rash. Neuro:  Strength and sensation are intact. Psych: Normal affect.  Accessory Clinical Findings    ECG personally reviewed by me today - NSR, 72bpm, no acute changes - no acute changes.  VITALS Reviewed today   Temp Readings from Last 3 Encounters:  06/16/20 98.5 F (36.9 C) (Oral)  04/11/20 98.3 F (36.8 C) (Oral)  11/12/19 98.5 F (36.9 C)   BP Readings from Last 3 Encounters:  09/22/20 110/70  06/30/20 109/68  06/16/20 123/78   Pulse Readings from Last 3 Encounters:  09/22/20 72  06/30/20 83  06/16/20 79    Wt Readings from Last 3 Encounters:  09/22/20 174 lb (78.9 kg)  06/30/20 173 lb (78.5 kg)  06/16/20 173 lb 9.6 oz (78.7 kg)     LABS  reviewed today   Lab Results  Component Value Date   WBC 7.5 06/16/2020   HGB 12.1 06/16/2020   HCT 36.0 06/16/2020   MCV 101 (H) 06/16/2020   PLT 320 06/16/2020   Lab Results  Component Value Date   CREATININE 0.80 06/16/2020   BUN 11 06/16/2020   NA 132 (L) 06/16/2020   K 4.5  06/16/2020   CL 102 06/16/2020   CO2 20 06/16/2020   Lab Results  Component Value Date   ALT 18 06/16/2020   AST 16 06/16/2020   ALKPHOS 58 06/16/2020   BILITOT 0.2 06/16/2020   Lab Results  Component Value Date   CHOL 198 06/16/2020   HDL 46 06/16/2020   LDLCALC 114 (H) 06/16/2020   TRIG 218 (H) 06/16/2020   CHOLHDL 4.0 01/19/2018    Lab Results  Component Value Date   HGBA1C 6.1 (H) 01/18/2018   Lab Results  Component Value Date   TSH 4.030 06/16/2020     STUDIES/PROCEDURES reviewed today   TEE 02/09/2018 - Left ventricle: Systolic function was normal. The estimated  ejection fraction was in the range of 55% to 60%. Wall motion was  normal; there were no regional wall motion abnormalities.  - Aortic arch: The aortic arch was normal in size. moderate aortic  atherosclerosis noted.  - Mitral valve: There was mild regurgitation.  - Left atrium: No evidence of thrombus in the atrial cavity or  appendage.  - Right atrium: No evidence of thrombus in the atrial cavity or  appendage.  Impressions:  - Cleared to have reveal device placed.   Echo 01/18/2018 - Left ventricle: Wall thickness was increased in a pattern of  moderate LVH. Systolic function was normal. The estimated  ejection fraction was in the range of 60% to 65%. Doppler  parameters are consistent with abnormal left ventricular  relaxation (grade 1 diastolic dysfunction).  - Right ventricle: The cavity size was mildly dilated.  Bilateral carotids 01/18/2018 IMPRESSION: Minimal to moderate amount of bilateral atherosclerotic plaque, right greater than left, not resulting in a hemodynamically significant stenosis within either internal carotid artery.  Assessment & Plan    Pre-MRI evaluation --Please refer to HPI above.  Her Linq device is MRI compatible. She may proceed with her MRI. We will send this note to [facility].   Cerebrovascular accident due to thrombosis of  right middle cerebral artery --She continues on Plavix per neurology.  We again discussed starting a statin with patient politely declining this medication.  She has discontinued Zetia since her last visit.  We also discussed PCSK9 inhibitors with patient preference to defer.  As previously noted above, her latest Linq downloads do not show any atrial fibrillation.  Reassess initiation of PCSK9 inhibitors and repeat imaging of carotids as below at RTC.  Defer for now.  Bilateral carotid artery stenosis --Mild to moderate disease, as well as vertebral artery disease noted on most recent imaging of 2019.  She continues on Plavix.  She continues to politely declined statin and wishes to defer PCSK9 inhibitors after long discussion today.  As above, she discontinued Zetia since her last visit.  She does not wish to make any medication changes at this time.  We also discussed updating her carotids with patient preference to defer. She wishes to just proceed with MRI at this time.  Reassess initiation of PCSK9 inhibitors and repeat imaging of carotids at follow-up.  LDL, goal <70 --As above.  05/2020 LDL 114 and not at goal of below 70.  We discussed this at length today.  Total cholesterol 198 and triglycerides elevated to 18.  We discussed PCSK9 inhibitors.  She prefers to defer initiation of PCSK9 inhibitors.  She declined statin and has discontinued her Zetia. Reassess at RTC.  BMI 39.0-39.9 --Lifestyle changes, including diet and exercise.  Continue to monitor.  Disposition: RTC 1 year. Pt will let us know if she wishes to update her carotid study before that time.   Arvil Chaco, PA-C 09/22/2020

## 2020-09-22 NOTE — Patient Instructions (Signed)
Medication Instructions:  -Your physician recommends that you continue on your current medications as directed. Please refer to the Current Medication list given to you today.  *If you need a refill on your cardiac medications before your next appointment, please call your pharmacy*   Lab Work: - none ordered  If you have labs (blood work) drawn today and your tests are completely normal, you will receive your results only by: MyChart Message (if you have MyChart) OR A paper copy in the mail If you have any lab test that is abnormal or we need to change your treatment, we will call you to review the results.   Testing/Procedures: - none ordered   Follow-Up: At [facility], you and your health needs are our priority.  As part of our continuing mission to provide you with exceptional heart care, we have created designated Provider Care Teams.  These Care Teams include your primary Cardiologist (physician) and Advanced Practice Providers (APPs -  Physician Assistants and Nurse Practitioners) who all work together to provide you with the care you need, when you need it.  We recommend signing up for the patient portal called "MyChart".  Sign up information is provided on this After Visit Summary.  MyChart is used to connect with patients for Virtual Visits (Telemedicine).  Patients are able to view lab/test results, encounter notes, upcoming appointments, etc.  Non-urgent messages can be sent to your provider as well.   To learn more about what you can do with MyChart, go to [email or web address].    Your next appointment:   1 year(s)  The format for your next appointment:   In Person  Provider:   You may see Timothy Gollan, MD or one of the following Advanced Practice Providers on your designated Care Team:   Christopher Berge, NP Ryan Dunn, PA-C Jacquelyn Visser, PA-C Cadence Furth, PA-C   Other Instructions N/a  

## 2020-09-22 NOTE — Telephone Encounter (Signed)
Spoke with Dr. George Ina with [facility] and reviewed that Dr. Rockey Situ said she could have MRI. He was appreciative for the call back with no further questions at this time.

## 2020-09-25 ENCOUNTER — Ambulatory Visit
Admission: RE | Admit: 2020-09-25 | Discharge: 2020-09-25 | Disposition: A | Discharge status: Home / Self Care | Payer: Medicare Other | Source: Ambulatory Visit | Attending: Ophthalmology | Admitting: Ophthalmology

## 2020-09-25 ENCOUNTER — Other Ambulatory Visit: Payer: Self-pay

## 2020-09-25 DIAGNOSIS — H4922 Sixth [abducent] nerve palsy, left eye: Secondary | ICD-10-CM

## 2020-09-25 MED ORDER — GADOBUTROL 1 MMOL/ML IV SOLN
7.5000 mL | Freq: Once | INTRAVENOUS | Status: AC | PRN
  Administered 2020-09-25: 7.5 mL via INTRAVENOUS

## 2020-10-02 ENCOUNTER — Other Ambulatory Visit
Admission: RE | Admit: 2020-10-02 | Discharge: 2020-10-02 | Disposition: A | Discharge status: Home / Self Care | Payer: Medicare Other | Attending: Ophthalmology | Admitting: Ophthalmology

## 2020-10-02 DIAGNOSIS — H4922 Sixth [abducent] nerve palsy, left eye: Secondary | ICD-10-CM | POA: Insufficient documentation

## 2020-10-02 LAB — CBC WITH DIFFERENTIAL/PLATELET
Abs Immature Granulocytes: 0.01 10*3/uL (ref 0.00–0.07)
Basophils Absolute: 0.1 10*3/uL (ref 0.0–0.1)
Basophils Relative: 2 %
Eosinophils Absolute: 0.3 10*3/uL (ref 0.0–0.5)
Eosinophils Relative: 5 %
HCT: 37.5 % (ref 36.0–46.0)
Hemoglobin: 12.3 g/dL (ref 12.0–15.0)
Immature Granulocytes: 0 %
Lymphocytes Relative: 37 %
Lymphs Abs: 2.5 10*3/uL (ref 0.7–4.0)
MCH: 32.5 pg (ref 26.0–34.0)
MCHC: 32.8 g/dL (ref 30.0–36.0)
MCV: 99.2 fL (ref 80.0–100.0)
Monocytes Absolute: 0.7 10*3/uL (ref 0.1–1.0)
Monocytes Relative: 10 %
Neutro Abs: 3.1 10*3/uL (ref 1.7–7.7)
Neutrophils Relative %: 46 %
Platelets: 247 10*3/uL (ref 150–400)
RBC: 3.78 MIL/uL — ABNORMAL LOW (ref 3.87–5.11)
RDW: 12.3 % (ref 11.5–15.5)
WBC: 6.7 10*3/uL (ref 4.0–10.5)
nRBC: 0 % (ref 0.0–0.2)

## 2020-10-02 LAB — SEDIMENTATION RATE: Sed Rate: 13 mm/hr (ref 0–30)

## 2020-10-22 LAB — MISC LABCORP TEST (SEND OUT): Labcorp test code: 165600

## 2020-11-03 ENCOUNTER — Encounter: Payer: Self-pay | Admitting: Urology

## 2020-11-03 ENCOUNTER — Telehealth: Payer: Self-pay

## 2020-11-03 ENCOUNTER — Other Ambulatory Visit: Payer: Self-pay

## 2020-11-03 ENCOUNTER — Ambulatory Visit (INDEPENDENT_AMBULATORY_CARE_PROVIDER_SITE_OTHER): Payer: Medicare Other | Admitting: Urology

## 2020-11-03 VITALS — BP 132/79 | HR 70 | Ht <= 58 in | Wt 178.0 lb

## 2020-11-03 DIAGNOSIS — N3020 Other chronic cystitis without hematuria: Secondary | ICD-10-CM | POA: Diagnosis not present

## 2020-11-03 MED ORDER — NITROFURANTOIN MONOHYD MACRO 100 MG PO CAPS
100.0000 mg | ORAL_CAPSULE | Freq: Every day | ORAL | 3 refills | Status: DC
Start: 2020-11-03 — End: 2021-06-23

## 2020-11-03 NOTE — Progress Notes (Signed)
11/03/2020 9:15 AM   Sarah Freeman May 16, 1931 [id number]  Referring provider: Valerie Roys, DO [place],   [id number]  No chief complaint on file.   HPI: Iwas consulted to assess the patient's worsening urinary incontinence over many months.She leaks with coughing sneezing bending and lifting. She has urge incontinence and no bedwetting. She wears 1 pad a day. She will wear 2 pads if active. She soaks more urine if she has urgency.  She voids 4 or 5 times at night. She voids every 2 hours during the day.   Myrbetriq did not help.  Patient had a positive urine culture in January and February 2019. The patient was started on trimethoprim and was also given Vesicare.  When I saw her last time she was infection free and did not want to go back on Vesicare for persisting urge incontinence. Overall it was improved. He has been having cardiac issues  The history was a little bit difficult bladder and the patient is on daily trimethoprim but twice in the last year has had bladder infections treated with dysuria and achiness.  It looks like she is currently on Macrodantin for infection.  Frequency is stable.  Urge incontinence stable  I could not find any cultures in the medical record in the last many month  Patient may or may not be having breakthrough infections but her symptoms do sound like cystitis that generally respond to an antibiotic.  I thought it was reasonable to change her to daily Macrodantin and have her come back in 4 months and check on her.  I would call her if the urine culture today is positive  Today Frequency stable.  Last urine culture negative Infection free on Macrodantin.  Incontinence mild and stable   PMH: Past Medical History:  Diagnosis Date  . Hyperlipidemia   . Hypothyroid   . Stroke ([place])   . Urinary incontinence     Surgical History: Past Surgical History:  Procedure Laterality Date  . ABDOMINAL HYSTERECTOMY      age 27  . BREAST BIOPSY Left 2000?   neg. dr. Dwyane Luo office  . CHOLECYSTECTOMY  1964  . COLONOSCOPY     Dr Nicolasa Ducking  . DILATION AND CURETTAGE OF UTERUS    . EYE SURGERY    . LOOP RECORDER INSERTION N/A 02/09/2018   Procedure: LOOP RECORDER INSERTION;  Surgeon: Deboraha Sprang, MD;  Location: [facility];  Service: Cardiovascular;  Laterality: N/A;  . SKIN CANCER EXCISION     face  . TEE WITHOUT CARDIOVERSION N/A 02/09/2018   Procedure: TRANSESOPHAGEAL ECHOCARDIOGRAM (TEE);  Surgeon: Minna Merritts, MD;  Location: [facility];  Service: Cardiovascular;  Laterality: N/A;    Home Medications:  Allergies as of 11/03/2020      Reactions   Statins Other (See Comments)   Leg cramps Leg cramps   Bacitracin    Neomycin    Neosporin + Pain Relief Max St  [neomy-bacit-polymyx-pramoxine]    Polymyxin B    Neomycin-polymyxin-gramicidin Other (See Comments)   Pt states wounds do not heal with neosporin Pt states wounds do not heal with neosporin      Medication List       Accurate as of November 03, 2020  9:15 AM. If you have any questions, ask your nurse or doctor.        cephALEXin 250 MG capsule Commonly known as: Keflex Take 1 capsule (250 mg total) by mouth daily.  clopidogrel 75 MG tablet Commonly known as: PLAVIX TAKE 1 TABLET(75 MG) BY MOUTH DAILY   diphenhydramine-acetaminophen 25-500 MG Tabs tablet Commonly known as: TYLENOL PM Take 1 tablet by mouth at bedtime as needed (for sleep).   esomeprazole 40 MG capsule Commonly known as: NEXIUM Take 1 capsule (40 mg total) by mouth daily.   gabapentin 100 MG capsule Commonly known as: NEURONTIN 1 pill qHS for 1 week, then 1 pill in the AM and 1 pill qHS for 1 week, then 1 pill 3x a day until you see me   HYDROcodone-acetaminophen 5-325 MG tablet Commonly known as: NORCO/VICODIN Take 1 tablet by mouth every 8 (eight) hours as needed.   levothyroxine 50 MCG tablet Commonly known as: SYNTHROID Take 1 tablet  (50 mcg total) by mouth at bedtime.   nitrofurantoin (macrocrystal-monohydrate) 100 MG capsule Commonly known as: Macrobid Take 1 capsule (100 mg total) by mouth 2 (two) times daily.   Prolia 60 MG/ML Sosy injection Generic drug: denosumab Inject into the skin.   PSYLLIUM HUSK PO Take by mouth.   RED YEAST RICE PO Take 1 capsule by mouth daily.   STOOL SOFTENER/LAXATIVE PO Take by mouth.   Tylenol Arthritis Pain 650 MG CR tablet Generic drug: acetaminophen Take 650 mg by mouth every 8 (eight) hours as needed for pain.   VITAMIN D3 COMPLETE PO Take by mouth.       Allergies:  Allergies  Allergen Reactions  . Statins Other (See Comments)    Leg cramps Leg cramps  . Bacitracin   . Neomycin   . Neosporin + Pain Relief Max St  [Neomy-Bacit-Polymyx-Pramoxine]   . Polymyxin B   . Neomycin-Polymyxin-Gramicidin Other (See Comments)    Pt states wounds do not heal with neosporin Pt states wounds do not heal with neosporin    Family History: Family History  Problem Relation Age of Onset  . Cancer Father        prostate  . Stroke Father   . Hypertension Sister   . Hyperlipidemia Daughter   . Heart disease Son   . Diabetes Maternal Grandmother   . Heart disease Maternal Grandmother   . Heart disease Maternal Grandfather   . Heart disease Daughter   . CAD Mother   . Failure to thrive Mother   . Breast cancer Neg Hx     Social History:  reports that she has never smoked. She has never used smokeless tobacco. She reports that she does not drink alcohol and does not use drugs.  ROS:                                        Physical Exam: There were no vitals taken for this visit.  Constitutional:  Alert and oriented, No acute distress.   Laboratory Data: Lab Results  Component Value Date   WBC 6.7 10/02/2020   HGB 12.3 10/02/2020   HCT 37.5 10/02/2020   MCV 99.2 10/02/2020   PLT 247 10/02/2020    Lab Results  Component Value Date    CREATININE 0.80 06/16/2020    No results found for: PSA  No results found for: TESTOSTERONE  Lab Results  Component Value Date   HGBA1C 6.1 (H) 01/18/2018    Urinalysis    Component Value Date/Time   COLORURINE YELLOW (A) 01/18/2018 1925   APPEARANCEUR Cloudy (A) 06/30/2020 0931   LABSPEC 1.006 01/18/2018 1925  PHURINE 7.0 01/18/2018 1925   GLUCOSEU Negative 06/30/2020 0931   HGBUR NEGATIVE 01/18/2018 1925   BILIRUBINUR Negative 06/30/2020 [place] 01/18/2018 1925   PROTEINUR Negative 06/30/2020 0931   PROTEINUR NEGATIVE 01/18/2018 1925   NITRITE Negative 06/30/2020 0931   NITRITE NEGATIVE 01/18/2018 1925   LEUKOCYTESUR Trace (A) 06/30/2020 0931    Pertinent Imaging:   Assessment & Plan: 90x3 sent to pharmacy and see in 1 year  There are no diagnoses linked to this encounter.  No follow-ups on file.  Reece Packer, MD  [facility] [address] [place], [place] [address] [phone]

## 2020-11-03 NOTE — Telephone Encounter (Signed)
The patient wants to turn in her home remote monitor. I ordered her a return kit. I told her I will let Dr. Caryl Comes know she do not want to be monitored anymore. The pt states she has not been monitored in years and the doctor told her she do not need to be monitored. I told her I will let Dr. Caryl Comes know and the return kit will come in 7-10 business days.

## 2020-11-04 NOTE — Telephone Encounter (Addendum)
Dr Caryl Comes made aware of pt's desire to return monitor and is agreeable.

## 2020-11-05 ENCOUNTER — Telehealth: Payer: Self-pay

## 2020-11-05 NOTE — Telephone Encounter (Signed)
Patient's daughter Almyra Free called wanting to clarify suppression medication instruction. Per Dr. Mikle Bosworth office note patient is to continue taking Macrobid 100mg  daily for suppression. Patient also has a bottle at home for Keflex 250mg  1 daily. Reference telephone encounter from 07/01/20 which states patient cannot afford macrobid so an alternative keflex was sent. This was explained to the patient's daughter. She states patient does get confused sometimes and apparently was able to fill the macrobid. She will discontinue the Keflex and only continue the Macrobid

## 2020-11-06 NOTE — Telephone Encounter (Signed)
Patient son calling  Wants to clarify that she is to turn in monitor today  States he is waiting in [place] office now  Please call if needed

## 2020-11-11 ENCOUNTER — Other Ambulatory Visit: Payer: Self-pay | Admitting: Family Medicine

## 2020-11-11 NOTE — Telephone Encounter (Signed)
Requested medication (s) are due for refill today: yes  Requested medication (s) are on the active medication list: yes  Last refill:  06/16/20  #90 3 refills  Future visit scheduled: yes  Notes to clinic:  Sig needs changed to dosing now.     Requested Prescriptions  Pending Prescriptions Disp Refills   gabapentin (NEURONTIN) 100 MG capsule [Pharmacy Med Name: GABAPENTIN 100MG  CAPSULES] 90 capsule 3    Sig: TAKE 1 CAPSULE BY MOUTH EVERY NIGHT AT BEDTIME FOR 1 WEEK; THE 1 EVERY MORNING AND 1 EVERY NIGHT AT BEDTIME FOR 1 WEEK; THEN 1 THREE TIMES DAILY      Neurology: Anticonvulsants - gabapentin Passed - 11/11/2020  4:24 PM      Passed - Valid encounter within last 12 months    Recent Outpatient Visits           4 months ago Essential hypertension   [place], Megan P, DO   7 months ago Pre-op examination   [place], Megan P, DO   1 year ago Routine general medical examination at a health care facility   [place], Raymond, DO   1 year ago Essential hypertension   [place], Megan P, DO   1 year ago Upper respiratory tract infection, unspecified type   [facility], Lilia Argue, [place]       Future Appointments             In 6 days  [facility], [place]   In 68 months [place], Nicki Reaper, [place]

## 2020-11-17 ENCOUNTER — Other Ambulatory Visit: Payer: Self-pay | Admitting: Nurse Practitioner

## 2020-11-17 ENCOUNTER — Ambulatory Visit: Payer: Medicare Other

## 2020-11-17 ENCOUNTER — Telehealth: Payer: Self-pay

## 2020-11-17 ENCOUNTER — Other Ambulatory Visit: Payer: Self-pay | Admitting: Family Medicine

## 2020-11-17 MED ORDER — GABAPENTIN 100 MG PO CAPS
100.0000 mg | ORAL_CAPSULE | Freq: Three times a day (TID) | ORAL | 4 refills | Status: DC
Start: 2020-11-17 — End: 2021-12-08

## 2020-11-17 NOTE — Telephone Encounter (Signed)
Gabapentin directions 1 at bedtime X 1 week 1 in the morning and 1 at bedtime X 1 week   Stays on 1 in the morning, mid day  and bedtime

## 2020-11-17 NOTE — Telephone Encounter (Signed)
Medication: gabapentin (NEURONTIN) 100 MG capsule [[id number]] - Patient states that the pharamacy did receive this medication. Can this please be resent/  Has the patient contacted their pharmacy? YES  (Agent: If no, request that the patient contact the pharmacy for the refill.) (Agent: If yes, when and what did the pharmacy advise?)  Preferred Pharmacy (with phone number or street name): [facility] [place], [address] [place] [address] Phone: [phone] Fax: [phone] Hours: Not open 24 hours    Agent: Please be advised that RX refills may take up to 3 business days. We ask that you follow-up with your pharmacy.

## 2020-11-17 NOTE — Telephone Encounter (Signed)
Copied from [place] [phone]. Topic: General - Other >> Nov 17, 2020  8:22 AM Leward Quan A wrote: Reason for CRM: Marya Amsler with [facility] called in for new Rx or clarification on direction for gabapentin (NEURONTIN) 100 MG capsule received last week please

## 2020-11-17 NOTE — Telephone Encounter (Signed)
Update script and resent:)

## 2020-11-27 ENCOUNTER — Other Ambulatory Visit: Payer: Self-pay | Admitting: Family Medicine

## 2020-12-12 ENCOUNTER — Other Ambulatory Visit: Payer: Self-pay | Admitting: Family Medicine

## 2020-12-12 MED ORDER — CLOPIDOGREL BISULFATE 75 MG PO TABS
ORAL_TABLET | ORAL | 0 refills | Status: DC
Start: 2020-12-12 — End: 2021-03-14

## 2020-12-12 NOTE — Telephone Encounter (Signed)
Medication Refill - Medication: clopidogrel (PLAVIX) 75 MG tablet  Pt was only given 30 day from the pharmacy when she pick up Rx in December / Please advise   Has the patient contacted their pharmacy? Yes.   (Agent: If no, request that the patient contact the pharmacy for the refill.) (Agent: If yes, when and what did the pharmacy advise?)  Preferred Pharmacy (with phone number or street name): no refills /call pcp  Agent: Please be advised that RX refills may take up to 3 business days. We ask that you follow-up with your pharmacy.

## 2020-12-13 ENCOUNTER — Other Ambulatory Visit: Payer: Self-pay | Admitting: Family Medicine

## 2020-12-30 ENCOUNTER — Ambulatory Visit: Payer: Medicare Other | Admitting: Family Medicine

## 2021-01-16 ENCOUNTER — Ambulatory Visit (INDEPENDENT_AMBULATORY_CARE_PROVIDER_SITE_OTHER): Payer: Medicare Other

## 2021-01-16 VITALS — Ht <= 58 in | Wt 174.0 lb

## 2021-01-16 DIAGNOSIS — Z0000 Encounter for general adult medical examination without abnormal findings: Secondary | ICD-10-CM | POA: Diagnosis not present

## 2021-01-16 NOTE — Patient Instructions (Signed)
[name] , Thank you for taking time to come for your Medicare Wellness Visit. I appreciate your ongoing commitment to your health goals. Please review the following plan we discussed and let me know if I can assist you in the future.   Screening recommendations/referrals: Colonoscopy: not required Mammogram: not required Bone Density: completed 01/09/2008 Recommended yearly ophthalmology/optometry visit for glaucoma screening and checkup Recommended yearly dental visit for hygiene and checkup  Vaccinations: Influenza vaccine: up to date per patient Pneumococcal vaccine: completed 10/05/2017 Tdap vaccine: completed 05/19/2017, due 05/20/2027 Shingles vaccine: completed    Covid-19:08/29/2020, 12/29/2019, 12/06/2019  Advanced directives: Please bring a copy of your POA (Power of Attorney) and/or Living Will to your next appointment.   Conditions/risks identified: none  Next appointment: Follow up in one year for your annual wellness visit    Preventive Care 65 Years and Older, Female Preventive care refers to lifestyle choices and visits with your health care provider that can promote health and wellness. What does preventive care include?  A yearly physical exam. This is also called an annual well check.  Dental exams once or twice a year.  Routine eye exams. Ask your health care provider how often you should have your eyes checked.  Personal lifestyle choices, including:  Daily care of your teeth and gums.  Regular physical activity.  Eating a healthy diet.  Avoiding tobacco and drug use.  Limiting alcohol use.  Practicing safe sex.  Taking low-dose aspirin every day.  Taking vitamin and mineral supplements as recommended by your health care provider. What happens during an annual well check? The services and screenings done by your health care provider during your annual well check will depend on your age, overall health, lifestyle risk factors, and family history of  disease. Counseling  Your health care provider may ask you questions about your:  Alcohol use.  Tobacco use.  Drug use.  Emotional well-being.  Home and relationship well-being.  Sexual activity.  Eating habits.  History of falls.  Memory and ability to understand (cognition).  Work and work Statistician.  Reproductive health. Screening  You may have the following tests or measurements:  Height, weight, and BMI.  Blood pressure.  Lipid and cholesterol levels. These may be checked every 5 years, or more frequently if you are over 18 years old.  Skin check.  Lung cancer screening. You may have this screening every year starting at age 1 if you have a 30-pack-year history of smoking and currently smoke or have quit within the past 15 years.  Fecal occult blood test (FOBT) of the stool. You may have this test every year starting at age 23.  Flexible sigmoidoscopy or colonoscopy. You may have a sigmoidoscopy every 5 years or a colonoscopy every 10 years starting at age 33.  Hepatitis C blood test.  Hepatitis B blood test.  Sexually transmitted disease (STD) testing.  Diabetes screening. This is done by checking your blood sugar (glucose) after you have not eaten for a while (fasting). You may have this done every 1-3 years.  Bone density scan. This is done to screen for osteoporosis. You may have this done starting at age 77.  Mammogram. This may be done every 1-2 years. Talk to your health care provider about how often you should have regular mammograms. Talk with your health care provider about your test results, treatment options, and if necessary, the need for more tests. Vaccines  Your health care provider may recommend certain vaccines, such as:  Influenza vaccine. This is recommended every year.  Tetanus, diphtheria, and acellular pertussis (Tdap, Td) vaccine. You may need a Td booster every 10 years.  Zoster vaccine. You may need this after age  7.  Pneumococcal 13-valent conjugate (PCV13) vaccine. One dose is recommended after age 54.  Pneumococcal polysaccharide (PPSV23) vaccine. One dose is recommended after age 41. Talk to your health care provider about which screenings and vaccines you need and how often you need them. This information is not intended to replace advice given to you by your health care provider. Make sure you discuss any questions you have with your health care provider. Document Released: 12/12/2015 Document Revised: 08/04/2016 Document Reviewed: 09/16/2015 Elsevier Interactive Patient Education  2017 [place] Prevention in the Home Falls can cause injuries. They can happen to people of all ages. There are many things you can do to make your home safe and to help prevent falls. What can I do on the outside of my home?  Regularly fix the edges of walkways and driveways and fix any cracks.  Remove anything that might make you trip as you walk through a door, such as a raised step or threshold.  Trim any bushes or trees on the path to your home.  Use bright outdoor lighting.  Clear any walking paths of anything that might make someone trip, such as rocks or tools.  Regularly check to see if handrails are loose or broken. Make sure that both sides of any steps have handrails.  Any raised decks and porches should have guardrails on the edges.  Have any leaves, snow, or ice cleared regularly.  Use sand or salt on walking paths during winter.  Clean up any spills in your garage right away. This includes oil or grease spills. What can I do in the bathroom?  Use night lights.  Install grab bars by the toilet and in the tub and shower. Do not use towel bars as grab bars.  Use non-skid mats or decals in the tub or shower.  If you need to sit down in the shower, use a plastic, non-slip stool.  Keep the floor dry. Clean up any water that spills on the floor as soon as it happens.  Remove  soap buildup in the tub or shower regularly.  Attach bath mats securely with double-sided non-slip rug tape.  Do not have throw rugs and other things on the floor that can make you trip. What can I do in the bedroom?  Use night lights.  Make sure that you have a light by your bed that is easy to reach.  Do not use any sheets or blankets that are too big for your bed. They should not hang down onto the floor.  Have a firm chair that has side arms. You can use this for support while you get dressed.  Do not have throw rugs and other things on the floor that can make you trip. What can I do in the kitchen?  Clean up any spills right away.  Avoid walking on wet floors.  Keep items that you use a lot in easy-to-reach places.  If you need to reach something above you, use a strong step stool that has a grab bar.  Keep electrical cords out of the way.  Do not use floor polish or wax that makes floors slippery. If you must use wax, use non-skid floor wax.  Do not have throw rugs and other things on the floor that  can make you trip. What can I do with my stairs?  Do not leave any items on the stairs.  Make sure that there are handrails on both sides of the stairs and use them. Fix handrails that are broken or loose. Make sure that handrails are as long as the stairways.  Check any carpeting to make sure that it is firmly attached to the stairs. Fix any carpet that is loose or worn.  Avoid having throw rugs at the top or bottom of the stairs. If you do have throw rugs, attach them to the floor with carpet tape.  Make sure that you have a light switch at the top of the stairs and the bottom of the stairs. If you do not have them, ask someone to add them for you. What else can I do to help prevent falls?  Wear shoes that:  Do not have high heels.  Have rubber bottoms.  Are comfortable and fit you well.  Are closed at the toe. Do not wear sandals.  If you use a  stepladder:  Make sure that it is fully opened. Do not climb a closed stepladder.  Make sure that both sides of the stepladder are locked into place.  Ask someone to hold it for you, if possible.  Clearly mark and make sure that you can see:  Any grab bars or handrails.  First and last steps.  Where the edge of each step is.  Use tools that help you move around (mobility aids) if they are needed. These include:  Canes.  Walkers.  Scooters.  Crutches.  Turn on the lights when you go into a dark area. Replace any light bulbs as soon as they burn out.  Set up your furniture so you have a clear path. Avoid moving your furniture around.  If any of your floors are uneven, fix them.  If there are any pets around you, be aware of where they are.  Review your medicines with your doctor. Some medicines can make you feel dizzy. This can increase your chance of falling. Ask your doctor what other things that you can do to help prevent falls. This information is not intended to replace advice given to you by your health care provider. Make sure you discuss any questions you have with your health care provider. Document Released: 09/11/2009 Document Revised: 04/22/2016 Document Reviewed: 12/20/2014 Elsevier Interactive Patient Education  2017 [employer].

## 2021-01-16 NOTE — Progress Notes (Signed)
I connected with Sarah Freeman today by telephone and verified that I am speaking with the correct person using two identifiers. Location patient: home Location provider: work Persons participating in the virtual visit: Arlette Cloma, [place] LPN.   I discussed the limitations, risks, security and privacy concerns of performing an evaluation and management service by telephone and the availability of in person appointments. I also discussed with the patient that there may be a patient responsible charge related to this service. The patient expressed understanding and verbally consented to this telephonic visit.    Interactive audio and video telecommunications were attempted between this provider and patient, however failed, due to patient having technical difficulties OR patient did not have access to video capability.  We continued and completed visit with audio only.     Vital signs may be patient reported or missing.  Subjective:   Sarah Freeman is a 87 y.o. female who presents for Medicare Annual (Subsequent) preventive examination.  Review of Systems     Cardiac Risk Factors include: advanced age (>69men, >69 women);hypertension;obesity (BMI >30kg/m2)     Objective:    Today's Vitals   01/16/21 0811  Weight: 174 lb (78.9 kg)  Height: 4\' 8"  (1.422 m)   Body mass index is 39.01 kg/m.  Advanced Directives 01/16/2021 11/12/2019 07/12/2018 03/06/2018 02/09/2018 01/18/2018 01/18/2018  Does Patient Have a Medical Advance Directive? Yes No Yes Yes No No No  Type of Paramedic of [place];Living will - [place];Living will Living will - - -  Does patient want to make changes to medical advance directive? - Yes (MAU/Ambulatory/Procedural Areas - Information given) - - - - -  Copy of [place] in Chart? No - copy requested - No - copy requested - - - -  Would patient like information on creating a medical advance  directive? - - - - No - Patient declined - No - Patient declined    Current Medications (verified) Outpatient Encounter Medications as of 01/16/2021  Medication Sig  . acetaminophen (TYLENOL) 650 MG CR tablet Take 650 mg by mouth every 8 (eight) hours as needed for pain.  Marland Kitchen clopidogrel (PLAVIX) 75 MG tablet TAKE 1 TABLET(75 MG) BY MOUTH DAILY  . diphenhydramine-acetaminophen (TYLENOL PM) 25-500 MG TABS tablet Take 1 tablet by mouth at bedtime as needed (for sleep).   Marland Kitchen esomeprazole (NEXIUM) 40 MG capsule Take 1 capsule (40 mg total) by mouth daily.  Marland Kitchen gabapentin (NEURONTIN) 100 MG capsule Take 1 capsule (100 mg total) by mouth 3 (three) times daily.  Marland Kitchen HYDROcodone-acetaminophen (NORCO/VICODIN) 5-325 MG tablet Take 1 tablet by mouth every 8 (eight) hours as needed.  . Multiple Vitamins-Minerals (VITAMIN D3 COMPLETE PO) Take by mouth.  . PROLIA 60 MG/ML SOSY injection Inject into the skin.  Marland Kitchen PSYLLIUM HUSK PO Take by mouth.  . Red Yeast Rice Extract (RED YEAST RICE PO) Take 1 capsule by mouth daily.   Orlie Dakin Sodium (STOOL SOFTENER/LAXATIVE PO) Take by mouth.  . SYNTHROID 50 MCG tablet TAKE 1 TABLET(50 MCG) BY MOUTH AT BEDTIME  . nitrofurantoin, macrocrystal-monohydrate, (MACROBID) 100 MG capsule Take 1 capsule (100 mg total) by mouth daily. (Patient not taking: Reported on 01/16/2021)   No facility-administered encounter medications on file as of 01/16/2021.    Allergies (verified) Statins, Bacitracin, Neomycin, Neosporin + pain relief max st  [neomy-bacit-polymyx-pramoxine], Polymyxin b, and Neomycin-polymyxin-gramicidin   History: Past Medical History:  Diagnosis Date  . Hyperlipidemia   .  Hypothyroid   . Stroke ([place])   . Urinary incontinence    Past Surgical History:  Procedure Laterality Date  . ABDOMINAL HYSTERECTOMY     age 8  . BREAST BIOPSY Left 2000?   neg. dr. Dwyane Luo office  . CHOLECYSTECTOMY  1964  . COLONOSCOPY     Dr Nicolasa Ducking  . DILATION AND  CURETTAGE OF UTERUS    . EYE SURGERY    . LOOP RECORDER INSERTION N/A 02/09/2018   Procedure: LOOP RECORDER INSERTION;  Surgeon: Deboraha Sprang, MD;  Location: [facility];  Service: Cardiovascular;  Laterality: N/A;  . SKIN CANCER EXCISION     face  . TEE WITHOUT CARDIOVERSION N/A 02/09/2018   Procedure: TRANSESOPHAGEAL ECHOCARDIOGRAM (TEE);  Surgeon: Minna Merritts, MD;  Location: [facility];  Service: Cardiovascular;  Laterality: N/A;   Family History  Problem Relation Age of Onset  . Cancer Father        prostate  . Stroke Father   . Hypertension Sister   . Hyperlipidemia Daughter   . Heart disease Son   . Diabetes Maternal Grandmother   . Heart disease Maternal Grandmother   . Heart disease Maternal Grandfather   . Heart disease Daughter   . CAD Mother   . Failure to thrive Mother   . Breast cancer Neg Hx    Social History   Socioeconomic History  . Marital status: Widowed    Spouse name: Not on file  . Number of children: Not on file  . Years of education: Not on file  . Highest education level: Not on file  Occupational History  . Occupation: retired  Tobacco Use  . Smoking status: Never Smoker  . Smokeless tobacco: Never Used  Vaping Use  . Vaping Use: Never used  Substance and Sexual Activity  . Alcohol use: No    Alcohol/week: 0.0 standard drinks  . Drug use: No  . Sexual activity: Not Currently  Other Topics Concern  . Not on file  Social History Narrative  . Not on file   Social Determinants of Health   Financial Resource Strain: Low Risk   . Difficulty of Paying Living Expenses: Not hard at all  Food Insecurity: No Food Insecurity  . Worried About Charity fundraiser in the Last Year: Never true  . Ran Out of Food in the Last Year: Never true  Transportation Needs: No Transportation Needs  . Lack of Transportation (Medical): No  . Lack of Transportation (Non-Medical): No  Physical Activity: Insufficiently Active  . Days of Exercise  per Week: 3 days  . Minutes of Exercise per Session: 20 min  Stress: No Stress Concern Present  . Feeling of Stress : Not at all  Social Connections: Not on file    Tobacco Counseling Counseling given: Not Answered   Clinical Intake:  Pre-visit preparation completed: Yes  Pain : No/denies pain     Nutritional Status: BMI > 30  Obese Nutritional Risks: None Diabetes: No  How often do you need to have someone help you when you read instructions, pamphlets, or other written materials from your doctor or pharmacy?: 1 - Never What is the last grade level you completed in school?: 12th grade  Diabetic? no  Interpreter Needed?: No  Information entered by :: NAllen LPN   Activities of Daily Living In your present state of health, do you have any difficulty performing the following activities: 01/16/2021  Hearing? Y  Comment has hearing aides in both  ears  Vision? Y  Comment double vision at times  Difficulty concentrating or making decisions? Y  Comment some memory  Walking or climbing stairs? N  Dressing or bathing? N  Doing errands, shopping? N  Preparing Food and eating ? N  Using the Toilet? N  In the past six months, have you accidently leaked urine? Y  Comment wears pads  Do you have problems with loss of bowel control? N  Managing your Medications? N  Managing your Finances? N  Housekeeping or managing your Housekeeping? N  Some recent data might be hidden    Patient Care Team: Valerie Roys, DO as PCP - General (Family Medicine) Rockey Situ Kathlene November, MD as PCP - Cardiology (Cardiology) Bary Castilla Forest Gleason, MD (General Surgery) Valerie Roys, DO as Referring Physician (Family Medicine)  Indicate any recent Medical Services you may have received from other than [facility] providers in the past year (date may be approximate).     Assessment:   This is a routine wellness examination for Jearlean.  Hearing/Vision screen No exam data present  Dietary issues and  exercise activities discussed: Current Exercise Habits: Home exercise routine, Type of exercise: treadmill, Time (Minutes): 20, Frequency (Times/Week): 3, Weekly Exercise (Minutes/Week): 60  Goals    . DIET - INCREASE WATER INTAKE     Recommend drinking at least 6-8 glasses of water a day     . Patient Stated     01/16/2021, no goals      Depression Screen PHQ 2/9 Scores 01/16/2021 11/12/2019 11/12/2019 10/11/2018 03/06/2018 02/22/2017 03/30/2016  PHQ - 2 Score 0 0 0 0 0 0 0  PHQ- 9 Score - 0 - 0 - 1 -    Fall Risk Fall Risk  01/16/2021 11/12/2019 11/12/2019 10/11/2018 03/06/2018  Falls in the past year? 0 0 0 0 No  Number falls in past yr: - 0 0 0 -  Injury with Fall? - 0 0 0 -  Risk for fall due to : Medication side effect - - - -  Follow up Falls evaluation completed;Education provided;Falls prevention discussed - - - -    FALL RISK PREVENTION PERTAINING TO THE HOME:  Any stairs in or around the home? Yes  If so, are there any without handrails? No  Home free of loose throw rugs in walkways, pet beds, electrical cords, etc? Yes  Adequate lighting in your home to reduce risk of falls? Yes   ASSISTIVE DEVICES UTILIZED TO PREVENT FALLS:  Life alert? No  Use of a cane, walker or w/c? No  Grab bars in the bathroom? Yes  Shower chair or bench in shower? No  Elevated toilet seat or a handicapped toilet? No   TIMED UP AND GO:  Was the test performed? No .     Cognitive Function:     6CIT Screen 01/16/2021 11/12/2019 03/06/2018  What Year? 4 points 0 points 0 points  What month? 0 points 0 points 0 points  What time? 0 points 0 points 0 points  Count back from 20 0 points 0 points 0 points  Months in reverse 0 points 0 points 0 points  Repeat phrase 10 points 0 points 0 points  Total Score 14 0 0    Immunizations Immunization History  Administered Date(s) Administered  . Influenza, High Dose Seasonal PF 10/05/2017, 10/11/2018, 08/30/2019  . Influenza-Unspecified 10/06/2016   . PFIZER(Purple Top)SARS-COV-2 Vaccination 12/06/2019, 12/29/2019  . Pneumococcal Conjugate-13 10/06/2016  . Pneumococcal Polysaccharide-23 07/22/2004, 10/05/2017  . Td  01/20/2011  . Tdap 05/19/2017  . Zoster 01/09/2008  . Zoster Recombinat (Shingrix) 03/18/2017, 05/19/2017    TDAP status: Up to date  Flu Vaccine status: Up to date  Pneumococcal vaccine status: Up to date  Covid-19 vaccine status: Completed vaccines  Qualifies for Shingles Vaccine? Yes   Zostavax completed Yes   Shingrix Completed?: Yes  Screening Tests Health Maintenance  Topic Date Due  . COVID-19 Vaccine (3 - Booster for Pfizer series) 06/27/2020  . INFLUENZA VACCINE  06/29/2020  . TETANUS/TDAP  05/20/2027  . DEXA SCAN  Completed  . PNA vac Low Risk Adult  Completed    Health Maintenance  Health Maintenance Due  Topic Date Due  . COVID-19 Vaccine (3 - Booster for Pfizer series) 06/27/2020  . INFLUENZA VACCINE  06/29/2020    Colorectal cancer screening: No longer required.   Mammogram status: No longer required due to age.  Bone Density status: Completed 01/09/2008.  Lung Cancer Screening: (Low Dose CT Chest recommended if Age 73-80 years, 30 pack-year currently smoking OR have quit w/in 15years.) does not qualify.   Lung Cancer Screening Referral: no  Additional Screening:  Hepatitis C Screening: does not qualify;   Vision Screening: Recommended annual ophthalmology exams for early detection of glaucoma and other disorders of the eye. Is the patient up to date with their annual eye exam?  Yes  Who is the provider or what is the name of the office in which the patient attends annual eye exams? [facility] If pt is not established with a provider, would they like to be referred to a provider to establish care? No .   Dental Screening: Recommended annual dental exams for proper oral hygiene  Community Resource Referral / Chronic Care Management: CRR required this visit?  No   CCM  required this visit?  No      Plan:     I have personally reviewed and noted the following in the patient's chart:   . Medical and social history . Use of alcohol, tobacco or illicit drugs  . Current medications and supplements . Functional ability and status . Nutritional status . Physical activity . Advanced directives . List of other physicians . Hospitalizations, surgeries, and ER visits in previous 12 months . Vitals . Screenings to include cognitive, depression, and falls . Referrals and appointments  In addition, I have reviewed and discussed with patient certain preventive protocols, quality metrics, and best practice recommendations. A written personalized care plan for preventive services as well as general preventive health recommendations were provided to patient.     Kellie Simmering, LPN   [id number]   Nurse Notes:

## 2021-02-27 DIAGNOSIS — I700 Atherosclerosis of aorta: Secondary | ICD-10-CM | POA: Insufficient documentation

## 2021-03-14 ENCOUNTER — Other Ambulatory Visit: Payer: Self-pay | Admitting: Family Medicine

## 2021-03-14 NOTE — Telephone Encounter (Signed)
Requested Prescriptions  Pending Prescriptions Disp Refills  . clopidogrel (PLAVIX) 75 MG tablet [Pharmacy Med Name: CLOPIDOGREL 75MG  TABLETS] 90 tablet 0    Sig: TAKE 1 TABLET(75 MG) BY MOUTH DAILY     Hematology: Antiplatelets - clopidogrel Failed - 03/14/2021  9:16 AM      Failed - Evaluate AST, ALT within 2 months of therapy initiation.      Passed - ALT in normal range and within 360 days    ALT  Date Value Ref Range Status  06/16/2020 18 0 - 32 IU/L Final         Passed - AST in normal range and within 360 days    AST  Date Value Ref Range Status  06/16/2020 16 0 - 40 IU/L Final         Passed - HCT in normal range and within 180 days    HCT  Date Value Ref Range Status  10/02/2020 37.5 36.0 - 46.0 % Final   Hematocrit  Date Value Ref Range Status  06/16/2020 36.0 34.0 - 46.6 % Final         Passed - HGB in normal range and within 180 days    Hemoglobin  Date Value Ref Range Status  10/02/2020 12.3 12.0 - 15.0 g/dL Final  06/16/2020 12.1 11.1 - 15.9 g/dL Final         Passed - PLT in normal range and within 180 days    Platelets  Date Value Ref Range Status  10/02/2020 247 150 - 400 K/uL Final  06/16/2020 320 150 - 450 x10E3/uL Final         Passed - Valid encounter within last 6 months    Recent Outpatient Visits          9 months ago Essential hypertension   [place], Megan P, DO   11 months ago Pre-op examination   [place], Megan P, DO   1 year ago Routine general medical examination at a health care facility   [place], Leonidas, DO   1 year ago Essential hypertension   [place], Laurel, DO   2 years ago Upper respiratory tract infection, unspecified type   [facility], Lilia Argue, [place]      Future Appointments            In 8 months MacDiarmid, Nicki Reaper, MD [place]   In 10 months  [facility], [place]

## 2021-05-26 ENCOUNTER — Ambulatory Visit: Payer: Self-pay | Admitting: *Deleted

## 2021-05-26 NOTE — Telephone Encounter (Signed)
Patient's son is calling to report patient is having some increased constipation issues- she had not had BM in 3 days. She treated herself with Miralax dosing last night and her son got her Doculax today to try. Patient did have a small  movement- (hard small BM) but still feels pressure to go- but not moving. Patient has an enema at the house and he wants to know if using that would be helpful. Advised would ask PCP- but advised if no further BM- patient may need to have appointment.

## 2021-05-26 NOTE — Telephone Encounter (Signed)
Pts son called in stating pt is having constipation issues, and wanted to see about getting something, or what can be to help. Please advise.  Reason for Disposition . Last bowel movement (BM) > 4 days ago  Answer Assessment - Initial Assessment Questions 1. STOOL PATTERN OR FREQUENCY: "How often do you have a bowel movement (BM)?"  (Normal range: 3 times a day to every 3 days)  "When was your last BM?"       Today- one tiny BM 2. STRAINING: "Do you have to strain to have a BM?"      N/a 3. RECTAL PAIN: "Does your rectum hurt when the stool comes out?" If Yes, ask: "Do you have hemorrhoids? How bad is the pain?"  (Scale 1-10; or mild, moderate, severe)     Feels that she needs to go- but not producing BM- no BM 2-3 days 4. STOOL COMPOSITION: "Are the stools hard?"      yes 5. BLOOD ON STOOLS: "Has there been any blood on the toilet tissue or on the surface of the BM?" If Yes, ask: "When was the last time?"      no 6. CHRONIC CONSTIPATION: "Is this a new problem for you?"  If no, ask: "How long have you had this problem?" (days, weeks, months)      Yes- using stool softener 7. CHANGES IN DIET OR HYDRATION: "Have there been any recent changes in your diet?" "How much fluids are you drinking on a daily basis?"  "How much have you had to drink today?"     Patient is eating- not eating as much, Patient does not drink enough- but today she is drinking 8. MEDICATIONS: "Have you been taking any new medications?" "Are you taking any narcotic pain medications?" (e.g., Vicodin, Percocet, morphine, Dilaudid)     Not sure 9. LAXATIVES: "Have you been using any stool softeners, laxatives, or enemas?"  If yes, ask "What, how often, and when was the last time?"      Miralax yesterday, Ducalax today 10. ACTIVITY:  "How much walking do you do every day?"  "Has your activity level decreased in the past week?"        Yes- encouraged walking more 11. CAUSE: "What do you think is causing the constipation?"         Not sure 12. OTHER SYMPTOMS: "Do you have any other symptoms?" (e.g., abdominal pain, bloating, fever, vomiting)       no 13. MEDICAL HISTORY: "Do you have a history of hemorrhoids, rectal fissures, or rectal surgery or rectal abscess?"         N/a 14. PREGNANCY: "Is there any chance you are pregnant?" "When was your last menstrual period?"       N/a  Protocols used: Constipation-A-AH

## 2021-05-26 NOTE — Telephone Encounter (Signed)
Please advise 

## 2021-05-26 NOTE — Telephone Encounter (Signed)
I would recommend oral mag citrate before an enema

## 2021-05-26 NOTE — Telephone Encounter (Signed)
Spoke to patient son and informed him of Dr.Johnson's recommendations. Patient son verbalized understanding. Advised to give our office a call back if he has any questions or concerns.

## 2021-05-27 ENCOUNTER — Emergency Department
Admission: EM | Admit: 2021-05-27 | Discharge: 2021-05-27 | Disposition: A | Discharge status: Home / Self Care | Payer: Medicare Other | Attending: Emergency Medicine | Admitting: Emergency Medicine

## 2021-05-27 ENCOUNTER — Other Ambulatory Visit: Payer: Self-pay

## 2021-05-27 ENCOUNTER — Emergency Department: Payer: Medicare Other

## 2021-05-27 DIAGNOSIS — N183 Chronic kidney disease, stage 3 unspecified: Secondary | ICD-10-CM | POA: Insufficient documentation

## 2021-05-27 DIAGNOSIS — Z8673 Personal history of transient ischemic attack (TIA), and cerebral infarction without residual deficits: Secondary | ICD-10-CM | POA: Diagnosis not present

## 2021-05-27 DIAGNOSIS — Z79899 Other long term (current) drug therapy: Secondary | ICD-10-CM | POA: Insufficient documentation

## 2021-05-27 DIAGNOSIS — E039 Hypothyroidism, unspecified: Secondary | ICD-10-CM | POA: Insufficient documentation

## 2021-05-27 DIAGNOSIS — R194 Change in bowel habit: Secondary | ICD-10-CM | POA: Diagnosis present

## 2021-05-27 DIAGNOSIS — K5900 Constipation, unspecified: Secondary | ICD-10-CM | POA: Diagnosis not present

## 2021-05-27 DIAGNOSIS — I129 Hypertensive chronic kidney disease with stage 1 through stage 4 chronic kidney disease, or unspecified chronic kidney disease: Secondary | ICD-10-CM | POA: Insufficient documentation

## 2021-05-27 DIAGNOSIS — Z7902 Long term (current) use of antithrombotics/antiplatelets: Secondary | ICD-10-CM | POA: Diagnosis not present

## 2021-05-27 MED ORDER — LIDOCAINE VISCOUS HCL 2 % MT SOLN: 15.0000 mL | Freq: Once | OROMUCOSAL | Status: DC

## 2021-05-27 MED ORDER — FLEET ENEMA 7-19 GM/118ML RE ENEM
1.0000 | ENEMA | Freq: Once | RECTAL | Status: AC
  Administered 2021-05-27: 16:00:00 1 via RECTAL

## 2021-05-27 MED ORDER — HYDROCORTISONE ACETATE 25 MG RE SUPP
25.0000 mg | Freq: Once | RECTAL | Status: DC
  Filled 2021-05-27: qty 1

## 2021-05-27 MED ORDER — GLYCERIN (LAXATIVE) 2 G RE SUPP
1.0000 | Freq: Once | RECTAL | Status: DC
  Filled 2021-05-27: qty 1

## 2021-05-27 NOTE — Discharge Instructions (Addendum)
Use 1 tablespoon of MiraLAX a liquid every day.  Follow-up with your regular doctor as needed return if worsening

## 2021-05-27 NOTE — ED Triage Notes (Addendum)
Pt arrive via pov w/ c/o constipation since 6/22. Ambulatory to triage. Denies n/v. Pt states she has taken laxative yesterday. Pt states she had a large BM this morning around 0400. But still feels like she needs to go. Since taking laxatives pt reports mild abd cramping. Pt states "I think I need an enema and that will fix it". NAD noted at this time.

## 2021-05-27 NOTE — ED Provider Notes (Signed)
[facility] Emergency Department Provider Note  ____________________________________________   Event Date/Time   First MD Initiated Contact with Patient 05/27/21 1340     (approximate)  I have reviewed the triage vital signs and the nursing notes.   HISTORY  Chief Complaint Constipation    HPI Sarah Freeman is a 87 y.o. female presents emergency department with constipation.  Patient states she has been unable to have a good bowel movement in about a week.  States she took Dulcolax and MiraLAX and then took some mag citrate which enabled her to have a very small bowel movement but she still feels constipated.  Some abdominal pain after eating.  States she has had a colonoscopy previously and it was normal  Past Medical History:  Diagnosis Date   Hyperlipidemia    Hypothyroid    Stroke [facility])    Urinary incontinence     Patient Active Problem List   Diagnosis Date Noted   Aortic atherosclerosis ([place]) 02/27/2021   SVT (supraventricular tachycardia) ([place]) 10/11/2018   HTN (hypertension) 10/11/2018   Chronic kidney disease, stage 3 ([place]) 05/24/2018   Morbid obesity ([place]) 02/02/2018   Carotid stenosis 02/01/2018   Atherosclerosis 01/25/2018   Left arm weakness 01/18/2018   Statin intolerance 06/14/2017   Counseling regarding advanced directives and goals of care 02/22/2017   Personal history of transient ischemic attack (TIA), and cerebral infarction without residual deficits 02/25/2016   Weakness of left leg 02/25/2016   TIA (transient ischemic attack) 02/15/2016   Osteopenia 01/29/2016   Menopausal state 01/29/2016   Hypothyroidism 01/29/2016   GERD (gastroesophageal reflux disease) 01/29/2016   Hypercholesterolemia 01/29/2016   Lumbar spinal stenosis 01/29/2016   Stress incontinence 01/29/2016   Breast microcalcification, mammographic 06/17/2015    Past Surgical History:  Procedure Laterality Date   ABDOMINAL HYSTERECTOMY     age 69    BREAST BIOPSY Left 2000?   neg. dr. Dwyane Luo office   CHOLECYSTECTOMY  1964   COLONOSCOPY     Dr Nicolasa Ducking   DILATION AND CURETTAGE OF UTERUS     EYE SURGERY     LOOP RECORDER INSERTION N/A 02/09/2018   Procedure: LOOP RECORDER INSERTION;  Surgeon: Deboraha Sprang, MD;  Location: [place] [place] CV LAB;  Service: Cardiovascular;  Laterality: N/A;   SKIN CANCER EXCISION     face   TEE WITHOUT CARDIOVERSION N/A 02/09/2018   Procedure: TRANSESOPHAGEAL ECHOCARDIOGRAM (TEE);  Surgeon: Minna Merritts, MD;  Location: [facility] ORS;  Service: Cardiovascular;  Laterality: N/A;    Prior to Admission medications   Medication Sig Start Date End Date Taking? Authorizing Provider  acetaminophen (TYLENOL) 650 MG CR tablet Take 650 mg by mouth every 8 (eight) hours as needed for pain.    [provider]  clopidogrel (PLAVIX) 75 MG tablet TAKE 1 TABLET(75 MG) BY MOUTH DAILY 03/14/21   Johnson, Megan P, DO  diphenhydramine-acetaminophen (TYLENOL PM) 25-500 MG TABS tablet Take 1 tablet by mouth at bedtime as needed (for sleep).     [provider]  esomeprazole (NEXIUM) 40 MG capsule Take 1 capsule (40 mg total) by mouth daily. 01/07/20   Johnson, Megan P, DO  gabapentin (NEURONTIN) 100 MG capsule Take 1 capsule (100 mg total) by mouth 3 (three) times daily. 11/17/20   Cannady, Henrine Screws T, NP  HYDROcodone-acetaminophen (NORCO/VICODIN) 5-325 MG tablet Take 1 tablet by mouth every 8 (eight) hours as needed.    [provider]  Multiple Vitamins-Minerals (VITAMIN D3 COMPLETE PO)  Take by mouth.    [provider]  nitrofurantoin, macrocrystal-monohydrate, (MACROBID) 100 MG capsule Take 1 capsule (100 mg total) by mouth daily. Patient not taking: Reported on 01/16/2021 11/03/20   Bjorn Loser, MD  PROLIA 60 MG/ML SOSY injection Inject into the skin. 05/22/20   [provider]  PSYLLIUM HUSK PO Take by mouth.    [provider]  Red Yeast Rice Extract (RED YEAST RICE  PO) Take 1 capsule by mouth daily.     [provider]  Sennosides-Docusate Sodium (STOOL SOFTENER/LAXATIVE PO) Take by mouth.    [provider]  SYNTHROID 50 MCG tablet TAKE 1 TABLET(50 MCG) BY MOUTH AT BEDTIME 12/12/20   Johnson, Megan P, DO    Allergies Statins, Bacitracin, Neomycin, Neosporin + pain relief max st  [neomy-bacit-polymyx-pramoxine], Polymyxin b, and Neomycin-polymyxin-gramicidin  Family History  Problem Relation Age of Onset   Cancer Father        prostate   Stroke Father    Hypertension Sister    Hyperlipidemia Daughter    Heart disease Son    Diabetes Maternal Grandmother    Heart disease Maternal Grandmother    Heart disease Maternal Grandfather    Heart disease Daughter    CAD Mother    Failure to thrive Mother    Breast cancer Neg Hx     Social History Social History   Tobacco Use   Smoking status: Never   Smokeless tobacco: Never  Vaping Use   Vaping Use: Never used  Substance Use Topics   Alcohol use: No    Alcohol/week: 0.0 standard drinks   Drug use: No    Review of Systems  Constitutional: No fever/chills Eyes: No visual changes. ENT: No sore throat. Respiratory: Denies cough Cardiovascular: Denies chest pain Gastrointestinal: Denies abdominal pain, positive for constipation Genitourinary: Negative for dysuria. Musculoskeletal: Negative for back pain. Skin: Negative for rash. Psychiatric: no mood changes,     ____________________________________________   PHYSICAL EXAM:  VITAL SIGNS: ED Triage Vitals  Enc Vitals Group     BP 05/27/21 1227 128/82     Pulse Rate 05/27/21 1222 88     Resp 05/27/21 1222 16     Temp 05/27/21 1222 98.5 F (36.9 C)     Temp Source 05/27/21 1222 Oral     SpO2 05/27/21 1222 99 %     Weight 05/27/21 1223 175 lb (79.4 kg)     Height 05/27/21 1223 5' (1.524 m)     Head Circumference --      Peak Flow --      Pain Score 05/27/21 1223 5     Pain Loc --      Pain Edu? --       Excl. in [place]? --     Constitutional: Alert and oriented. Well appearing and in no acute distress. Eyes: Conjunctivae are normal.  Head: Atraumatic. Nose: No congestion/rhinnorhea. Mouth/Throat: Mucous membranes are moist.   Neck:  supple no lymphadenopathy noted Cardiovascular: Normal rate, regular rhythm. Heart sounds are normal Respiratory: Normal respiratory effort.  No retractions, lungs c t a  Abd: soft nontender bs normal all 4 quad GU: deferred Musculoskeletal: FROM all extremities, warm and well perfused Neurologic:  Normal speech and language.  Skin:  Skin is warm, dry and intact. No rash noted. Psychiatric: Mood and affect are normal. Speech and behavior are normal.  ____________________________________________   LABS (all labs ordered are listed, but only abnormal results are displayed)  Labs Reviewed -  No data to display ____________________________________________   ____________________________________________  RADIOLOGY  Abdomen 1 view  ____________________________________________   PROCEDURES  Procedure(s) performed: Fleets enema   Procedures    ____________________________________________   INITIAL IMPRESSION / ASSESSMENT AND PLAN / ED COURSE  Pertinent labs & imaging results that were available during my care of the patient were reviewed by me and considered in my medical decision making (see chart for details).   Patient is an 87 year old female presents with constipation.  See HPI.  Physical exam shows patient appears stable  X-ray of the abdomen reviewed by me confirmed by radiology to have moderate stool burden.  No stool ball noted  Fleets enema ordered.  Patient was able have a bowel movement.  States she feels better.  She was instructed to use 1 tablespoon of MiraLAX daily.  Follow-up with her regular doctor as needed.  Return emergency department worsening.  She was discharged in stable condition in the care of her son.     Sarah Freeman was evaluated in Emergency Department on 05/27/2021 for the symptoms described in the history of present illness. She was evaluated in the context of the global COVID-19 pandemic, which necessitated consideration that the patient might be at risk for infection with the SARS-CoV-2 virus that causes COVID-19. Institutional protocols and algorithms that pertain to the evaluation of patients at risk for COVID-19 are in a state of rapid change based on information released by regulatory bodies including the CDC and federal and state organizations. These policies and algorithms were followed during the patient's care in the ED.    As part of my medical decision making, I reviewed the following data within the [place] History obtained from family, Nursing notes reviewed and incorporated, Old chart reviewed, Radiograph reviewed , Notes from prior ED visits, and [place] Controlled Substance Database  ____________________________________________   FINAL CLINICAL IMPRESSION(S) / ED DIAGNOSES  Final diagnoses:  Constipation, unspecified constipation type      NEW MEDICATIONS STARTED DURING THIS VISIT:  New Prescriptions   No medications on file     Note:  This document was prepared using Dragon voice recognition software and may include unintentional dictation errors.    Versie Starks, PA-C 05/27/21 1620    Duffy Bruce, MD 05/31/21 [phone]

## 2021-06-18 ENCOUNTER — Ambulatory Visit: Payer: Medicare Other | Admitting: Family Medicine

## 2021-06-23 ENCOUNTER — Encounter: Payer: Self-pay | Admitting: Family Medicine

## 2021-06-23 ENCOUNTER — Other Ambulatory Visit: Payer: Self-pay

## 2021-06-23 ENCOUNTER — Ambulatory Visit (INDEPENDENT_AMBULATORY_CARE_PROVIDER_SITE_OTHER): Payer: Medicare Other | Admitting: Family Medicine

## 2021-06-23 VITALS — BP 142/76 | HR 83 | Temp 98.5°F | Ht 58.8 in | Wt 183.4 lb

## 2021-06-23 DIAGNOSIS — K219 Gastro-esophageal reflux disease without esophagitis: Secondary | ICD-10-CM

## 2021-06-23 DIAGNOSIS — I10 Essential (primary) hypertension: Secondary | ICD-10-CM

## 2021-06-23 DIAGNOSIS — N183 Chronic kidney disease, stage 3 unspecified: Secondary | ICD-10-CM

## 2021-06-23 DIAGNOSIS — Z789 Other specified health status: Secondary | ICD-10-CM

## 2021-06-23 DIAGNOSIS — E039 Hypothyroidism, unspecified: Secondary | ICD-10-CM | POA: Diagnosis not present

## 2021-06-23 DIAGNOSIS — I700 Atherosclerosis of aorta: Secondary | ICD-10-CM | POA: Diagnosis not present

## 2021-06-23 DIAGNOSIS — E7800 Pure hypercholesterolemia, unspecified: Secondary | ICD-10-CM

## 2021-06-23 DIAGNOSIS — I471 Supraventricular tachycardia: Secondary | ICD-10-CM

## 2021-06-23 DIAGNOSIS — K5900 Constipation, unspecified: Secondary | ICD-10-CM

## 2021-06-23 LAB — URINALYSIS, ROUTINE W REFLEX MICROSCOPIC
Bilirubin, UA: NEGATIVE
Glucose, UA: NEGATIVE
Ketones, UA: NEGATIVE
Nitrite, UA: NEGATIVE
Protein,UA: NEGATIVE
RBC, UA: NEGATIVE
Specific Gravity, UA: 1.015 (ref 1.005–1.030)
Urobilinogen, Ur: 0.2 mg/dL (ref 0.2–1.0)
pH, UA: 6 (ref 5.0–7.5)

## 2021-06-23 LAB — MICROALBUMIN, URINE WAIVED
Creatinine, Urine Waived: 100 mg/dL (ref 10–300)
Microalb, Ur Waived: 10 mg/L (ref 0–19)
Microalb/Creat Ratio: <30 mg/g (ref <30)

## 2021-06-23 LAB — MICROSCOPIC EXAMINATION
Bacteria, UA: NONE SEEN
Epithelial Cells (non renal): NONE SEEN /hpf (ref 0–10)
RBC, Urine: NONE SEEN /hpf (ref 0–2)

## 2021-06-23 MED ORDER — CLOPIDOGREL BISULFATE 75 MG PO TABS
75.0000 mg | ORAL_TABLET | Freq: Every day | ORAL | 1 refills | Status: DC
Start: 2021-06-23 — End: 2021-12-29

## 2021-06-23 MED ORDER — ESOMEPRAZOLE MAGNESIUM 40 MG PO CPDR
40.0000 mg | DELAYED_RELEASE_CAPSULE | Freq: Every day | ORAL | 1 refills | Status: DC
Start: 2021-06-23 — End: 2021-12-29

## 2021-06-23 NOTE — Assessment & Plan Note (Addendum)
Statin intolerant. Rechecking labs today. Await results.

## 2021-06-23 NOTE — Assessment & Plan Note (Signed)
Under good control on current regimen. Continue current regimen. Continue to monitor. Call with any concerns. Refills given. Labs drawn today.   

## 2021-06-23 NOTE — Assessment & Plan Note (Signed)
Rechecking labs today. Await results. Treat as needed.  °

## 2021-06-23 NOTE — Assessment & Plan Note (Signed)
Statin intolerant. Rechecking labs today. Await results.

## 2021-06-23 NOTE — Patient Instructions (Addendum)
Medicines you DO need to take:  Synthroid Gabapentin Plavix Esomeprazole Miralax  All of the other medicines can be taken as needed.

## 2021-06-23 NOTE — Assessment & Plan Note (Signed)
Will keep her BP and cholesterol under good control. Continue to monitor.  

## 2021-06-23 NOTE — Assessment & Plan Note (Signed)
Under good control off medicine. Continue current regimen. Continue to monitor. Call with any concerns. Rechecking labs today.

## 2021-06-23 NOTE — Progress Notes (Signed)
BP (!) 142/76   Pulse 83   Temp 98.5 F (36.9 C) (Oral)   Ht 4' 10.8" (1.494 m)   Wt 183 lb 6.4 oz (83.2 kg)   SpO2 98%   BMI 37.29 kg/m    Subjective:    Patient ID: Sarah Freeman, female    DOB: 21-May-1931, 87 y.o.   MRN: [id number]  HPI: Sarah Freeman is a 87 y.o. female  Chief Complaint  Patient presents with   ER Follow Up    Pt states she has been having regular bowel movements since her ER visit. States she started taking miralax daily.    ER FOLLOW UP Time since discharge: about 1 month Hospital/facility: [facility] Diagnosis: Constipation Procedures/tests: CLINICAL DATA:  Constipated, no bowel movement since last Wednesday   EXAM: ABDOMEN - 1 VIEW   COMPARISON:  None.   FINDINGS: No evidence of bowel obstruction. There is a moderate colonic stool burden, predominantly in the rectosigmoid and descending colon. There is no acute osseous abnormality. There are lower lumbar spine and bilateral SI joint degenerative changes.   IMPRESSION: No evidence of bowel obstruction. Moderate colonic stool burden predominantly in the rectosigmoid and descending colon.  Consultants: None New medications: miralax Discharge instructions:  None Status: better  Has been doing well. Having BMs every day. Feeling well since she got out of the hospital. No concerns.   HYPERTENSION / HYPERLIPIDEMIA Satisfied with current treatment? yes Duration of hypertension: chronic BP monitoring frequency: not checking BP medication side effects: not on anything Duration of hyperlipidemia: chronic Cholesterol medication side effects: yes Cholesterol supplements: red yeast rice Medication compliance:  not on anything Aspirin: no Recent stressors: no Recurrent headaches: no Visual changes: no Palpitations: no Dyspnea: no Chest pain: no Lower extremity edema: no Dizzy/lightheaded: no  HYPOTHYROIDISM Thyroid control status:controlled Satisfied with current treatment?  yes Medication side effects: no Medication compliance: excellent compliance Recent dose adjustment:no Fatigue: no Cold intolerance: no Heat intolerance: no Weight gain: no Weight loss: no Constipation: yes Diarrhea/loose stools: no Palpitations: no Lower extremity edema: no Anxiety/depressed mood: no  GERD GERD control status: controlled Satisfied with current treatment? yes Heartburn frequency:  Medication side effects: no  Medication compliance: excellent Previous GERD medications: nexium Dysphagia: no Odynophagia:  no Hematemesis: no Blood in stool: no EGD: no   Relevant past medical, surgical, family and social history reviewed and updated as indicated. Interim medical history since our last visit reviewed. Allergies and medications reviewed and updated.  Review of Systems  Constitutional: Negative.   Respiratory: Negative.    Cardiovascular: Negative.   Gastrointestinal:  Negative for abdominal distention, abdominal pain, anal bleeding, blood in stool, constipation, diarrhea, nausea, rectal pain and vomiting.  Musculoskeletal: Negative.   Skin: Negative.   Psychiatric/Behavioral: Negative.     Per HPI unless specifically indicated above     Objective:    BP (!) 142/76   Pulse 83   Temp 98.5 F (36.9 C) (Oral)   Ht 4' 10.8" (1.494 m)   Wt 183 lb 6.4 oz (83.2 kg)   SpO2 98%   BMI 37.29 kg/m   Wt Readings from Last 3 Encounters:  06/23/21 183 lb 6.4 oz (83.2 kg)  05/27/21 175 lb (79.4 kg)  01/16/21 174 lb (78.9 kg)    Physical Exam Vitals and nursing note reviewed.  Constitutional:      General: She is not in acute distress.    Appearance: Normal appearance. She is not ill-appearing, toxic-appearing or diaphoretic.  HENT:  Head: Normocephalic and atraumatic.     Right Ear: External ear normal.     Left Ear: External ear normal.     Nose: Nose normal.     Mouth/Throat:     Mouth: Mucous membranes are moist.     Pharynx: Oropharynx is clear.   Eyes:     General: No scleral icterus.       Right eye: No discharge.        Left eye: No discharge.     Extraocular Movements: Extraocular movements intact.     Conjunctiva/sclera: Conjunctivae normal.     Pupils: Pupils are equal, round, and reactive to light.  Cardiovascular:     Rate and Rhythm: Normal rate and regular rhythm.     Pulses: Normal pulses.     Heart sounds: Normal heart sounds. No murmur heard.   No friction rub. No gallop.  Pulmonary:     Effort: Pulmonary effort is normal. No respiratory distress.     Breath sounds: Normal breath sounds. No stridor. No wheezing, rhonchi or rales.  Chest:     Chest wall: No tenderness.  Musculoskeletal:        General: Normal range of motion.     Cervical back: Normal range of motion and neck supple.  Skin:    General: Skin is warm and dry.     Capillary Refill: Capillary refill takes less than 2 seconds.     Coloration: Skin is not jaundiced or pale.     Findings: No bruising, erythema, lesion or rash.  Neurological:     General: No focal deficit present.     Mental Status: She is alert and oriented to person, place, and time. Mental status is at baseline.  Psychiatric:        Mood and Affect: Mood normal.        Behavior: Behavior normal.        Thought Content: Thought content normal.        Judgment: Judgment normal.    Results for orders placed or performed during the hospital encounter of 10/02/20  CBC with Differential/Platelet  Result Value Ref Range   WBC 6.7 4.0 - 10.5 K/uL   RBC 3.78 (L) 3.87 - 5.11 MIL/uL   Hemoglobin 12.3 12.0 - 15.0 g/dL   HCT 37.5 36.0 - 46.0 %   MCV 99.2 80.0 - 100.0 fL   MCH 32.5 26.0 - 34.0 pg   MCHC 32.8 30.0 - 36.0 g/dL   RDW 12.3 11.5 - 15.5 %   Platelets 247 150 - 400 K/uL   nRBC 0.0 0.0 - 0.2 %   Neutrophils Relative % 46 %   Neutro Abs 3.1 1.7 - 7.7 K/uL   Lymphocytes Relative 37 %   Lymphs Abs 2.5 0.7 - 4.0 K/uL   Monocytes Relative 10 %   Monocytes Absolute 0.7 0.1 -  1.0 K/uL   Eosinophils Relative 5 %   Eosinophils Absolute 0.3 0.0 - 0.5 K/uL   Basophils Relative 2 %   Basophils Absolute 0.1 0.0 - 0.1 K/uL   Immature Granulocytes 0 %   Abs Immature Granulocytes 0.01 0.00 - 0.07 K/uL  Sedimentation rate  Result Value Ref Range   Sed Rate 13 0 - 30 mm/hr  Miscellaneous [facility] test (send-out)  Result Value Ref Range   [facility] test code [id number]    [facility] test name MYASTHENIA GRAVIS PROFILE    Misc [facility] result COMMENT       Assessment & Plan:  Problem List Items Addressed This Visit       Cardiovascular and Mediastinum   SVT (supraventricular tachycardia) (HCC)    HR under good control today. Continue to monitor.        HTN (hypertension)    Under good control off medicine. Continue current regimen. Continue to monitor. Call with any concerns. Rechecking labs today.        Relevant Orders   CBC with Differential/Platelet   Comprehensive metabolic panel   Microalbumin, Urine Waived   Aortic atherosclerosis ([place])    Will keep her BP and cholesterol under good control. Continue to monitor.        Relevant Orders   CBC with Differential/Platelet   Comprehensive metabolic panel     Digestive   GERD (gastroesophageal reflux disease)    Under good control on current regimen. Continue current regimen. Continue to monitor. Call with any concerns. Refills given. Labs drawn today.        Relevant Medications   polyethylene glycol (MIRALAX / GLYCOLAX) 17 g packet   esomeprazole (NEXIUM) 40 MG capsule     Endocrine   Hypothyroidism    Rechecking labs today. Await results. Treat as needed.        Relevant Orders   CBC with Differential/Platelet   Comprehensive metabolic panel   TSH     Genitourinary   Chronic kidney disease, stage 3 ([place])    Rechecking labs today. Await results.        Relevant Orders   CBC with Differential/Platelet   Comprehensive metabolic panel   Urinalysis, Routine w reflex microscopic      Other   Hypercholesterolemia    Statin intolerant. Rechecking labs today. Await results.        Relevant Orders   CBC with Differential/Platelet   Comprehensive metabolic panel   Lipid Panel w/o Chol/HDL Ratio   Statin intolerance    Statin intolerant. Rechecking labs today. Await results.        Morbid obesity ([place])    Encouraged diet and exercise with goal of losing 1-2 lbs per week       Other Visit Diagnoses     Constipation, unspecified constipation type    -  Primary   Resolved. Continue miralax. Call with any concerns.         Follow up plan: Return in about 6 months (around 12/24/2021) for physical.

## 2021-06-23 NOTE — Assessment & Plan Note (Signed)
Rechecking labs today. Await results.  

## 2021-06-23 NOTE — Assessment & Plan Note (Signed)
HR under good control today. Continue to monitor.

## 2021-06-23 NOTE — Assessment & Plan Note (Signed)
Encouraged diet and exercise with goal of losing 1-2lbs per week.  

## 2021-06-24 ENCOUNTER — Encounter: Payer: Self-pay | Admitting: Family Medicine

## 2021-06-24 LAB — LIPID PANEL W/O CHOL/HDL RATIO
Cholesterol, Total: 215 mg/dL — ABNORMAL HIGH (ref 100–199)
HDL: 45 mg/dL (ref >39)
LDL Chol Calc (NIH): 130 mg/dL — ABNORMAL HIGH (ref 0–99)
Triglycerides: 226 mg/dL — ABNORMAL HIGH (ref 0–149)
VLDL Cholesterol Cal: 40 mg/dL (ref 5–40)

## 2021-06-24 LAB — CBC WITH DIFFERENTIAL/PLATELET
Basophils Absolute: 0.1 10*3/uL (ref 0.0–0.2)
Basos: 2 %
EOS (ABSOLUTE): 0.2 10*3/uL (ref 0.0–0.4)
Eos: 3 %
Hematocrit: 35.7 % (ref 34.0–46.6)
Hemoglobin: 12 g/dL (ref 11.1–15.9)
Immature Grans (Abs): 0 10*3/uL (ref 0.0–0.1)
Immature Granulocytes: 0 %
Lymphocytes Absolute: 2.6 10*3/uL (ref 0.7–3.1)
Lymphs: 36 %
MCH: 32 pg (ref 26.6–33.0)
MCHC: 33.6 g/dL (ref 31.5–35.7)
MCV: 95 fL (ref 79–97)
Monocytes Absolute: 0.8 10*3/uL (ref 0.1–0.9)
Monocytes: 11 %
Neutrophils Absolute: 3.4 10*3/uL (ref 1.4–7.0)
Neutrophils: 48 %
Platelets: 324 10*3/uL (ref 150–450)
RBC: 3.75 x10E6/uL — ABNORMAL LOW (ref 3.77–5.28)
RDW: 12.7 % (ref 11.7–15.4)
WBC: 7.1 10*3/uL (ref 3.4–10.8)

## 2021-06-24 LAB — COMPREHENSIVE METABOLIC PANEL
ALT: 10 IU/L (ref 0–32)
AST: 15 IU/L (ref 0–40)
Albumin/Globulin Ratio: 1.7 (ref 1.2–2.2)
Albumin: 3.9 g/dL (ref 3.6–4.6)
Alkaline Phosphatase: 42 IU/L — ABNORMAL LOW (ref 44–121)
BUN/Creatinine Ratio: 16 (ref 12–28)
BUN: 13 mg/dL (ref 8–27)
Bilirubin Total: 0.3 mg/dL (ref 0.0–1.2)
CO2: 22 mmol/L (ref 20–29)
Calcium: 8.8 mg/dL (ref 8.7–10.3)
Chloride: 101 mmol/L (ref 96–106)
Creatinine, Ser: 0.81 mg/dL (ref 0.57–1.00)
Globulin, Total: 2.3 g/dL (ref 1.5–4.5)
Glucose: 93 mg/dL (ref 65–99)
Potassium: 4.6 mmol/L (ref 3.5–5.2)
Sodium: 136 mmol/L (ref 134–144)
Total Protein: 6.2 g/dL (ref 6.0–8.5)
eGFR: 70 mL/min/{1.73_m2} (ref >59)

## 2021-06-24 LAB — TSH: TSH: 2.25 u[IU]/mL (ref 0.450–4.500)

## 2021-06-24 MED ORDER — LEVOTHYROXINE SODIUM 50 MCG PO TABS
ORAL_TABLET | ORAL | 3 refills | Status: DC
Start: 2021-06-24 — End: 2022-07-14

## 2021-07-06 ENCOUNTER — Telehealth: Payer: Self-pay | Admitting: *Deleted

## 2021-07-06 NOTE — Telephone Encounter (Signed)
RX coupon printed and placed up front for patient pick up. Called and notified patient that this was ready for her.

## 2021-07-06 NOTE — Telephone Encounter (Signed)
Can get 90 day supply for about $15 on goodrx, but my printer won't print it- please print it for [facility] and give to patient.

## 2021-07-06 NOTE — Telephone Encounter (Signed)
She is likely in the donut hole. We can check goodrx to see if we can get it cheaper

## 2021-07-06 NOTE — Telephone Encounter (Signed)
Pt came in about her medication Synthroid 50 mcg. She went to get a refill and it is usually $49 is now $99 She want to know if it is something else can be sent in or help get her a lower price Thanks Please advise Pt. Would like a call [phone]

## 2021-07-07 ENCOUNTER — Other Ambulatory Visit: Payer: Self-pay | Admitting: Family Medicine

## 2021-07-08 NOTE — Telephone Encounter (Signed)
Per [employer] form 06/24/2021 is ready for pick up. Refill not needed

## 2021-07-10 ENCOUNTER — Other Ambulatory Visit: Payer: Self-pay

## 2021-07-10 MED ORDER — NITROFURANTOIN MONOHYD MACRO 100 MG PO CAPS: 100.0000 mg | ORAL_CAPSULE | Freq: Every day | ORAL | 3 refills | Status: DC

## 2021-07-22 ENCOUNTER — Telehealth: Payer: Self-pay | Admitting: Family Medicine

## 2021-07-22 NOTE — Telephone Encounter (Signed)
Pt has been getting Polio shots every year, twice a year for the last 3 years at [facility]  / pt wants to know if she can get her next one in the office / at the hospital it cost $295 this year and her insurance covers some but not all/ please advise   Pt was advised by [facility] that she is due for her next one in January

## 2021-07-23 NOTE — Telephone Encounter (Signed)
Will leave for Dr. Johnson review:) ?

## 2021-07-23 NOTE — Telephone Encounter (Signed)
Spoke with patient and she is asking if she can have her Prolia injections performed here in office instead of [place]. Patient says they have been sending her orders for the injection over to [facility] and it has been an inconvenience for her as she lives in [place]. Please advise?

## 2021-07-23 NOTE — Telephone Encounter (Signed)
Please contact patient for more information. Is she talking about her Prolia injection?

## 2021-07-24 NOTE — Telephone Encounter (Signed)
Patient notified, patient states that the office will not give her a prescription, so she will just continue to go to [place].

## 2021-07-24 NOTE — Telephone Encounter (Signed)
Yes she should be able to get them in in the office- we do not carry it, so she will need to pick up the Rx from the pharmacy. I don't know what that cost will be.

## 2021-08-25 ENCOUNTER — Ambulatory Visit: Payer: Self-pay | Admitting: *Deleted

## 2021-08-25 ENCOUNTER — Ambulatory Visit (INDEPENDENT_AMBULATORY_CARE_PROVIDER_SITE_OTHER): Payer: Medicare Other | Admitting: Nurse Practitioner

## 2021-08-25 ENCOUNTER — Encounter: Payer: Self-pay | Admitting: Nurse Practitioner

## 2021-08-25 ENCOUNTER — Other Ambulatory Visit: Payer: Self-pay

## 2021-08-25 VITALS — Temp 97.8°F | Wt 182.0 lb

## 2021-08-25 DIAGNOSIS — R42 Dizziness and giddiness: Secondary | ICD-10-CM | POA: Diagnosis not present

## 2021-08-25 DIAGNOSIS — H6121 Impacted cerumen, right ear: Secondary | ICD-10-CM | POA: Diagnosis not present

## 2021-08-25 NOTE — Telephone Encounter (Signed)
Pt called in twice and the line disconnected both times.  I tried calling her back 3 times but got a busy signal all 3 times.  I forwarded this information to [facility].

## 2021-08-25 NOTE — Telephone Encounter (Signed)
Appointment today,

## 2021-08-25 NOTE — Patient Instructions (Signed)
Debrox ear drops to help break up ear wax. Put about 10 drops in each ear and let it sit for 10-15 minutes.  Make sure you are drinking plenty of fluids

## 2021-08-25 NOTE — Telephone Encounter (Signed)
noted 

## 2021-08-25 NOTE — Progress Notes (Signed)
Acute Office Visit  Subjective:    Patient ID: Sarah Freeman, female    DOB: November 22, 1931, 87 y.o.   MRN: [id number]  Chief Complaint  Patient presents with   Dizziness    Pt states when she bends over she becomes dizzy. Been ongoing for 3 days. Episodes come and goes. Usually gets better without medication intervention.     HPI Patient is in today for dizziness for the last 3 days. She states she has had trouble with intermittent dizziness since she was a child.  DIZZINESS  Duration: days Description of symptoms: room spinning Duration of episode: seconds Dizziness frequency: recurrent Provoking factors:  bending over, turning her head Aggravating factors:   bending over, turning her head Triggered by rolling over in bed: no Triggered by bending over: yes Aggravated by head movement: yes Aggravated by exertion, coughing, loud noises: no Recent head injury: no Recent or current viral symptoms: no History of vasovagal episodes: no Nausea: no Vomiting: no Tinnitus: no Hearing loss: no Aural fullness: no Headache: yes Photophobia/phonophobia: no Unsteady gait: no Postural instability: no Diplopia, dysarthria, dysphagia or weakness: no Related to exertion: no Pallor: no Diaphoresis: no Dyspnea: no Chest pain: no   Past Medical History:  Diagnosis Date   Hyperlipidemia    Hypothyroid    Stroke [facility])    Urinary incontinence     Past Surgical History:  Procedure Laterality Date   ABDOMINAL HYSTERECTOMY     age 60   BREAST BIOPSY Left 2000?   neg. dr. [place] [facility] office   CHOLECYSTECTOMY  1964   COLONOSCOPY     Dr Maryruth Bun   DILATION AND CURETTAGE OF UTERUS     EYE SURGERY     LOOP RECORDER INSERTION N/A 02/09/2018   Procedure: LOOP RECORDER INSERTION;  Surgeon: Duke Salvia, MD;  Location: [facility] INVASIVE CV LAB;  Service: Cardiovascular;  Laterality: N/A;   SKIN CANCER EXCISION     face   TEE WITHOUT CARDIOVERSION N/A 02/09/2018   Procedure:  TRANSESOPHAGEAL ECHOCARDIOGRAM (TEE);  Surgeon: Antonieta Iba, MD;  Location: [facility] ORS;  Service: Cardiovascular;  Laterality: N/A;    Family History  Problem Relation Age of Onset   Cancer Father        prostate   Stroke Father    Hypertension Sister    Hyperlipidemia Daughter    Heart disease Son    Diabetes Maternal Grandmother    Heart disease Maternal Grandmother    Heart disease Maternal Grandfather    Heart disease Daughter    CAD Mother    Failure to thrive Mother    Breast cancer Neg Hx     Social History   Socioeconomic History   Marital status: Widowed    Spouse name: Not on file   Number of children: Not on file   Years of education: Not on file   Highest education level: Not on file  Occupational History   Occupation: retired  Tobacco Use   Smoking status: Never   Smokeless tobacco: Never  Vaping Use   Vaping Use: Never used  Substance and Sexual Activity   Alcohol use: No    Alcohol/week: 0.0 standard drinks   Drug use: No   Sexual activity: Not Currently  Other Topics Concern   Not on file  Social History Narrative   Not on file   Social Determinants of Health   Financial Resource Strain: Low Risk    Difficulty of Paying Living Expenses: Not hard at all  Food Insecurity: No Food Insecurity   Worried About Charity fundraiser in the Last Year: Never true   Ran Out of Food in the Last Year: Never true  Transportation Needs: No Transportation Needs   Lack of Transportation (Medical): No   Lack of Transportation (Non-Medical): No  Physical Activity: Insufficiently Active   Days of Exercise per Week: 3 days   Minutes of Exercise per Session: 20 min  Stress: No Stress Concern Present   Feeling of Stress : Not at all  Social Connections: Not on file  Intimate Partner Violence: Not on file    Outpatient Medications Prior to Visit  Medication Sig Dispense Refill   acetaminophen (TYLENOL) 650 MG CR tablet Take 650 mg by mouth every 8 (eight)  hours as needed for pain.     clopidogrel (PLAVIX) 75 MG tablet Take 1 tablet (75 mg total) by mouth daily. 90 tablet 1   diphenhydramine-acetaminophen (TYLENOL PM) 25-500 MG TABS tablet Take 1 tablet by mouth at bedtime as needed (for sleep).      esomeprazole (NEXIUM) 40 MG capsule Take 1 capsule (40 mg total) by mouth daily. 90 capsule 1   gabapentin (NEURONTIN) 100 MG capsule Take 1 capsule (100 mg total) by mouth 3 (three) times daily. 270 capsule 4   levothyroxine (SYNTHROID) 50 MCG tablet TAKE 1 TABLET(50 MCG) BY MOUTH AT BEDTIME 90 tablet 3   Multiple Vitamins-Minerals (VITAMIN D3 COMPLETE PO) Take by mouth.     nitrofurantoin, macrocrystal-monohydrate, (MACROBID) 100 MG capsule Take 1 capsule (100 mg total) by mouth daily. 90 capsule 3   polyethylene glycol (MIRALAX / GLYCOLAX) 17 g packet Take 17 g by mouth daily.     PSYLLIUM HUSK PO Take by mouth.     Red Yeast Rice Extract (RED YEAST RICE PO) Take 1 capsule by mouth daily.      Sennosides-Docusate Sodium (STOOL SOFTENER/LAXATIVE PO) Take by mouth.     HYDROcodone-acetaminophen (NORCO/VICODIN) 5-325 MG tablet Take 1 tablet by mouth every 8 (eight) hours as needed. (Patient not taking: Reported on 08/25/2021)     PROLIA 60 MG/ML SOSY injection Inject 60 mg into the skin every 6 (six) months. (Patient not taking: Reported on 08/25/2021)     No facility-administered medications prior to visit.    Allergies  Allergen Reactions   Statins Other (See Comments)    Leg cramps Leg cramps   Bacitracin    Neomycin    Neosporin + Pain Relief Max St  [Neomy-Bacit-Polymyx-Pramoxine]    Polymyxin B    Neomycin-Polymyxin-Gramicidin Other (See Comments)    Pt states wounds do not heal with neosporin Pt states wounds do not heal with neosporin    Review of Systems  Constitutional: Negative.   Respiratory: Negative.    Cardiovascular: Negative.   Gastrointestinal: Negative.   Genitourinary: Negative.   Neurological:  Positive for  dizziness and headaches.      Objective:    Physical Exam Vitals and nursing note reviewed.  Constitutional:      General: She is not in acute distress.    Appearance: Normal appearance.  HENT:     Head: Normocephalic.     Right Ear: Ear canal and external ear normal. There is impacted cerumen.     Left Ear: Tympanic membrane, ear canal and external ear normal.  Eyes:     Extraocular Movements: Extraocular movements intact.     Conjunctiva/sclera: Conjunctivae normal.     Pupils: Pupils are equal, round, and reactive to  light.  Cardiovascular:     Rate and Rhythm: Normal rate and regular rhythm.     Pulses: Normal pulses.     Heart sounds: Normal heart sounds.  Pulmonary:     Effort: Pulmonary effort is normal.     Breath sounds: Normal breath sounds.  Musculoskeletal:     Cervical back: Normal range of motion.  Skin:    General: Skin is warm.  Neurological:     General: No focal deficit present.     Mental Status: She is alert and oriented to person, place, and time.  Psychiatric:        Mood and Affect: Mood normal.        Behavior: Behavior normal.        Thought Content: Thought content normal.        Judgment: Judgment normal.    Temp 97.8 F (36.6 C) (Oral)   Wt 182 lb (82.6 kg)   SpO2 97%   BMI 37.01 kg/m  Wt Readings from Last 3 Encounters:  08/25/21 182 lb (82.6 kg)  06/23/21 183 lb 6.4 oz (83.2 kg)  05/27/21 175 lb (79.4 kg)   Orthostatic VS for the past 72 hrs (Last 3 readings):  Orthostatic BP Patient Position Orthostatic Pulse  08/25/21 1038 138/80 Lying left side 79  08/25/21 1034 119/78 Standing 102  08/25/21 1028 124/82 Sitting 87    Health Maintenance Due  Topic Date Due   COVID-19 Vaccine (4 - Booster for Pfizer series) 12/30/2020   INFLUENZA VACCINE  06/29/2021    There are no preventive care reminders to display for this patient.   Lab Results  Component Value Date   TSH 2.250 06/23/2021   Lab Results  Component Value Date    WBC 7.1 06/23/2021   HGB 12.0 06/23/2021   HCT 35.7 06/23/2021   MCV 95 06/23/2021   PLT 324 06/23/2021   Lab Results  Component Value Date   NA 136 06/23/2021   K 4.6 06/23/2021   CO2 22 06/23/2021   GLUCOSE 93 06/23/2021   BUN 13 06/23/2021   CREATININE 0.81 06/23/2021   BILITOT 0.3 06/23/2021   ALKPHOS 42 (L) 06/23/2021   AST 15 06/23/2021   ALT 10 06/23/2021   PROT 6.2 06/23/2021   ALBUMIN 3.9 06/23/2021   CALCIUM 8.8 06/23/2021   ANIONGAP 10 01/19/2018   EGFR 70 06/23/2021   Lab Results  Component Value Date   CHOL 215 (H) 06/23/2021   Lab Results  Component Value Date   HDL 45 06/23/2021   Lab Results  Component Value Date   LDLCALC 130 (H) 06/23/2021   Lab Results  Component Value Date   TRIG 226 (H) 06/23/2021   Lab Results  Component Value Date   CHOLHDL 4.0 01/19/2018   Lab Results  Component Value Date   HGBA1C 6.1 (H) 01/18/2018       Assessment & Plan:   Problem List Items Addressed This Visit   None Visit Diagnoses     Dizziness    -  Primary   Symtpoms consistent with benign positional vertigo vs cerumen impaction. Declines cerumen removal in office, will take care of at home. Drink plenty of fluids   Impacted cerumen of right ear       Declined to have ear cleaned here. States she will clean at home. Educated on debrox ear drops.         No orders of the defined types were placed in this encounter.  Charyl Dancer, NP

## 2021-08-25 NOTE — Telephone Encounter (Signed)
Answer Assessment - Initial Assessment Questions 1. DESCRIPTION: "Describe your dizziness."     Pt called in twice and her line disconnected.   I tried calling her back twice and got a busy signal both times.   I tried a 3rd time without success.   Busy signal again.  2. LIGHTHEADED: "Do you feel lightheaded?" (e.g., somewhat faint, woozy, weak upon standing)     *No Answer* 3. VERTIGO: "Do you feel like either you or the room is spinning or tilting?" (i.e. vertigo)     *No Answer* 4. SEVERITY: "How bad is it?"  "Do you feel like you are going to faint?" "Can you stand and walk?"   - MILD: Feels slightly dizzy, but walking normally.   - MODERATE: Feels unsteady when walking, but not falling; interferes with normal activities (e.g., school, work).   - SEVERE: Unable to walk without falling, or requires assistance to walk without falling; feels like passing out now.      *No Answer* 5. ONSET:  "When did the dizziness begin?"     *No Answer* 6. AGGRAVATING FACTORS: "Does anything make it worse?" (e.g., standing, change in head position)     *No Answer* 7. HEART RATE: "Can you tell me your heart rate?" "How many beats in 15 seconds?"  (Note: not all patients can do this)       *No Answer* 8. CAUSE: "What do you think is causing the dizziness?"     *No Answer* 9. RECURRENT SYMPTOM: "Have you had dizziness before?" If Yes, ask: "When was the last time?" "What happened that time?"     *No Answer* 10. OTHER SYMPTOMS: "Do you have any other symptoms?" (e.g., fever, chest pain, vomiting, diarrhea, bleeding)       *No Answer* 11. PREGNANCY: "Is there any chance you are pregnant?" "When was your last menstrual period?"       *No Answer*  Protocols used: Dizziness - Lightheadedness-A-AH

## 2021-08-25 NOTE — Telephone Encounter (Signed)
1- feels dizzy and drunk 2-occurs when up and moving around- feels drunk 3-no 4- mild 5-3 days ago- steadily getting worse 6- just when up and moving around 7- not sure 8- patient is on a lot of medication- no changes recently 9-yes- never found cause 10-no other symptoms 11- n/a  Patient has new onset dizziness- she states she only gets dizzy when up and moving. Patient states she thinks she is on too much medication- but has not had recent changes. Appointment scheduled.  Reason for Disposition . [1] MILD dizziness (e.g., walking normally) AND [2] has NOT been evaluated by physician for this  (Exception: dizziness caused by heat exposure, sudden standing, or poor fluid intake)  Protocols used: Dizziness - Lightheadedness-A-AH

## 2021-10-23 ENCOUNTER — Ambulatory Visit: Payer: Self-pay

## 2021-10-23 NOTE — Telephone Encounter (Signed)
Patient called and days for the past 2 weeks she's been having pain with urination at all times. She says she usually will have to call the office for something to be called in when it gets this bad. She says she's not having any other symptoms. I advised an appointment, she refuses and says to just call something in to the pharmacy and she will go pick it up. I advised the office is not open until Monday, she says she will wait until Monday. I advised worsening symptoms to go the UC, she verbalized understanding.    Summary: urinary disomcomfort / rx request   The patient believes they have a kidney infection and would like to be prescribed something to treat their concern   The patient has declined to schedule an appointment to be seen in person   The patient has been experiencing discomfort and frequent urination for roughly two weeks   Please contact further      Reason for Disposition  Age > 50 years  Answer Assessment - Initial Assessment Questions 1. SEVERITY: "How bad is the pain?"  (e.g., Scale 1-10; mild, moderate, or severe)   - MILD (1-3): complains slightly about urination hurting   - MODERATE (4-7): interferes with normal activities     - SEVERE (8-10): excruciating, unwilling or unable to urinate because of the pain      Mild 2. FREQUENCY: "How many times have you had painful urination today?"      4 times this morning 3. PATTERN: "Is pain present every time you urinate or just sometimes?"      All the time 4. ONSET: "When did the painful urination start?"      2 weeks ago 5. FEVER: "Do you have a fever?" If Yes, ask: "What is your temperature, how was it measured, and when did it start?"     No 6. PAST UTI: "Have you had a urine infection before?" If Yes, ask: "When was the last time?" and "What happened that time?"      Yes 7. CAUSE: "What do you think is causing the painful urination?"  (e.g., UTI, scratch, Herpes sore)     UTI 8. OTHER SYMPTOMS: "Do you have any  other symptoms?" (e.g., flank pain, vaginal discharge, genital sores, urgency, blood in urine)     No 9. PREGNANCY: "Is there any chance you are pregnant?" "When was your last menstrual period?"     No  Protocols used: Urination Pain - Female-A-AH

## 2021-10-26 ENCOUNTER — Other Ambulatory Visit: Payer: Self-pay

## 2021-10-26 ENCOUNTER — Ambulatory Visit (INDEPENDENT_AMBULATORY_CARE_PROVIDER_SITE_OTHER): Payer: Medicare Other | Admitting: Nurse Practitioner

## 2021-10-26 ENCOUNTER — Ambulatory Visit: Payer: Self-pay | Admitting: *Deleted

## 2021-10-26 ENCOUNTER — Encounter: Payer: Self-pay | Admitting: Nurse Practitioner

## 2021-10-26 VITALS — BP 109/65 | HR 79 | Temp 97.8°F | Resp 18 | Wt 180.8 lb

## 2021-10-26 DIAGNOSIS — N3000 Acute cystitis without hematuria: Secondary | ICD-10-CM

## 2021-10-26 DIAGNOSIS — R300 Dysuria: Secondary | ICD-10-CM

## 2021-10-26 MED ORDER — SULFAMETHOXAZOLE-TRIMETHOPRIM 800-160 MG PO TABS: 1.0000 | ORAL_TABLET | Freq: Two times a day (BID) | ORAL | 0 refills | Status: DC

## 2021-10-26 NOTE — Progress Notes (Signed)
Acute Office Visit  Subjective:    Patient ID: Sarah Freeman, female    DOB: 1930-12-20, 87 y.o.   MRN: [id number]  Chief Complaint  Patient presents with   Urinary Tract Infection    Burning and frequency for 2 weeks    HPI Patient is in today for urinary burning and frequency for 2 weeks.  URINARY SYMPTOMS  Dysuria: burning Urinary frequency: yes Urgency: yes Small volume voids: yes Symptom severity:  moderate Urinary incontinence: yes - chronic Foul odor: no Hematuria: no Abdominal pain: no Back pain: no Suprapubic pain/pressure: yes Flank pain: no Fever:  no Vomiting: no Relief with cranberry juice:  n/a Relief with pyridium:  n/a Status: worse Previous urinary tract infection: yes Recurrent urinary tract infection: no Treatments attempted: increasing fluids    Past Medical History:  Diagnosis Date   Hyperlipidemia    Hypothyroid    Stroke [facility])    Urinary incontinence     Past Surgical History:  Procedure Laterality Date   ABDOMINAL HYSTERECTOMY     age 82   BREAST BIOPSY Left 2000?   neg. dr. Dwyane Luo office   CHOLECYSTECTOMY  1964   COLONOSCOPY     Dr Nicolasa Ducking   DILATION AND CURETTAGE OF UTERUS     EYE SURGERY     LOOP RECORDER INSERTION N/A 02/09/2018   Procedure: LOOP RECORDER INSERTION;  Surgeon: Deboraha Sprang, MD;  Location: [place] CV LAB;  Service: Cardiovascular;  Laterality: N/A;   SKIN CANCER EXCISION     face   TEE WITHOUT CARDIOVERSION N/A 02/09/2018   Procedure: TRANSESOPHAGEAL ECHOCARDIOGRAM (TEE);  Surgeon: Minna Merritts, MD;  Location: [facility] ORS;  Service: Cardiovascular;  Laterality: N/A;    Family History  Problem Relation Age of Onset   Cancer Father        prostate   Stroke Father    Hypertension Sister    Hyperlipidemia Daughter    Heart disease Son    Diabetes Maternal Grandmother    Heart disease Maternal Grandmother    Heart disease Maternal Grandfather    Heart disease Daughter    CAD Mother     Failure to thrive Mother    Breast cancer Neg Hx     Social History   Socioeconomic History   Marital status: Widowed    Spouse name: Not on file   Number of children: Not on file   Years of education: Not on file   Highest education level: Not on file  Occupational History   Occupation: retired  Tobacco Use   Smoking status: Never   Smokeless tobacco: Never  Vaping Use   Vaping Use: Never used  Substance and Sexual Activity   Alcohol use: No    Alcohol/week: 0.0 standard drinks   Drug use: No   Sexual activity: Not Currently  Other Topics Concern   Not on file  Social History Narrative   Not on file   Social Determinants of Health   Financial Resource Strain: Low Risk    Difficulty of Paying Living Expenses: Not hard at all  Food Insecurity: No Food Insecurity   Worried About Charity fundraiser in the Last Year: Never true   [place] in the Last Year: Never true  Transportation Needs: No Transportation Needs   Lack of Transportation (Medical): No   Lack of Transportation (Non-Medical): No  Physical Activity: Insufficiently Active   Days of Exercise per Week: 3 days   Minutes of  Exercise per Session: 20 min  Stress: No Stress Concern Present   Feeling of Stress : Not at all  Social Connections: Not on file  Intimate Partner Violence: Not on file    Outpatient Medications Prior to Visit  Medication Sig Dispense Refill   acetaminophen (TYLENOL) 650 MG CR tablet Take 650 mg by mouth every 8 (eight) hours as needed for pain.     clopidogrel (PLAVIX) 75 MG tablet Take 1 tablet (75 mg total) by mouth daily. 90 tablet 1   esomeprazole (NEXIUM) 40 MG capsule Take 1 capsule (40 mg total) by mouth daily. 90 capsule 1   gabapentin (NEURONTIN) 100 MG capsule Take 1 capsule (100 mg total) by mouth 3 (three) times daily. 270 capsule 4   levothyroxine (SYNTHROID) 50 MCG tablet TAKE 1 TABLET(50 MCG) BY MOUTH AT BEDTIME 90 tablet 3   Multiple Vitamins-Minerals (VITAMIN  D3 COMPLETE PO) Take by mouth.     polyethylene glycol (MIRALAX / GLYCOLAX) 17 g packet Take 17 g by mouth daily.     PROLIA 60 MG/ML SOSY injection Inject 60 mg into the skin every 6 (six) months.     PSYLLIUM HUSK PO Take by mouth.     Red Yeast Rice Extract (RED YEAST RICE PO) Take 1 capsule by mouth daily.      diphenhydramine-acetaminophen (TYLENOL PM) 25-500 MG TABS tablet Take 1 tablet by mouth at bedtime as needed (for sleep).      Sennosides-Docusate Sodium (STOOL SOFTENER/LAXATIVE PO) Take by mouth.     No facility-administered medications prior to visit.    Allergies  Allergen Reactions   Statins Other (See Comments)    Leg cramps Leg cramps   Bacitracin    Neomycin    Neosporin + Pain Relief Max St  [Neomy-Bacit-Polymyx-Pramoxine]    Polymyxin B    Neomycin-Polymyxin-Gramicidin Other (See Comments)    Pt states wounds do not heal with neosporin Pt states wounds do not heal with neosporin    Review of Systems  Constitutional:  Positive for fatigue. Negative for fever.  Respiratory: Negative.    Cardiovascular: Negative.   Gastrointestinal:  Positive for abdominal pain (suprapubic pain). Negative for nausea and vomiting.  Genitourinary:  Positive for dysuria, frequency and urgency. Negative for flank pain and hematuria.  Musculoskeletal: Negative.   Neurological: Negative.       Objective:    Physical Exam Vitals and nursing note reviewed.  Constitutional:      General: She is not in acute distress.    Appearance: Normal appearance.  HENT:     Head: Normocephalic.  Eyes:     Conjunctiva/sclera: Conjunctivae normal.  Cardiovascular:     Rate and Rhythm: Normal rate and regular rhythm.     Pulses: Normal pulses.     Heart sounds: Normal heart sounds.  Pulmonary:     Effort: Pulmonary effort is normal.     Breath sounds: Normal breath sounds.  Abdominal:     Palpations: Abdomen is soft.     Tenderness: There is no abdominal tenderness. There is no right  CVA tenderness or left CVA tenderness.  Musculoskeletal:     Cervical back: Normal range of motion.  Skin:    General: Skin is warm.  Neurological:     General: No focal deficit present.     Mental Status: She is alert and oriented to person, place, and time.  Psychiatric:        Mood and Affect: Mood normal.          Behavior: Behavior normal.        Thought Content: Thought content normal.        Judgment: Judgment normal.    BP 109/65 (BP Location: Right Arm, Patient Position: Sitting)   Pulse 79   Temp 97.8 F (36.6 C) (Oral)   Resp 18   Wt 180 lb 12.8 oz (82 kg)   SpO2 96%   BMI 36.77 kg/m  Wt Readings from Last 3 Encounters:  10/26/21 180 lb 12.8 oz (82 kg)  08/25/21 182 lb (82.6 kg)  06/23/21 183 lb 6.4 oz (83.2 kg)    Health Maintenance Due  Topic Date Due   COVID-19 Vaccine (4 - Booster for Pfizer series) 10/24/2020   INFLUENZA VACCINE  06/29/2021    There are no preventive care reminders to display for this patient.   Lab Results  Component Value Date   TSH 2.250 06/23/2021   Lab Results  Component Value Date   WBC 7.1 06/23/2021   HGB 12.0 06/23/2021   HCT 35.7 06/23/2021   MCV 95 06/23/2021   PLT 324 06/23/2021   Lab Results  Component Value Date   NA 136 06/23/2021   K 4.6 06/23/2021   CO2 22 06/23/2021   GLUCOSE 93 06/23/2021   BUN 13 06/23/2021   CREATININE 0.81 06/23/2021   BILITOT 0.3 06/23/2021   ALKPHOS 42 (L) 06/23/2021   AST 15 06/23/2021   ALT 10 06/23/2021   PROT 6.2 06/23/2021   ALBUMIN 3.9 06/23/2021   CALCIUM 8.8 06/23/2021   ANIONGAP 10 01/19/2018   EGFR 70 06/23/2021   Lab Results  Component Value Date   CHOL 215 (H) 06/23/2021   Lab Results  Component Value Date   HDL 45 06/23/2021   Lab Results  Component Value Date   LDLCALC 130 (H) 06/23/2021   Lab Results  Component Value Date   TRIG 226 (H) 06/23/2021   Lab Results  Component Value Date   CHOLHDL 4.0 01/19/2018   Lab Results  Component Value Date    HGBA1C 6.1 (H) 01/18/2018       Assessment & Plan:   Problem List Items Addressed This Visit   None Visit Diagnoses     Dysuria    -  Primary   U/A positive for few bacteria, 3+ leukiocytes, and trace blood. Will treat for UTI.    Relevant Orders   Urinalysis, Routine w reflex microscopic   Acute cystitis without hematuria       Will treat with Bactrim x7days. Continue increasing fluids. Send for urine culture. F/U if symptoms not improving   Relevant Orders   Urine Culture        Meds ordered this encounter  Medications   sulfamethoxazole-trimethoprim (BACTRIM DS) 800-160 MG tablet    Sig: Take 1 tablet by mouth 2 (two) times daily.    Dispense:  14 tablet    Refill:  0      Charyl Dancer, NP

## 2021-10-26 NOTE — Telephone Encounter (Signed)
Reason for Disposition  Age > 50 years  Answer Assessment - Initial Assessment Questions 1. SEVERITY: "How bad is the pain?"  (e.g., Scale 1-10; mild, moderate, or severe)   - MILD (1-3): complains slightly about urination hurting   - MODERATE (4-7): interferes with normal activities     - SEVERE (8-10): excruciating, unwilling or unable to urinate because of the pain      Pt calling in c/o burning with urination and frequency.   No blood in urine 2. FREQUENCY: "How many times have you had painful urination today?"      I had UTIs before.    2 weeks ago it started burning with urination. 3. PATTERN: "Is pain present every time you urinate or just sometimes?"      Every time 4. ONSET: "When did the painful urination start?"      2 weeks ago 5. FEVER: "Do you have a fever?" If Yes, ask: "What is your temperature, how was it measured, and when did it start?"     No 6. PAST UTI: "Have you had a urine infection before?" If Yes, ask: "When was the last time?" and "What happened that time?"      Yes  I don't remember it's been over a year. 7. CAUSE: "What do you think is causing the painful urination?"  (e.g., UTI, scratch, Herpes sore)     UTI 8. OTHER SYMPTOMS: "Do you have any other symptoms?" (e.g., flank pain, vaginal discharge, genital sores, urgency, blood in urine)     No flank pain, or vaginal discharge, no blood, no itching 9. PREGNANCY: "Is there any chance you are pregnant?" "When was your last menstrual period?"     N/A due to age  Protocols used: Urination Pain - Female-A-AH

## 2021-10-26 NOTE — Telephone Encounter (Signed)
Patient seen today in office by Lauren.

## 2021-10-26 NOTE — Telephone Encounter (Signed)
Pt called in c/o urinary symptoms.   See triage notes  Appt made for today with Vance Peper, NP for 9:40 in office visit.

## 2021-10-27 LAB — URINALYSIS, ROUTINE W REFLEX MICROSCOPIC
Bilirubin, UA: NEGATIVE
Glucose, UA: NEGATIVE
Ketones, UA: NEGATIVE
Nitrite, UA: NEGATIVE
Specific Gravity, UA: 1.015 (ref 1.005–1.030)
Urobilinogen, Ur: 1 mg/dL (ref 0.2–1.0)
pH, UA: 7 (ref 5.0–7.5)

## 2021-10-27 LAB — MICROSCOPIC EXAMINATION

## 2021-10-28 LAB — URINE CULTURE

## 2021-11-02 ENCOUNTER — Ambulatory Visit (INDEPENDENT_AMBULATORY_CARE_PROVIDER_SITE_OTHER): Payer: Medicare Other | Admitting: Urology

## 2021-11-02 ENCOUNTER — Encounter: Payer: Self-pay | Admitting: Urology

## 2021-11-02 ENCOUNTER — Other Ambulatory Visit: Payer: Self-pay

## 2021-11-02 VITALS — BP 138/84 | HR 88 | Ht 58.8 in | Wt 182.0 lb

## 2021-11-02 DIAGNOSIS — N3020 Other chronic cystitis without hematuria: Secondary | ICD-10-CM

## 2021-11-02 LAB — URINALYSIS, COMPLETE
Bilirubin, UA: NEGATIVE
Glucose, UA: NEGATIVE
Leukocytes,UA: NEGATIVE
Nitrite, UA: NEGATIVE
Protein,UA: NEGATIVE
RBC, UA: NEGATIVE
Specific Gravity, UA: 1.025 (ref 1.005–1.030)
Urobilinogen, Ur: 0.2 mg/dL (ref 0.2–1.0)
pH, UA: 5.5 (ref 5.0–7.5)

## 2021-11-02 LAB — MICROSCOPIC EXAMINATION

## 2021-11-02 MED ORDER — NITROFURANTOIN MONOHYD MACRO 100 MG PO CAPS
100.0000 mg | ORAL_CAPSULE | Freq: Every day | ORAL | 3 refills | Status: DC
Start: 2021-11-02 — End: 2022-04-30

## 2021-11-02 NOTE — Progress Notes (Signed)
11/02/2021 9:37 AM   Sarah Freeman [place] 1931/02/05 [id number]  Referring provider: Valerie Roys, DO [place],  [place] [id number]  Chief Complaint  Patient presents with   Cystitis    HPI: I  was consulted to assess the patient's worsening urinary incontinence over many months. She leaks with coughing sneezing bending and lifting.  She has urge incontinence and no bedwetting.  She wears 1 pad a day.  She will wear 2 pads if active.  She soaks more urine if she has urgency.   She voids 4 or 5 times at night.  She voids every 2 hours during the day.     Myrbetriq did not help.   Patient had a positive urine culture in January and February 2019.  Recent renal ultrasound was normal The patient was started on trimethoprim and was also given Vesicare.   When I saw her last time she was infection free and did not want to go back on Vesicare for persisting urge incontinence.  Overall it was improved.  she has been having cardiac issues   The history was a little bit difficult bladder and the patient is on daily trimethoprim but twice in the last year has had bladder infections treated with dysuria and achiness.  It looks like she is currently on Macrodantin for infection.  Frequency is stable.  Urge incontinence stable   I could not find any cultures in the medical record in the last many month    Patient may or may not be having breakthrough infections but her symptoms do sound like cystitis that generally respond to an antibiotic.  I thought it was reasonable to change her to daily Macrodantin and have her come back in 4 months and check on her.  I would call her if the urine culture today is positive  Today Frequency stable.  When I saw the patient in August 2021 her culture was negative.  She just finished Bactrim but her culture from October 26, 2021 was negative.  She was having suprapubic discomfort that got better on Bactrim.  She understands if culture is negative.   Clinically not infected today but urine sent for culture      PMH: Past Medical History:  Diagnosis Date   Hyperlipidemia    Hypothyroid    Stroke [facility])    Urinary incontinence     Surgical History: Past Surgical History:  Procedure Laterality Date   ABDOMINAL HYSTERECTOMY     age 60   BREAST BIOPSY Left 2000?   neg. dr. Dwyane Luo office   CHOLECYSTECTOMY  1964   COLONOSCOPY     Dr Nicolasa Ducking   DILATION AND CURETTAGE OF UTERUS     EYE SURGERY     LOOP RECORDER INSERTION N/A 02/09/2018   Procedure: LOOP RECORDER INSERTION;  Surgeon: Deboraha Sprang, MD;  Location: [facility];  Service: Cardiovascular;  Laterality: N/A;   SKIN CANCER EXCISION     face   TEE WITHOUT CARDIOVERSION N/A 02/09/2018   Procedure: TRANSESOPHAGEAL ECHOCARDIOGRAM (TEE);  Surgeon: Minna Merritts, MD;  Location: [facility];  Service: Cardiovascular;  Laterality: N/A;    Home Medications:  Allergies as of 11/02/2021       Reactions   Statins Other (See Comments)   Leg cramps Leg cramps   Bacitracin    Neomycin    Neosporin + Pain Relief Max St  [neomy-bacit-polymyx-pramoxine]    Polymyxin B    Neomycin-polymyxin-gramicidin Other (See Comments)  Pt states wounds do not heal with neosporin Pt states wounds do not heal with neosporin        Medication List        Accurate as of November 02, 2021  9:37 AM. If you have any questions, ask your nurse or doctor.          STOP taking these medications    diphenhydramine-acetaminophen 25-500 MG Tabs tablet Commonly known as: TYLENOL PM Stopped by: Reece Packer, MD   STOOL SOFTENER/LAXATIVE PO Stopped by: Reece Packer, MD   sulfamethoxazole-trimethoprim 800-160 MG tablet Commonly known as: BACTRIM DS Stopped by: Reece Packer, MD       TAKE these medications    acetaminophen 650 MG CR tablet Commonly known as: TYLENOL Take 650 mg by mouth every 8 (eight) hours as needed for pain.   clopidogrel 75 MG  tablet Commonly known as: PLAVIX Take 1 tablet (75 mg total) by mouth daily.   esomeprazole 40 MG capsule Commonly known as: NEXIUM Take 1 capsule (40 mg total) by mouth daily.   gabapentin 100 MG capsule Commonly known as: NEURONTIN Take 1 capsule (100 mg total) by mouth 3 (three) times daily.   levothyroxine 50 MCG tablet Commonly known as: Synthroid TAKE 1 TABLET(50 MCG) BY MOUTH AT BEDTIME   nitrofurantoin (macrocrystal-monohydrate) 100 MG capsule Commonly known as: MACROBID Take 100 mg by mouth daily.   polyethylene glycol 17 g packet Commonly known as: MIRALAX / GLYCOLAX Take 17 g by mouth daily.   Prolia 60 MG/ML Sosy injection Generic drug: denosumab Inject 60 mg into the skin every 6 (six) months.   PSYLLIUM HUSK PO Take by mouth.   RED YEAST RICE PO Take 1 capsule by mouth daily.   VITAMIN D3 COMPLETE PO Take by mouth.        Allergies:  Allergies  Allergen Reactions   Statins Other (See Comments)    Leg cramps Leg cramps   Bacitracin    Neomycin    Neosporin + Pain Relief Max St  [Neomy-Bacit-Polymyx-Pramoxine]    Polymyxin B    Neomycin-Polymyxin-Gramicidin Other (See Comments)    Pt states wounds do not heal with neosporin Pt states wounds do not heal with neosporin    Family History: Family History  Problem Relation Age of Onset   Cancer Father        prostate   Stroke Father    Hypertension Sister    Hyperlipidemia Daughter    Heart disease Son    Diabetes Maternal Grandmother    Heart disease Maternal Grandmother    Heart disease Maternal Grandfather    Heart disease Daughter    CAD Mother    Failure to thrive Mother    Breast cancer Neg Hx     Social History:  reports that she has never smoked. She has never used smokeless tobacco. She reports that she does not drink alcohol and does not use drugs.  ROS:                                        Physical Exam: BP 138/84   Pulse 88   Ht 4' 10.8"  (1.494 m)   Wt 82.6 kg   BMI 37.01 kg/m   Constitutional:  Alert and oriented, No acute distress. HEENT: [place] AT, moist mucus membranes.  Trachea midline, no masses.  Laboratory Data: Lab Results  Component Value Date   WBC 7.1 06/23/2021   HGB 12.0 06/23/2021   HCT 35.7 06/23/2021   MCV 95 06/23/2021   PLT 324 06/23/2021    Lab Results  Component Value Date   CREATININE 0.81 06/23/2021    No results found for: PSA  No results found for: TESTOSTERONE  Lab Results  Component Value Date   HGBA1C 6.1 (H) 01/18/2018    Urinalysis    Component Value Date/Time   COLORURINE YELLOW (A) 01/18/2018 1925   APPEARANCEUR Cloudy (A) 10/26/2021 0943   LABSPEC 1.006 01/18/2018 1925   PHURINE 7.0 01/18/2018 1925   GLUCOSEU Negative 10/26/2021 0943   HGBUR NEGATIVE 01/18/2018 1925   BILIRUBINUR Negative 10/26/2021 [place] 01/18/2018 1925   PROTEINUR Trace (A) 10/26/2021 0943   PROTEINUR NEGATIVE 01/18/2018 1925   NITRITE Negative 10/26/2021 0943   NITRITE NEGATIVE 01/18/2018 1925   LEUKOCYTESUR 3+ (A) 10/26/2021 0943    Pertinent Imaging:   Assessment & Plan: Overall doing well.  Patient probably did not have a true breakthrough infection.  90x3 Macrodantin sent to pharmacy and I will see in 1 year  There are no diagnoses linked to this encounter.  No follow-ups on file.  Reece Packer, MD  [facility] [address] [place] [address], [place], [place] [address] [phone]

## 2021-11-06 LAB — CULTURE, URINE COMPREHENSIVE

## 2021-11-09 ENCOUNTER — Ambulatory Visit: Payer: Self-pay | Admitting: Urology

## 2021-12-08 ENCOUNTER — Other Ambulatory Visit: Payer: Self-pay | Admitting: Nurse Practitioner

## 2021-12-08 NOTE — Telephone Encounter (Signed)
Requested Prescriptions  Pending Prescriptions Disp Refills   gabapentin (NEURONTIN) 100 MG capsule [Pharmacy Med Name: GABAPENTIN 100MG  CAPSULES] 270 capsule 4    Sig: TAKE 1 CAPSULE(100 MG) BY MOUTH THREE TIMES DAILY     Neurology: Anticonvulsants - gabapentin Passed - 12/08/2021  7:14 AM      Passed - Valid encounter within last 12 months    Recent Outpatient Visits          1 month ago [place], Lauren A, NP   3 months ago Dizziness   [place], Lauren A, NP   5 months ago Constipation, unspecified constipation type   [place], [place], DO   1 year ago Essential hypertension   [place], [place], DO   1 year ago Pre-op examination   [place], Barb Merino, DO      Future Appointments            In 3 weeks Wynetta Emery, Barb Merino, DO [place], [place]   In 74 month  [employer], [place]   In 80 months MacDiarmid, Nicki Reaper, [place]

## 2021-12-29 ENCOUNTER — Ambulatory Visit (INDEPENDENT_AMBULATORY_CARE_PROVIDER_SITE_OTHER): Payer: Medicare Other | Admitting: Family Medicine

## 2021-12-29 ENCOUNTER — Encounter: Payer: Self-pay | Admitting: Family Medicine

## 2021-12-29 ENCOUNTER — Other Ambulatory Visit: Payer: Self-pay

## 2021-12-29 VITALS — BP 133/80 | HR 88 | Temp 98.2°F | Ht <= 58 in | Wt 184.4 lb

## 2021-12-29 DIAGNOSIS — I700 Atherosclerosis of aorta: Secondary | ICD-10-CM | POA: Diagnosis not present

## 2021-12-29 DIAGNOSIS — K219 Gastro-esophageal reflux disease without esophagitis: Secondary | ICD-10-CM

## 2021-12-29 DIAGNOSIS — E7800 Pure hypercholesterolemia, unspecified: Secondary | ICD-10-CM

## 2021-12-29 DIAGNOSIS — Z0000 Encounter for general adult medical examination without abnormal findings: Secondary | ICD-10-CM

## 2021-12-29 DIAGNOSIS — E039 Hypothyroidism, unspecified: Secondary | ICD-10-CM | POA: Diagnosis not present

## 2021-12-29 DIAGNOSIS — I10 Essential (primary) hypertension: Secondary | ICD-10-CM

## 2021-12-29 DIAGNOSIS — R413 Other amnesia: Secondary | ICD-10-CM

## 2021-12-29 DIAGNOSIS — N183 Chronic kidney disease, stage 3 unspecified: Secondary | ICD-10-CM

## 2021-12-29 LAB — MICROSCOPIC EXAMINATION: RBC, Urine: NONE SEEN /hpf (ref 0–2)

## 2021-12-29 LAB — URINALYSIS, ROUTINE W REFLEX MICROSCOPIC
Bilirubin, UA: NEGATIVE
Glucose, UA: NEGATIVE
Ketones, UA: NEGATIVE
Nitrite, UA: NEGATIVE
Protein,UA: NEGATIVE
RBC, UA: NEGATIVE
Specific Gravity, UA: 1.015 (ref 1.005–1.030)
Urobilinogen, Ur: 0.2 mg/dL (ref 0.2–1.0)
pH, UA: 5.5 (ref 5.0–7.5)

## 2021-12-29 LAB — MICROALBUMIN, URINE WAIVED
Creatinine, Urine Waived: 100 mg/dL (ref 10–300)
Microalb, Ur Waived: 10 mg/L (ref 0–19)
Microalb/Creat Ratio: <30 mg/g (ref <30)

## 2021-12-29 MED ORDER — GABAPENTIN 100 MG PO CAPS: ORAL_CAPSULE | ORAL | 1 refills | Status: DC

## 2021-12-29 MED ORDER — CLOPIDOGREL BISULFATE 75 MG PO TABS: 75.0000 mg | ORAL_TABLET | Freq: Every day | ORAL | 1 refills | Status: DC

## 2021-12-29 MED ORDER — ESOMEPRAZOLE MAGNESIUM 40 MG PO CPDR: 40.0000 mg | DELAYED_RELEASE_CAPSULE | Freq: Every day | ORAL | 1 refills | Status: DC

## 2021-12-29 NOTE — Progress Notes (Signed)
BP 133/80    Pulse 88    Temp 98.2 F (36.8 C)    Ht _0  (1.448 m)    Wt 184 lb 6.4 oz (83.6 kg)    SpO2 96%    BMI 39.90 kg/m    Subjective:    Patient ID: Sarah Freeman, female    DOB: 1931-06-05, 88 y.o.   MRN: [id number]  HPI: Sarah Freeman is a 88 y.o. female presenting on 12/29/2021 for comprehensive medical examination. Current medical complaints include:  HYPERTENSION / HYPERLIPIDEMIA Satisfied with current treatment? yes Duration of hypertension: chronic BP medication side effects: not on anything now Duration of hyperlipidemia: chronic Cholesterol medication side effects: yes Cholesterol supplements: red yeast rice Past cholesterol medications: none Medication compliance: not on anything Aspirin: no Recent stressors: no Recurrent headaches: no Visual changes: no Palpitations: no Dyspnea: no Chest pain: no Lower extremity edema: no Dizzy/lightheaded: no  HYPOTHYROIDISM Thyroid control status:controlled Satisfied with current treatment? no Medication side effects: no Medication compliance: excellent compliance Etiology of hypothyroidism:  Recent dose adjustment:no Fatigue: no Cold intolerance: no Heat intolerance: no Weight gain: no Weight loss: no Constipation: no Diarrhea/loose stools: no Palpitations: no Lower extremity edema: no Anxiety/depressed mood: no  Menopausal Symptoms: no  Depression Screen done today and results listed below:  Depression screen [facility] 2/9 12/29/2021 01/16/2021 11/12/2019 11/12/2019 10/11/2018  Decreased Interest 0 0 0 0 0  Down, Depressed, Hopeless 0 0 0 0 0  PHQ - 2 Score 0 0 0 0 0  Altered sleeping 0 - 0 - 0  Tired, decreased energy 0 - 0 - 0  Change in appetite 0 - 0 - 0  Feeling bad or failure about yourself  0 - 0 - 0  Trouble concentrating 0 - 0 - 0  Moving slowly or fidgety/restless 0 - 0 - 0  Suicidal thoughts 0 - 0 - 0  PHQ-9 Score 0 - 0 - 0  Difficult doing work/chores - - Not difficult at all - Not  difficult at all    Past Medical History:  Past Medical History:  Diagnosis Date   Hyperlipidemia    Hypothyroid    Stroke [facility])    Urinary incontinence     Surgical History:  Past Surgical History:  Procedure Laterality Date   BREAST BIOPSY Left 2000?   neg. dr. Dwyane Luo office   CHOLECYSTECTOMY  1964   COLONOSCOPY     Dr Nicolasa Ducking   DILATION AND CURETTAGE OF UTERUS     EYE SURGERY     LOOP RECORDER INSERTION N/A 02/09/2018   Procedure: LOOP RECORDER INSERTION;  Surgeon: Deboraha Sprang, MD;  Location: [facility];  Service: Cardiovascular;  Laterality: N/A;   SKIN CANCER EXCISION     face   TEE WITHOUT CARDIOVERSION N/A 02/09/2018   Procedure: TRANSESOPHAGEAL ECHOCARDIOGRAM (TEE);  Surgeon: Minna Merritts, MD;  Location: [facility];  Service: Cardiovascular;  Laterality: N/A;   TOTAL ABDOMINAL HYSTERECTOMY W/ BILATERAL SALPINGOOPHORECTOMY     age 6    Medications:  Current Outpatient Medications on File Prior to Visit  Medication Sig   acetaminophen (TYLENOL) 650 MG CR tablet Take 650 mg by mouth every 8 (eight) hours as needed for pain.   levothyroxine (SYNTHROID) 50 MCG tablet TAKE 1 TABLET(50 MCG) BY MOUTH AT BEDTIME   Multiple Vitamins-Minerals (VITAMIN D3 COMPLETE PO) Take by mouth.   nitrofurantoin, macrocrystal-monohydrate, (MACROBID) 100 MG capsule Take 1 capsule (100 mg total) by mouth daily.  polyethylene glycol (MIRALAX / GLYCOLAX) 17 g packet Take 17 g by mouth daily.   PROLIA 60 MG/ML SOSY injection Inject 60 mg into the skin every 6 (six) months.   PSYLLIUM HUSK PO Take by mouth.   Red Yeast Rice Extract (RED YEAST RICE PO) Take 1 capsule by mouth daily.    No current facility-administered medications on file prior to visit.    Allergies:  Allergies  Allergen Reactions   Statins Other (See Comments)    Leg cramps Leg cramps   Bacitracin    Neomycin    Neosporin + Pain Relief Max St  [Neomy-Bacit-Polymyx-Pramoxine]    Polymyxin B     Neomycin-Polymyxin-Gramicidin Other (See Comments)    Pt states wounds do not heal with neosporin Pt states wounds do not heal with neosporin    Social History:  Social History   Socioeconomic History   Marital status: Widowed    Spouse name: Not on file   Number of children: Not on file   Years of education: Not on file   Highest education level: Not on file  Occupational History   Occupation: retired  Tobacco Use   Smoking status: Never   Smokeless tobacco: Never  Vaping Use   Vaping Use: Never used  Substance and Sexual Activity   Alcohol use: No    Alcohol/week: 0.0 standard drinks   Drug use: No   Sexual activity: Not Currently  Other Topics Concern   Not on file  Social History Narrative   Not on file   Social Determinants of Health   Financial Resource Strain: Low Risk    Difficulty of Paying Living Expenses: Not hard at all  Food Insecurity: No Food Insecurity   Worried About Charity fundraiser in the Last Year: Never true   [place] in the Last Year: Never true  Transportation Needs: No Transportation Needs   Lack of Transportation (Medical): No   Lack of Transportation (Non-Medical): No  Physical Activity: Insufficiently Active   Days of Exercise per Week: 3 days   Minutes of Exercise per Session: 20 min  Stress: No Stress Concern Present   Feeling of Stress : Not at all  Social Connections: Not on file  Intimate Partner Violence: Not on file   Social History   Tobacco Use  Smoking Status Never  Smokeless Tobacco Never   Social History   Substance and Sexual Activity  Alcohol Use No   Alcohol/week: 0.0 standard drinks    Family History:  Family History  Problem Relation Age of Onset   Cancer Father        prostate   Stroke Father    Hypertension Sister    Hyperlipidemia Daughter    Heart disease Son    Diabetes Maternal Grandmother    Heart disease Maternal Grandmother    Heart disease Maternal Grandfather    Heart disease  Daughter    CAD Mother    Failure to thrive Mother    Breast cancer Neg Hx     Past medical history, surgical history, medications, allergies, family history and social history reviewed with patient today and changes made to appropriate areas of the chart.   Review of Systems  Constitutional: Negative.   HENT: Negative.    Eyes: Negative.   Respiratory: Negative.    Cardiovascular: Negative.   Gastrointestinal:  Positive for constipation. Negative for abdominal pain, blood in stool, diarrhea, heartburn, melena, nausea and vomiting.  Genitourinary: Negative.   Musculoskeletal:  Negative.   Skin: Negative.   Neurological: Negative.   Endo/Heme/Allergies: Negative.   Psychiatric/Behavioral:  Positive for memory loss. Negative for depression, hallucinations, substance abuse and suicidal ideas. The patient is not nervous/anxious and does not have insomnia.   All other ROS negative except what is listed above and in the HPI.      Objective:    BP 133/80    Pulse 88    Temp 98.2 F (36.8 C)    Ht _0  (1.448 m)    Wt 184 lb 6.4 oz (83.6 kg)    SpO2 96%    BMI 39.90 kg/m   Wt Readings from Last 3 Encounters:  12/29/21 184 lb 6.4 oz (83.6 kg)  11/02/21 182 lb (82.6 kg)  10/26/21 180 lb 12.8 oz (82 kg)    Physical Exam Vitals and nursing note reviewed.  Constitutional:      General: She is not in acute distress.    Appearance: Normal appearance. She is not ill-appearing, toxic-appearing or diaphoretic.  HENT:     Head: Normocephalic and atraumatic.     Right Ear: Tympanic membrane, ear canal and external ear normal. There is no impacted cerumen.     Left Ear: Tympanic membrane, ear canal and external ear normal. There is no impacted cerumen.     Nose: Nose normal. No congestion or rhinorrhea.     Mouth/Throat:     Mouth: Mucous membranes are moist.     Pharynx: Oropharynx is clear. No oropharyngeal exudate or posterior oropharyngeal erythema.  Eyes:     General: No scleral  icterus.       Right eye: No discharge.        Left eye: No discharge.     Extraocular Movements: Extraocular movements intact.     Conjunctiva/sclera: Conjunctivae normal.     Pupils: Pupils are equal, round, and reactive to light.  Neck:     Vascular: No carotid bruit.  Cardiovascular:     Rate and Rhythm: Normal rate and regular rhythm.     Pulses: Normal pulses.     Heart sounds: No murmur heard.   No friction rub. No gallop.  Pulmonary:     Effort: Pulmonary effort is normal. No respiratory distress.     Breath sounds: Normal breath sounds. No stridor. No wheezing, rhonchi or rales.  Chest:     Chest wall: No tenderness.  Abdominal:     General: Abdomen is flat. Bowel sounds are normal. There is no distension.     Palpations: Abdomen is soft. There is no mass.     Tenderness: There is no abdominal tenderness. There is no right CVA tenderness, left CVA tenderness, guarding or rebound.     Hernia: No hernia is present.  Genitourinary:    Comments: Breast and pelvic exams deferred with shared decision making Musculoskeletal:        General: No swelling, tenderness, deformity or signs of injury.     Cervical back: Normal range of motion and neck supple. No rigidity. No muscular tenderness.     Right lower leg: No edema.     Left lower leg: No edema.  Lymphadenopathy:     Cervical: No cervical adenopathy.  Skin:    General: Skin is warm and dry.     Capillary Refill: Capillary refill takes less than 2 seconds.     Coloration: Skin is not jaundiced or pale.     Findings: No bruising, erythema, lesion or rash.  Neurological:  General: No focal deficit present.     Mental Status: She is alert and oriented to person, place, and time. Mental status is at baseline.     Cranial Nerves: No cranial nerve deficit.     Sensory: No sensory deficit.     Motor: No weakness.     Coordination: Coordination normal.     Gait: Gait normal.     Deep Tendon Reflexes: Reflexes normal.   Psychiatric:        Mood and Affect: Mood normal.        Behavior: Behavior normal.        Thought Content: Thought content normal.        Judgment: Judgment normal.    Results for orders placed or performed in visit on 12/29/21  Microscopic Examination   Urine  Result Value Ref Range   WBC, UA 0-5 0 - 5 /hpf   RBC None seen 0 - 2 /hpf   Epithelial Cells (non renal) 0-10 0 - 10 /hpf   Bacteria, UA Few (A) None seen/Few  CBC with Differential/Platelet  Result Value Ref Range   WBC 7.4 3.4 - 10.8 x10E3/uL   RBC 3.84 3.77 - 5.28 x10E6/uL   Hemoglobin 12.8 11.1 - 15.9 g/dL   Hematocrit 37.1 34.0 - 46.6 %   MCV 97 79 - 97 fL   MCH 33.3 (H) 26.6 - 33.0 pg   MCHC 34.5 31.5 - 35.7 g/dL   RDW 12.5 11.7 - 15.4 %   Platelets 310 150 - 450 x10E3/uL   Neutrophils 48 Not Estab. %   Lymphs 39 Not Estab. %   Monocytes 9 Not Estab. %   Eos 3 Not Estab. %   Basos 1 Not Estab. %   Neutrophils Absolute 3.5 1.4 - 7.0 x10E3/uL   Lymphocytes Absolute 2.9 0.7 - 3.1 x10E3/uL   Monocytes Absolute 0.7 0.1 - 0.9 x10E3/uL   EOS (ABSOLUTE) 0.2 0.0 - 0.4 x10E3/uL   Basophils Absolute 0.1 0.0 - 0.2 x10E3/uL   Immature Granulocytes 0 Not Estab. %   Immature Grans (Abs) 0.0 0.0 - 0.1 x10E3/uL  Comprehensive metabolic panel  Result Value Ref Range   Glucose 114 (H) 70 - 99 mg/dL   BUN 12 8 - 27 mg/dL   Creatinine, Ser 0.91 0.57 - 1.00 mg/dL   eGFR 61 >59 mL/min/1.73   BUN/Creatinine Ratio 13 12 - 28   Sodium 138 134 - 144 mmol/L   Potassium 4.3 3.5 - 5.2 mmol/L   Chloride 102 96 - 106 mmol/L   CO2 23 20 - 29 mmol/L   Calcium 9.1 8.7 - 10.3 mg/dL   Total Protein 6.7 6.0 - 8.5 g/dL   Albumin 4.1 3.6 - 4.6 g/dL   Globulin, Total 2.6 1.5 - 4.5 g/dL   Albumin/Globulin Ratio 1.6 1.2 - 2.2   Bilirubin Total 0.3 0.0 - 1.2 mg/dL   Alkaline Phosphatase 46 44 - 121 IU/L   AST 19 0 - 40 IU/L   ALT 12 0 - 32 IU/L  Lipid Panel w/o Chol/HDL Ratio  Result Value Ref Range   Cholesterol, Total 233 (H) 100 -  199 mg/dL   Triglycerides 230 (H) 0 - 149 mg/dL   HDL 53 >39 mg/dL   VLDL Cholesterol Cal 41 (H) 5 - 40 mg/dL   LDL Chol Calc (NIH) 139 (H) 0 - 99 mg/dL  Urinalysis, Routine w reflex microscopic  Result Value Ref Range   Specific Gravity, UA 1.015 1.005 - 1.030  pH, UA 5.5 5.0 - 7.5   Color, UA Yellow Yellow   Appearance Ur Clear Clear   Leukocytes,UA Trace (A) Negative   Protein,UA Negative Negative/Trace   Glucose, UA Negative Negative   Ketones, UA Negative Negative   RBC, UA Negative Negative   Bilirubin, UA Negative Negative   Urobilinogen, Ur 0.2 0.2 - 1.0 mg/dL   Nitrite, UA Negative Negative   Microscopic Examination See below:   TSH  Result Value Ref Range   TSH 3.550 0.450 - 4.500 uIU/mL  Microalbumin, Urine Waived  Result Value Ref Range   Microalb, Ur Waived 10 0 - 19 mg/L   Creatinine, Urine Waived 100 10 - 300 mg/dL   Microalb/Creat Ratio <30 <30 mg/g      Assessment & Plan:   Problem List Items Addressed This Visit       Cardiovascular and Mediastinum   HTN (hypertension)    Doing well off medicine. Continue current regimen. Continue to monitor. Call with any concerns. Labs drawn today.      Relevant Orders   CBC with Differential/Platelet (Completed)   Comprehensive metabolic panel (Completed)   Urinalysis, Routine w reflex microscopic (Completed)   Microalbumin, Urine Waived (Completed)   Aortic atherosclerosis (HCC)    Will keep BP and cholesterol under good control. Continue to monitor. Call with any concerns.       Relevant Orders   CBC with Differential/Platelet (Completed)   Comprehensive metabolic panel (Completed)   Lipid Panel w/o Chol/HDL Ratio (Completed)     Digestive   GERD (gastroesophageal reflux disease)    Under good control on current regimen. Continue current regimen. Continue to monitor. Call with any concerns. Refills given. Labs drawn today.       Relevant Medications   esomeprazole (NEXIUM) 40 MG capsule   Other  Relevant Orders   CBC with Differential/Platelet (Completed)   Comprehensive metabolic panel (Completed)     Endocrine   Hypothyroidism    Rechecking labs today. Await results. Treat as needed.       Relevant Orders   CBC with Differential/Platelet (Completed)   Comprehensive metabolic panel (Completed)   TSH (Completed)     Genitourinary   Chronic kidney disease, stage 3 ([place])    Rechecking labs today. Await results. Treat as needed.       Relevant Orders   CBC with Differential/Platelet (Completed)   Comprehensive metabolic panel (Completed)   Microalbumin, Urine Waived (Completed)     Other   Hypercholesterolemia    Doing well off medicine. Continue current regimen. Continue to monitor. Call with any concerns. Labs drawn today.      Relevant Orders   CBC with Differential/Platelet (Completed)   Comprehensive metabolic panel (Completed)   Lipid Panel w/o Chol/HDL Ratio (Completed)   Morbid obesity (HCC)    Stable. Continue to monitor. Encouraged diet and exercise. Continue to monitor.       Memory loss    Daughter notes that they are noticing some issues. She refuses any treatment or that there is a problem- will await 6CIT on with medicare wellness. Checking labs today. Continue to monitor.       Other Visit Diagnoses     Routine general medical examination at a health care facility    -  Primary   Vaccines up to date. Screening labs checked today. DEXA up to date. Continue diet and exercise. Call with any concerns.         Follow up plan: Return in about  6 months (around 06/28/2022), or Records release for bone density from [facility].   LABORATORY TESTING:  - Pap smear: not applicable  IMMUNIZATIONS:   - Tdap: Tetanus vaccination status reviewed: last tetanus booster within 10 years. - Influenza: Up to date - Pneumovax: Up to date - Prevnar: Up to date - COVID: Up to date - HPV: Not applicable - Shingrix vaccine: Up to  date  SCREENING: -Mammogram: Not applicable  - Colonoscopy: Not applicable  - Bone Density: up to date    NEXT PREVENTATIVE PHYSICAL DUE IN 1 YEAR. Return in about 6 months (around 06/28/2022), or Records release for bone density from [facility].

## 2021-12-30 LAB — CBC WITH DIFFERENTIAL/PLATELET
Basophils Absolute: 0.1 10*3/uL (ref 0.0–0.2)
Basos: 1 %
EOS (ABSOLUTE): 0.2 10*3/uL (ref 0.0–0.4)
Eos: 3 %
Hematocrit: 37.1 % (ref 34.0–46.6)
Hemoglobin: 12.8 g/dL (ref 11.1–15.9)
Immature Grans (Abs): 0 10*3/uL (ref 0.0–0.1)
Immature Granulocytes: 0 %
Lymphocytes Absolute: 2.9 10*3/uL (ref 0.7–3.1)
Lymphs: 39 %
MCH: 33.3 pg — ABNORMAL HIGH (ref 26.6–33.0)
MCHC: 34.5 g/dL (ref 31.5–35.7)
MCV: 97 fL (ref 79–97)
Monocytes Absolute: 0.7 10*3/uL (ref 0.1–0.9)
Monocytes: 9 %
Neutrophils Absolute: 3.5 10*3/uL (ref 1.4–7.0)
Neutrophils: 48 %
Platelets: 310 10*3/uL (ref 150–450)
RBC: 3.84 x10E6/uL (ref 3.77–5.28)
RDW: 12.5 % (ref 11.7–15.4)
WBC: 7.4 10*3/uL (ref 3.4–10.8)

## 2021-12-30 LAB — COMPREHENSIVE METABOLIC PANEL
ALT: 12 IU/L (ref 0–32)
AST: 19 IU/L (ref 0–40)
Albumin/Globulin Ratio: 1.6 (ref 1.2–2.2)
Albumin: 4.1 g/dL (ref 3.6–4.6)
Alkaline Phosphatase: 46 IU/L (ref 44–121)
BUN/Creatinine Ratio: 13 (ref 12–28)
BUN: 12 mg/dL (ref 8–27)
Bilirubin Total: 0.3 mg/dL (ref 0.0–1.2)
CO2: 23 mmol/L (ref 20–29)
Calcium: 9.1 mg/dL (ref 8.7–10.3)
Chloride: 102 mmol/L (ref 96–106)
Creatinine, Ser: 0.91 mg/dL (ref 0.57–1.00)
Globulin, Total: 2.6 g/dL (ref 1.5–4.5)
Glucose: 114 mg/dL — ABNORMAL HIGH (ref 70–99)
Potassium: 4.3 mmol/L (ref 3.5–5.2)
Sodium: 138 mmol/L (ref 134–144)
Total Protein: 6.7 g/dL (ref 6.0–8.5)
eGFR: 61 mL/min/{1.73_m2} (ref >59)

## 2021-12-30 LAB — LIPID PANEL W/O CHOL/HDL RATIO
Cholesterol, Total: 233 mg/dL — ABNORMAL HIGH (ref 100–199)
HDL: 53 mg/dL (ref >39)
LDL Chol Calc (NIH): 139 mg/dL — ABNORMAL HIGH (ref 0–99)
Triglycerides: 230 mg/dL — ABNORMAL HIGH (ref 0–149)
VLDL Cholesterol Cal: 41 mg/dL — ABNORMAL HIGH (ref 5–40)

## 2021-12-30 LAB — TSH: TSH: 3.55 u[IU]/mL (ref 0.450–4.500)

## 2021-12-31 ENCOUNTER — Encounter: Payer: Self-pay | Admitting: Family Medicine

## 2021-12-31 ENCOUNTER — Other Ambulatory Visit: Payer: Self-pay | Admitting: Family Medicine

## 2022-01-01 ENCOUNTER — Encounter: Payer: Self-pay | Admitting: Family Medicine

## 2022-01-01 NOTE — Assessment & Plan Note (Signed)
Doing well off medicine. Continue current regimen. Continue to monitor. Call with any concerns. Labs drawn today.

## 2022-01-01 NOTE — Assessment & Plan Note (Signed)
Stable. Continue to monitor. Encouraged diet and exercise. Continue to monitor.

## 2022-01-01 NOTE — Assessment & Plan Note (Signed)
Will keep BP and cholesterol under good control. Continue to monitor. Call with any concerns.  

## 2022-01-01 NOTE — Assessment & Plan Note (Signed)
Rechecking labs today. Await results. Treat as needed.  °

## 2022-01-01 NOTE — Assessment & Plan Note (Signed)
Under good control on current regimen. Continue current regimen. Continue to monitor. Call with any concerns. Refills given. Labs drawn today.   

## 2022-01-01 NOTE — Assessment & Plan Note (Signed)
Daughter notes that they are noticing some issues. She refuses any treatment or that there is a problem- will await 6CIT on with medicare wellness. Checking labs today. Continue to monitor.

## 2022-01-18 ENCOUNTER — Ambulatory Visit (INDEPENDENT_AMBULATORY_CARE_PROVIDER_SITE_OTHER): Payer: Medicare Other | Admitting: *Deleted

## 2022-01-18 ENCOUNTER — Encounter: Payer: Self-pay | Admitting: Family Medicine

## 2022-01-18 ENCOUNTER — Ambulatory Visit: Payer: Medicare Other

## 2022-01-18 DIAGNOSIS — Z0000 Encounter for general adult medical examination without abnormal findings: Secondary | ICD-10-CM | POA: Diagnosis not present

## 2022-01-18 NOTE — Progress Notes (Signed)
Subjective:   Sarah Freeman is a 88 y.o. female who presents for Medicare Annual    I connected with  [name] on 01/18/22 by a telephone enabled telemedicine application and verified that I am speaking with the correct person using two identifiers.   I discussed the limitations of evaluation and management by telemedicine. The patient expressed understanding and agreed to proceed.  Patient location: home  Provider location:  Tele-Health not in office   Review of Systems     Cardiac Risk Factors include: advanced age (>50men, >49 women);hypertension;obesity (BMI >30kg/m2)     Objective:    Today's Vitals   There is no height or weight on file to calculate BMI.  Advanced Directives 01/18/2022 05/27/2021 01/16/2021 11/12/2019 07/12/2018 03/06/2018 02/09/2018  Does Patient Have a Medical Advance Directive? No No Yes No Yes Yes No  Type of Advance Directive - Public librarian;Living will - [place];Living will Living will -  Does patient want to make changes to medical advance directive? - - - Yes (MAU/Ambulatory/Procedural Areas - Information given) - - -  Copy of [place] in Chart? - - No - copy requested - No - copy requested - -  Would patient like information on creating a medical advance directive? No - Patient declined No - Patient declined - - - - No - Patient declined    Current Medications (verified) Outpatient Encounter Medications as of 01/18/2022  Medication Sig   acetaminophen (TYLENOL) 650 MG CR tablet Take 650 mg by mouth every 8 (eight) hours as needed for pain.   clopidogrel (PLAVIX) 75 MG tablet Take 1 tablet (75 mg total) by mouth daily.   esomeprazole (NEXIUM) 40 MG capsule Take 1 capsule (40 mg total) by mouth daily.   gabapentin (NEURONTIN) 100 MG capsule TAKE 1 CAPSULE(100 MG) BY MOUTH THREE TIMES DAILY   levothyroxine (SYNTHROID) 50 MCG tablet TAKE 1 TABLET(50 MCG) BY MOUTH AT BEDTIME   Multiple  Vitamins-Minerals (VITAMIN D3 COMPLETE PO) Take by mouth.   nitrofurantoin, macrocrystal-monohydrate, (MACROBID) 100 MG capsule Take 1 capsule (100 mg total) by mouth daily.   polyethylene glycol (MIRALAX / GLYCOLAX) 17 g packet Take 17 g by mouth daily.   PROLIA 60 MG/ML SOSY injection Inject 60 mg into the skin every 6 (six) months.   PSYLLIUM HUSK PO Take by mouth.   Red Yeast Rice Extract (RED YEAST RICE PO) Take 1 capsule by mouth daily.    No facility-administered encounter medications on file as of 01/18/2022.    Allergies (verified) Statins, Bacitracin, Neomycin, Neosporin + pain relief max st  [neomy-bacit-polymyx-pramoxine], Polymyxin b, and Neomycin-polymyxin-gramicidin   History: Past Medical History:  Diagnosis Date   Hyperlipidemia    Hypothyroid    Stroke [facility])    Urinary incontinence    Past Surgical History:  Procedure Laterality Date   BREAST BIOPSY Left 2000?   neg. dr. Dwyane Luo office   CHOLECYSTECTOMY  1964   COLONOSCOPY     Dr Nicolasa Ducking   DILATION AND CURETTAGE OF UTERUS     EYE SURGERY     LOOP RECORDER INSERTION N/A 02/09/2018   Procedure: LOOP RECORDER INSERTION;  Surgeon: Deboraha Sprang, MD;  Location: [facility];  Service: Cardiovascular;  Laterality: N/A;   SKIN CANCER EXCISION     face   TEE WITHOUT CARDIOVERSION N/A 02/09/2018   Procedure: TRANSESOPHAGEAL ECHOCARDIOGRAM (TEE);  Surgeon: Minna Merritts, MD;  Location: [facility];  Service: Cardiovascular;  Laterality: N/A;   TOTAL ABDOMINAL HYSTERECTOMY W/ BILATERAL SALPINGOOPHORECTOMY     age 80   Family History  Problem Relation Age of Onset   Cancer Father        prostate   Stroke Father    Hypertension Sister    Hyperlipidemia Daughter    Heart disease Son    Diabetes Maternal Grandmother    Heart disease Maternal Grandmother    Heart disease Maternal Grandfather    Heart disease Daughter    CAD Mother    Failure to thrive Mother    Breast cancer Neg Hx    Social History    Socioeconomic History   Marital status: Widowed    Spouse name: Not on file   Number of children: Not on file   Years of education: Not on file   Highest education level: Not on file  Occupational History   Occupation: retired  Tobacco Use   Smoking status: Never   Smokeless tobacco: Never  Vaping Use   Vaping Use: Never used  Substance and Sexual Activity   Alcohol use: No    Alcohol/week: 0.0 standard drinks   Drug use: No   Sexual activity: Not Currently  Other Topics Concern   Not on file  Social History Narrative   Not on file   Social Determinants of Health   Financial Resource Strain: Low Risk    Difficulty of Paying Living Expenses: Not hard at all  Food Insecurity: No Food Insecurity   Worried About Charity fundraiser in the Last Year: Never true   [place] in the Last Year: Never true  Transportation Needs: No Transportation Needs   Lack of Transportation (Medical): No   Lack of Transportation (Non-Medical): No  Physical Activity: Inactive   Days of Exercise per Week: 0 days   Minutes of Exercise per Session: 0 min  Stress: No Stress Concern Present   Feeling of Stress : Not at all  Social Connections: Moderately Integrated   Frequency of Communication with Friends and Family: More than three times a week   Frequency of Social Gatherings with Friends and Family: Three times a week   Attends Religious Services: More than 4 times per year   Active Member of Clubs or Organizations: Yes   Attends Archivist Meetings: More than 4 times per year   Marital Status: Widowed    Tobacco Counseling Counseling given: Not Answered   Clinical Intake:  Pre-visit preparation completed: Yes  Pain : No/denies pain     Nutritional Risks: None Diabetes: No  How often do you need to have someone help you when you read instructions, pamphlets, or other written materials from your doctor or pharmacy?: 1 - Never  Diabetic? no  Interpreter  Needed?: No  Information entered by :: Leroy Kennedy LPN   Activities of Daily Living In your present state of health, do you have any difficulty performing the following activities: 01/18/2022  Hearing? Y  Vision? N  Difficulty concentrating or making decisions? N  Walking or climbing stairs? N  Dressing or bathing? N  Doing errands, shopping? N  Preparing Food and eating ? N  Using the Toilet? N  In the past six months, have you accidently leaked urine? N  Do you have problems with loss of bowel control? N  Managing your Medications? N  Housekeeping or managing your Housekeeping? N  Some recent data might be hidden    Patient Care  Team: Valerie Roys, DO as PCP - General (Family Medicine) Minna Merritts, MD as PCP - Cardiology (Cardiology) Bary Castilla Forest Gleason, MD (General Surgery) Valerie Roys, DO as Referring Physician (Family Medicine)  Indicate any recent Medical Services you may have received from other than [facility] providers in the past year (date may be approximate).     Assessment:   This is a routine wellness examination for Momina.  Hearing/Vision screen Hearing Screening - Comments:: Bilateral hearing aids Vision Screening - Comments:: [facility] Up to date  Dietary issues and exercise activities discussed: Current Exercise Habits: The patient does not participate in regular exercise at present   Goals Addressed             This Visit's Progress    DIET - [place]   On track    Recommend drinking at least 6-8 glasses of water a day      Patient Stated       No goals       Depression Screen PHQ 2/9 Scores 01/18/2022 12/29/2021 01/16/2021 11/12/2019 11/12/2019 10/11/2018 03/06/2018  PHQ - 2 Score 0 0 0 0 0 0 0  PHQ- 9 Score 0 0 - 0 - 0 -    Fall Risk Fall Risk  01/18/2022 12/29/2021 01/16/2021 11/12/2019 11/12/2019  Falls in the past year? 0 0 0 0 0  Number falls in past yr: 0 0 - 0 0  Injury with Fall? 0 0 - 0 0  Risk for fall due to  : - No Fall Risks Medication side effect - -  Follow up Falls evaluation completed;Falls prevention discussed Falls evaluation completed Falls evaluation completed;Education provided;Falls prevention discussed - -    FALL RISK PREVENTION PERTAINING TO THE HOME:  Any stairs in or around the home? Yes  If so, are there any without handrails? No  Home free of loose throw rugs in walkways, pet beds, electrical cords, etc? Yes  Adequate lighting in your home to reduce risk of falls? Yes   ASSISTIVE DEVICES UTILIZED TO PREVENT FALLS:  Life alert? No  Use of a cane, walker or w/c? No  Grab bars in the bathroom? Yes  Shower chair or bench in shower? No  Elevated toilet seat or a handicapped toilet? No   TIMED UP AND GO:  Was the test performed? No .    Cognitive Function:     6CIT Screen 01/18/2022 01/16/2021 11/12/2019 03/06/2018  What Year? 4 points 4 points 0 points 0 points  What month? 0 points 0 points 0 points 0 points  What time? 0 points 0 points 0 points 0 points  Count back from 20 2 points 0 points 0 points 0 points  Months in reverse 2 points 0 points 0 points 0 points  Repeat phrase 4 points 10 points 0 points 0 points  Total Score 12 14 0 0    Immunizations Immunization History  Administered Date(s) Administered   Influenza, High Dose Seasonal PF 10/05/2017, 10/11/2018, 08/30/2019   Influenza-Unspecified 10/06/2016, 06/29/2021   PFIZER(Purple Top)SARS-COV-2 Vaccination 12/06/2019, 12/29/2019, 08/29/2020   Pneumococcal Conjugate-13 10/06/2016   Pneumococcal Polysaccharide-23 07/22/2004, 10/05/2017   Td 01/20/2011   Tdap 05/19/2017   Zoster Recombinat (Shingrix) 03/18/2017, 05/19/2017   Zoster, Live 01/09/2008    TDAP status: Up to date  Flu Vaccine status: Up to date  Pneumococcal vaccine status: Up to date  Covid-19 vaccine status: Information provided on how to obtain vaccines.   Qualifies for Shingles Vaccine? No  Zostavax completed Yes   Shingrix  Completed?: Yes  Screening Tests Health Maintenance  Topic Date Due   COVID-19 Vaccine (4 - Booster for Pfizer series) 10/24/2020   TETANUS/TDAP  05/20/2027   Pneumonia Vaccine 7+ Years old  Completed   INFLUENZA VACCINE  Completed   DEXA SCAN  Completed   Zoster Vaccines- Shingrix  Completed   HPV VACCINES  Aged Out    Health Maintenance  Health Maintenance Due  Topic Date Due   COVID-19 Vaccine (4 - Booster for Pfizer series) 10/24/2020    Colorectal cancer screening: No longer required.   Mammogram status: No longer required due to age.  Bone Density no longer required  Lung Cancer Screening: (Low Dose CT Chest recommended if Age 71-80 years, 30 pack-year currently smoking OR have quit w/in 15years.) does not qualify.   Lung Cancer Screening Referral:   Additional Screening:  Hepatitis C Screening: does not qualify;   Vision Screening: Recommended annual ophthalmology exams for early detection of glaucoma and other disorders of the eye. Is the patient up to date with their annual eye exam?  Yes  Who is the provider or what is the name of the office in which the patient attends annual eye exams? [facility]  If pt is not established with a provider, would they like to be referred to a provider to establish care? No .   Dental Screening: Recommended annual dental exams for proper oral hygiene  Community Resource Referral / Chronic Care Management: CRR required this visit?  No   CCM required this visit?  No      Plan:     I have personally reviewed and noted the following in the patients chart:   Medical and social history Use of alcohol, tobacco or illicit drugs  Current medications and supplements including opioid prescriptions.  Functional ability and status Nutritional status Physical activity Advanced directives List of other physicians Hospitalizations, surgeries, and ER visits in previous 12 months Vitals Screenings to include cognitive,  depression, and falls Referrals and appointments  In addition, I have reviewed and discussed with patient certain preventive protocols, quality metrics, and best practice recommendations. A written personalized care plan for preventive services as well as general preventive health recommendations were provided to patient.     Leroy Kennedy, LPN   6/80/3212   Nurse Notes:

## 2022-01-18 NOTE — Patient Instructions (Signed)
Sarah Freeman , Thank you for taking time to come for your Medicare Wellness Visit. I appreciate your ongoing commitment to your health goals. Please review the following plan we discussed and let me know if I can assist you in the future.   Screening recommendations/referrals: Colonoscopy: no longer required Mammogram: no longer required Bone Density: no longer required Recommended yearly ophthalmology/optometry visit for glaucoma screening and checkup Recommended yearly dental visit for hygiene and checkup  Vaccinations: Influenza vaccine: up to date Pneumococcal vaccine: up to date Tdap vaccine: up to date Shingles vaccine: up to date    Advanced directives: Education provided  Conditions/risks identified: =  Next appointment: 06-28-2022 @ 9:20 [facility] 53 Years and Older, Female Preventive care refers to lifestyle choices and visits with your health care provider that can promote health and wellness. What does preventive care include? A yearly physical exam. This is also called an annual well check. Dental exams once or twice a year. Routine eye exams. Ask your health care provider how often you should have your eyes checked. Personal lifestyle choices, including: Daily care of your teeth and gums. Regular physical activity. Eating a healthy diet. Avoiding tobacco and drug use. Limiting alcohol use. Practicing safe sex. Taking low-dose aspirin every day. Taking vitamin and mineral supplements as recommended by your health care provider. What happens during an annual well check? The services and screenings done by your health care provider during your annual well check will depend on your age, overall health, lifestyle risk factors, and family history of disease. Counseling  Your health care provider may ask you questions about your: Alcohol use. Tobacco use. Drug use. Emotional well-being. Home and relationship well-being. Sexual activity. Eating  habits. History of falls. Memory and ability to understand (cognition). Work and work Statistician. Reproductive health. Screening  You may have the following tests or measurements: Height, weight, and BMI. Blood pressure. Lipid and cholesterol levels. These may be checked every 5 years, or more frequently if you are over 19 years old. Skin check. Lung cancer screening. You may have this screening every year starting at age 59 if you have a 30-pack-year history of smoking and currently smoke or have quit within the past 15 years. Fecal occult blood test (FOBT) of the stool. You may have this test every year starting at age 15. Flexible sigmoidoscopy or colonoscopy. You may have a sigmoidoscopy every 5 years or a colonoscopy every 10 years starting at age 49. Hepatitis C blood test. Hepatitis B blood test. Sexually transmitted disease (STD) testing. Diabetes screening. This is done by checking your blood sugar (glucose) after you have not eaten for a while (fasting). You may have this done every 1-3 years. Bone density scan. This is done to screen for osteoporosis. You may have this done starting at age 13. Mammogram. This may be done every 1-2 years. Talk to your health care provider about how often you should have regular mammograms. Talk with your health care provider about your test results, treatment options, and if necessary, the need for more tests. Vaccines  Your health care provider may recommend certain vaccines, such as: Influenza vaccine. This is recommended every year. Tetanus, diphtheria, and acellular pertussis (Tdap, Td) vaccine. You may need a Td booster every 10 years. Zoster vaccine. You may need this after age 36. Pneumococcal 13-valent conjugate (PCV13) vaccine. One dose is recommended after age 59. Pneumococcal polysaccharide (PPSV23) vaccine. One dose is recommended after age 8. Talk to your health care  provider about which screenings and vaccines you need and how  often you need them. This information is not intended to replace advice given to you by your health care provider. Make sure you discuss any questions you have with your health care provider. Document Released: 12/12/2015 Document Revised: 08/04/2016 Document Reviewed: 09/16/2015 Elsevier Interactive Patient Education  2017 [place] Prevention in the Home Falls can cause injuries. They can happen to people of all ages. There are many things you can do to make your home safe and to help prevent falls. What can I do on the outside of my home? Regularly fix the edges of walkways and driveways and fix any cracks. Remove anything that might make you trip as you walk through a door, such as a raised step or threshold. Trim any bushes or trees on the path to your home. Use bright outdoor lighting. Clear any walking paths of anything that might make someone trip, such as rocks or tools. Regularly check to see if handrails are loose or broken. Make sure that both sides of any steps have handrails. Any raised decks and porches should have guardrails on the edges. Have any leaves, snow, or ice cleared regularly. Use sand or salt on walking paths during winter. Clean up any spills in your garage right away. This includes oil or grease spills. What can I do in the bathroom? Use night lights. Install grab bars by the toilet and in the tub and shower. Do not use towel bars as grab bars. Use non-skid mats or decals in the tub or shower. If you need to sit down in the shower, use a plastic, non-slip stool. Keep the floor dry. Clean up any water that spills on the floor as soon as it happens. Remove soap buildup in the tub or shower regularly. Attach bath mats securely with double-sided non-slip rug tape. Do not have throw rugs and other things on the floor that can make you trip. What can I do in the bedroom? Use night lights. Make sure that you have a light by your bed that is easy to  reach. Do not use any sheets or blankets that are too big for your bed. They should not hang down onto the floor. Have a firm chair that has side arms. You can use this for support while you get dressed. Do not have throw rugs and other things on the floor that can make you trip. What can I do in the kitchen? Clean up any spills right away. Avoid walking on wet floors. Keep items that you use a lot in easy-to-reach places. If you need to reach something above you, use a strong step stool that has a grab bar. Keep electrical cords out of the way. Do not use floor polish or wax that makes floors slippery. If you must use wax, use non-skid floor wax. Do not have throw rugs and other things on the floor that can make you trip. What can I do with my stairs? Do not leave any items on the stairs. Make sure that there are handrails on both sides of the stairs and use them. Fix handrails that are broken or loose. Make sure that handrails are as long as the stairways. Check any carpeting to make sure that it is firmly attached to the stairs. Fix any carpet that is loose or worn. Avoid having throw rugs at the top or bottom of the stairs. If you do have throw rugs, attach them to the  floor with carpet tape. Make sure that you have a light switch at the top of the stairs and the bottom of the stairs. If you do not have them, ask someone to add them for you. What else can I do to help prevent falls? Wear shoes that: Do not have high heels. Have rubber bottoms. Are comfortable and fit you well. Are closed at the toe. Do not wear sandals. If you use a stepladder: Make sure that it is fully opened. Do not climb a closed stepladder. Make sure that both sides of the stepladder are locked into place. Ask someone to hold it for you, if possible. Clearly mark and make sure that you can see: Any grab bars or handrails. First and last steps. Where the edge of each step is. Use tools that help you move  around (mobility aids) if they are needed. These include: Canes. Walkers. Scooters. Crutches. Turn on the lights when you go into a dark area. Replace any light bulbs as soon as they burn out. Set up your furniture so you have a clear path. Avoid moving your furniture around. If any of your floors are uneven, fix them. If there are any pets around you, be aware of where they are. Review your medicines with your doctor. Some medicines can make you feel dizzy. This can increase your chance of falling. Ask your doctor what other things that you can do to help prevent falls. This information is not intended to replace advice given to you by your health care provider. Make sure you discuss any questions you have with your health care provider. Document Released: 09/11/2009 Document Revised: 04/22/2016 Document Reviewed: 12/20/2014 Elsevier Interactive Patient Education  2017 [employer].

## 2022-02-13 ENCOUNTER — Other Ambulatory Visit: Payer: Self-pay | Admitting: Family Medicine

## 2022-02-15 NOTE — Telephone Encounter (Signed)
Has newer rx at same pharm ?Requested Prescriptions  ?Pending Prescriptions Disp Refills  ?? clopidogrel (PLAVIX) 75 MG tablet [Pharmacy Med Name: CLOPIDOGREL '75MG'$  TABLETS] 90 tablet 1  ?  Sig: TAKE 1 TABLET(75 MG) BY MOUTH DAILY  ?  ? Hematology: Antiplatelets - clopidogrel Passed - 02/13/2022  7:03 AM  ?  ?  Passed - HCT in normal range and within 180 days  ?  Hematocrit  ?Date Value Ref Range Status  ?12/29/2021 37.1 34.0 - 46.6 % Final  ?   ?  ?  Passed - HGB in normal range and within 180 days  ?  Hemoglobin  ?Date Value Ref Range Status  ?12/29/2021 12.8 11.1 - 15.9 g/dL Final  ?   ?  ?  Passed - PLT in normal range and within 180 days  ?  Platelets  ?Date Value Ref Range Status  ?12/29/2021 310 150 - 450 x10E3/uL Final  ?   ?  ?  Passed - Cr in normal range and within 360 days  ?  Creatinine, Ser  ?Date Value Ref Range Status  ?12/29/2021 0.91 0.57 - 1.00 mg/dL Final  ?   ?  ?  Passed - Valid encounter within last 6 months  ?  Recent Outpatient Visits   ?      ? 1 month ago Routine general medical examination at a health care facility  ? [place], DO  ? 3 months ago Dysuria  ? [facility], Lauren A, NP  ? 5 months ago Dizziness  ? [place], NP  ? 7 months ago Constipation, unspecified constipation type  ? [place], Megan P, DO  ? 1 year ago Essential hypertension  ? [place], [place] P, DO  ?  ?  ?Future Appointments   ?        ? In 4 months Wynetta Emery, Barb Merino, DO [place]  ? In 8 months MacDiarmid, Nicki Reaper, MD [place]  ?  ? ?  ?  ?  ? ? ?

## 2022-03-23 ENCOUNTER — Encounter: Payer: Self-pay | Admitting: Cardiovascular Disease

## 2022-03-26 ENCOUNTER — Other Ambulatory Visit: Payer: Self-pay | Admitting: Nurse Practitioner

## 2022-04-30 ENCOUNTER — Encounter: Payer: Self-pay | Admitting: Physician Assistant

## 2022-04-30 ENCOUNTER — Ambulatory Visit (INDEPENDENT_AMBULATORY_CARE_PROVIDER_SITE_OTHER): Payer: Medicare Other | Admitting: Physician Assistant

## 2022-04-30 VITALS — BP 121/81 | HR 85 | Temp 98.2°F | Wt 181.0 lb

## 2022-04-30 DIAGNOSIS — R300 Dysuria: Secondary | ICD-10-CM

## 2022-04-30 DIAGNOSIS — N390 Urinary tract infection, site not specified: Secondary | ICD-10-CM

## 2022-04-30 LAB — URINALYSIS, ROUTINE W REFLEX MICROSCOPIC
Bilirubin, UA: NEGATIVE
Glucose, UA: NEGATIVE
Ketones, UA: NEGATIVE
Leukocytes,UA: NEGATIVE
Nitrite, UA: NEGATIVE
Protein,UA: NEGATIVE
RBC, UA: NEGATIVE
Specific Gravity, UA: 1.01 (ref 1.005–1.030)
Urobilinogen, Ur: 0.2 mg/dL (ref 0.2–1.0)
pH, UA: 6 (ref 5.0–7.5)

## 2022-04-30 MED ORDER — PHENAZOPYRIDINE HCL 100 MG PO TABS: 100.0000 mg | ORAL_TABLET | Freq: Three times a day (TID) | ORAL | 0 refills | Status: DC | PRN

## 2022-04-30 NOTE — Patient Instructions (Signed)
I have sent in a script for a medication called Pyridium to help with the discomfort  You can take this up to three times per day as needed to assist with urinary discomfort  It was nice to meet you and I appreciate the opportunity to be involved in your care

## 2022-04-30 NOTE — Progress Notes (Signed)
Established Patient Office Visit  Name: Sarah Freeman   MRN: [id number]    DOB: June 27, 1931   Date:04/30/2022  Today's Provider: Talitha Givens, MHS, PA-C Introduced myself to the patient as a PA-C and provided education on APPs in clinical practice.         Subjective  Chief Complaint  Chief Complaint  Patient presents with   Urinary Frequency    Patient states she is urinating frequently that started about 2 months ago. Patient recently started to notice burning with urination.     Urinary Frequency  Associated symptoms include frequency. Pertinent negatives include no chills, flank pain or hematuria.   States she had noticed she is having increased urination  Reports dysuria that started about 3 weeks ago States it is more of stinging/ burning sensation Denies odor or irritation of vulvovaginal area with urination  Denies rash or skin changes to genital area.       Patient Active Problem List   Diagnosis Date Noted   Memory loss 12/29/2021   Aortic atherosclerosis ([place]) 02/27/2021   SVT (supraventricular tachycardia) ([place]) 10/11/2018   HTN (hypertension) 10/11/2018   Chronic kidney disease, stage 3 ([place]) 05/24/2018   Morbid obesity ([place]) 02/02/2018   Carotid stenosis 02/01/2018   Atherosclerosis 01/25/2018   Left arm weakness 01/18/2018   Statin intolerance 06/14/2017   Counseling regarding advanced directives and goals of care 02/22/2017   Personal history of transient ischemic attack (TIA), and cerebral infarction without residual deficits 02/25/2016   Weakness of left leg 02/25/2016   TIA (transient ischemic attack) 02/15/2016   Osteopenia 01/29/2016   Menopausal state 01/29/2016   Hypothyroidism 01/29/2016   GERD (gastroesophageal reflux disease) 01/29/2016   Hypercholesterolemia 01/29/2016   Lumbar spinal stenosis 01/29/2016   Stress incontinence 01/29/2016   Breast microcalcification, mammographic 06/17/2015    Past Surgical History:   Procedure Laterality Date   BREAST BIOPSY Left 2000?   neg. dr. Dwyane Luo office   CHOLECYSTECTOMY  1964   COLONOSCOPY     Dr Nicolasa Ducking   DILATION AND CURETTAGE OF UTERUS     EYE SURGERY     LOOP RECORDER INSERTION N/A 02/09/2018   Procedure: LOOP RECORDER INSERTION;  Surgeon: Deboraha Sprang, MD;  Location: [facility];  Service: Cardiovascular;  Laterality: N/A;   SKIN CANCER EXCISION     face   TEE WITHOUT CARDIOVERSION N/A 02/09/2018   Procedure: TRANSESOPHAGEAL ECHOCARDIOGRAM (TEE);  Surgeon: Minna Merritts, MD;  Location: [facility];  Service: Cardiovascular;  Laterality: N/A;   TOTAL ABDOMINAL HYSTERECTOMY W/ BILATERAL SALPINGOOPHORECTOMY     age 58    Family History  Problem Relation Age of Onset   Cancer Father        prostate   Stroke Father    Hypertension Sister    Hyperlipidemia Daughter    Heart disease Son    Diabetes Maternal Grandmother    Heart disease Maternal Grandmother    Heart disease Maternal Grandfather    Heart disease Daughter    CAD Mother    Failure to thrive Mother    Breast cancer Neg Hx     Social History   Tobacco Use   Smoking status: Never   Smokeless tobacco: Never  Substance Use Topics   Alcohol use: No    Alcohol/week: 0.0 standard drinks     Current Outpatient Medications:    acetaminophen (TYLENOL) 650 MG CR tablet, Take 650 mg by mouth  every 8 (eight) hours as needed for pain., Disp: , Rfl:    clopidogrel (PLAVIX) 75 MG tablet, Take 1 tablet (75 mg total) by mouth daily., Disp: 90 tablet, Rfl: 1   esomeprazole (NEXIUM) 40 MG capsule, Take 1 capsule (40 mg total) by mouth daily., Disp: 90 capsule, Rfl: 1   gabapentin (NEURONTIN) 100 MG capsule, TAKE 1 CAPSULE(100 MG) BY MOUTH THREE TIMES DAILY, Disp: 270 capsule, Rfl: 1   levothyroxine (SYNTHROID) 50 MCG tablet, TAKE 1 TABLET(50 MCG) BY MOUTH AT BEDTIME, Disp: 90 tablet, Rfl: 3   polyethylene glycol (MIRALAX / GLYCOLAX) 17 g packet, Take 17 g by mouth daily., Disp: ,  Rfl:    PROLIA 60 MG/ML SOSY injection, Inject 60 mg into the skin every 6 (six) months., Disp: , Rfl:   Allergies  Allergen Reactions   Statins Other (See Comments)    Leg cramps Leg cramps   Bacitracin    Neomycin    Neosporin + Pain Relief Max St  [Neomy-Bacit-Polymyx-Pramoxine]    Polymyxin B    Neomycin-Polymyxin-Gramicidin Other (See Comments)    Pt states wounds do not heal with neosporin Pt states wounds do not heal with neosporin    I personally reviewed active problem list, medication list, allergies with the patient/caregiver today.   Review of Systems  Constitutional:  Negative for chills and fever.  Gastrointestinal:  Negative for abdominal pain.  Genitourinary:  Positive for dysuria and frequency. Negative for flank pain and hematuria.  Skin:  Negative for rash.     Objective  Vitals:   04/30/22 1548  BP: 121/81  Pulse: 85  Temp: 98.2 F (36.8 C)  SpO2: 100%  Weight: 181 lb (82.1 kg)    Body mass index is 39.17 kg/m.  Physical Exam Vitals reviewed.  Constitutional:      General: She is awake.     Appearance: Normal appearance. She is well-developed and well-groomed. She is obese.  HENT:     Head: Normocephalic and atraumatic.  Pulmonary:     Effort: Pulmonary effort is normal.  Neurological:     Mental Status: She is alert.  Psychiatric:        Attention and Perception: Attention and perception normal.        Mood and Affect: Mood and affect normal.        Speech: Speech normal.        Behavior: Behavior normal. Behavior is cooperative.     Recent Results (from the past 2160 hour(s))  Urinalysis, Routine w reflex microscopic     Status: None   Collection Time: 04/30/22  3:39 PM  Result Value Ref Range   Specific Gravity, UA 1.010 1.005 - 1.030   pH, UA 6.0 5.0 - 7.5   Color, UA Yellow Yellow   Appearance Ur Clear Clear   Leukocytes,UA Negative Negative   Protein,UA Negative Negative/Trace   Glucose, UA Negative Negative   Ketones,  UA Negative Negative   RBC, UA Negative Negative   Bilirubin, UA Negative Negative   Urobilinogen, Ur 0.2 0.2 - 1.0 mg/dL   Nitrite, UA Negative Negative     PHQ2/9:    04/30/2022    3:40 PM 01/18/2022    8:32 AM 12/29/2021    9:14 AM 01/16/2021    8:23 AM 11/12/2019   10:43 AM  Depression screen PHQ 2/9  Decreased Interest 2 0 0 0 0  Down, Depressed, Hopeless 0 0 0 0 0  PHQ - 2 Score 2 0 0 0  0  Altered sleeping 1 0 0  0  Tired, decreased energy 1 0 0  0  Change in appetite 0 0 0  0  Feeling bad or failure about yourself  0 0 0  0  Trouble concentrating 0 0 0  0  Moving slowly or fidgety/restless 0 0 0  0  Suicidal thoughts 0 0 0  0  PHQ-9 Score 4 0 0  0  Difficult doing work/chores     Not difficult at all      Fall Risk:    04/30/2022    3:40 PM 01/18/2022    8:22 AM 12/29/2021    9:16 AM 01/16/2021    8:23 AM 11/12/2019   10:43 AM  Fall Risk   Falls in the past year? 0 0 0 0 0  Number falls in past yr: 0 0 0  0  Injury with Fall? 0 0 0  0  Risk for fall due to : No Fall Risks  No Fall Risks Medication side effect   Follow up Falls evaluation completed Falls evaluation completed;Falls prevention discussed Falls evaluation completed Falls evaluation completed;Education provided;Falls prevention discussed     Urinalysis    Component Value Date/Time   COLORURINE YELLOW (A) 01/18/2018 1925   APPEARANCEUR Clear 04/30/2022 1539   LABSPEC 1.006 01/18/2018 1925   PHURINE 7.0 01/18/2018 1925   GLUCOSEU Negative 04/30/2022 1539   HGBUR NEGATIVE 01/18/2018 1925   BILIRUBINUR Negative 04/30/2022 [place] 01/18/2018 1925   PROTEINUR Negative 04/30/2022 1539   PROTEINUR NEGATIVE 01/18/2018 1925   NITRITE Negative 04/30/2022 1539   NITRITE NEGATIVE 01/18/2018 1925   LEUKOCYTESUR Negative 04/30/2022 1539      Functional Status Survey:      Assessment & Plan  Problem List Items Addressed This Visit   None Visit Diagnoses     Dysuria    -   Primary Acute, new problem Reports increased frequency and developing dysuria over the past few weeks UA was clear today Will provide Pyridium 100 mg PO TID PRN to assist with relief Recommend she stay well hydrated and avoid holding urine to assist with resolution.  Follow up as needed for persistent or progressing symptoms.     Relevant Medications   phenazopyridine (PYRIDIUM) 100 MG tablet   Other Relevant Orders   Urine Culture   Urinalysis, Routine w reflex microscopic (Completed)        No follow-ups on file.   I, Advik Weatherspoon E Geovonni Meyerhoff, PA-C, have reviewed all documentation for this visit. The documentation on 04/30/22 for the exam, diagnosis, procedures, and orders are all accurate and complete.   Talitha Givens, MHS, PA-C [facility]

## 2022-05-03 LAB — URINE CULTURE

## 2022-05-03 MED ORDER — SULFAMETHOXAZOLE-TRIMETHOPRIM 800-160 MG PO TABS: 1.0000 | ORAL_TABLET | Freq: Two times a day (BID) | ORAL | 0 refills | Status: AC

## 2022-05-03 NOTE — Addendum Note (Signed)
Addended by: Talitha Givens on: 05/03/2022 01:08 PM   Modules accepted: Orders

## 2022-05-04 ENCOUNTER — Telehealth: Payer: Self-pay | Admitting: Cardiovascular Disease

## 2022-05-04 NOTE — Telephone Encounter (Signed)
Overdue recall declined.  Patient prefers to fu prn.    Deleting recall per request.

## 2022-05-10 ENCOUNTER — Other Ambulatory Visit (HOSPITAL_COMMUNITY)
Admission: RE | Admit: 2022-05-10 | Discharge: 2022-05-10 | Disposition: A | Discharge status: Home / Self Care | Payer: Medicare Other | Source: Ambulatory Visit | Attending: Family Medicine | Admitting: Family Medicine

## 2022-05-10 ENCOUNTER — Ambulatory Visit (INDEPENDENT_AMBULATORY_CARE_PROVIDER_SITE_OTHER): Payer: Medicare Other | Admitting: Family Medicine

## 2022-05-10 ENCOUNTER — Encounter: Payer: Self-pay | Admitting: Family Medicine

## 2022-05-10 VITALS — BP 107/64 | HR 80 | Temp 97.7°F | Wt 178.4 lb

## 2022-05-10 DIAGNOSIS — I10 Essential (primary) hypertension: Secondary | ICD-10-CM

## 2022-05-10 DIAGNOSIS — D492 Neoplasm of unspecified behavior of bone, soft tissue, and skin: Secondary | ICD-10-CM

## 2022-05-10 DIAGNOSIS — E039 Hypothyroidism, unspecified: Secondary | ICD-10-CM

## 2022-05-10 DIAGNOSIS — R42 Dizziness and giddiness: Secondary | ICD-10-CM

## 2022-05-10 DIAGNOSIS — K219 Gastro-esophageal reflux disease without esophagitis: Secondary | ICD-10-CM

## 2022-05-10 DIAGNOSIS — Z87440 Personal history of urinary (tract) infections: Secondary | ICD-10-CM

## 2022-05-10 DIAGNOSIS — E7800 Pure hypercholesterolemia, unspecified: Secondary | ICD-10-CM

## 2022-05-10 LAB — URINALYSIS, ROUTINE W REFLEX MICROSCOPIC
Bilirubin, UA: NEGATIVE
Glucose, UA: NEGATIVE
Leukocytes,UA: NEGATIVE
Nitrite, UA: NEGATIVE
RBC, UA: NEGATIVE
Specific Gravity, UA: 1.02 (ref 1.005–1.030)
Urobilinogen, Ur: 0.2 mg/dL (ref 0.2–1.0)
pH, UA: 6 (ref 5.0–7.5)

## 2022-05-10 NOTE — Assessment & Plan Note (Signed)
Under good control on current regimen. Continue current regimen. Continue to monitor. Call with any concerns. Refills given. Labs drawn today.   

## 2022-05-10 NOTE — Progress Notes (Signed)
BP 107/64   Pulse 80   Temp 97.7 F (36.5 C)   Wt 178 lb 6.4 oz (80.9 kg)   SpO2 98%   BMI 38.61 kg/m    Subjective:    Patient ID: Sarah Freeman, female    DOB: Mar 12, 1931, 88 y.o.   MRN: [id number]  HPI: Sarah Freeman is a 88 y.o. female  Chief Complaint  Patient presents with   Skin Concern    Patient has a concern about a skin growth on her left hand, states it has been there for months.    Dizziness   SKIN LESION Duration: months Location: L hand Painful: no Itching: no Onset: gradual Context: bigger History of skin cancer: no  DIZZINESS- got very dizzy after sitting outside last week and was feeling weak and had difficulty getting up and going into the house and had to call her daughter. She cooled off and sat and felt better the next morning Duration: several hours Description of symptoms: lightheaded and weak Duration of episode: hours Dizziness frequency:  once, but has happened again Provoking factors: heat Aggravating factors:  unknown Triggered by rolling over in bed: no Triggered by bending over: no Aggravated by head movement: no Aggravated by exertion, coughing, loud noises: no Recent head injury: no Recent or current viral symptoms: no History of vasovagal episodes: no Nausea: yes Vomiting: no Tinnitus: no Hearing loss: no Aural fullness: no Headache: yes Photophobia/phonophobia: no Unsteady gait: yes Postural instability: yes Diplopia, dysarthria, dysphagia or weakness: no Related to exertion: no Pallor: no Diaphoresis: no Dyspnea: no Chest pain: no  HYPERTENSION / HYPERLIPIDEMIA Satisfied with current treatment? yes Duration of hypertension: chronic BP monitoring frequency: not checking BP medication side effects: no Past BP meds: none Duration of hyperlipidemia: chronic Cholesterol medication side effects: not on anything Cholesterol supplements: none Aspirin: no Recent stressors: no Recurrent headaches: no Visual  changes: no Palpitations: no Dyspnea: no Chest pain: no Lower extremity edema: no Dizzy/lightheaded: yes  Relevant past medical, surgical, family and social history reviewed and updated as indicated. Interim medical history since our last visit reviewed. Allergies and medications reviewed and updated.  Review of Systems  Constitutional:  Positive for fatigue. Negative for activity change, appetite change, chills, diaphoresis, fever and unexpected weight change.  Respiratory: Negative.    Cardiovascular: Negative.   Gastrointestinal: Negative.   Musculoskeletal: Negative.   Neurological:  Positive for dizziness, weakness and light-headedness. Negative for tremors, seizures, syncope, facial asymmetry, speech difficulty, numbness and headaches.  Hematological: Negative.   Psychiatric/Behavioral: Negative.      Per HPI unless specifically indicated above     Objective:    BP 107/64   Pulse 80   Temp 97.7 F (36.5 C)   Wt 178 lb 6.4 oz (80.9 kg)   SpO2 98%   BMI 38.61 kg/m   Wt Readings from Last 3 Encounters:  05/10/22 178 lb 6.4 oz (80.9 kg)  04/30/22 181 lb (82.1 kg)  12/29/21 184 lb 6.4 oz (83.6 kg)    Physical Exam Vitals and nursing note reviewed.  Constitutional:      General: She is not in acute distress.    Appearance: Normal appearance. She is not ill-appearing, toxic-appearing or diaphoretic.  HENT:     Head: Normocephalic and atraumatic.     Right Ear: External ear normal.     Left Ear: External ear normal.     Nose: Nose normal.     Mouth/Throat:     Mouth: Mucous membranes  are moist.     Pharynx: Oropharynx is clear.  Eyes:     General: No scleral icterus.       Right eye: No discharge.        Left eye: No discharge.     Extraocular Movements: Extraocular movements intact.     Conjunctiva/sclera: Conjunctivae normal.     Pupils: Pupils are equal, round, and reactive to light.  Cardiovascular:     Rate and Rhythm: Normal rate and regular rhythm.      Pulses: Normal pulses.     Heart sounds: Normal heart sounds. No murmur heard.    No friction rub. No gallop.  Pulmonary:     Effort: Pulmonary effort is normal. No respiratory distress.     Breath sounds: Normal breath sounds. No stridor. No wheezing, rhonchi or rales.  Chest:     Chest wall: No tenderness.  Musculoskeletal:        General: Normal range of motion.     Cervical back: Normal range of motion and neck supple.  Skin:    General: Skin is warm and dry.     Capillary Refill: Capillary refill takes less than 2 seconds.     Coloration: Skin is not jaundiced or pale.     Findings: No bruising, erythema, lesion or rash.     Comments: Cutaneous flesh colored growth on R hand about 1cm at base and 1 cm high  Neurological:     General: No focal deficit present.     Mental Status: She is alert and oriented to person, place, and time. Mental status is at baseline.     Cranial Nerves: No cranial nerve deficit.     Sensory: No sensory deficit.     Motor: No weakness.     Coordination: Coordination normal.     Gait: Gait normal.     Deep Tendon Reflexes: Reflexes normal.  Psychiatric:        Mood and Affect: Mood normal.        Behavior: Behavior normal.        Thought Content: Thought content normal.        Judgment: Judgment normal.     Results for orders placed or performed in visit on 05/10/22  Comprehensive metabolic panel  Result Value Ref Range   Glucose 113 (H) 70 - 99 mg/dL   BUN 16 8 - 27 mg/dL   Creatinine, Ser 1.13 (H) 0.57 - 1.00 mg/dL   eGFR 47 (L) >59 mL/min/1.73   BUN/Creatinine Ratio 14 12 - 28   Sodium 133 (L) 134 - 144 mmol/L   Potassium 4.8 3.5 - 5.2 mmol/L   Chloride 99 96 - 106 mmol/L   CO2 22 20 - 29 mmol/L   Calcium 9.1 8.7 - 10.3 mg/dL   Total Protein 7.0 6.0 - 8.5 g/dL   Albumin 4.2 3.6 - 4.6 g/dL   Globulin, Total 2.8 1.5 - 4.5 g/dL   Albumin/Globulin Ratio 1.5 1.2 - 2.2   Bilirubin Total 0.4 0.0 - 1.2 mg/dL   Alkaline Phosphatase 41 (L)  44 - 121 IU/L   AST 39 0 - 40 IU/L   ALT 16 0 - 32 IU/L  CBC with Differential/Platelet  Result Value Ref Range   WBC 7.8 3.4 - 10.8 x10E3/uL   RBC 3.83 3.77 - 5.28 x10E6/uL   Hemoglobin 12.6 11.1 - 15.9 g/dL   Hematocrit 37.2 34.0 - 46.6 %   MCV 97 79 - 97 fL   MCH 32.9  26.6 - 33.0 pg   MCHC 33.9 31.5 - 35.7 g/dL   RDW 04.9 31.2 - 41.5 %   Platelets 332 150 - 450 x10E3/uL   Neutrophils 55 Not Estab. %   Lymphs 31 Not Estab. %   Monocytes 11 Not Estab. %   Eos 2 Not Estab. %   Basos 1 Not Estab. %   Neutrophils Absolute 4.3 1.4 - 7.0 x10E3/uL   Lymphocytes Absolute 2.4 0.7 - 3.1 x10E3/uL   Monocytes Absolute 0.8 0.1 - 0.9 x10E3/uL   EOS (ABSOLUTE) 0.2 0.0 - 0.4 x10E3/uL   Basophils Absolute 0.1 0.0 - 0.2 x10E3/uL   Immature Granulocytes 0 Not Estab. %   Immature Grans (Abs) 0.0 0.0 - 0.1 x10E3/uL  TSH  Result Value Ref Range   TSH 2.180 0.450 - 4.500 uIU/mL  Urinalysis, Routine w reflex microscopic  Result Value Ref Range   Specific Gravity, UA 1.020 1.005 - 1.030   pH, UA 6.0 5.0 - 7.5   Color, UA Yellow Yellow   Appearance Ur Clear Clear   Leukocytes,UA Negative Negative   Protein,UA Trace (A) Negative/Trace   Glucose, UA Negative Negative   Ketones, UA Trace (A) Negative   RBC, UA Negative Negative   Bilirubin, UA Negative Negative   Urobilinogen, Ur 0.2 0.2 - 1.0 mg/dL   Nitrite, UA Negative Negative  Lipid Panel w/o Chol/HDL Ratio  Result Value Ref Range   Cholesterol, Total 231 (H) 100 - 199 mg/dL   Triglycerides 687 (H) 0 - 149 mg/dL   HDL 50 >07 mg/dL   VLDL Cholesterol Cal 32 5 - 40 mg/dL   LDL Chol Calc (NIH) 289 (H) 0 - 99 mg/dL      Assessment & Plan:   Problem List Items Addressed This Visit       Cardiovascular and Mediastinum   HTN (hypertension)    Under good control on current regimen. Continue current regimen. Continue to monitor. Call with any concerns. Refills given. Labs drawn today.       Relevant Orders   Comprehensive metabolic  panel (Completed)     Digestive   GERD (gastroesophageal reflux disease)    Under good control on current regimen. Continue current regimen. Continue to monitor. Call with any concerns. Refills given. Labs drawn today.       Relevant Orders   Comprehensive metabolic panel (Completed)   CBC with Differential/Platelet (Completed)     Endocrine   Hypothyroidism    Rechecking lab today. Await results. Treat as needed.       Relevant Orders   Comprehensive metabolic panel (Completed)   TSH (Completed)     Other   Hypercholesterolemia    Under good control on current regimen. Continue current regimen. Continue to monitor. Call with any concerns. Refills given. Labs drawn today.       Relevant Orders   Comprehensive metabolic panel (Completed)   Lipid Panel w/o Chol/HDL Ratio (Completed)   Other Visit Diagnoses     Neoplasm of skin    -  Primary   Concern for cutaneous horn, but will send for pathology. Removed today as below.    Relevant Orders   Surgical pathology   Dizziness       Concern for issues with overheating and dehydration. Stressed the importance of being careful in the sun and drinking plenty of fluids. Will check labs.   Relevant Orders   Comprehensive metabolic panel (Completed)   CBC with Differential/Platelet (Completed)   TSH (Completed)  Urinalysis, Routine w reflex microscopic (Completed)   History of recurrent UTIs       Will check UA today. Await results.    Relevant Orders   Urinalysis, Routine w reflex microscopic (Completed)      Skin Procedure  Procedure: Informed consent given.  Sterile prep of the area.  Area infiltrated with lidocaine without epinephrine.  Using a surgical blade, part of the upper dermis shaved off and sent  for pathology.  Area cauterized. Pt ed on scarring.  Diagnosis:   ICD-10-CM   1. Neoplasm of skin  D49.2 Surgical pathology   Concern for cutaneous horn, but will send for pathology. Removed today as below.     2.  Dizziness  R42 Comprehensive metabolic panel    CBC with Differential/Platelet    TSH    Urinalysis, Routine w reflex microscopic   Concern for issues with overheating and dehydration. Stressed the importance of being careful in the sun and drinking plenty of fluids. Will check labs.    3. Primary hypertension  I10 Comprehensive metabolic panel    4. Hypothyroidism, unspecified type  E03.9 Comprehensive metabolic panel    TSH    5. Gastroesophageal reflux disease, unspecified whether esophagitis present  K21.9 Comprehensive metabolic panel    CBC with Differential/Platelet    6. Hypercholesterolemia  E78.00 Comprehensive metabolic panel    Lipid Panel w/o Chol/HDL Ratio    7. History of recurrent UTIs  Z87.440 Urinalysis, Routine w reflex microscopic   Will check UA today. Await results.       Lesion Location/Size: Cutaneous flesh colored growth on R hand about 1cm at base and 1 cm high Physician: MJ Consent:  Risks, benefits, and alternative treatments discussed and all questions were answered.  Patient elected to proceed and verbal consent obtained.  Description: Area prepped and draped using semi-sterile technique. Area locally anesthetized using 3 cc's of lidocaine 1% plain. Shave biopsy of lesion performed using a dermablade.  Adequate hemostastis achieved using Silver Nitrate. Wound dressed after application of bacitracin ointment.  Post Procedure Instructions:  Wound care instructions discussed and patient was instructed to keep area clean and dry.  Signs and symptoms of infection discussed, patient agrees to contact the office ASAP should they occur.  Dressing change recommended every other day.  Follow up plan: Return As scheduled.

## 2022-05-10 NOTE — Assessment & Plan Note (Signed)
Rechecking lab today. Await results. Treat as needed.  

## 2022-05-11 ENCOUNTER — Encounter: Payer: Self-pay | Admitting: Family Medicine

## 2022-05-11 LAB — LIPID PANEL W/O CHOL/HDL RATIO
Cholesterol, Total: 231 mg/dL — ABNORMAL HIGH (ref 100–199)
HDL: 50 mg/dL (ref >39)
LDL Chol Calc (NIH): 149 mg/dL — ABNORMAL HIGH (ref 0–99)
Triglycerides: 177 mg/dL — ABNORMAL HIGH (ref 0–149)
VLDL Cholesterol Cal: 32 mg/dL (ref 5–40)

## 2022-05-11 LAB — TSH: TSH: 2.18 u[IU]/mL (ref 0.450–4.500)

## 2022-05-11 LAB — CBC WITH DIFFERENTIAL/PLATELET
Basophils Absolute: 0.1 10*3/uL (ref 0.0–0.2)
Basos: 1 %
EOS (ABSOLUTE): 0.2 10*3/uL (ref 0.0–0.4)
Eos: 2 %
Hematocrit: 37.2 % (ref 34.0–46.6)
Hemoglobin: 12.6 g/dL (ref 11.1–15.9)
Immature Grans (Abs): 0 10*3/uL (ref 0.0–0.1)
Immature Granulocytes: 0 %
Lymphocytes Absolute: 2.4 10*3/uL (ref 0.7–3.1)
Lymphs: 31 %
MCH: 32.9 pg (ref 26.6–33.0)
MCHC: 33.9 g/dL (ref 31.5–35.7)
MCV: 97 fL (ref 79–97)
Monocytes Absolute: 0.8 10*3/uL (ref 0.1–0.9)
Monocytes: 11 %
Neutrophils Absolute: 4.3 10*3/uL (ref 1.4–7.0)
Neutrophils: 55 %
Platelets: 332 10*3/uL (ref 150–450)
RBC: 3.83 x10E6/uL (ref 3.77–5.28)
RDW: 12.8 % (ref 11.7–15.4)
WBC: 7.8 10*3/uL (ref 3.4–10.8)

## 2022-05-11 LAB — COMPREHENSIVE METABOLIC PANEL
ALT: 16 IU/L (ref 0–32)
AST: 39 IU/L (ref 0–40)
Albumin/Globulin Ratio: 1.5 (ref 1.2–2.2)
Albumin: 4.2 g/dL (ref 3.6–4.6)
Alkaline Phosphatase: 41 IU/L — ABNORMAL LOW (ref 44–121)
BUN/Creatinine Ratio: 14 (ref 12–28)
BUN: 16 mg/dL (ref 8–27)
Bilirubin Total: 0.4 mg/dL (ref 0.0–1.2)
CO2: 22 mmol/L (ref 20–29)
Calcium: 9.1 mg/dL (ref 8.7–10.3)
Chloride: 99 mmol/L (ref 96–106)
Creatinine, Ser: 1.13 mg/dL — ABNORMAL HIGH (ref 0.57–1.00)
Globulin, Total: 2.8 g/dL (ref 1.5–4.5)
Glucose: 113 mg/dL — ABNORMAL HIGH (ref 70–99)
Potassium: 4.8 mmol/L (ref 3.5–5.2)
Sodium: 133 mmol/L — ABNORMAL LOW (ref 134–144)
Total Protein: 7 g/dL (ref 6.0–8.5)
eGFR: 47 mL/min/{1.73_m2} — ABNORMAL LOW (ref >59)

## 2022-05-12 LAB — SURGICAL PATHOLOGY

## 2022-05-13 ENCOUNTER — Encounter: Payer: Self-pay | Admitting: Family Medicine

## 2022-05-14 ENCOUNTER — Other Ambulatory Visit: Payer: Self-pay | Admitting: Family Medicine

## 2022-05-14 DIAGNOSIS — C44629 Squamous cell carcinoma of skin of left upper limb, including shoulder: Secondary | ICD-10-CM

## 2022-05-14 NOTE — Telephone Encounter (Signed)
Patient's daughter called. Referral to dermatology placed.

## 2022-06-27 ENCOUNTER — Encounter: Payer: Self-pay | Admitting: Family Medicine

## 2022-06-28 ENCOUNTER — Ambulatory Visit (INDEPENDENT_AMBULATORY_CARE_PROVIDER_SITE_OTHER): Payer: Medicare Other | Admitting: Family Medicine

## 2022-06-28 ENCOUNTER — Encounter: Payer: Self-pay | Admitting: Family Medicine

## 2022-06-28 VITALS — BP 105/72 | HR 79 | Temp 98.4°F | Wt 175.0 lb

## 2022-06-28 DIAGNOSIS — E039 Hypothyroidism, unspecified: Secondary | ICD-10-CM | POA: Diagnosis not present

## 2022-06-28 DIAGNOSIS — N183 Chronic kidney disease, stage 3 unspecified: Secondary | ICD-10-CM | POA: Diagnosis not present

## 2022-06-28 DIAGNOSIS — K219 Gastro-esophageal reflux disease without esophagitis: Secondary | ICD-10-CM

## 2022-06-28 DIAGNOSIS — E7800 Pure hypercholesterolemia, unspecified: Secondary | ICD-10-CM

## 2022-06-28 DIAGNOSIS — I10 Essential (primary) hypertension: Secondary | ICD-10-CM | POA: Diagnosis not present

## 2022-06-28 DIAGNOSIS — R413 Other amnesia: Secondary | ICD-10-CM

## 2022-06-28 DIAGNOSIS — R8281 Pyuria: Secondary | ICD-10-CM

## 2022-06-28 DIAGNOSIS — Z789 Other specified health status: Secondary | ICD-10-CM

## 2022-06-28 LAB — URINALYSIS, ROUTINE W REFLEX MICROSCOPIC
Bilirubin, UA: NEGATIVE
Glucose, UA: NEGATIVE
Nitrite, UA: NEGATIVE
RBC, UA: NEGATIVE
Specific Gravity, UA: 1.015 (ref 1.005–1.030)
Urobilinogen, Ur: 1 mg/dL (ref 0.2–1.0)
pH, UA: 7 (ref 5.0–7.5)

## 2022-06-28 LAB — MICROSCOPIC EXAMINATION: RBC, Urine: NONE SEEN /hpf (ref 0–2)

## 2022-06-28 MED ORDER — ESOMEPRAZOLE MAGNESIUM 40 MG PO CPDR: 40.0000 mg | DELAYED_RELEASE_CAPSULE | Freq: Every day | ORAL | 1 refills | Status: DC

## 2022-06-28 MED ORDER — GABAPENTIN 100 MG PO CAPS: ORAL_CAPSULE | ORAL | 1 refills | Status: DC

## 2022-06-28 MED ORDER — CLOPIDOGREL BISULFATE 75 MG PO TABS: 75.0000 mg | ORAL_TABLET | Freq: Every day | ORAL | 1 refills | Status: DC

## 2022-06-28 MED ORDER — DONEPEZIL HCL 5 MG PO TABS: 5.0000 mg | ORAL_TABLET | Freq: Every day | ORAL | 1 refills | Status: DC

## 2022-06-28 NOTE — Assessment & Plan Note (Signed)
Rechecking labs today. Await results. Treat as needed.  °

## 2022-06-28 NOTE — Assessment & Plan Note (Signed)
Under good control on current regimen. Continue current regimen. Continue to monitor. Call with any concerns. Refills given. Labs drawn today.   

## 2022-06-28 NOTE — Assessment & Plan Note (Signed)
Unable to tolerate statins. Rechecking labs today. Await results.

## 2022-06-28 NOTE — Assessment & Plan Note (Signed)
Daughter and son are very concerned. She is less concerned. Will start her on aricept. Call with any concerns. Continue to monitor. Recheck 3 months.

## 2022-06-28 NOTE — Progress Notes (Signed)
BP 105/72   Pulse 79   Temp 98.4 F (36.9 C)   Wt 175 lb (79.4 kg)   SpO2 100%   BMI 37.87 kg/m    Subjective:    Patient ID: Sarah Freeman, female    DOB: 06-15-1931, 88 y.o.   MRN: [id number]  HPI: Sarah Freeman is a 88 y.o. female  Chief Complaint  Patient presents with   Hypothyroidism   Hypertension   Chronic Kidney Disease   Memory Loss    Patient daughter is concerned about her memory, states she has lost her sense of time. Shows up hours early to places, or too late. Forgets where she is going and who she is meeting.    HYPERTENSION / HYPERLIPIDEMIA Satisfied with current treatment? yes Duration of hypertension: chronic BP monitoring frequency: not checking BP medication side effects: no Past BP meds: none Duration of hyperlipidemia: chronic Cholesterol medication side effects: yes Cholesterol supplements: none Past cholesterol medications: none Medication compliance: N/A Aspirin: no Recent stressors: no Recurrent headaches: no Visual changes: no Palpitations: no Dyspnea: no Chest pain: no Lower extremity edema: no Dizzy/lightheaded: no  HYPOTHYROIDISM Thyroid control status:controlled Satisfied with current treatment? yes Medication side effects: no Medication compliance: excellent compliance Recent dose adjustment:no Fatigue: no Cold intolerance: no Heat intolerance: no Weight gain: no Weight loss: no Constipation: yes Diarrhea/loose stools: no Palpitations: no Lower extremity edema: no Anxiety/depressed mood: no  Memory loss  Forgets things when she goes into a room. Having issues with time- shows up really early to do something. Forgetting days of the week. Time of day.      06/28/2022    9:41 AM  MMSE - Mini Mental State Exam  Orientation to time 3  Orientation to Place 3  Registration 3  Attention/ Calculation 0  Recall 0  Language- name 2 objects 2  Language- repeat 1  Language- follow 3 step command 3  Language- read  & follow direction 1  Write a sentence 1  Copy design 1  Total score 18    Relevant past medical, surgical, family and social history reviewed and updated as indicated. Interim medical history since our last visit reviewed. Allergies and medications reviewed and updated.  Review of Systems  Constitutional: Negative.   Respiratory: Negative.    Cardiovascular: Negative.   Gastrointestinal: Negative.   Musculoskeletal: Negative.   Neurological: Negative.   Psychiatric/Behavioral: Negative.      Per HPI unless specifically indicated above     Objective:    BP 105/72   Pulse 79   Temp 98.4 F (36.9 C)   Wt 175 lb (79.4 kg)   SpO2 100%   BMI 37.87 kg/m   Wt Readings from Last 3 Encounters:  06/28/22 175 lb (79.4 kg)  05/10/22 178 lb 6.4 oz (80.9 kg)  04/30/22 181 lb (82.1 kg)    Physical Exam Vitals and nursing note reviewed.  Constitutional:      General: She is not in acute distress.    Appearance: Normal appearance. She is obese. She is not ill-appearing, toxic-appearing or diaphoretic.  HENT:     Head: Normocephalic and atraumatic.     Right Ear: External ear normal.     Left Ear: External ear normal.     Nose: Nose normal.     Mouth/Throat:     Mouth: Mucous membranes are moist.     Pharynx: Oropharynx is clear.  Eyes:     General: No scleral icterus.  Right eye: No discharge.        Left eye: No discharge.     Extraocular Movements: Extraocular movements intact.     Conjunctiva/sclera: Conjunctivae normal.     Pupils: Pupils are equal, round, and reactive to light.  Cardiovascular:     Rate and Rhythm: Normal rate and regular rhythm.     Pulses: Normal pulses.     Heart sounds: Normal heart sounds. No murmur heard.    No friction rub. No gallop.  Pulmonary:     Effort: Pulmonary effort is normal. No respiratory distress.     Breath sounds: Normal breath sounds. No stridor. No wheezing, rhonchi or rales.  Chest:     Chest wall: No tenderness.   Musculoskeletal:        General: Normal range of motion.     Cervical back: Normal range of motion and neck supple.  Skin:    General: Skin is warm and dry.     Capillary Refill: Capillary refill takes less than 2 seconds.     Coloration: Skin is not jaundiced or pale.     Findings: No bruising, erythema, lesion or rash.  Neurological:     General: No focal deficit present.     Mental Status: She is alert and oriented to person, place, and time. Mental status is at baseline.  Psychiatric:        Mood and Affect: Mood normal.        Behavior: Behavior normal.        Thought Content: Thought content normal.        Judgment: Judgment normal.     Results for orders placed or performed in visit on 05/10/22  Comprehensive metabolic panel  Result Value Ref Range   Glucose 113 (H) 70 - 99 mg/dL   BUN 16 8 - 27 mg/dL   Creatinine, Ser 1.13 (H) 0.57 - 1.00 mg/dL   eGFR 47 (L) >59 mL/min/1.73   BUN/Creatinine Ratio 14 12 - 28   Sodium 133 (L) 134 - 144 mmol/L   Potassium 4.8 3.5 - 5.2 mmol/L   Chloride 99 96 - 106 mmol/L   CO2 22 20 - 29 mmol/L   Calcium 9.1 8.7 - 10.3 mg/dL   Total Protein 7.0 6.0 - 8.5 g/dL   Albumin 4.2 3.6 - 4.6 g/dL   Globulin, Total 2.8 1.5 - 4.5 g/dL   Albumin/Globulin Ratio 1.5 1.2 - 2.2   Bilirubin Total 0.4 0.0 - 1.2 mg/dL   Alkaline Phosphatase 41 (L) 44 - 121 IU/L   AST 39 0 - 40 IU/L   ALT 16 0 - 32 IU/L  CBC with Differential/Platelet  Result Value Ref Range   WBC 7.8 3.4 - 10.8 x10E3/uL   RBC 3.83 3.77 - 5.28 x10E6/uL   Hemoglobin 12.6 11.1 - 15.9 g/dL   Hematocrit 37.2 34.0 - 46.6 %   MCV 97 79 - 97 fL   MCH 32.9 26.6 - 33.0 pg   MCHC 33.9 31.5 - 35.7 g/dL   RDW 12.8 11.7 - 15.4 %   Platelets 332 150 - 450 x10E3/uL   Neutrophils 55 Not Estab. %   Lymphs 31 Not Estab. %   Monocytes 11 Not Estab. %   Eos 2 Not Estab. %   Basos 1 Not Estab. %   Neutrophils Absolute 4.3 1.4 - 7.0 x10E3/uL   Lymphocytes Absolute 2.4 0.7 - 3.1 x10E3/uL    Monocytes Absolute 0.8 0.1 - 0.9 x10E3/uL   EOS (ABSOLUTE)  0.2 0.0 - 0.4 x10E3/uL   Basophils Absolute 0.1 0.0 - 0.2 x10E3/uL   Immature Granulocytes 0 Not Estab. %   Immature Grans (Abs) 0.0 0.0 - 0.1 x10E3/uL  TSH  Result Value Ref Range   TSH 2.180 0.450 - 4.500 uIU/mL  Urinalysis, Routine w reflex microscopic  Result Value Ref Range   Specific Gravity, UA 1.020 1.005 - 1.030   pH, UA 6.0 5.0 - 7.5   Color, UA Yellow Yellow   Appearance Ur Clear Clear   Leukocytes,UA Negative Negative   Protein,UA Trace (A) Negative/Trace   Glucose, UA Negative Negative   Ketones, UA Trace (A) Negative   RBC, UA Negative Negative   Bilirubin, UA Negative Negative   Urobilinogen, Ur 0.2 0.2 - 1.0 mg/dL   Nitrite, UA Negative Negative  Lipid Panel w/o Chol/HDL Ratio  Result Value Ref Range   Cholesterol, Total 231 (H) 100 - 199 mg/dL   Triglycerides 177 (H) 0 - 149 mg/dL   HDL 50 >39 mg/dL   VLDL Cholesterol Cal 32 5 - 40 mg/dL   LDL Chol Calc (NIH) 149 (H) 0 - 99 mg/dL  Surgical pathology  Result Value Ref Range   SURGICAL PATHOLOGY      SURGICAL PATHOLOGY CASE: [phone] PATIENT: [facility] Surgical Pathology Report     Clinical History: Neoplasm of skin, hard nodule on hand (nt)     FINAL MICROSCOPIC DIAGNOSIS:  A. 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 -  No dermis is present in the biopsy specimen to evaluate for invasion.   GROSS DESCRIPTION:  A. Received in formalin labeled with the patients name and DOB is a 0.8 x 0.5 cm light tan, scaly, firm piece of skin excised to a maximum depth of 0.7 cm that is inked black, sectioned, and entirely submitted in a single cassette.  (LEF 05/12/2022)   Final Diagnosis performed by Tilford Pillar DO.   Electronically signed 05/12/2022 Technical component performed at [employer]. [facility], [place] [address] [place], [place] [address].   Professional component performed at [facility], [place] [address] [place], [place] [address].  Immunoh istochemistry Technical component (if applicable) was performed at [facility]. [address] [place], [place], [place] [address].   IMMUNOHISTOCHEMISTRY DISCLAIMER (if applicable): Some of these immunohistochemical stains may have been developed and the performance characteristics determine by [facility]. Some may not have been cleared or approved by the U.S. Food and Drug Administration. The FDA has determined that such clearance or approval is not necessary. This test is used for clinical purposes. It should not be regarded as investigational or for research. This laboratory is certified under the [place] (CLIA-88) as qualified to perform high complexity clinical laboratory testing.  The controls stained appropriately.       Assessment & Plan:   Problem List Items Addressed This Visit       Cardiovascular and Mediastinum   HTN (hypertension)    Doing well off medicine. Continue to monitor. Call with any concerns. Call with any concerns.       Relevant Orders   CBC with Differential/Platelet   Comprehensive metabolic panel     Digestive   GERD (gastroesophageal reflux disease)    Under good control on current regimen. Continue current regimen. Continue to monitor. Call with any concerns. Refills given. Labs drawn today.  Relevant Medications   esomeprazole (NEXIUM) 40 MG capsule   Other Relevant Orders   CBC with Differential/Platelet   Comprehensive metabolic panel     Endocrine   Hypothyroidism - Primary    Rechecking labs today. Await results. Treat as needed.       Relevant Orders   TSH     Genitourinary   Chronic kidney disease, stage 3 ([place])    Rechecking labs today. Await results. Treat as needed.       Relevant Orders   CBC with  Differential/Platelet   Comprehensive metabolic panel     Other   Hypercholesterolemia   Relevant Orders   CBC with Differential/Platelet   Comprehensive metabolic panel   Lipid Panel w/o Chol/HDL Ratio   Statin intolerance    Unable to tolerate statins. Rechecking labs today. Await results.       Memory loss    Daughter and son are very concerned. She is less concerned. Will start her on aricept. Call with any concerns. Continue to monitor. Recheck 3 months.       Relevant Orders   Urinalysis, Routine w reflex microscopic   Other Visit Diagnoses     Pyuria       Labs drawn today. Await results.   Relevant Orders   Urine Culture        Follow up plan: Return in about 3 months (around 09/28/2022).

## 2022-06-28 NOTE — Assessment & Plan Note (Signed)
Doing well off medicine. Continue to monitor. Call with any concerns. Call with any concerns.

## 2022-06-29 ENCOUNTER — Encounter: Payer: Self-pay | Admitting: Family Medicine

## 2022-06-29 LAB — COMPREHENSIVE METABOLIC PANEL
ALT: 16 IU/L (ref 0–32)
AST: 21 IU/L (ref 0–40)
Albumin/Globulin Ratio: 1.3 (ref 1.2–2.2)
Albumin: 3.9 g/dL (ref 3.7–4.7)
Alkaline Phosphatase: 55 IU/L (ref 44–121)
BUN/Creatinine Ratio: 8 — ABNORMAL LOW (ref 12–28)
BUN: 8 mg/dL (ref 8–27)
Bilirubin Total: 0.4 mg/dL (ref 0.0–1.2)
CO2: 23 mmol/L (ref 20–29)
Calcium: 9.1 mg/dL (ref 8.7–10.3)
Chloride: 100 mmol/L (ref 96–106)
Creatinine, Ser: 0.95 mg/dL (ref 0.57–1.00)
Globulin, Total: 2.9 g/dL (ref 1.5–4.5)
Glucose: 94 mg/dL (ref 70–99)
Potassium: 4.5 mmol/L (ref 3.5–5.2)
Sodium: 136 mmol/L (ref 134–144)
Total Protein: 6.8 g/dL (ref 6.0–8.5)
eGFR: 57 mL/min/{1.73_m2} — ABNORMAL LOW (ref >59)

## 2022-06-29 LAB — LIPID PANEL W/O CHOL/HDL RATIO
Cholesterol, Total: 207 mg/dL — ABNORMAL HIGH (ref 100–199)
HDL: 46 mg/dL (ref >39)
LDL Chol Calc (NIH): 132 mg/dL — ABNORMAL HIGH (ref 0–99)
Triglycerides: 163 mg/dL — ABNORMAL HIGH (ref 0–149)
VLDL Cholesterol Cal: 29 mg/dL (ref 5–40)

## 2022-06-29 LAB — CBC WITH DIFFERENTIAL/PLATELET
Basophils Absolute: 0.1 10*3/uL (ref 0.0–0.2)
Basos: 2 %
EOS (ABSOLUTE): 0.3 10*3/uL (ref 0.0–0.4)
Eos: 4 %
Hematocrit: 40 % (ref 34.0–46.6)
Hemoglobin: 12.9 g/dL (ref 11.1–15.9)
Immature Grans (Abs): 0 10*3/uL (ref 0.0–0.1)
Immature Granulocytes: 0 %
Lymphocytes Absolute: 2.5 10*3/uL (ref 0.7–3.1)
Lymphs: 34 %
MCH: 31.8 pg (ref 26.6–33.0)
MCHC: 32.3 g/dL (ref 31.5–35.7)
MCV: 99 fL — ABNORMAL HIGH (ref 79–97)
Monocytes Absolute: 0.9 10*3/uL (ref 0.1–0.9)
Monocytes: 12 %
Neutrophils Absolute: 3.5 10*3/uL (ref 1.4–7.0)
Neutrophils: 48 %
Platelets: 325 10*3/uL (ref 150–450)
RBC: 4.06 x10E6/uL (ref 3.77–5.28)
RDW: 12.7 % (ref 11.7–15.4)
WBC: 7.3 10*3/uL (ref 3.4–10.8)

## 2022-06-29 LAB — TSH: TSH: 3.09 u[IU]/mL (ref 0.450–4.500)

## 2022-07-02 ENCOUNTER — Other Ambulatory Visit: Payer: Self-pay | Admitting: Physician Assistant

## 2022-07-02 DIAGNOSIS — N3000 Acute cystitis without hematuria: Secondary | ICD-10-CM

## 2022-07-02 LAB — URINE CULTURE

## 2022-07-02 MED ORDER — CIPROFLOXACIN HCL 500 MG PO TABS: 500.0000 mg | ORAL_TABLET | Freq: Every day | ORAL | 0 refills | Status: AC

## 2022-07-05 ENCOUNTER — Telehealth: Payer: Self-pay

## 2022-07-05 NOTE — Telephone Encounter (Signed)
Pt's daughter returned call after office closed, seeking feedback

## 2022-07-05 NOTE — Telephone Encounter (Signed)
ciprofloxacin (CIPRO) 500 MG tablet 3 tablet  Pt daughter was out of town, was wanting futher explanation from this am. Did not understand only 3 tablets given. Pls call Almyra Free to verify, [phone]. [place] returned call but when called back [place] gone for the day. FU with pt by Junie Panning was suggested by [employer]

## 2022-07-05 NOTE — Telephone Encounter (Signed)
Copied from [place] [id number]. Topic: General - Other >> Jul 05, 2022  8:36 AM Everette C wrote: Reason for CRM: The patient's daughter Almyra Free would like to speak with a member of clinical staff when possible  Almyra Free would like clarity on the directions for the patient's ciprofloxacin (CIPRO) 500 MG tablet [[id number]]  prescription   Please contact further when possible   Called and LVM asking for patient's daughter to please return my call if she still has questions.

## 2022-07-06 ENCOUNTER — Ambulatory Visit (INDEPENDENT_AMBULATORY_CARE_PROVIDER_SITE_OTHER): Payer: Medicare Other | Admitting: Unknown Physician Specialty

## 2022-07-06 ENCOUNTER — Encounter: Payer: Self-pay | Admitting: Family Medicine

## 2022-07-06 VITALS — BP 152/93 | HR 86 | Temp 98.6°F | Ht <= 58 in | Wt 174.4 lb

## 2022-07-06 DIAGNOSIS — R102 Pelvic and perineal pain: Secondary | ICD-10-CM

## 2022-07-06 DIAGNOSIS — L859 Epidermal thickening, unspecified: Secondary | ICD-10-CM | POA: Diagnosis not present

## 2022-07-06 DIAGNOSIS — R413 Other amnesia: Secondary | ICD-10-CM

## 2022-07-06 LAB — URINALYSIS
Bilirubin, UA: NEGATIVE
Glucose, UA: NEGATIVE
Ketones, UA: NEGATIVE
Leukocytes,UA: NEGATIVE
Nitrite, UA: NEGATIVE
Protein,UA: NEGATIVE
RBC, UA: NEGATIVE
Specific Gravity, UA: 1.02 (ref 1.005–1.030)
Urobilinogen, Ur: 0.2 mg/dL (ref 0.2–1.0)
pH, UA: 5.5 (ref 5.0–7.5)

## 2022-07-06 NOTE — Assessment & Plan Note (Signed)
Easily recognized me.  Discussed not arguing about recent events

## 2022-07-06 NOTE — Telephone Encounter (Signed)
Patient is being seen today in office.

## 2022-07-06 NOTE — Progress Notes (Signed)
BP (!) 152/93   Pulse 86   Temp 98.6 F (37 C) (Oral)   Ht 4' 9.01" (1.448 m)   Wt 174 lb 6.4 oz (79.1 kg)   SpO2 97%   BMI 37.73 kg/m    Subjective:    Patient ID: Sarah Freeman, female    DOB: Apr 18, 1931, 88 y.o.   MRN: [id number]  HPI: Sarah Freeman is a 88 y.o. female  Chief Complaint  Patient presents with   Rash    B/L hands, Daughter states this is the end of her chemo, was prescribed fluorouracil cream for hands for x 6 weeks   Pt with persistent pelvic pain with burning that comes and goes.  Last C&S done on 06/28/2022. She was treated with Cipro for Providencia rettgeri.Received Cipro from Dr. Wynetta Emery which shows sensitivity.  She may have received a second dose. She is on Nitrofurantoin daily for prevention, but unclear if she is taking it daily  Confusion.  Pt with memory issues and sometimes difficult to get a clear history.  She is taking Aricept.    BP a little high today but daughter states it is much better at home.    Rash on hands since taking 5FU cream.  Stopped her treatment, but still has scabbing on hands.  No infection noted.  Pt due to see dermatologist in August.    Relevant past medical, surgical, family and social history reviewed and updated as indicated. Interim medical history since our last visit reviewed. Allergies and medications reviewed and updated.  Review of Systems  Per HPI unless specifically indicated above     Objective:    BP (!) 152/93   Pulse 86   Temp 98.6 F (37 C) (Oral)   Ht 4' 9.01" (1.448 m)   Wt 174 lb 6.4 oz (79.1 kg)   SpO2 97%   BMI 37.73 kg/m   Wt Readings from Last 3 Encounters:  07/06/22 174 lb 6.4 oz (79.1 kg)  06/28/22 175 lb (79.4 kg)  05/10/22 178 lb 6.4 oz (80.9 kg)    Physical Exam Constitutional:      General: She is not in acute distress.    Appearance: Normal appearance. She is well-developed.  HENT:     Head: Normocephalic and atraumatic.  Eyes:     General: Lids are normal. No  scleral icterus.       Right eye: No discharge.        Left eye: No discharge.     Conjunctiva/sclera: Conjunctivae normal.  Neck:     Vascular: No carotid bruit or JVD.  Cardiovascular:     Rate and Rhythm: Normal rate and regular rhythm.     Heart sounds: Normal heart sounds.  Pulmonary:     Effort: Pulmonary effort is normal. No respiratory distress.     Breath sounds: Normal breath sounds.  Abdominal:     Palpations: There is no hepatomegaly or splenomegaly.  Musculoskeletal:        General: Normal range of motion.     Cervical back: Normal range of motion and neck supple.  Skin:    General: Skin is warm and dry.     Coloration: Skin is not pale.     Findings: No rash.     Comments: Bilateral hands with hyperkeratosis  Neurological:     Mental Status: She is alert and oriented to person, place, and time.  Psychiatric:        Behavior: Behavior normal.  Thought Content: Thought content normal.        Judgment: Judgment normal.     Results for orders placed or performed in visit on 07/06/22  Urinalysis  Result Value Ref Range   Specific Gravity, UA 1.020 1.005 - 1.030   pH, UA 5.5 5.0 - 7.5   Color, UA Yellow Yellow   Appearance Ur Clear Clear   Leukocytes,UA Negative Negative   Protein,UA Negative Negative/Trace   Glucose, UA Negative Negative   Ketones, UA Negative Negative   RBC, UA Negative Negative   Bilirubin, UA Negative Negative   Urobilinogen, Ur 0.2 0.2 - 1.0 mg/dL   Nitrite, UA Negative Negative      Assessment & Plan:   Problem List Items Addressed This Visit       Unprioritized   Memory loss    Easily recognized me.  Discussed not arguing about recent events      Other Visit Diagnoses     Pelvic pain    -  Primary   Unclear etiology and not sure if still has UTI.  Urine without pyuria.  Will send cx.     Relevant Orders   Urinalysis (Completed)   Urine Culture   Hyperkeratosis       Bilateral hands.  Recommended vaseline  (allergic to ab ointment) with non-stick gauze and burn net.  Supplies given        Follow up plan: Return if symptoms worsen or fail to improve.

## 2022-07-07 NOTE — Telephone Encounter (Signed)
Patient was seen in office on 07/06/22.

## 2022-07-09 LAB — URINE CULTURE

## 2022-07-13 ENCOUNTER — Encounter: Payer: Self-pay | Admitting: Family Medicine

## 2022-07-13 ENCOUNTER — Other Ambulatory Visit: Payer: Self-pay | Admitting: Family Medicine

## 2022-07-13 MED ORDER — AMOXICILLIN-POT CLAVULANATE 875-125 MG PO TABS: 1.0000 | ORAL_TABLET | Freq: Two times a day (BID) | ORAL | 0 refills | Status: DC

## 2022-07-14 ENCOUNTER — Other Ambulatory Visit: Payer: Self-pay | Admitting: Family Medicine

## 2022-07-14 NOTE — Telephone Encounter (Signed)
Requested Prescriptions  Pending Prescriptions Disp Refills  . levothyroxine (SYNTHROID) 50 MCG tablet [Pharmacy Med Name: LEVOTHYROXINE 0.'05MG'$  (50MCG) TAB] 90 tablet 0    Sig: TAKE 1 TABLET(50 MCG) BY MOUTH AT BEDTIME.     Endocrinology:  Hypothyroid Agents Passed - 07/14/2022  7:10 AM      Passed - TSH in normal range and within 360 days    TSH  Date Value Ref Range Status  06/28/2022 3.090 0.450 - 4.500 uIU/mL Final         Passed - Valid encounter within last 12 months    Recent Outpatient Visits          1 week ago Pelvic pain   [place], NP   2 weeks ago Hypothyroidism, unspecified type   [place], [place], DO   2 months ago Neoplasm of skin   [place], Megan P, DO   2 months ago [place], PA-C   6 months ago Routine general medical examination at a health care facility   [place], [place], DO      Future Appointments            In 2 months Wynetta Emery, Barb Merino, DO [employer], [place]   In 3 months Cylinder, Nicki Reaper, [place]

## 2022-07-15 NOTE — Progress Notes (Signed)
Contacted via MyChart   Good evening, looks like the Augmentin sent in is susceptible to what is growing in Sarah Freeman's urine -- continue this until complete:)

## 2022-07-26 ENCOUNTER — Encounter: Payer: Self-pay | Admitting: Physician Assistant

## 2022-07-26 ENCOUNTER — Ambulatory Visit (INDEPENDENT_AMBULATORY_CARE_PROVIDER_SITE_OTHER): Payer: Medicare Other | Admitting: Physician Assistant

## 2022-07-26 VITALS — BP 117/75 | HR 75 | Temp 98.4°F | Wt 174.6 lb

## 2022-07-26 DIAGNOSIS — R309 Painful micturition, unspecified: Secondary | ICD-10-CM

## 2022-07-26 DIAGNOSIS — N3000 Acute cystitis without hematuria: Secondary | ICD-10-CM

## 2022-07-26 DIAGNOSIS — R102 Pelvic and perineal pain: Secondary | ICD-10-CM | POA: Diagnosis not present

## 2022-07-26 LAB — URINALYSIS, ROUTINE W REFLEX MICROSCOPIC
Bilirubin, UA: NEGATIVE
Glucose, UA: NEGATIVE
Ketones, UA: NEGATIVE
Nitrite, UA: NEGATIVE
Protein,UA: NEGATIVE
RBC, UA: NEGATIVE
Specific Gravity, UA: 1.015 (ref 1.005–1.030)
Urobilinogen, Ur: 0.2 mg/dL (ref 0.2–1.0)
pH, UA: 5.5 (ref 5.0–7.5)

## 2022-07-26 LAB — WET PREP FOR TRICH, YEAST, CLUE
Clue Cell Exam: NEGATIVE
Trichomonas Exam: NEGATIVE
Yeast Exam: NEGATIVE

## 2022-07-26 LAB — MICROSCOPIC EXAMINATION: Bacteria, UA: NONE SEEN

## 2022-07-26 NOTE — Patient Instructions (Signed)
I'd recommend moving your Urology apt up so they can see you sooner as you have been having more frequent UTIs lately despite antibiotics for prevention and appropriate treatment.

## 2022-07-26 NOTE — Progress Notes (Signed)
Acute Office Visit   Patient: Sarah Freeman   DOB: 1931-02-17   88 y.o. Female  MRN: [id number] Visit Date: 07/26/2022  Today's healthcare provider: Dani Gobble Bryley Chrisman, PA-C  Introduced myself to the patient as a Journalist, newspaper and provided education on APPs in clinical practice.    Chief Complaint  Patient presents with   Urinary Tract Infection    Pt states she thinks she has another UTI or the last one did not clear up. States that she just finished Augmentin on Friday. Prescribed on 07/13/22.    Subjective    Urinary Tract Infection  Pertinent negatives include no chills, flank pain, frequency, hematuria or urgency.   HPI     Urinary Tract Infection    Additional comments: Pt states she thinks she has another UTI or the last one did not clear up. States that she just finished Augmentin on Friday. Prescribed on 07/13/22.       Last edited by Georgina Peer, CMA on 07/26/2022 10:07 AM.       Reports she has suprapubic abdominal pain - does not seem to be dependent on urination Reports it seemed to be getting better earlier last week but then through Friday and the weekend started to get worse Finished last abx on Friday   Daughter is here with her to help with HPI- daughter states she is taking daily Nitrofurantoin for UTI prevention, patient states she is not taking this but has hx of memory issues  Pt Reports this seems to be coming and going - not sure if each UTI fully clears Daughter and pt denies AZO use   Unsure if Pyridium was beneficial for previous pain -   Medications: Outpatient Medications Prior to Visit  Medication Sig   Cholecalciferol (VITAMIN D3) 10 MCG (400 UNIT) CAPS    clopidogrel (PLAVIX) 75 MG tablet Take 1 tablet (75 mg total) by mouth daily.   donepezil (ARICEPT) 5 MG tablet Take 1 tablet (5 mg total) by mouth at bedtime.   esomeprazole (NEXIUM) 40 MG capsule Take 1 capsule (40 mg total) by mouth daily.   fluorouracil (EFUDEX) 5 % cream  SMARTSIG:sparingly Topical Twice Daily   gabapentin (NEURONTIN) 100 MG capsule TAKE 2 CAPSULE(100 MG) BY MOUTH IN THE AM and 1 AT PM   levothyroxine (SYNTHROID) 50 MCG tablet TAKE 1 TABLET(50 MCG) BY MOUTH AT BEDTIME.   nitrofurantoin, macrocrystal-monohydrate, (MACROBID) 100 MG capsule Take 1 capsule (100 mg total) by mouth at bedtime.   polyethylene glycol (MIRALAX / GLYCOLAX) 17 g packet Take 17 g by mouth daily.   PROLIA 60 MG/ML SOSY injection Inject 60 mg into the skin every 6 (six) months.   [DISCONTINUED] amoxicillin-clavulanate (AUGMENTIN) 875-125 MG tablet Take 1 tablet by mouth 2 (two) times daily.   No facility-administered medications prior to visit.    Review of Systems  Constitutional:  Negative for chills, diaphoresis and fever.  Gastrointestinal:  Positive for abdominal pain.  Genitourinary:  Positive for vaginal pain (reports some discomfort while urinating and afterwards). Negative for difficulty urinating, dysuria, enuresis, flank pain, frequency, hematuria, urgency and vaginal discharge.  Neurological:  Negative for dizziness, light-headedness and headaches.       Objective    BP 117/75   Pulse 75   Temp 98.4 F (36.9 C) (Oral)   Wt 174 lb 9.6 oz (79.2 kg)   SpO2 99%   BMI 37.77 kg/m    Physical Exam Vitals reviewed.  Constitutional:      General: She is awake.     Appearance: Normal appearance. She is well-developed and well-groomed. She is obese.  HENT:     Head: Normocephalic and atraumatic.     Mouth/Throat:     Lips: Pink.     Mouth: Mucous membranes are moist.  Cardiovascular:     Rate and Rhythm: Normal rate and regular rhythm.     Pulses: Normal pulses.     Heart sounds: Normal heart sounds. No murmur heard.    No friction rub. No gallop.  Pulmonary:     Effort: Pulmonary effort is normal.     Breath sounds: Normal breath sounds. No decreased air movement. No decreased breath sounds, wheezing, rhonchi or rales.  Abdominal:     General:  Abdomen is protuberant. Bowel sounds are normal.     Palpations: Abdomen is soft.     Tenderness: There is abdominal tenderness in the suprapubic area. There is no right CVA tenderness or left CVA tenderness.  Musculoskeletal:     Cervical back: Normal range of motion and neck supple.  Neurological:     Mental Status: She is alert.  Psychiatric:        Attention and Perception: Attention and perception normal.        Mood and Affect: Mood normal.        Speech: Speech normal.        Behavior: Behavior normal. Behavior is cooperative.        Cognition and Memory: Memory is impaired.        Judgment: Judgment is impulsive.       No results found for any visits on 07/26/22.  Assessment & Plan      No follow-ups on file.       Problem List Items Addressed This Visit   None Visit Diagnoses     Pain with urination    -  Primary Acute, recurrent concern Wet prep and UA were overall normal- will send urine for culture due to her past hx of UTI despite clear UA Results to dictate further management  Recommend that she and daughter move up her Urology apt for evaluation as she has been having more frequent UTIs the past few months Follow up as needed    Relevant Orders   Urinalysis, Routine w reflex microscopic   WET PREP FOR TRICH, YEAST, CLUE   Suprapubic pain    Acute, recurrent concern Wet prep and UA were normal today-will send for culture to completely rule out UTI given her history  Recommend she continue with prophylactic Macrobid as directed to help reduce UTI recurrence Recommend she make Urology apt sooner for eval since she continues to develop UTIs more frequently  Results of urine culture to further dictate management Follow up as needed.      Relevant Orders   WET PREP FOR [place], YEAST, CLUE        No follow-ups on file.   I, Jimi Schappert E Keona Sheffler, PA-C, have reviewed all documentation for this visit. The documentation on 07/26/22 for the exam, diagnosis,  procedures, and orders are all accurate and complete.   Talitha Givens, MHS, PA-C [facility]

## 2022-07-29 ENCOUNTER — Encounter: Payer: Self-pay | Admitting: Physician Assistant

## 2022-07-29 LAB — URINE CULTURE

## 2022-07-29 MED ORDER — SULFAMETHOXAZOLE-TRIMETHOPRIM 800-160 MG PO TABS: 1.0000 | ORAL_TABLET | Freq: Two times a day (BID) | ORAL | 0 refills | Status: AC

## 2022-07-29 NOTE — Addendum Note (Signed)
Addended by: Talitha Givens on: 07/29/2022 08:58 AM   Modules accepted: Orders

## 2022-07-30 ENCOUNTER — Other Ambulatory Visit: Payer: Self-pay | Admitting: Physician Assistant

## 2022-07-30 DIAGNOSIS — R300 Dysuria: Secondary | ICD-10-CM

## 2022-08-03 NOTE — Telephone Encounter (Signed)
Requested Prescriptions  Pending Prescriptions Disp Refills  . phenazopyridine (PYRIDIUM) 100 MG tablet [Pharmacy Med Name: PHENAZOPYRIDINE 100MG  TABLETS] 12 tablet 0    Sig: TAKE 1 TABLET(100 MG) BY MOUTH THREE TIMES DAILY AS NEEDED FOR PAIN     Not Delegated - Urology:  Bladder Analgesics Failed - 07/30/2022  3:33 PM      Failed - This refill cannot be delegated      Passed - Cr in normal range and within 360 days    Creatinine, Ser  Date Value Ref Range Status  06/28/2022 0.95 0.57 - 1.00 mg/dL Final         Passed - eGFR is 50 or above and within 360 days    GFR calc Af Amer  Date Value Ref Range Status  06/16/2020 77 >59 mL/min/1.73 Final    Comment:    **[facility] currently reports eGFR in compliance with the current**   recommendations of the [employer]. [facility] will   update reporting as new guidelines are published from the NKF-ASN   Task force.    GFR calc non Af Amer  Date Value Ref Range Status  06/16/2020 67 >59 mL/min/1.73 Final   eGFR  Date Value Ref Range Status  06/28/2022 57 (L) >59 mL/min/1.73 Final         Passed - Patient is not pregnant      Passed - Valid encounter within last 12 months    Recent Outpatient Visits          1 week ago Pain with urination   [place], Erin E, PA-C   4 weeks ago Pelvic pain   [place], NP   1 month ago Hypothyroidism, unspecified type   [place], [place], DO   2 months ago Neoplasm of skin   [place], DO   3 months ago [place], Dani Gobble, PA-C      Future Appointments            In 6 days MacDiarmid, Nicki Reaper, MD [place]   In 1 month [place], Barb Merino, DO [employer], [place]

## 2022-08-09 ENCOUNTER — Ambulatory Visit (INDEPENDENT_AMBULATORY_CARE_PROVIDER_SITE_OTHER): Payer: Medicare Other | Admitting: Urology

## 2022-08-09 ENCOUNTER — Encounter: Payer: Self-pay | Admitting: Urology

## 2022-08-09 VITALS — BP 114/71 | HR 82 | Ht 62.0 in | Wt 174.0 lb

## 2022-08-09 DIAGNOSIS — N3020 Other chronic cystitis without hematuria: Secondary | ICD-10-CM | POA: Diagnosis not present

## 2022-08-09 LAB — URINALYSIS, COMPLETE
Bilirubin, UA: NEGATIVE
Glucose, UA: NEGATIVE
Nitrite, UA: NEGATIVE
RBC, UA: NEGATIVE
Specific Gravity, UA: 1.015 (ref 1.005–1.030)
Urobilinogen, Ur: 0.2 mg/dL (ref 0.2–1.0)
pH, UA: 5.5 (ref 5.0–7.5)

## 2022-08-09 LAB — MICROSCOPIC EXAMINATION

## 2022-08-09 MED ORDER — CEPHALEXIN 250 MG PO CAPS: 250.0000 mg | ORAL_CAPSULE | Freq: Two times a day (BID) | ORAL | 0 refills | Status: AC

## 2022-08-09 NOTE — Progress Notes (Signed)
08/09/2022 8:45 AM   Deondra Anson [place] Oct 12, 1931 [id number]  Referring provider: Valerie Roys, DO [place],  [place] [address]  Chief Complaint  Patient presents with   Follow-up   Recurrent UTI    HPI: I  was consulted to assess the patient's worsening urinary incontinence over many months. She leaks with coughing sneezing bending and lifting.  She has urge incontinence and no bedwetting.  She wears 1 pad a day.  She will wear 2 pads if active.  She soaks more urine if she has urgency.   She voids 4 or 5 times at night.  She voids every 2 hours during the day.     Myrbetriq did not help.   Patient had a positive urine culture in January and February 2019.  Recent renal ultrasound was normal The patient was started on trimethoprim and was also given Vesicare.   When I saw her last time she was infection free and did not want to go back on Vesicare for persisting urge incontinence.  Overall it was improved.  she has been having cardiac issues   The history was a little bit difficult bladder and the patient is on daily trimethoprim but twice in the last year has had bladder infections treated with dysuria and achiness.  It looks like she is currently on Macrodantin for infection.     I could not find any cultures in the medical record in the last many month     Patient may or may not be having breakthrough infections but her symptoms do sound like cystitis that generally respond to an antibiotic.  I thought it was reasonable to change her to daily Macrodantin and have her come back in 4 months and check on her.  I would call her if the urine culture today is positive   When I saw the patient in August 2021 her culture was negative.  She just finished Bactrim but her culture from October 26, 2021 was negative.   She was having suprapubic discomfort that got better on Bactrim.  She understands if culture is negative.  Clinically not infected today but urine sent for  culture  Patient probably did not have a true breakthrough infection.  90x3 Macrodantin sent to pharmacy and I will see in 1 year  TOday Once again history was challenging.  It appears the patient has had breakthrough infections on the daily Macrodantin.  She gets discomfort in the suprapubic area.  She has been on a lot of antibiotics  She has had multiple positive cultures  Clinically not infected today.  Frequency stable         PMH: Past Medical History:  Diagnosis Date   Hyperlipidemia    Hypothyroid    Stroke [facility])    Urinary incontinence     Surgical History: Past Surgical History:  Procedure Laterality Date   BREAST BIOPSY Left 2000?   neg. dr. Dwyane Luo office   CHOLECYSTECTOMY  1964   COLONOSCOPY     Dr Nicolasa Ducking   DILATION AND CURETTAGE OF UTERUS     EYE SURGERY     LOOP RECORDER INSERTION N/A 02/09/2018   Procedure: LOOP RECORDER INSERTION;  Surgeon: Deboraha Sprang, MD;  Location: [facility];  Service: Cardiovascular;  Laterality: N/A;   SKIN CANCER EXCISION     face   TEE WITHOUT CARDIOVERSION N/A 02/09/2018   Procedure: TRANSESOPHAGEAL ECHOCARDIOGRAM (TEE);  Surgeon: Minna Merritts, MD;  Location: [facility] OR[facility];  Service: Cardiovascular;  Laterality: N/A;   TOTAL ABDOMINAL HYSTERECTOMY W/ BILATERAL SALPINGOOPHORECTOMY     age 88    Home Medications:  Allergies as of 08/09/2022       Reactions   Statins Other (See Comments)   Leg cramps Leg cramps   Bacitracin    Neomycin    Neosporin + Pain Relief Max St  [neomy-bacit-polymyx-pramoxine]    Polymyxin B    Neomycin-polymyxin-gramicidin Other (See Comments)   Pt states wounds do not heal with neosporin Pt states wounds do not heal with neosporin        Medication List        Accurate as of August 09, 2022  8:45 AM. If you have any questions, ask your nurse or doctor.          STOP taking these medications    fluorouracil 5 % cream Commonly known as: EFUDEX Stopped by: Reece Packer, MD       TAKE these medications    clopidogrel 75 MG tablet Commonly known as: PLAVIX Take 1 tablet (75 mg total) by mouth daily.   donepezil 5 MG tablet Commonly known as: ARICEPT Take 1 tablet (5 mg total) by mouth at bedtime.   esomeprazole 40 MG capsule Commonly known as: NEXIUM Take 1 capsule (40 mg total) by mouth daily.   gabapentin 100 MG capsule Commonly known as: NEURONTIN TAKE 2 CAPSULE(100 MG) BY MOUTH IN THE AM and 1 AT PM   levothyroxine 50 MCG tablet Commonly known as: SYNTHROID TAKE 1 TABLET(50 MCG) BY MOUTH AT BEDTIME.   Macrobid 100 MG capsule Generic drug: nitrofurantoin (macrocrystal-monohydrate) Take 1 capsule (100 mg total) by mouth at bedtime.   polyethylene glycol 17 g packet Commonly known as: MIRALAX / GLYCOLAX Take 17 g by mouth daily.   Prolia 60 MG/ML Sosy injection Generic drug: denosumab Inject 60 mg into the skin every 6 (six) months.   Vitamin D3 10 MCG (400 UNIT) Caps        Allergies:  Allergies  Allergen Reactions   Statins Other (See Comments)    Leg cramps Leg cramps   Bacitracin    Neomycin    Neosporin + Pain Relief Max St  [Neomy-Bacit-Polymyx-Pramoxine]    Polymyxin B    Neomycin-Polymyxin-Gramicidin Other (See Comments)    Pt states wounds do not heal with neosporin Pt states wounds do not heal with neosporin    Family History: Family History  Problem Relation Age of Onset   Cancer Father        prostate   Stroke Father    Hypertension Sister    Hyperlipidemia Daughter    Heart disease Son    Diabetes Maternal Grandmother    Heart disease Maternal Grandmother    Heart disease Maternal Grandfather    Heart disease Daughter    CAD Mother    Failure to thrive Mother    Breast cancer Neg Hx     Social History:  reports that she has never smoked. She has never been exposed to tobacco smoke. She has never used smokeless tobacco. She reports that she does not drink alcohol and does not use  drugs.  ROS:                                        Physical Exam: BP 114/71   Pulse 82   Ht '5\' 2"'$  (1.575  m)   Wt 78.9 kg   BMI 31.83 kg/m   Constitutional:  Alert and oriented, No acute distress. HEENT: [place] AT, moist mucus membranes.  Trachea midline, no masses.   Laboratory Data: Lab Results  Component Value Date   WBC 7.3 06/28/2022   HGB 12.9 06/28/2022   HCT 40.0 06/28/2022   MCV 99 (H) 06/28/2022   PLT 325 06/28/2022    Lab Results  Component Value Date   CREATININE 0.95 06/28/2022    No results found for: "PSA"  No results found for: "TESTOSTERONE"  Lab Results  Component Value Date   HGBA1C 6.1 (H) 01/18/2018    Urinalysis    Component Value Date/Time   COLORURINE YELLOW (A) 01/18/2018 1925   APPEARANCEUR Clear 07/26/2022 1005   LABSPEC 1.006 01/18/2018 1925   PHURINE 7.0 01/18/2018 1925   GLUCOSEU Negative 07/26/2022 1005   HGBUR NEGATIVE 01/18/2018 1925   BILIRUBINUR Negative 07/26/2022 [place] 01/18/2018 1925   PROTEINUR Negative 07/26/2022 [place] 01/18/2018 1925   NITRITE Negative 07/26/2022 1005   NITRITE NEGATIVE 01/18/2018 1925   LEUKOCYTESUR 2+ (A) 07/26/2022 1005    Pertinent Imaging:   Assessment & Plan: Patient is having positive cultures with breakthrough infections on daily Macrodantin.  Send urine for culture.  Return in 8 weeks on daily Keflex.  She may have been having breakthrough infections on trimethoprim but we could always try it again.  Treatment options are becoming more limited  1. Chronic cystitis  - Urinalysis, Complete   No follow-ups on file.  Reece Packer, MD  [facility] [address], [place], [place] [address] [phone]

## 2022-08-12 LAB — CULTURE, URINE COMPREHENSIVE

## 2022-09-28 ENCOUNTER — Encounter: Payer: Self-pay | Admitting: Family Medicine

## 2022-09-28 ENCOUNTER — Ambulatory Visit (INDEPENDENT_AMBULATORY_CARE_PROVIDER_SITE_OTHER): Payer: Medicare Other | Admitting: Family Medicine

## 2022-09-28 VITALS — BP 115/72 | HR 78 | Temp 97.5°F | Wt 170.0 lb

## 2022-09-28 DIAGNOSIS — Z23 Encounter for immunization: Secondary | ICD-10-CM

## 2022-09-28 DIAGNOSIS — R413 Other amnesia: Secondary | ICD-10-CM

## 2022-09-28 MED ORDER — LEVOTHYROXINE SODIUM 50 MCG PO TABS
50.0000 ug | ORAL_TABLET | Freq: Every day | ORAL | 3 refills | Status: DC
Start: 2022-09-28 — End: 2023-01-10

## 2022-09-28 NOTE — Assessment & Plan Note (Signed)
Continue aricept. Declines any additional medication for sleep or mood. Continue current regimen. Call with any concerns.

## 2022-09-28 NOTE — Progress Notes (Signed)
BP 115/72   Pulse 78   Temp (!) 97.5 F (36.4 C)   Wt 170 lb (77.1 kg)   SpO2 97%   BMI 31.09 kg/m    Subjective:    Patient ID: Sarah Freeman, female    DOB: 1931-04-17, 88 y.o.   MRN: [id number]  HPI: Sarah Freeman is a 88 y.o. female  Chief Complaint  Patient presents with   Memory Loss    Patient started on aricept last visit    Tolerating her aricept well. Feeling well. No concerns. Has been pretty irritable, but doesn't want to take anything for it. No concerns with side effects. Otherwise feeling well.   Relevant past medical, surgical, family and social history reviewed and updated as indicated. Interim medical history since our last visit reviewed. Allergies and medications reviewed and updated.  Review of Systems  Constitutional: Negative.   Respiratory: Negative.    Cardiovascular: Negative.   Gastrointestinal: Negative.   Musculoskeletal: Negative.   Psychiatric/Behavioral:  Positive for agitation. Negative for behavioral problems, confusion, decreased concentration, dysphoric mood, hallucinations, self-injury, sleep disturbance and suicidal ideas. The patient is not nervous/anxious and is not hyperactive.     Per HPI unless specifically indicated above     Objective:    BP 115/72   Pulse 78   Temp (!) 97.5 F (36.4 C)   Wt 170 lb (77.1 kg)   SpO2 97%   BMI 31.09 kg/m   Wt Readings from Last 3 Encounters:  09/28/22 170 lb (77.1 kg)  08/09/22 174 lb (78.9 kg)  07/26/22 174 lb 9.6 oz (79.2 kg)    Physical Exam Vitals and nursing note reviewed.  Constitutional:      General: She is not in acute distress.    Appearance: Normal appearance. She is not ill-appearing, toxic-appearing or diaphoretic.  HENT:     Head: Normocephalic and atraumatic.     Right Ear: External ear normal.     Left Ear: External ear normal.     Nose: Nose normal.     Mouth/Throat:     Mouth: Mucous membranes are moist.     Pharynx: Oropharynx is clear.  Eyes:      General: No scleral icterus.       Right eye: No discharge.        Left eye: No discharge.     Extraocular Movements: Extraocular movements intact.     Conjunctiva/sclera: Conjunctivae normal.     Pupils: Pupils are equal, round, and reactive to light.  Cardiovascular:     Rate and Rhythm: Normal rate and regular rhythm.     Pulses: Normal pulses.     Heart sounds: Normal heart sounds. No murmur heard.    No friction rub. No gallop.  Pulmonary:     Effort: Pulmonary effort is normal. No respiratory distress.     Breath sounds: Normal breath sounds. No stridor. No wheezing, rhonchi or rales.  Chest:     Chest wall: No tenderness.  Musculoskeletal:        General: Normal range of motion.     Cervical back: Normal range of motion and neck supple.  Skin:    General: Skin is warm and dry.     Capillary Refill: Capillary refill takes less than 2 seconds.     Coloration: Skin is not jaundiced or pale.     Findings: No bruising, erythema, lesion or rash.  Neurological:     General: No focal deficit present.  Mental Status: She is alert and oriented to person, place, and time. Mental status is at baseline.  Psychiatric:        Mood and Affect: Mood normal.        Behavior: Behavior normal.        Thought Content: Thought content normal.        Judgment: Judgment normal.     Results for orders placed or performed in visit on 08/09/22  CULTURE, URINE COMPREHENSIVE   Specimen: Urine   UR  Result Value Ref Range   Urine Culture, Comprehensive Final report    Organism ID, Bacteria Comment   Microscopic Examination   Urine  Result Value Ref Range   WBC, UA 11-30 (A) 0 - 5 /hpf   RBC, Urine 0-2 0 - 2 /hpf   Epithelial Cells (non renal) 0-10 0 - 10 /hpf   Renal Epithel, UA 0-10 (A) None seen /hpf   Bacteria, UA Moderate (A) None seen/Few  Urinalysis, Complete  Result Value Ref Range   Specific Gravity, UA 1.015 1.005 - 1.030   pH, UA 5.5 5.0 - 7.5   Color, UA Yellow Yellow    Appearance Ur Clear Clear   Leukocytes,UA 1+ (A) Negative   Protein,UA 1+ (A) Negative/Trace   Glucose, UA Negative Negative   Ketones, UA Trace (A) Negative   RBC, UA Negative Negative   Bilirubin, UA Negative Negative   Urobilinogen, Ur 0.2 0.2 - 1.0 mg/dL   Nitrite, UA Negative Negative   Microscopic Examination See below:       Assessment & Plan:   Problem List Items Addressed This Visit       Other   Memory loss - Primary    Continue aricept. Declines any additional medication for sleep or mood. Continue current regimen. Call with any concerns.       Other Visit Diagnoses     Needs flu shot       Flu shot given today.   Relevant Orders   Flu Vaccine QUAD High Dose(Fluad) (Completed)        Follow up plan: Return in about 3 months (around 12/29/2022) for physical (after 12/30/22).  15 minutes spent with patient and daughter today

## 2022-11-08 ENCOUNTER — Ambulatory Visit (INDEPENDENT_AMBULATORY_CARE_PROVIDER_SITE_OTHER): Payer: Medicare Other | Admitting: Urology

## 2022-11-08 ENCOUNTER — Ambulatory Visit: Payer: Medicare Other | Admitting: Urology

## 2022-11-08 ENCOUNTER — Encounter: Payer: Self-pay | Admitting: Urology

## 2022-11-08 VITALS — BP 134/82 | HR 82 | Ht <= 58 in | Wt 170.0 lb

## 2022-11-08 DIAGNOSIS — N3020 Other chronic cystitis without hematuria: Secondary | ICD-10-CM | POA: Diagnosis not present

## 2022-11-08 LAB — MICROSCOPIC EXAMINATION

## 2022-11-08 LAB — URINALYSIS, COMPLETE
Bilirubin, UA: NEGATIVE
Glucose, UA: NEGATIVE
Ketones, UA: NEGATIVE
Nitrite, UA: NEGATIVE
Protein,UA: NEGATIVE
RBC, UA: NEGATIVE
Specific Gravity, UA: 1.01 (ref 1.005–1.030)
Urobilinogen, Ur: 0.2 mg/dL (ref 0.2–1.0)
pH, UA: 6.5 (ref 5.0–7.5)

## 2022-11-08 MED ORDER — NITROFURANTOIN MONOHYD MACRO 100 MG PO CAPS: 100.0000 mg | ORAL_CAPSULE | Freq: Every day | ORAL | 3 refills | Status: DC

## 2022-11-08 NOTE — Progress Notes (Signed)
11/08/2022 8:49 AM   Sarah Freeman 21-Oct-1931 [id number]  Referring provider: Valerie Roys, DO [place],  [place] [address]  No chief complaint on file.   HPI: I  was consulted to assess the patient's worsening urinary incontinence over many months. She leaks with coughing sneezing bending and lifting.  She has urge incontinence and no bedwetting.  She wears 1 pad a day.  She will wear 2 pads if active.  She soaks more urine if she has urgency.   She voids 4 or 5 times at night.  She voids every 2 hours during the day.   Myrbetriq did not help.   Patient had a positive urine culture in January and February 2019.  Recent renal ultrasound was normal The patient was started on trimethoprim and was also given Vesicare.   When I saw her last time she was infection free and did not want to go back on Vesicare for persisting urge incontinence.  Overall it was improved.  she has been having cardiac issues   The history was a little bit difficult bladder and the patient is on daily trimethoprim but twice in the last year has had bladder infections treated with dysuria and achiness.  It looks like she is currently on Macrodantin for infection.     I could not find any cultures in the medical record in the last many month     Patient may or may not be having breakthrough infections but her symptoms do sound like cystitis that generally respond to an antibiotic.  I thought it was reasonable to change her to daily Macrodantin and have her come back in 4 months and check on her.  I would call her if the urine culture today is positive   When I saw the patient in August 2021 her culture was negative.  She just finished Bactrim but her culture from October 26, 2021 was negative.   She was having suprapubic discomfort that got better on Bactrim.  She understands if culture is negative.  Clinically not infected today but urine sent for culture   Patient probably did not have a true  breakthrough infection.  90x3 Macrodantin sent to pharmacy and I will see in 1 year   Once again history was challenging.  It appears the patient has had breakthrough infections on the daily Macrodantin.  She gets discomfort in the suprapubic area.  She has been on a lot of antibiotics   She has had multiple positive cultures   Clinically not infected today.  Frequency stable    Patient is having positive cultures with breakthrough infections on daily Macrodantin. Send urine for culture. Return in 8 weeks on daily Keflex. She may have been having breakthrough infections on trimethoprim but we could always try it again. Treatment options are becoming more limited   Today\ Grossly stable.  Last culture negative It turns out the patient was on Keflex for a month but is now back on daily Macrobid and has been doing clinically excellent for 3 months.  Frequency stable   PMH: Past Medical History:  Diagnosis Date   Hyperlipidemia    Hypothyroid    Stroke [facility])    Urinary incontinence     Surgical History: Past Surgical History:  Procedure Laterality Date   BREAST BIOPSY Left 2000?   neg. dr. Dwyane Luo office   CHOLECYSTECTOMY  1964   COLONOSCOPY     Dr Max Sane AND CURETTAGE OF UTERUS  EYE SURGERY     LOOP RECORDER INSERTION N/A 02/09/2018   Procedure: LOOP RECORDER INSERTION;  Surgeon: Deboraha Sprang, MD;  Location: [facility];  Service: Cardiovascular;  Laterality: N/A;   SKIN CANCER EXCISION     face   TEE WITHOUT CARDIOVERSION N/A 02/09/2018   Procedure: TRANSESOPHAGEAL ECHOCARDIOGRAM (TEE);  Surgeon: Minna Merritts, MD;  Location: [facility];  Service: Cardiovascular;  Laterality: N/A;   TOTAL ABDOMINAL HYSTERECTOMY W/ BILATERAL SALPINGOOPHORECTOMY     age 88    Home Medications:  Allergies as of 11/08/2022       Reactions   Statins Other (See Comments)   Leg cramps Leg cramps   Bacitracin    Neomycin    Neosporin + Pain Relief Max St   [neomy-bacit-polymyx-pramoxine]    Polymyxin B    Neomycin-polymyxin-gramicidin Other (See Comments)   Pt states wounds do not heal with neosporin Pt states wounds do not heal with neosporin        Medication List        Accurate as of November 08, 2022  8:49 AM. If you have any questions, ask your nurse or doctor.          clopidogrel 75 MG tablet Commonly known as: PLAVIX Take 1 tablet (75 mg total) by mouth daily.   donepezil 5 MG tablet Commonly known as: ARICEPT Take 1 tablet (5 mg total) by mouth at bedtime.   esomeprazole 40 MG capsule Commonly known as: NEXIUM Take 1 capsule (40 mg total) by mouth daily.   gabapentin 100 MG capsule Commonly known as: NEURONTIN TAKE 2 CAPSULE(100 MG) BY MOUTH IN THE AM and 1 AT PM   levothyroxine 50 MCG tablet Commonly known as: SYNTHROID Take 1 tablet (50 mcg total) by mouth daily before breakfast.   Macrobid 100 MG capsule Generic drug: nitrofurantoin (macrocrystal-monohydrate) Take 1 capsule (100 mg total) by mouth at bedtime.   polyethylene glycol 17 g packet Commonly known as: MIRALAX / GLYCOLAX Take 17 g by mouth daily.   Prolia 60 MG/ML Sosy injection Generic drug: denosumab Inject 60 mg into the skin every 6 (six) months.   Vitamin D3 10 MCG (400 UNIT) Caps        Allergies:  Allergies  Allergen Reactions   Statins Other (See Comments)    Leg cramps Leg cramps   Bacitracin    Neomycin    Neosporin + Pain Relief Max St  [Neomy-Bacit-Polymyx-Pramoxine]    Polymyxin B    Neomycin-Polymyxin-Gramicidin Other (See Comments)    Pt states wounds do not heal with neosporin Pt states wounds do not heal with neosporin    Family History: Family History  Problem Relation Age of Onset   Cancer Father        prostate   Stroke Father    Hypertension Sister    Hyperlipidemia Daughter    Heart disease Son    Diabetes Maternal Grandmother    Heart disease Maternal Grandmother    Heart disease Maternal  Grandfather    Heart disease Daughter    CAD Mother    Failure to thrive Mother    Breast cancer Neg Hx     Social History:  reports that she has never smoked. She has never been exposed to tobacco smoke. She has never used smokeless tobacco. She reports that she does not drink alcohol and does not use drugs.  ROS:  Physical Exam: There were no vitals taken for this visit.  Constitutional:  Alert and oriented, No acute distress. HEENT: [place] AT, moist mucus membranes.  Trachea midline, no masses.   Laboratory Data: Lab Results  Component Value Date   WBC 7.3 06/28/2022   HGB 12.9 06/28/2022   HCT 40.0 06/28/2022   MCV 99 (H) 06/28/2022   PLT 325 06/28/2022    Lab Results  Component Value Date   CREATININE 0.95 06/28/2022    No results found for: "PSA"  No results found for: "TESTOSTERONE"  Lab Results  Component Value Date   HGBA1C 6.1 (H) 01/18/2018    Urinalysis    Component Value Date/Time   COLORURINE YELLOW (A) 01/18/2018 1925   APPEARANCEUR Clear 08/09/2022 0835   LABSPEC 1.006 01/18/2018 1925   PHURINE 7.0 01/18/2018 1925   GLUCOSEU Negative 08/09/2022 0835   HGBUR NEGATIVE 01/18/2018 1925   BILIRUBINUR Negative 08/09/2022 [place] 01/18/2018 1925   PROTEINUR 1+ (A) 08/09/2022 0835   PROTEINUR NEGATIVE 01/18/2018 1925   NITRITE Negative 08/09/2022 0835   NITRITE NEGATIVE 01/18/2018 1925   LEUKOCYTESUR 1+ (A) 08/09/2022 0835    Pertinent Imaging:   Assessment & Plan: Recognizing potential limitations I decided to keep her on Macrobid 100 mg daily 90 x 3.  We can always go to long-term daily Keflex in the future if needed.  I will see her in a year  1. Chronic cystitis    No follow-ups on file.  Reece Packer, MD  [facility] [address] [place], [place] [address] [phone]

## 2022-11-09 ENCOUNTER — Encounter: Payer: Self-pay | Admitting: Urology

## 2022-11-11 ENCOUNTER — Other Ambulatory Visit: Payer: Self-pay | Admitting: *Deleted

## 2022-11-11 LAB — CULTURE, URINE COMPREHENSIVE

## 2022-11-11 MED ORDER — CIPROFLOXACIN HCL 250 MG PO TABS: 250.0000 mg | ORAL_TABLET | Freq: Two times a day (BID) | ORAL | 0 refills | Status: AC

## 2022-12-31 ENCOUNTER — Encounter: Payer: Medicare Other | Admitting: Family Medicine

## 2023-01-04 ENCOUNTER — Other Ambulatory Visit: Payer: Self-pay | Admitting: Family Medicine

## 2023-01-04 ENCOUNTER — Ambulatory Visit (INDEPENDENT_AMBULATORY_CARE_PROVIDER_SITE_OTHER): Payer: Medicare Other | Admitting: Family Medicine

## 2023-01-04 ENCOUNTER — Encounter: Payer: Self-pay | Admitting: Family Medicine

## 2023-01-04 VITALS — BP 119/74 | HR 83 | Temp 97.4°F | Ht <= 58 in | Wt 171.4 lb

## 2023-01-04 DIAGNOSIS — Z789 Other specified health status: Secondary | ICD-10-CM

## 2023-01-04 DIAGNOSIS — Z0000 Encounter for general adult medical examination without abnormal findings: Secondary | ICD-10-CM

## 2023-01-04 DIAGNOSIS — I700 Atherosclerosis of aorta: Secondary | ICD-10-CM

## 2023-01-04 DIAGNOSIS — K219 Gastro-esophageal reflux disease without esophagitis: Secondary | ICD-10-CM

## 2023-01-04 DIAGNOSIS — E7800 Pure hypercholesterolemia, unspecified: Secondary | ICD-10-CM

## 2023-01-04 DIAGNOSIS — I10 Essential (primary) hypertension: Secondary | ICD-10-CM | POA: Diagnosis not present

## 2023-01-04 DIAGNOSIS — I7090 Unspecified atherosclerosis: Secondary | ICD-10-CM

## 2023-01-04 DIAGNOSIS — N183 Chronic kidney disease, stage 3 unspecified: Secondary | ICD-10-CM

## 2023-01-04 DIAGNOSIS — E039 Hypothyroidism, unspecified: Secondary | ICD-10-CM

## 2023-01-04 DIAGNOSIS — I6523 Occlusion and stenosis of bilateral carotid arteries: Secondary | ICD-10-CM

## 2023-01-04 MED ORDER — CLOPIDOGREL BISULFATE 75 MG PO TABS: 75.0000 mg | ORAL_TABLET | Freq: Every day | ORAL | 1 refills | Status: DC

## 2023-01-04 MED ORDER — ESOMEPRAZOLE MAGNESIUM 40 MG PO CPDR: 40.0000 mg | DELAYED_RELEASE_CAPSULE | Freq: Every day | ORAL | 1 refills | Status: DC

## 2023-01-04 MED ORDER — GABAPENTIN 100 MG PO CAPS: ORAL_CAPSULE | ORAL | 1 refills | Status: DC

## 2023-01-04 MED ORDER — DONEPEZIL HCL 5 MG PO TABS: 5.0000 mg | ORAL_TABLET | Freq: Every day | ORAL | 1 refills | Status: DC

## 2023-01-04 NOTE — Assessment & Plan Note (Signed)
Will keep BP and cholesterol under good control. Continue to monitor. Call with any concerns.  

## 2023-01-04 NOTE — Assessment & Plan Note (Signed)
Rechecking labs today. Await results. Treat as needed.  °

## 2023-01-04 NOTE — Assessment & Plan Note (Signed)
Under good control on current regimen. Continue current regimen. Continue to monitor. Call with any concerns. Refills given. Labs drawn today.   

## 2023-01-04 NOTE — Assessment & Plan Note (Signed)
Will keep BP and cholesterol under good control. Continue to monitor. Call with any concerns.

## 2023-01-04 NOTE — Assessment & Plan Note (Signed)
Unable to tolerate statins. Continue diet and exercise. Call with any concerns.

## 2023-01-04 NOTE — Assessment & Plan Note (Signed)
Unable to tolerate statins. Continue to monitor. Call with any concerns.

## 2023-01-04 NOTE — Assessment & Plan Note (Signed)
Encouraged diet and exercise. Call with any concerns. Continue to monitor.  

## 2023-01-04 NOTE — Progress Notes (Signed)
BP 119/74   Pulse 83   Temp (!) 97.4 F (36.3 C) (Oral)   Ht '4\' 8"'$  (1.422 m)   Wt 171 lb 6.4 oz (77.7 kg)   SpO2 97%   BMI 38.43 kg/m    Subjective:    Patient ID: Sarah Freeman, female    DOB: [birth year], 89 y.o.   MRN: [id number]  HPI: Sarah Freeman is a 89 y.o. female presenting on 01/04/2023 for comprehensive medical examination. Current medical complaints include:  HYPERTENSION / HYPERLIPIDEMIA Satisfied with current treatment? yes Duration of hypertension: chronic BP monitoring frequency: not checking BP medication side effects: no Past BP meds: none Duration of hyperlipidemia: chronic Cholesterol medication side effects: no Cholesterol supplements: none Past cholesterol medications: none Medication compliance: N/A Aspirin: no Recent stressors: no Recurrent headaches: no Visual changes: no Palpitations: no Dyspnea: no Chest pain: no Lower extremity edema: no Dizzy/lightheaded: no  HYPOTHYROIDISM Thyroid control status:controlled Satisfied with current treatment? yes Medication side effects: yes Medication compliance: excellent compliance Recent dose adjustment:no Fatigue: no Cold intolerance: no Heat intolerance: no Weight gain: no Weight loss: no Constipation: no Diarrhea/loose stools: no Palpitations: no Lower extremity edema: no Anxiety/depressed mood: no  GERD GERD control status: controlled Satisfied with current treatment? yes Medication side effects: no  Medication compliance: excellent Dysphagia: no Odynophagia:  no Hematemesis: no Blood in stool: no EGD: no   She currently lives with: alone Menopausal Symptoms: no  Depression Screen done today and results listed below:     01/04/2023   10:04 AM 09/28/2022    9:51 AM 04/30/2022    3:40 PM 01/18/2022    8:32 AM 12/29/2021    9:14 AM  Depression screen PHQ 2/9  Decreased Interest 0 2 2 0 0  Down, Depressed, Hopeless 0 0 0 0 0  PHQ - 2 Score 0 2 2 0 0  Altered sleeping 0 0 1  0 0  Tired, decreased energy 0 1 1 0 0  Change in appetite 0 0 0 0 0  Feeling bad or failure about yourself  0 0 0 0 0  Trouble concentrating 0 0 0 0 0  Moving slowly or fidgety/restless 0 0 0 0 0  Suicidal thoughts 0 0 0 0 0  PHQ-9 Score 0 3 4 0 0  Difficult doing work/chores Not difficult at all Not difficult at all       Past Medical History:  Past Medical History:  Diagnosis Date   Hyperlipidemia    Hypothyroid    Stroke [facility])    Urinary incontinence     Surgical History:  Past Surgical History:  Procedure Laterality Date   BREAST BIOPSY Left 2000?   neg. dr. Dwyane Luo office   CHOLECYSTECTOMY  1964   COLONOSCOPY     Dr Nicolasa Ducking   DILATION AND CURETTAGE OF UTERUS     EYE SURGERY     LOOP RECORDER INSERTION N/A 02/09/2018   Procedure: LOOP RECORDER INSERTION;  Surgeon: Deboraha Sprang, MD;  Location: [facility];  Service: Cardiovascular;  Laterality: N/A;   SKIN CANCER EXCISION     face   TEE WITHOUT CARDIOVERSION N/A 02/09/2018   Procedure: TRANSESOPHAGEAL ECHOCARDIOGRAM (TEE);  Surgeon: Minna Merritts, MD;  Location: [facility];  Service: Cardiovascular;  Laterality: N/A;   TOTAL ABDOMINAL HYSTERECTOMY W/ BILATERAL SALPINGOOPHORECTOMY     age 57    Medications:  Current Outpatient Medications on File Prior to Visit  Medication Sig   Cholecalciferol (VITAMIN D3)  10 MCG (400 UNIT) CAPS    levothyroxine (SYNTHROID) 50 MCG tablet Take 1 tablet (50 mcg total) by mouth daily before breakfast.   nitrofurantoin, macrocrystal-monohydrate, (MACROBID) 100 MG capsule Take 1 capsule (100 mg total) by mouth at bedtime.   polyethylene glycol (MIRALAX / GLYCOLAX) 17 g packet Take 17 g by mouth daily.   PROLIA 60 MG/ML SOSY injection Inject 60 mg into the skin every 6 (six) months.   No current facility-administered medications on file prior to visit.    Allergies:  Allergies  Allergen Reactions   Statins Other (See Comments)    Leg cramps Leg cramps   Bacitracin     Neomycin    Neosporin + Pain Relief Max St  [Neomy-Bacit-Polymyx-Pramoxine]    Polymyxin B    Neomycin-Polymyxin-Gramicidin Other (See Comments)    Pt states wounds do not heal with neosporin Pt states wounds do not heal with neosporin    Social History:  Social History   Socioeconomic History   Marital status: Widowed    Spouse name: Not on file   Number of children: Not on file   Years of education: Not on file   Highest education level: Not on file  Occupational History   Occupation: retired  Tobacco Use   Smoking status: Never    Passive exposure: Never   Smokeless tobacco: Never  Vaping Use   Vaping Use: Never used  Substance and Sexual Activity   Alcohol use: No    Alcohol/week: 0.0 standard drinks of alcohol   Drug use: No   Sexual activity: Not Currently  Other Topics Concern   Not on file  Social History Narrative   Not on file   Social Determinants of Health   Financial Resource Strain: Low Risk  (01/18/2022)   Overall Financial Resource Strain (CARDIA)    Difficulty of Paying Living Expenses: Not hard at all  Food Insecurity: No Food Insecurity (01/18/2022)   Hunger Vital Sign    Worried About Running Out of Food in the Last Year: Never true    [place] in the Last Year: Never true  Transportation Needs: No Transportation Needs (01/18/2022)   PRAPARE - Hydrologist (Medical): No    Lack of Transportation (Non-Medical): No  Physical Activity: Inactive (01/18/2022)   Exercise Vital Sign    Days of Exercise per Week: 0 days    Minutes of Exercise per Session: 0 min  Stress: No Stress Concern Present (01/18/2022)   [place]    Feeling of Stress : Not at all  Social Connections: Moderately Integrated (01/18/2022)   Social Connection and Isolation Panel [NHANES]    Frequency of Communication with Friends and Family: More than three times a week     Frequency of Social Gatherings with Friends and Family: Three times a week    Attends Religious Services: More than 4 times per year    Active Member of Clubs or Organizations: Yes    Attends Archivist Meetings: More than 4 times per year    Marital Status: Widowed  Intimate Partner Violence: Not At Risk (01/18/2022)   Humiliation, Afraid, Rape, and Kick questionnaire    Fear of Current or Ex-Partner: No    Emotionally Abused: No    Physically Abused: No    Sexually Abused: No   Social History   Tobacco Use  Smoking Status Never   Passive exposure: Never  Smokeless  Tobacco Never   Social History   Substance and Sexual Activity  Alcohol Use No   Alcohol/week: 0.0 standard drinks of alcohol    Family History:  Family History  Problem Relation Age of Onset   Cancer Father        prostate   Stroke Father    Hypertension Sister    Hyperlipidemia Daughter    Heart disease Son    Diabetes Maternal Grandmother    Heart disease Maternal Grandmother    Heart disease Maternal Grandfather    Heart disease Daughter    CAD Mother    Failure to thrive Mother    Breast cancer Neg Hx     Past medical history, surgical history, medications, allergies, family history and social history reviewed with patient today and changes made to appropriate areas of the chart.   Review of Systems  Constitutional: Negative.   HENT: Negative.    Eyes: Negative.   Respiratory: Negative.    Cardiovascular: Negative.   Gastrointestinal: Negative.   Genitourinary: Negative.   Musculoskeletal: Negative.   Skin: Negative.   Neurological: Negative.   Endo/Heme/Allergies: Negative.   Psychiatric/Behavioral: Negative.     All other ROS negative except what is listed above and in the HPI.      Objective:    BP 119/74   Pulse 83   Temp (!) 97.4 F (36.3 C) (Oral)   Ht '4\' 8"'$  (1.422 m)   Wt 171 lb 6.4 oz (77.7 kg)   SpO2 97%   BMI 38.43 kg/m   Wt Readings from Last 3 Encounters:   01/04/23 171 lb 6.4 oz (77.7 kg)  11/08/22 170 lb (77.1 kg)  09/28/22 170 lb (77.1 kg)    Physical Exam Vitals and nursing note reviewed.  Constitutional:      General: She is not in acute distress.    Appearance: Normal appearance. She is obese. She is not ill-appearing, toxic-appearing or diaphoretic.  HENT:     Head: Normocephalic and atraumatic.     Right Ear: Tympanic membrane, ear canal and external ear normal. There is no impacted cerumen.     Left Ear: Tympanic membrane, ear canal and external ear normal. There is no impacted cerumen.     Nose: Nose normal. No congestion or rhinorrhea.     Mouth/Throat:     Mouth: Mucous membranes are moist.     Pharynx: Oropharynx is clear. No oropharyngeal exudate or posterior oropharyngeal erythema.  Eyes:     General: No scleral icterus.       Right eye: No discharge.        Left eye: No discharge.     Extraocular Movements: Extraocular movements intact.     Conjunctiva/sclera: Conjunctivae normal.     Pupils: Pupils are equal, round, and reactive to light.  Neck:     Vascular: No carotid bruit.  Cardiovascular:     Rate and Rhythm: Normal rate and regular rhythm.     Pulses: Normal pulses.     Heart sounds: No murmur heard.    No friction rub. No gallop.  Pulmonary:     Effort: Pulmonary effort is normal. No respiratory distress.     Breath sounds: Normal breath sounds. No stridor. No wheezing, rhonchi or rales.  Chest:     Chest wall: No tenderness.  Abdominal:     General: Abdomen is flat. Bowel sounds are normal. There is no distension.     Palpations: Abdomen is soft. There is no mass.  Tenderness: There is no abdominal tenderness. There is no right CVA tenderness, left CVA tenderness, guarding or rebound.     Hernia: No hernia is present.  Genitourinary:    Comments: Breast and pelvic exams deferred with shared decision making Musculoskeletal:        General: No swelling, tenderness, deformity or signs of injury.      Cervical back: Normal range of motion and neck supple. No rigidity. No muscular tenderness.     Right lower leg: No edema.     Left lower leg: No edema.  Lymphadenopathy:     Cervical: No cervical adenopathy.  Skin:    General: Skin is warm and dry.     Capillary Refill: Capillary refill takes less than 2 seconds.     Coloration: Skin is not jaundiced or pale.     Findings: No bruising, erythema, lesion or rash.  Neurological:     General: No focal deficit present.     Mental Status: She is alert and oriented to person, place, and time. Mental status is at baseline.     Cranial Nerves: No cranial nerve deficit.     Sensory: No sensory deficit.     Motor: No weakness.     Coordination: Coordination normal.     Gait: Gait normal.     Deep Tendon Reflexes: Reflexes normal.  Psychiatric:        Mood and Affect: Mood normal.        Behavior: Behavior normal.        Thought Content: Thought content normal.        Judgment: Judgment normal.     Results for orders placed or performed in visit on 11/08/22  CULTURE, URINE COMPREHENSIVE   Specimen: Urine   UR  Result Value Ref Range   Urine Culture, Comprehensive Final report (A)    Organism ID, Bacteria Enterococcus faecalis (A)    ANTIMICROBIAL SUSCEPTIBILITY Comment   Microscopic Examination   Urine  Result Value Ref Range   WBC, UA 6-10 (A) 0 - 5 /hpf   RBC, Urine 0-2 0 - 2 /hpf   Epithelial Cells (non renal) 0-10 0 - 10 /hpf   Renal Epithel, UA 0-10 (A) None seen /hpf   Bacteria, UA Moderate (A) None seen/Few  Urinalysis, Complete  Result Value Ref Range   Specific Gravity, UA 1.010 1.005 - 1.030   pH, UA 6.5 5.0 - 7.5   Color, UA Yellow Yellow   Appearance Ur Hazy (A) Clear   Leukocytes,UA 1+ (A) Negative   Protein,UA Negative Negative/Trace   Glucose, UA Negative Negative   Ketones, UA Negative Negative   RBC, UA Negative Negative   Bilirubin, UA Negative Negative   Urobilinogen, Ur 0.2 0.2 - 1.0 mg/dL   Nitrite,  UA Negative Negative   Microscopic Examination See below:       Assessment & Plan:   Problem List Items Addressed This Visit       Cardiovascular and Mediastinum   Atherosclerosis    Will keep BP and cholesterol under good control. Continue to monitor. Call with any concerns      Carotid stenosis    Will keep BP and cholesterol under good control. Continue to monitor. Call with any concerns      HTN (hypertension)    Under good control on current regimen. Continue current regimen. Continue to monitor. Call with any concerns. Refills given. Labs drawn today.       Relevant Orders   CBC  with Differential/Platelet   Comprehensive metabolic panel   Urinalysis, Routine w reflex microscopic   Urine Microalbumin w/creat. ratio   Aortic atherosclerosis (HCC)    Will keep BP and cholesterol under good control. Continue to monitor. Call with any concerns.       Relevant Orders   CBC with Differential/Platelet   Comprehensive metabolic panel   Lipid Panel w/o Chol/HDL Ratio     Digestive   GERD (gastroesophageal reflux disease)    Under good control on current regimen. Continue current regimen. Continue to monitor. Call with any concerns. Refills given. Labs drawn today.        Relevant Medications   esomeprazole (NEXIUM) 40 MG capsule   Other Relevant Orders   CBC with Differential/Platelet   Comprehensive metabolic panel     Endocrine   Hypothyroidism    Rechecking labs today. Await results. Treat as needed.       Relevant Orders   CBC with Differential/Platelet   Comprehensive metabolic panel   TSH     Genitourinary   Chronic kidney disease, stage 3 ([place])    Rechecking labs today. Await results. Treat as needed.         Other   Hypercholesterolemia    Unable to tolerate statins. Continue diet and exercise. Call with any concerns.       Relevant Orders   CBC with Differential/Platelet   Comprehensive metabolic panel   Lipid Panel w/o Chol/HDL Ratio    Statin intolerance    Unable to tolerate statins. Continue to monitor. Call with any concerns.       Relevant Orders   CBC with Differential/Platelet   Comprehensive metabolic panel   Morbid obesity ([place])    Encouraged diet and exercise. Call with any concerns. Continue to monitor.       Other Visit Diagnoses     Routine general medical examination at a health care facility    -  Primary   Vaccines up to date. Screening labs checked today. Continue diet and exercise. Call with any concerns.        Follow up plan: Return in about 3 months (around 04/04/2023).   LABORATORY TESTING:  - Pap smear: not applicable  IMMUNIZATIONS:   - Tdap: Tetanus vaccination status reviewed: last tetanus booster within 10 years. - Influenza: Up to date - Pneumovax: Up to date - Prevnar: Up to date - COVID: Up to date - HPV: Not applicable - Shingrix vaccine: Up to date  SCREENING: -Mammogram: Not applicable  - Colonoscopy: Not applicable  - Bone Density: Up to date    PATIENT COUNSELING:   Advised to take 1 mg of folate supplement per day if capable of pregnancy.   Sexuality: Discussed sexually transmitted diseases, partner selection, use of condoms, avoidance of unintended pregnancy  and contraceptive alternatives.   Advised to avoid cigarette smoking.  I discussed with the patient that most people either abstain from alcohol or drink within safe limits (<=14/week and <=4 drinks/occasion for males, <=7/weeks and <= 3 drinks/occasion for females) and that the risk for alcohol disorders and other health effects rises proportionally with the number of drinks per week and how often a drinker exceeds daily limits.  Discussed cessation/primary prevention of drug use and availability of treatment for abuse.   Diet: Encouraged to adjust caloric intake to maintain  or achieve ideal body weight, to reduce intake of dietary saturated fat and total fat, to limit sodium intake by avoiding high  sodium foods and not  adding table salt, and to maintain adequate dietary potassium and calcium preferably from fresh fruits, vegetables, and low-fat dairy products.    stressed the importance of regular exercise  Injury prevention: Discussed safety belts, safety helmets, smoke detector, smoking near bedding or upholstery.   Dental health: Discussed importance of regular tooth brushing, flossing, and dental visits.    NEXT PREVENTATIVE PHYSICAL DUE IN 1 YEAR. Return in about 3 months (around 04/04/2023).

## 2023-01-05 ENCOUNTER — Encounter: Payer: Self-pay | Admitting: Family Medicine

## 2023-01-06 ENCOUNTER — Other Ambulatory Visit: Payer: Self-pay | Admitting: Family Medicine

## 2023-01-07 ENCOUNTER — Encounter: Payer: Self-pay | Admitting: Family Medicine

## 2023-01-07 LAB — URINALYSIS, ROUTINE W REFLEX MICROSCOPIC
Bilirubin, UA: NEGATIVE
Glucose, UA: NEGATIVE
Ketones, UA: NEGATIVE
Nitrite, UA: NEGATIVE
Protein,UA: NEGATIVE
RBC, UA: NEGATIVE
Specific Gravity, UA: 1.014 (ref 1.005–1.030)
Urobilinogen, Ur: 0.2 mg/dL (ref 0.2–1.0)
pH, UA: 6 (ref 5.0–7.5)

## 2023-01-07 LAB — MICROSCOPIC EXAMINATION
Bacteria, UA: NONE SEEN
Casts: NONE SEEN /lpf
Epithelial Cells (non renal): >10 /hpf — AB (ref 0–10)

## 2023-01-07 LAB — MICROALBUMIN / CREATININE URINE RATIO
Creatinine, Urine: 84.3 mg/dL
Microalb/Creat Ratio: 5 mg/g creat (ref 0–29)
Microalbumin, Urine: 3.8 ug/mL

## 2023-01-07 NOTE — Telephone Encounter (Signed)
Unable to refill per protocol, Rx request is too soon. Last refill 09/28/22 for 90 and 3 refills.  Requested Prescriptions  Pending Prescriptions Disp Refills   levothyroxine (SYNTHROID) 50 MCG tablet [Pharmacy Med Name: LEVOTHYROXINE 0.05MG (50MCG) TAB] 90 tablet 3    Sig: TAKE 1 TABLET(50 MCG) BY MOUTH AT BEDTIME     Endocrinology:  Hypothyroid Agents Passed - 01/06/2023  4:57 PM      Passed - TSH in normal range and within 360 days    TSH  Date Value Ref Range Status  06/28/2022 3.090 0.450 - 4.500 uIU/mL Final         Passed - Valid encounter within last 12 months    Recent Outpatient Visits           3 days ago Routine general medical examination at a health care facility   [place] P, DO   3 months ago Memory loss   [place] P, DO   5 months ago Pain with urination   [place], PA-C   6 months ago Pelvic pain   [place], NP   6 months ago Hypothyroidism, unspecified type   [place], [place], DO       Future Appointments             In 5 months Wynetta Emery, Barb Merino, DO [place]   In 10 months MacDiarm[place], Nicki Reaper, [place]

## 2023-01-10 ENCOUNTER — Encounter: Payer: Self-pay | Admitting: Family Medicine

## 2023-01-10 ENCOUNTER — Other Ambulatory Visit: Payer: Self-pay

## 2023-01-10 LAB — LIPID PANEL W/O CHOL/HDL RATIO
Cholesterol, Total: 223 mg/dL — ABNORMAL HIGH (ref 100–199)
HDL: 48 mg/dL (ref >39)
LDL Chol Calc (NIH): 146 mg/dL — ABNORMAL HIGH (ref 0–99)
Triglycerides: 160 mg/dL — ABNORMAL HIGH (ref 0–149)
VLDL Cholesterol Cal: 29 mg/dL (ref 5–40)

## 2023-01-10 LAB — CBC WITH DIFFERENTIAL/PLATELET
Basophils Absolute: 0.1 10*3/uL (ref 0.0–0.2)
Basos: 1 %
EOS (ABSOLUTE): 0.4 10*3/uL (ref 0.0–0.4)
Eos: 5 %
Hematocrit: 39.1 % (ref 34.0–46.6)
Hemoglobin: 12.7 g/dL (ref 11.1–15.9)
Immature Grans (Abs): 0 10*3/uL (ref 0.0–0.1)
Immature Granulocytes: 0 %
Lymphocytes Absolute: 2.4 10*3/uL (ref 0.7–3.1)
Lymphs: 33 %
MCH: 31.2 pg (ref 26.6–33.0)
MCHC: 32.5 g/dL (ref 31.5–35.7)
MCV: 96 fL (ref 79–97)
Monocytes Absolute: 0.8 10*3/uL (ref 0.1–0.9)
Monocytes: 11 %
Neutrophils Absolute: 3.6 10*3/uL (ref 1.4–7.0)
Neutrophils: 50 %
Platelets: 292 10*3/uL (ref 150–450)
RBC: 4.07 x10E6/uL (ref 3.77–5.28)
RDW: 12.7 % (ref 11.7–15.4)
WBC: 7.2 10*3/uL (ref 3.4–10.8)

## 2023-01-10 LAB — COMPREHENSIVE METABOLIC PANEL
ALT: 10 IU/L (ref 0–32)
AST: 15 IU/L (ref 0–40)
Albumin/Globulin Ratio: 1.3 (ref 1.2–2.2)
Albumin: 3.9 g/dL (ref 3.7–4.7)
Alkaline Phosphatase: 62 IU/L (ref 44–121)
BUN/Creatinine Ratio: 11 — ABNORMAL LOW (ref 12–28)
BUN: 10 mg/dL (ref 8–27)
Bilirubin Total: 0.4 mg/dL (ref 0.0–1.2)
CO2: 23 mmol/L (ref 20–29)
Calcium: 9.1 mg/dL (ref 8.7–10.3)
Chloride: 100 mmol/L (ref 96–106)
Creatinine, Ser: 0.9 mg/dL (ref 0.57–1.00)
Globulin, Total: 2.9 g/dL (ref 1.5–4.5)
Glucose: 108 mg/dL — ABNORMAL HIGH (ref 70–99)
Potassium: 4.1 mmol/L (ref 3.5–5.2)
Sodium: 136 mmol/L (ref 134–144)
Total Protein: 6.8 g/dL (ref 6.0–8.5)
eGFR: 61 mL/min/{1.73_m2} (ref >59)

## 2023-01-10 LAB — TSH: TSH: 3.39 u[IU]/mL (ref 0.450–4.500)

## 2023-01-10 MED ORDER — LEVOTHYROXINE SODIUM 50 MCG PO TABS: 50.0000 ug | ORAL_TABLET | Freq: Every day | ORAL | 3 refills | Status: DC

## 2023-01-10 MED ORDER — PANTOPRAZOLE SODIUM 40 MG PO TBEC: 40.0000 mg | DELAYED_RELEASE_TABLET | Freq: Every day | ORAL | 1 refills | Status: DC

## 2023-01-10 NOTE — Telephone Encounter (Signed)
Pt returned missed call, please advise  [phone]

## 2023-01-10 NOTE — Telephone Encounter (Signed)
Received faxed from patient's pharmacy with recommendations stating "Please be advised that this in not recommended to be used in addition to Clopidogrel since it will reduce its efficacy. Pantoprazole would be the better option if possible." Please advise looks at if patient has tried it before in the past.

## 2023-01-11 ENCOUNTER — Ambulatory Visit (INDEPENDENT_AMBULATORY_CARE_PROVIDER_SITE_OTHER): Payer: Medicare Other | Admitting: Family Medicine

## 2023-01-11 ENCOUNTER — Encounter: Payer: Self-pay | Admitting: Family Medicine

## 2023-01-11 VITALS — BP 129/82 | HR 78 | Ht <= 58 in | Wt 163.9 lb

## 2023-01-11 DIAGNOSIS — R300 Dysuria: Secondary | ICD-10-CM

## 2023-01-11 DIAGNOSIS — R8281 Pyuria: Secondary | ICD-10-CM

## 2023-01-11 NOTE — Progress Notes (Unsigned)
   Acute Office Visit  Subjective:     Patient ID: Sarah Freeman, female    DOB: 1931/08/17, 89 y.o.   MRN: [id number]  Chief Complaint  Patient presents with   Urinary Tract Infection   Dysuria   Flank Pain    Patient says her pain and discomfort started a week ago. Patient says she has just been drinking plenty of water.     URINARY SYMPTOMS Fullness/pressure in bladder that started 2 weeks ago, constant ache that goes away when using the bathroom.  Dysuria: no Urinary frequency: yes Urgency: no Small volume voids: yes Symptom severity: 5/10 Urinary incontinence: no Foul odor: no Hematuria: no Abdominal pain: no;  Back pain: no Suprapubic pain/pressure: yes Flank pain: no Fever:  no Vomiting: no Status: Stable Previous urinary tract infection: yes; recurrent Recurrent urinary tract infection: yes; feels this is the same symptom Sexual activity: No sexually active/monogomous/practicing safe sex  Treatments attempted: Aspirin; provides relief. Son states pt does not drink a lot of water.       Review of Systems  Constitutional: Negative.  Negative for fever and malaise/fatigue.  Respiratory:  Negative for cough, hemoptysis and shortness of breath.   Cardiovascular: Negative.  Negative for chest pain and palpitations.  Genitourinary:  Negative for dysuria.       Fullness/pressure in bladder, relieved with urination  Musculoskeletal:  Negative for back pain.        Objective:    BP 129/82   Pulse 78   Ht '4\' 8"'$  (1.422 m)   Wt 163 lb 14.4 oz (74.3 kg)   SpO2 97%   BMI 36.75 kg/m  {Vitals History (Optional):23777}  Physical Exam Cardiovascular:     Rate and Rhythm: Normal rate and regular rhythm.     Heart sounds: Normal heart sounds.  Pulmonary:     Effort: Pulmonary effort is normal.     Breath sounds: Normal breath sounds.  Abdominal:     Palpations: Abdomen is soft.     Tenderness: There is no abdominal tenderness. There is no right CVA  tenderness, left CVA tenderness or guarding.  Neurological:     Mental Status: She is alert.     No results found for any visits on 01/11/23.      Assessment & Plan:   Problem List Items Addressed This Visit   None Visit Diagnoses     Dysuria    -  Primary   Will check urine and culture. Likely due to not drinking enough water. Stressed the importance of drinking more water. Continue to monitor.   Relevant Orders   Urinalysis, Routine w reflex microscopic   Pyuria       Will check urine and culture. Likely due to not drinking enough water. Stressed the importance of drinking more water. Continue to monitor.   Relevant Orders   Urine Culture       No orders of the defined types were placed in this encounter.   Return if symptoms worsen or fail to improve.  Rashelle Pearley

## 2023-01-11 NOTE — Progress Notes (Unsigned)
BP 129/82   Pulse 78   Ht 4' 8"$  (1.422 m)   Wt 163 lb 14.4 oz (74.3 kg)   SpO2 97%   BMI 36.75 kg/m    Subjective:    Patient ID: Sarah Freeman, female    DOB: 08-21-1931, 89 y.o.   MRN: [id number]  HPI: Sarah Freeman is a 89 y.o. female  Chief Complaint  Patient presents with   Urinary Tract Infection   Dysuria   Flank Pain    Patient says her pain and discomfort started a week ago. Patient says she has just been drinking plenty of water.    URINARY SYMPTOMS Duration: 2 weeks ago Dysuria: no Urinary frequency: yes Urgency: yes Small volume voids: yes Symptom severity: moderate Urinary incontinence: yes Foul odor: yes Hematuria: no Abdominal pain: yes Back pain: no Suprapubic pain/pressure: yes Flank pain: no Fever:  no Vomiting: no Relief with cranberry juice: no Relief with pyridium: no Status: stable Previous urinary tract infection: yes Recurrent urinary tract infection: yes Sexual activity: No sexually active History of sexually transmitted disease: no Vaginal discharge: no Treatments attempted: none   Relevant past medical, surgical, family and social history reviewed and updated as indicated. Interim medical history since our last visit reviewed. Allergies and medications reviewed and updated.  Review of Systems  Constitutional: Negative.   Respiratory: Negative.    Cardiovascular: Negative.   Gastrointestinal: Negative.   Genitourinary:  Positive for dysuria, frequency and urgency. Negative for decreased urine volume, difficulty urinating, dyspareunia, enuresis, flank pain, genital sores, hematuria, menstrual problem, pelvic pain, vaginal bleeding, vaginal discharge and vaginal pain.  Musculoskeletal: Negative.   Psychiatric/Behavioral: Negative.      Per HPI unless specifically indicated above     Objective:    BP 129/82   Pulse 78   Ht 4' 8"$  (1.422 m)   Wt 163 lb 14.4 oz (74.3 kg)   SpO2 97%   BMI 36.75 kg/m   Wt Readings from  Last 3 Encounters:  01/11/23 163 lb 14.4 oz (74.3 kg)  01/04/23 171 lb 6.4 oz (77.7 kg)  11/08/22 170 lb (77.1 kg)    Physical Exam Vitals and nursing note reviewed.  Constitutional:      General: She is not in acute distress.    Appearance: Normal appearance. She is obese. She is not ill-appearing, toxic-appearing or diaphoretic.  HENT:     Head: Normocephalic and atraumatic.     Right Ear: External ear normal.     Left Ear: External ear normal.     Nose: Nose normal.     Mouth/Throat:     Mouth: Mucous membranes are moist.     Pharynx: Oropharynx is clear.  Eyes:     General: No scleral icterus.       Right eye: No discharge.        Left eye: No discharge.     Extraocular Movements: Extraocular movements intact.     Conjunctiva/sclera: Conjunctivae normal.     Pupils: Pupils are equal, round, and reactive to light.  Cardiovascular:     Rate and Rhythm: Normal rate and regular rhythm.     Pulses: Normal pulses.     Heart sounds: Normal heart sounds. No murmur heard.    No friction rub. No gallop.  Pulmonary:     Effort: Pulmonary effort is normal. No respiratory distress.     Breath sounds: Normal breath sounds. No stridor. No wheezing, rhonchi or rales.  Chest:  Chest wall: No tenderness.  Musculoskeletal:        General: Normal range of motion.     Cervical back: Normal range of motion and neck supple.  Skin:    General: Skin is warm and dry.     Capillary Refill: Capillary refill takes less than 2 seconds.     Coloration: Skin is not jaundiced or pale.     Findings: No bruising, erythema, lesion or rash.  Neurological:     General: No focal deficit present.     Mental Status: She is alert and oriented to person, place, and time. Mental status is at baseline.  Psychiatric:        Mood and Affect: Mood normal.        Behavior: Behavior normal.        Thought Content: Thought content normal.        Judgment: Judgment normal.     Results for orders placed or  performed in visit on 01/11/23  Urine Culture   Urine  Result Value Ref Range   Urine Culture, Routine WILL FOLLOW   Urinalysis, Routine w reflex microscopic  Result Value Ref Range   Specific Gravity, UA 1.016 1.005 - 1.030   pH, UA 6.0 5.0 - 7.5   Color, UA Yellow Yellow   Appearance Ur Clear Clear   Leukocytes,UA Negative Negative   Protein,UA Negative Negative/Trace   Glucose, UA Negative Negative   Ketones, UA Negative Negative   RBC, UA Negative Negative   Bilirubin, UA Negative Negative   Urobilinogen, Ur 0.2 0.2 - 1.0 mg/dL   Nitrite, UA Negative Negative   Microscopic Examination Comment       Assessment & Plan:   Problem List Items Addressed This Visit   None Visit Diagnoses     Dysuria    -  Primary   Will check urine and culture. Likely due to not drinking enough water. Stressed the importance of drinking more water. Continue to monitor.   Relevant Orders   Urinalysis, Routine w reflex microscopic (Completed)   Pyuria       Will check urine and culture. Likely due to not drinking enough water. Stressed the importance of drinking more water. Continue to monitor.   Relevant Orders   Urine Culture        Follow up plan: Return if symptoms worsen or fail to improve.

## 2023-01-12 ENCOUNTER — Encounter: Payer: Self-pay | Admitting: Family Medicine

## 2023-01-13 LAB — URINALYSIS, ROUTINE W REFLEX MICROSCOPIC
Bilirubin, UA: NEGATIVE
Glucose, UA: NEGATIVE
Ketones, UA: NEGATIVE
Leukocytes,UA: NEGATIVE
Nitrite, UA: NEGATIVE
Protein,UA: NEGATIVE
RBC, UA: NEGATIVE
Specific Gravity, UA: 1.016 (ref 1.005–1.030)
Urobilinogen, Ur: 0.2 mg/dL (ref 0.2–1.0)
pH, UA: 6 (ref 5.0–7.5)

## 2023-01-13 LAB — URINE CULTURE

## 2023-01-18 ENCOUNTER — Telehealth: Payer: Self-pay | Admitting: Family Medicine

## 2023-01-18 NOTE — Telephone Encounter (Signed)
Contacted Why to schedule their annual wellness visit. Appointment made for 02/01/2023.  [facility] Direct Dial: [phone]

## 2023-02-01 ENCOUNTER — Ambulatory Visit (INDEPENDENT_AMBULATORY_CARE_PROVIDER_SITE_OTHER): Payer: Medicare Other

## 2023-02-01 VITALS — Ht <= 58 in | Wt 163.0 lb

## 2023-02-01 DIAGNOSIS — Z0000 Encounter for general adult medical examination without abnormal findings: Secondary | ICD-10-CM

## 2023-02-01 NOTE — Progress Notes (Signed)
I connected with  CORINA NIELAND on 02/01/23 by a audio enabled telemedicine application and verified that I am speaking with the correct person using two identifiers.  Patient Location: Home  Provider Location: Office/Clinic  I discussed the limitations of evaluation and management by telemedicine. The patient expressed understanding and agreed to proceed.  Subjective:   Daveah NOVI BELD is a 89 y.o. female who presents for Medicare Annual (Subsequent) preventive examination.  Review of Systems     Cardiac Risk Factors include: advanced age (>71mn, >>27women);hypertension     Objective:    There were no vitals filed for this visit. There is no height or weight on file to calculate BMI.     02/01/2023    3:31 PM 01/18/2022    8:33 AM 05/27/2021   12:24 PM 01/16/2021    8:22 AM 11/12/2019   10:00 AM 07/12/2018    7:09 AM 03/06/2018    8:30 AM  Advanced Directives  Does Patient Have a Medical Advance Directive? No No No Yes No Yes Yes  Type of AScientist, research (medical)Living will  [place] will Living will  Does patient want to make changes to medical advance directive?     Yes (MAU/Ambulatory/Procedural Areas - Information given)    Copy of [place]?    No - copy requested  No - copy requested   Would patient like information on creating a medical advance directive? No - Patient declined No - Patient declined No - Patient declined        Current Medications (verified) Outpatient Encounter Medications as of 02/01/2023  Medication Sig   Cholecalciferol (VITAMIN D3) 10 MCG (400 UNIT) CAPS    clopidogrel (PLAVIX) 75 MG tablet Take 1 tablet (75 mg total) by mouth daily.   donepezil (ARICEPT) 5 MG tablet Take 1 tablet (5 mg total) by mouth at bedtime.   esomeprazole (NEXIUM) 40 MG capsule Take 1 capsule (40 mg total) by mouth daily.   gabapentin (NEURONTIN) 100 MG capsule TAKE 2 CAPSULE(100 MG) BY MOUTH  IN THE AM and 1 AT PM   levothyroxine (SYNTHROID) 50 MCG tablet Take 1 tablet (50 mcg total) by mouth daily before breakfast.   pantoprazole (PROTONIX) 40 MG tablet Take 1 tablet (40 mg total) by mouth daily.   polyethylene glycol (MIRALAX / GLYCOLAX) 17 g packet Take 17 g by mouth daily.   PROLIA 60 MG/ML SOSY injection Inject 60 mg into the skin every 6 (six) months.   nitrofurantoin, macrocrystal-monohydrate, (MACROBID) 100 MG capsule Take 1 capsule (100 mg total) by mouth at bedtime. (Patient not taking: Reported on 02/01/2023)   No facility-administered encounter medications on file as of 02/01/2023.    Allergies (verified) Statins, Bacitracin, Neomycin, Neosporin + pain relief max st  [neomy-bacit-polymyx-pramoxine], Polymyxin b, and Neomycin-polymyxin-gramicidin   History: Past Medical History:  Diagnosis Date   Hyperlipidemia    Hypothyroid    Stroke ([facility]    Urinary incontinence    Past Surgical History:  Procedure Laterality Date   BREAST BIOPSY Left 2000?   neg. dr. [facility]   CHOLECYSTECTOMY  1964   COLONOSCOPY     Dr KNicolasa Ducking  DILATION AND CURETTAGE OF UTERUS     EYE SURGERY     LOOP RECORDER INSERTION N/A 02/09/2018   Procedure: LOOP RECORDER INSERTION;  Surgeon: KDeboraha Sprang MD;  [facility];  Service: Cardiovascular;  Laterality: N/A;  SKIN CANCER EXCISION     face   TEE WITHOUT CARDIOVERSION N/A 02/09/2018   Procedure: TRANSESOPHAGEAL ECHOCARDIOGRAM (TEE);  Surgeon: Minna Merritts, MD;  Location: [facility];  Service: Cardiovascular;  Laterality: N/A;   TOTAL ABDOMINAL HYSTERECTOMY W/ BILATERAL SALPINGOOPHORECTOMY     age 60   Family History  Problem Relation Age of Onset   Cancer Father        prostate   Stroke Father    Hypertension Sister    Hyperlipidemia Daughter    Heart disease Son    Diabetes Maternal Grandmother    Heart disease Maternal Grandmother    Heart disease Maternal Grandfather    Heart disease Daughter     CAD Mother    Failure to thrive Mother    Breast cancer Neg Hx    Social History   Socioeconomic History   Marital status: Widowed    Spouse name: Not on file   Number of children: Not on file   Years of education: Not on file   Highest education level: Not on file  Occupational History   Occupation: retired  Tobacco Use   Smoking status: Never    Passive exposure: Never   Smokeless tobacco: Never  Vaping Use   Vaping Use: Never used  Substance and Sexual Activity   Alcohol use: No    Alcohol/week: 0.0 standard drinks of alcohol   Drug use: No   Sexual activity: Not Currently  Other Topics Concern   Not on file  Social History Narrative   Not on file   Social Determinants of Health   Financial Resource Strain: [place]  (02/01/2023)   Overall Financial Resource Strain (CARDIA)    Difficulty of Paying Living Expenses: Not hard at all  Food Insecurity: No Food Insecurity (02/01/2023)   Hunger Vital Sign    Worried About Running Out of Food in the Last Year: Never true    [place] in the Last Year: Never true  Transportation Needs: No Transportation Needs (02/01/2023)   PRAPARE - Hydrologist (Medical): No    Lack of Transportation (Non-Medical): No  Physical Activity: Insufficiently Active (02/01/2023)   Exercise Vital Sign    Days of Exercise per Week: 4 days    Minutes of Exercise per Session: 10 min  Stress: No Stress Concern Present (02/01/2023)   [place]    Feeling of Stress : Not at all  Social Connections: Moderately Isolated (02/01/2023)   Social Connection and Isolation Panel [NHANES]    Frequency of Communication with Friends and Family: More than three times a week    Frequency of Social Gatherings with Friends and Family: More than three times a week    Attends Religious Services: More than 4 times per year    Active Member of [employer] or Organizations: No     Attends Archivist Meetings: Never    Marital Status: Widowed    Tobacco Counseling Counseling given: Not Answered   Clinical Intake:  Pre-visit preparation completed: Yes  Pain : No/denies pain     Nutritional Risks: None Diabetes: No  How often do you need to have someone help you when you read instructions, pamphlets, or other written materials from your doctor or pharmacy?: 1 - Never  Diabetic?NO  Interpreter Needed?: No  Information entered by :: Kirke Shaggy, LPN   Activities of Daily Living    02/01/2023  3:32 PM  In your present state of health, do you have any difficulty performing the following activities:  Hearing? 1  Vision? 0  Difficulty concentrating or making decisions? 0  Walking or climbing stairs? 0  Dressing or bathing? 0  Doing errands, shopping? 0  Preparing Food and eating ? N  Using the Toilet? N  In the past six months, have you accidently leaked urine? N  Do you have problems with loss of bowel control? N  Managing your Medications? N  Managing your Finances? N  Housekeeping or managing your Housekeeping? N    Patient Care Team: Valerie Roys, DO as PCP - General (Family Medicine) Minna Merritts, MD as PCP - Cardiology (Cardiology) Bary Castilla Forest Gleason, MD (General Surgery) Valerie Roys, DO as Referring Physician (Family Medicine)  Indicate any recent Medical Services you may have received from other than [facility] providers in the past year (date may be approximate).     Assessment:   This is a routine wellness examination for Kimbrely.  Hearing/Vision screen Hearing Screening - Comments:: HAS AIDS  Vision Screening - Comments:: WEARS GLASSES-   Dietary issues and exercise activities discussed: Current Exercise Habits: Home exercise routine, Type of exercise: stretching, Time (Minutes): 10, Frequency (Times/Week): 4, Weekly Exercise (Minutes/Week): 40, Intensity: Mild   Goals Addressed             This  Visit's Progress    DIET - EAT MORE FRUITS AND VEGETABLES         Depression Screen    02/01/2023    3:30 PM 01/04/2023   10:04 AM 09/28/2022    9:51 AM 04/30/2022    3:40 PM 01/18/2022    8:32 AM 12/29/2021    9:14 AM 01/16/2021    8:23 AM  PHQ 2/9 Scores  PHQ - 2 Score 0 0 2 2 0 0 0  PHQ- 9 Score 0 0 3 4 0 0     Fall Risk    02/01/2023    3:32 PM 01/04/2023   10:04 AM 04/30/2022    3:40 PM 01/18/2022    8:22 AM 12/29/2021    9:16 AM  Fall Risk   Falls in the past year? 0 0 0 0 0  Number falls in past yr: 0 0 0 0 0  Injury with Fall? 0 0 0 0 0  Risk for fall due to : No Fall Risks No Fall Risks No Fall Risks  No Fall Risks  Follow up Falls prevention discussed;Falls evaluation completed Falls evaluation completed Falls evaluation completed Falls evaluation completed;Falls prevention discussed Falls evaluation completed    FALL RISK PREVENTION PERTAINING TO THE HOME:  Any stairs in or around the home? Yes  If so, are there any without handrails? No  Home free of loose throw rugs in walkways, pet beds, electrical cords, etc? Yes  Adequate lighting in your home to reduce risk of falls? Yes   ASSISTIVE DEVICES UTILIZED TO PREVENT FALLS:  Life alert? No  Use of a cane, walker or w/c? No  Grab bars in the bathroom? Yes  Shower chair or bench in shower? No  Elevated toilet seat or a handicapped toilet? No    Cognitive Function:pt is very HOH which hindered her testing     06/28/2022    9:41 AM  MMSE - Mini Mental State Exam  Orientation to time 3  Orientation to Place 3  Registration 3  Attention/ Calculation 0  Recall 0  Language- name  2 objects 2  Language- repeat 1  Language- follow 3 step command 3  Language- read & follow direction 1  Write a sentence 1  Copy design 1  Total score 18        02/01/2023    3:36 PM 01/18/2022    8:18 AM 01/16/2021    8:26 AM 11/12/2019   10:11 AM 03/06/2018    8:31 AM  6CIT Screen  What Year? 0 points 4 points 4 points 0 points 0  points  What month? 0 points 0 points 0 points 0 points 0 points  What time? 0 points 0 points 0 points 0 points 0 points  Count back from 20 4 points 2 points 0 points 0 points 0 points  Months in reverse 4 points 2 points 0 points 0 points 0 points  Repeat phrase 8 points 4 points 10 points 0 points 0 points  Total Score 16 points 12 points 14 points 0 points 0 points    Immunizations Immunization History  Administered Date(s) Administered   Fluad Quad(high Dose 65+) 09/28/2022   Influenza, High Dose Seasonal PF 10/05/2017, 10/11/2018, 08/30/2019   Influenza-Unspecified 10/06/2016, 06/29/2021   PFIZER(Purple Top)SARS-COV-2 Vaccination 12/06/2019, 12/29/2019, 08/29/2020   Pneumococcal Conjugate-13 10/06/2016   Pneumococcal Polysaccharide-23 07/22/2004, 10/05/2017   Td 01/20/2011   Tdap 05/19/2017   Zoster Recombinat (Shingrix) 03/18/2017, 05/19/2017   Zoster, Live 01/09/2008    TDAP status: Up to date  Flu Vaccine status: Up to date  Pneumococcal vaccine status: Up to date  Covid-19 vaccine status: Completed vaccines  Qualifies for Shingles Vaccine? Yes   Zostavax completed Yes   Shingrix Completed?: Yes  Screening Tests Health Maintenance  Topic Date Due   COVID-19 Vaccine (4 - 2023-24 season) 07/30/2022   Medicare Annual Wellness (AWV)  02/01/2024   DTaP/Tdap/Td (3 - Td or Tdap) 05/20/2027   Pneumonia Vaccine 54+ Years old  Completed   INFLUENZA VACCINE  Completed   DEXA SCAN  Completed   Zoster Vaccines- Shingrix  Completed   HPV VACCINES  Aged Out    Health Maintenance  Health Maintenance Due  Topic Date Due   COVID-19 Vaccine (4 - 2023-24 season) 07/30/2022    Colorectal cancer screening: No longer required.   Mammogram status: No longer required due to AGE.    Lung Cancer Screening: (Low Dose CT Chest recommended if Age 31-80 years, 30 pack-year currently smoking OR have quit w/in 15years.) does not qualify.    Additional Screening:  Hepatitis  C Screening: does not qualify; Completed NO  Vision Screening: Recommended annual ophthalmology exams for early detection of glaucoma and other disorders of the eye. Is the patient up to date with their annual eye exam?  Yes  Who is the provider or what is the name of the office in which the patient attends annual eye exams? DOESN'T KNOW If pt is not established with a provider, would they like to be referred to a provider to establish care? No .   Dental Screening: Recommended annual dental exams for proper oral hygiene  Community Resource Referral / Chronic Care Management: CRR required this visit?  No   CCM required this visit?  No      Plan:     I have personally reviewed and noted the following in the patient's chart:   Medical and social history Use of alcohol, tobacco or illicit drugs  Current medications and supplements including opioid prescriptions. Patient is not currently taking opioid prescriptions. Functional ability and status  Nutritional status Physical activity Advanced directives List of other physicians Hospitalizations, surgeries, and ER visits in previous 12 months Vitals Screenings to include cognitive, depression, and falls Referrals and appointments  In addition, I have reviewed and discussed with patient certain preventive protocols, quality metrics, and best practice recommendations. A written personalized care plan for preventive services as well as general preventive health recommendations were provided to patient.     Dionisio David, LPN   [id number]   Nurse Notes: Marlynn Perking

## 2023-03-15 ENCOUNTER — Encounter: Payer: Self-pay | Admitting: Family Medicine

## 2023-04-06 ENCOUNTER — Encounter: Payer: Self-pay | Admitting: Internal Medicine

## 2023-04-06 ENCOUNTER — Inpatient Hospital Stay
Admission: EM | Admit: 2023-04-06 | Discharge: 2023-04-18 | DRG: 065 | Disposition: A | Discharge status: Skilled Nursing Facility | Payer: Medicare Other | Attending: Internal Medicine | Admitting: Internal Medicine

## 2023-04-06 ENCOUNTER — Emergency Department: Payer: Medicare Other

## 2023-04-06 ENCOUNTER — Other Ambulatory Visit: Payer: Self-pay

## 2023-04-06 ENCOUNTER — Observation Stay: Payer: Medicare Other

## 2023-04-06 DIAGNOSIS — R531 Weakness: Principal | ICD-10-CM

## 2023-04-06 DIAGNOSIS — Z888 Allergy status to other drugs, medicaments and biological substances status: Secondary | ICD-10-CM

## 2023-04-06 DIAGNOSIS — G4700 Insomnia, unspecified: Secondary | ICD-10-CM | POA: Diagnosis present

## 2023-04-06 DIAGNOSIS — Z9049 Acquired absence of other specified parts of digestive tract: Secondary | ICD-10-CM

## 2023-04-06 DIAGNOSIS — E861 Hypovolemia: Secondary | ICD-10-CM | POA: Diagnosis not present

## 2023-04-06 DIAGNOSIS — I639 Cerebral infarction, unspecified: Secondary | ICD-10-CM | POA: Diagnosis not present

## 2023-04-06 DIAGNOSIS — R413 Other amnesia: Secondary | ICD-10-CM | POA: Diagnosis present

## 2023-04-06 DIAGNOSIS — I10 Essential (primary) hypertension: Secondary | ICD-10-CM | POA: Diagnosis present

## 2023-04-06 DIAGNOSIS — N183 Chronic kidney disease, stage 3 unspecified: Secondary | ICD-10-CM | POA: Diagnosis present

## 2023-04-06 DIAGNOSIS — Z823 Family history of stroke: Secondary | ICD-10-CM

## 2023-04-06 DIAGNOSIS — Z90710 Acquired absence of both cervix and uterus: Secondary | ICD-10-CM

## 2023-04-06 DIAGNOSIS — G459 Transient cerebral ischemic attack, unspecified: Secondary | ICD-10-CM | POA: Diagnosis present

## 2023-04-06 DIAGNOSIS — K5900 Constipation, unspecified: Secondary | ICD-10-CM | POA: Diagnosis not present

## 2023-04-06 DIAGNOSIS — I6381 Other cerebral infarction due to occlusion or stenosis of small artery: Secondary | ICD-10-CM | POA: Diagnosis not present

## 2023-04-06 DIAGNOSIS — Z85828 Personal history of other malignant neoplasm of skin: Secondary | ICD-10-CM

## 2023-04-06 DIAGNOSIS — Z79899 Other long term (current) drug therapy: Secondary | ICD-10-CM

## 2023-04-06 DIAGNOSIS — G8314 Monoplegia of lower limb affecting left nondominant side: Secondary | ICD-10-CM | POA: Diagnosis present

## 2023-04-06 DIAGNOSIS — Z883 Allergy status to other anti-infective agents status: Secondary | ICD-10-CM

## 2023-04-06 DIAGNOSIS — I6523 Occlusion and stenosis of bilateral carotid arteries: Secondary | ICD-10-CM | POA: Diagnosis present

## 2023-04-06 DIAGNOSIS — Z833 Family history of diabetes mellitus: Secondary | ICD-10-CM

## 2023-04-06 DIAGNOSIS — Z8249 Family history of ischemic heart disease and other diseases of the circulatory system: Secondary | ICD-10-CM

## 2023-04-06 DIAGNOSIS — M810 Age-related osteoporosis without current pathological fracture: Secondary | ICD-10-CM | POA: Diagnosis present

## 2023-04-06 DIAGNOSIS — F03911 Unspecified dementia, unspecified severity, with agitation: Secondary | ICD-10-CM | POA: Diagnosis not present

## 2023-04-06 DIAGNOSIS — Z8673 Personal history of transient ischemic attack (TIA), and cerebral infarction without residual deficits: Secondary | ICD-10-CM

## 2023-04-06 DIAGNOSIS — Z7902 Long term (current) use of antithrombotics/antiplatelets: Secondary | ICD-10-CM

## 2023-04-06 DIAGNOSIS — K219 Gastro-esophageal reflux disease without esophagitis: Secondary | ICD-10-CM | POA: Diagnosis present

## 2023-04-06 DIAGNOSIS — I700 Atherosclerosis of aorta: Secondary | ICD-10-CM | POA: Diagnosis present

## 2023-04-06 DIAGNOSIS — E860 Dehydration: Secondary | ICD-10-CM | POA: Diagnosis present

## 2023-04-06 DIAGNOSIS — E039 Hypothyroidism, unspecified: Secondary | ICD-10-CM | POA: Diagnosis present

## 2023-04-06 DIAGNOSIS — I129 Hypertensive chronic kidney disease with stage 1 through stage 4 chronic kidney disease, or unspecified chronic kidney disease: Secondary | ICD-10-CM | POA: Diagnosis present

## 2023-04-06 DIAGNOSIS — E871 Hypo-osmolality and hyponatremia: Secondary | ICD-10-CM | POA: Diagnosis present

## 2023-04-06 DIAGNOSIS — Z6835 Body mass index (BMI) 35.0-35.9, adult: Secondary | ICD-10-CM

## 2023-04-06 DIAGNOSIS — Z66 Do not resuscitate: Secondary | ICD-10-CM | POA: Diagnosis present

## 2023-04-06 DIAGNOSIS — N390 Urinary tract infection, site not specified: Secondary | ICD-10-CM | POA: Diagnosis present

## 2023-04-06 DIAGNOSIS — R29702 NIHSS score 2: Secondary | ICD-10-CM | POA: Diagnosis present

## 2023-04-06 DIAGNOSIS — Z79890 Hormone replacement therapy (postmenopausal): Secondary | ICD-10-CM

## 2023-04-06 DIAGNOSIS — F05 Delirium due to known physiological condition: Secondary | ICD-10-CM | POA: Diagnosis not present

## 2023-04-06 DIAGNOSIS — N1831 Chronic kidney disease, stage 3a: Secondary | ICD-10-CM | POA: Diagnosis present

## 2023-04-06 DIAGNOSIS — E785 Hyperlipidemia, unspecified: Secondary | ICD-10-CM | POA: Diagnosis present

## 2023-04-06 DIAGNOSIS — I672 Cerebral atherosclerosis: Secondary | ICD-10-CM | POA: Diagnosis present

## 2023-04-06 DIAGNOSIS — F03918 Unspecified dementia, unspecified severity, with other behavioral disturbance: Secondary | ICD-10-CM | POA: Diagnosis present

## 2023-04-06 DIAGNOSIS — Z7983 Long term (current) use of bisphosphonates: Secondary | ICD-10-CM

## 2023-04-06 LAB — CBC
HCT: 41.5 % (ref 36.0–46.0)
Hemoglobin: 13.7 g/dL (ref 12.0–15.0)
MCH: 31.1 pg (ref 26.0–34.0)
MCHC: 33 g/dL (ref 30.0–36.0)
MCV: 94.1 fL (ref 80.0–100.0)
Platelets: 355 10*3/uL (ref 150–400)
RBC: 4.41 MIL/uL (ref 3.87–5.11)
RDW: 12.7 % (ref 11.5–15.5)
WBC: 10.6 10*3/uL — ABNORMAL HIGH (ref 4.0–10.5)
nRBC: 0 % (ref 0.0–0.2)

## 2023-04-06 LAB — COMPREHENSIVE METABOLIC PANEL
ALT: 16 U/L (ref 0–44)
AST: 23 U/L (ref 15–41)
Albumin: 3.5 g/dL (ref 3.5–5.0)
Alkaline Phosphatase: 55 U/L (ref 38–126)
Anion gap: 8 (ref 5–15)
BUN: 10 mg/dL (ref 8–23)
CO2: 24 mmol/L (ref 22–32)
Calcium: 8.7 mg/dL — ABNORMAL LOW (ref 8.9–10.3)
Chloride: 101 mmol/L (ref 98–111)
Creatinine, Ser: 0.96 mg/dL (ref 0.44–1.00)
GFR, Estimated: 56 mL/min — ABNORMAL LOW (ref >60)
Glucose, Bld: 125 mg/dL — ABNORMAL HIGH (ref 70–99)
Potassium: 3.9 mmol/L (ref 3.5–5.1)
Sodium: 133 mmol/L — ABNORMAL LOW (ref 135–145)
Total Bilirubin: 0.9 mg/dL (ref 0.3–1.2)
Total Protein: 7.7 g/dL (ref 6.5–8.1)

## 2023-04-06 LAB — HEMOGLOBIN A1C
Hgb A1c MFr Bld: 6 % — ABNORMAL HIGH (ref 4.8–5.6)
Mean Plasma Glucose: 125.5 mg/dL

## 2023-04-06 LAB — ETHANOL: Alcohol, Ethyl (B): <10 mg/dL (ref <10)

## 2023-04-06 LAB — DIFFERENTIAL
Abs Immature Granulocytes: 0.06 10*3/uL (ref 0.00–0.07)
Basophils Absolute: 0.1 10*3/uL (ref 0.0–0.1)
Basophils Relative: 1 %
Eosinophils Absolute: 0.2 10*3/uL (ref 0.0–0.5)
Eosinophils Relative: 2 %
Immature Granulocytes: 1 %
Lymphocytes Relative: 25 %
Lymphs Abs: 2.6 10*3/uL (ref 0.7–4.0)
Monocytes Absolute: 1.1 10*3/uL — ABNORMAL HIGH (ref 0.1–1.0)
Monocytes Relative: 10 %
Neutro Abs: 6.6 10*3/uL (ref 1.7–7.7)
Neutrophils Relative %: 61 %

## 2023-04-06 LAB — LIPID PANEL
Cholesterol: 204 mg/dL — ABNORMAL HIGH (ref 0–200)
HDL: 53 mg/dL (ref >40)
LDL Cholesterol: 132 mg/dL — ABNORMAL HIGH (ref 0–99)
Total CHOL/HDL Ratio: 3.8 RATIO
Triglycerides: 95 mg/dL (ref <150)
VLDL: 19 mg/dL (ref 0–40)

## 2023-04-06 LAB — CBG MONITORING, ED
Glucose-Capillary: 124 mg/dL — ABNORMAL HIGH (ref 70–99)
Glucose-Capillary: 115 mg/dL — ABNORMAL HIGH (ref 70–99)

## 2023-04-06 LAB — APTT: aPTT: 30 seconds (ref 24–36)

## 2023-04-06 LAB — PROTIME-INR
INR: 1.1 (ref 0.8–1.2)
Prothrombin Time: 14.2 seconds (ref 11.4–15.2)

## 2023-04-06 MED ORDER — GABAPENTIN 100 MG PO CAPS
100.0000 mg | ORAL_CAPSULE | Freq: Two times a day (BID) | ORAL | Status: DC
  Administered 2023-04-06 – 2023-04-18 (×25): 100 mg via ORAL
  Filled 2023-04-06 (×27): qty 1

## 2023-04-06 MED ORDER — ACETAMINOPHEN 160 MG/5ML PO SOLN: 650.0000 mg | ORAL | Status: DC | PRN

## 2023-04-06 MED ORDER — NITROFURANTOIN MONOHYD MACRO 100 MG PO CAPS
100.0000 mg | ORAL_CAPSULE | Freq: Every day | ORAL | Status: DC
  Administered 2023-04-06 – 2023-04-17 (×11): 100 mg via ORAL
  Filled 2023-04-06 (×13): qty 1

## 2023-04-06 MED ORDER — PANTOPRAZOLE SODIUM 40 MG PO TBEC
40.0000 mg | DELAYED_RELEASE_TABLET | Freq: Every day | ORAL | Status: DC
  Administered 2023-04-06 – 2023-04-18 (×13): 40 mg via ORAL
  Filled 2023-04-06 (×13): qty 1

## 2023-04-06 MED ORDER — CLOPIDOGREL BISULFATE 75 MG PO TABS
75.0000 mg | ORAL_TABLET | Freq: Every day | ORAL | Status: DC
  Administered 2023-04-06 – 2023-04-18 (×13): 75 mg via ORAL
  Filled 2023-04-06 (×13): qty 1

## 2023-04-06 MED ORDER — ASPIRIN 325 MG PO TABS
325.0000 mg | ORAL_TABLET | Freq: Every day | ORAL | Status: DC
  Administered 2023-04-06: 325 mg via ORAL
  Filled 2023-04-06 (×2): qty 1

## 2023-04-06 MED ORDER — LEVOTHYROXINE SODIUM 50 MCG PO TABS
50.0000 ug | ORAL_TABLET | Freq: Every day | ORAL | Status: DC
  Administered 2023-04-07 – 2023-04-18 (×11): 50 ug via ORAL
  Filled 2023-04-06 (×11): qty 1

## 2023-04-06 MED ORDER — ACETAMINOPHEN 325 MG PO TABS
650.0000 mg | ORAL_TABLET | ORAL | Status: DC | PRN
  Administered 2023-04-09 – 2023-04-18 (×7): 650 mg via ORAL
  Filled 2023-04-06 (×7): qty 2

## 2023-04-06 MED ORDER — ACETAMINOPHEN 650 MG RE SUPP: 650.0000 mg | RECTAL | Status: DC | PRN

## 2023-04-06 MED ORDER — ASPIRIN 300 MG RE SUPP
300.0000 mg | Freq: Every day | RECTAL | Status: DC
  Filled 2023-04-06: qty 1

## 2023-04-06 MED ORDER — ENOXAPARIN SODIUM 40 MG/0.4ML IJ SOSY
40.0000 mg | PREFILLED_SYRINGE | INTRAMUSCULAR | Status: DC
  Administered 2023-04-06 – 2023-04-17 (×11): 40 mg via SUBCUTANEOUS
  Filled 2023-04-06 (×12): qty 0.4

## 2023-04-06 MED ORDER — GADOBUTROL 1 MMOL/ML IV SOLN
7.5000 mL | Freq: Once | INTRAVENOUS | Status: AC | PRN
  Administered 2023-04-06: 7.5 mL via INTRAVENOUS

## 2023-04-06 MED ORDER — SODIUM CHLORIDE 0.9% FLUSH
3.0000 mL | Freq: Once | INTRAVENOUS | Status: AC
  Administered 2023-04-06: 3 mL via INTRAVENOUS

## 2023-04-06 MED ORDER — DONEPEZIL HCL 5 MG PO TABS
5.0000 mg | ORAL_TABLET | Freq: Every day | ORAL | Status: DC
  Administered 2023-04-06 – 2023-04-17 (×11): 5 mg via ORAL
  Filled 2023-04-06 (×12): qty 1

## 2023-04-06 MED ORDER — SENNOSIDES-DOCUSATE SODIUM 8.6-50 MG PO TABS: 1.0000 | ORAL_TABLET | Freq: Every evening | ORAL | Status: DC | PRN

## 2023-04-06 MED ORDER — STROKE: EARLY STAGES OF RECOVERY BOOK: Freq: Once | Status: AC

## 2023-04-06 NOTE — Assessment & Plan Note (Signed)
Renal function currently at baseline.  Will recheck tomorrow morning after receiving contrast.  -BMP in the a.m.

## 2023-04-06 NOTE — Assessment & Plan Note (Signed)
-   Continue chronic Macrobid

## 2023-04-06 NOTE — ED Triage Notes (Signed)
Pt to ED via POV. Pts LWK this morning at 0730. Pt reports that she was woke up this morning normal. Pt was sitting in the chair drinking her coffee and then she was not able to get up out of the chair due to left side weakness. Pt dtr reports hx/o TIA. No slurred speech noted. Pt has minor drift on the left leg and left arm. Pt has decreased grip on left.

## 2023-04-06 NOTE — Assessment & Plan Note (Deleted)
Patient is presenting with left leg weakness that began suddenly at 9:30 AM.  Previous history of CVA in 2017 with similar symptoms that was treated with DAPT for several years.  Currently only on Plavix.  - Neurology consulted; appreciate their recommendations - MRI brain pending - MRA head/neck pending - Telemetry monitoring - Allow for permissive HTN (systolic < 220 and diastolic < 120)  - Echocardiogram  - A1C and Lipid panel  - ASA 325 mg once followed by 81 mg daily - Patient is unable to tolerate statins - PT/OT/SLP

## 2023-04-06 NOTE — Assessment & Plan Note (Signed)
-   Hold home antihypertensives to allow for permissive hypertension 

## 2023-04-06 NOTE — H&P (Addendum)
History and Physical    Patient: Sarah Freeman:[id number] DOB: December 11, 1930 DOA: 04/06/2023 DOS: the patient was seen and examined on 04/06/2023 PCP: Dorcas Carrow, DO  Patient coming from: Home  Chief Complaint:  Chief Complaint  Patient presents with   Weakness   Code Stroke   HPI: Sarah Freeman is a 89 y.o. female with medical history significant of dementia, prior CVA in 2017, hypertension, hyperlipidemia, bilateral carotid artery stenosis, hypothyroidism, stage III CKD, GERD, aortic atherosclerosis, who presents to the ED due to left leg weakness.  History was obtained from both patient and her family at bedside.  [name] stated that she was unable to get up on her own from her chair approximately 9:30 AM today.  She called her son who is coming to check on her and he tried to help her up and also noted that she was having increased left leg weakness.  Due to persistent symptoms, EMS was called.  She denies any other symptoms at this time and states that she fell this otherwise well.  In particular, she denies any upper extremity weakness, difficulty speaking or swallowing.  She denies any chest pain, palpitations, nausea, vomiting.  ED course: On arrival to the ED, patient was hypertensive at 142/86 with heart rate of 74.  She was saturating at 100% on room air.  She was afebrile at 98.2.  Initial workup notable for WBC of 10.6, sodium 133, glucose 125, creatinine 0.96 with GFR 56.  CT head was obtained with no evidence of acute intracranial abnormality.  Neurology consulted as part of code stroke.  TRH contacted for admission.  Review of Systems: As mentioned in the history of present illness. All other systems reviewed and are negative.  Past Medical History:  Diagnosis Date   Hyperlipidemia    Hypothyroid    Stroke [facility])    Urinary incontinence    Past Surgical History:  Procedure Laterality Date   BREAST BIOPSY Left 2000?   neg. dr. R[place]ford Nail office    CHOLECYSTECTOMY  1964   COLONOSCOPY     Dr Maryruth Bun   DILATION AND CURETTAGE OF UTERUS     EYE SURGERY     LOOP RECORDER INSERTION N/A 02/09/2018   Procedure: LOOP RECORDER INSERTION;  Surgeon: Duke Salvia, MD;  Location: [facility] INVASIVE CV LAB;  Service: Cardiovascular;  Laterality: N/A;   SKIN CANCER EXCISION     face   TEE WITHOUT CARDIOVERSION N/A 02/09/2018   Procedure: TRANSESOPHAGEAL ECHOCARDIOGRAM (TEE);  Surgeon: Antonieta Iba, MD;  Location: [facility] ORS;  Service: Cardiovascular;  Laterality: N/A;   TOTAL ABDOMINAL HYSTERECTOMY W/ BILATERAL SALPINGOOPHORECTOMY     age 58   Social History:  reports that she has never smoked. She has never been exposed to tobacco smoke. She has never used smokeless tobacco. She reports that she does not drink alcohol and does not use drugs.  Allergies  Allergen Reactions   Statins Other (See Comments)    Leg cramps Leg cramps   Bacitracin    Neomycin    Neosporin + Pain Relief Max St  [Neomy-Bacit-Polymyx-Pramoxine]    Polymyxin B    Neomycin-Polymyxin-Gramicidin Other (See Comments)    Pt states wounds do not heal with neosporin Pt states wounds do not heal with neosporin    Family History  Problem Relation Age of Onset   Cancer Father        prostate   Stroke Father    Hypertension Sister    Hyperlipidemia  Daughter    Heart disease Son    Diabetes Maternal Grandmother    Heart disease Maternal Grandmother    Heart disease Maternal Grandfather    Heart disease Daughter    CAD Mother    Failure to thrive Mother    Breast cancer Neg Hx     Prior to Admission medications   Medication Sig Start Date End Date Taking? Authorizing Provider  Cholecalciferol (VITAMIN D3) 10 MCG (400 UNIT) CAPS     [provider]  clopidogrel (PLAVIX) 75 MG tablet Take 1 tablet (75 mg total) by mouth daily. 01/04/23   Johnson, Megan P, DO  donepezil (ARICEPT) 5 MG tablet Take 1 tablet (5 mg total) by mouth at bedtime. 01/04/23   Johnson, Megan  P, DO  esomeprazole (NEXIUM) 40 MG capsule Take 1 capsule (40 mg total) by mouth daily. 01/04/23   Johnson, Megan P, DO  gabapentin (NEURONTIN) 100 MG capsule TAKE 2 CAPSULE(100 MG) BY MOUTH IN THE AM and 1 AT PM 01/04/23   Johnson, Megan P, DO  levothyroxine (SYNTHROID) 50 MCG tablet Take 1 tablet (50 mcg total) by mouth daily before breakfast. 01/10/23   Johnson, Megan P, DO  nitrofurantoin, macrocrystal-monohydrate, (MACROBID) 100 MG capsule Take 1 capsule (100 mg total) by mouth at bedtime. Patient not taking: Reported on 02/01/2023 11/08/22   Alfredo Martinez, MD  pantoprazole (PROTONIX) 40 MG tablet Take 1 tablet (40 mg total) by mouth daily. 01/10/23   Johnson, Megan P, DO  polyethylene glycol (MIRALAX / GLYCOLAX) 17 g packet Take 17 g by mouth daily.    [provider]  PROLIA 60 MG/ML SOSY injection Inject 60 mg into the skin every 6 (six) months. 05/22/20   [provider]    Physical Exam: Vitals:   04/06/23 1122  BP: (!) 142/86  Pulse: 100  Resp: 16  Temp: 98.2 F (36.8 C)  TempSrc: Oral  SpO2: 100%   Physical Exam Vitals and nursing note reviewed.  Constitutional:      General: She is not in acute distress.    Appearance: She is obese. She is not toxic-appearing.  HENT:     Head: Normocephalic and atraumatic.     Mouth/Throat:     Mouth: Mucous membranes are moist.     Pharynx: Oropharynx is clear.  Eyes:     Conjunctiva/sclera: Conjunctivae normal.     Pupils: Pupils are equal, round, and reactive to light.  Cardiovascular:     Rate and Rhythm: Normal rate and regular rhythm.     Heart sounds: No murmur heard.    No gallop.  Pulmonary:     Effort: Pulmonary effort is normal. No respiratory distress.     Breath sounds: Normal breath sounds. No wheezing, rhonchi or rales.  Abdominal:     General: Bowel sounds are normal.     Palpations: Abdomen is soft.  Musculoskeletal:     Right lower leg: No edema.     Left lower leg: No edema.  Skin:     General: Skin is warm and dry.  Neurological:     Mental Status: She is alert.     Comments:  Alert and oriented to person, place, time but needed prompting to recall why she came in today.  Seems to have difficulty with short-term memory. No dysarthria or facial asymmetry 5 out of 5 strength throughout Gross sensation intact  Psychiatric:        Mood and Affect: Mood normal.  Behavior: Behavior normal.    Data Reviewed: CBC with WBC of 10.6, hemoglobin 13.7, platelets 355 CMP with sodium of 133, potassium 3.9, bicarb 24, glucose 125, BUN 10, creatinine 0.96, AST 23, ALT 16, and GFR 56 INR within normal limits at 1.1 PTT within normal limits at 30 Alcohol level negative  EKG personally reviewed.  Sinus rhythm with rate of 75.  No ischemic appearing ST or T wave changes.  CT HEAD CODE STROKE WO CONTRAST  Result Date: 04/06/2023 CLINICAL DATA:  Code stroke.  Left-sided weakness EXAM: CT HEAD WITHOUT CONTRAST TECHNIQUE: Contiguous axial images were obtained from the base of the skull through the vertex without intravenous contrast. RADIATION DOSE REDUCTION: This exam was performed according to the departmental dose-optimization program which includes automated exposure control, adjustment of the mA and/or kV according to patient size and/or use of iterative reconstruction technique. COMPARISON:  MRI Brain 09/25/20 FINDINGS: Brain: No hemorrhage. Sequela of moderate chronic microvascular ischemic change with a chronic right frontal lobe infarct. No CT evidence of an acute cortical infarct. No hydrocephalus. No extra-axial fluid collection Vascular: Calcification in the right sylvian fissure (series 120 is unchanged prior. Skull: Normal. Negative for fracture or focal lesion.2 Sinuses/Orbits: No middle ear or mastoid effusion. Paranasal sinuses are clear. Bilateral lens replacement. Orbits are otherwise unremarkable Other: None ASPECTS ([place] Stroke Program Early CT Score): 10 IMPRESSION:  No hemorrhage or CT evidence of an acute cortical infarct. Aspects is 10 when accounting for chronic findings. Findings were paged to Dr. Selina Cooley on 04/06/23 at 11:51 AM. Electronically Signed   By: Lorenza Cambridge M.D.   On: 04/06/2023 11:52    Results are pending, will review when available.  Assessment and Plan:  * TIA (transient ischemic attack) Patient is presenting with left leg weakness that began suddenly at 9:30 AM.  Currently nearly resolved. Previous history of CVA in 2017 with similar symptoms that was treated with DAPT for several years.  Currently only on Plavix.   - Neurology consulted; appreciate their recommendations - MRI brain pending - MRA head/neck pending - Telemetry monitoring - Allow for permissive HTN (systolic < 220 and diastolic < 120)  - Echocardiogram  - A1C and Lipid panel  - ASA 325 mg once followed by 81 mg daily - Patient is unable to tolerate statins - PT/OT/SLP  Memory loss Likely vascular in nature given extensive small vessel disease on previous MRIs.  HTN (hypertension) - Hold home antihypertensives to allow for permissive hypertension  Chronic kidney disease, stage 3 (HCC) Renal function currently at baseline.  Will recheck tomorrow morning after receiving contrast.  -BMP in the a.m.  Chronic UTI - Continue chronic Macrobid  Hypothyroidism - Continue home levothyroxine  Advance Care Planning:   Code Status: DNR/DNI.  Patient states she would not want to be on any mechanical life support in the case of cardiac or pulmonary arrest.  Her family at bedside concurs that this is something they have discussed.  Consults: Neurology  Family Communication: Patient's 2 daughters and son updated at bedside  Severity of Illness: The appropriate patient status for this patient is OBSERVATION. Observation status is judged to be reasonable and necessary in order to provide the required intensity of service to ensure the patient's safety. The patient's  presenting symptoms, physical exam findings, and initial radiographic and laboratory data in the context of their medical condition is felt to place them at decreased risk for further clinical deterioration. Furthermore, it is anticipated that the patient will be  medically stable for discharge from the hospital within 2 midnights of admission.   Author: Verdene Lennert, MD 04/06/2023 2:44 PM  For on call review [email or web address].

## 2023-04-06 NOTE — Assessment & Plan Note (Addendum)
Patient is presenting with left leg weakness that began suddenly at 9:30 AM.  Currently nearly resolved. Previous history of CVA in 2017 with similar symptoms that was treated with DAPT for several years.  Currently only on Plavix.   - Neurology consulted; appreciate their recommendations - MRI brain pending - MRA head/neck pending - Telemetry monitoring - Allow for permissive HTN (systolic < 220 and diastolic < 120)  - Echocardiogram  - A1C and Lipid panel  - ASA 325 mg once followed by 81 mg daily - Patient is unable to tolerate statins - PT/OT/SLP

## 2023-04-06 NOTE — Assessment & Plan Note (Signed)
Continue home levothyroxine 

## 2023-04-06 NOTE — Assessment & Plan Note (Signed)
Likely vascular in nature given extensive small vessel disease on previous MRIs.

## 2023-04-06 NOTE — ED Notes (Addendum)
First Nurse Note: Patient to ED via [facility] from home for leg weakness. Patient normally can walk but not able to walk today. Hx of dementia and hard or hearing.

## 2023-04-06 NOTE — ED Provider Notes (Signed)
[facility] Provider Note    Event Date/Time   First MD Initiated Contact with Patient 04/06/23 1124     (approximate)   History   Weakness and Code Stroke   HPI  Sarah Freeman is a 89 y.o. female presenting to the emergency department as a code stroke.  Patient woke up this morning around 730am and felt normal.  She had her breakfast and sat down in her chair.  She then had onset of weakness around 930 primarily on her left side and was unable to get out of her chair by herself.  Her son checked on her around 10 AM and she continued to have left-sided weakness.  She presented to the ER and was activated as a code stroke in triage.  On my reevaluation after code stroke imaging, patient reports that she feels that her weakness has resolved.  Denies other complaints.     Physical Exam   Triage Vital Signs: ED Triage Vitals  Enc Vitals Group     BP 04/06/23 1122 (!) 142/86     Pulse Rate 04/06/23 1122 100     Resp 04/06/23 1122 16     Temp 04/06/23 1122 98.2 F (36.8 C)     Temp Source 04/06/23 1122 Oral     SpO2 04/06/23 1122 100 %     Weight --      Height --      Head Circumference --      Peak Flow --      Pain Score 04/06/23 1147 0     Pain Loc --      Pain Edu? --      Excl. in GC? --     Most recent vital signs: Vitals:   04/06/23 1122 04/06/23 1541  BP: (!) 142/86 (!) 154/81  Pulse: 100 79  Resp: 16 15  Temp: 98.2 F (36.8 C)   SpO2: 100% 97%     General: Awake, interactive  CV:  Regular rate, good peripheral perfusion.  Resp:  Lungs clear, unlabored respirations.  Abd:  Soft, nondistended. Neuro:  Keenly aware, correctly answers month and age, following commands, normal extraocular movements, no visual field loss, normal facial symmetry, very slight drift of left arm, does not hit bed, no drift of remainder of extremities.  No appreciable ataxia.  Normal sensation.  No aphasia, dysarthria, inattention.   ED Results /  Procedures / Treatments   Labs (all labs ordered are listed, but only abnormal results are displayed) Labs Reviewed  CBC - Abnormal; Notable for the following components:      Result Value   WBC 10.6 (*)    All other components within normal limits  DIFFERENTIAL - Abnormal; Notable for the following components:   Monocytes Absolute 1.1 (*)    All other components within normal limits  COMPREHENSIVE METABOLIC PANEL - Abnormal; Notable for the following components:   Sodium 133 (*)    Glucose, Bld 125 (*)    Calcium 8.7 (*)    GFR, Estimated 56 (*)    All other components within normal limits  LIPID PANEL - Abnormal; Notable for the following components:   Cholesterol 204 (*)    LDL Cholesterol 132 (*)    All other components within normal limits  CBG MONITORING, ED - Abnormal; Notable for the following components:   Glucose-Capillary 124 (*)    All other components within normal limits  CBG MONITORING, ED - Abnormal; Notable for the following components:  Glucose-Capillary 115 (*)    All other components within normal limits  PROTIME-INR  APTT  ETHANOL  HEMOGLOBIN A1C     EKG EKG independently reviewed interpreted by myself (ER attending) demonstrates:  EKG demonstrates sinus rhythm at a rate of 75, PR 136, QRS 82, no acute ST changes  RADIOLOGY Imaging independently reviewed and interpreted by myself demonstrates: No evidence of acute bleed, agree with radiology interpretation of no hemorrhage or evidence of acute infarct.   PROCEDURES:  Critical Care performed: Yes, see critical care procedure note(s)  Procedures CRITICAL CARE Performed by: Trinna Post   Total critical care time: 30 minutes  Critical care time was exclusive of separately billable procedures and treating other patients.  Critical care was necessary to treat or prevent imminent or life-threatening deterioration.  Critical care was time spent personally by me on the following activities:  development of treatment plan with patient and/or surrogate as well as nursing, discussions with consultants, evaluation of patient's response to treatment, examination of patient, obtaining history from patient or surrogate, ordering and performing treatments and interventions, ordering and review of laboratory studies, ordering and review of radiographic studies, pulse oximetry and re-evaluation of patient's condition.    MEDICATIONS ORDERED IN ED: Medications   stroke: early stages of recovery book (has no administration in time range)  acetaminophen (TYLENOL) tablet 650 mg (has no administration in time range)    Or  acetaminophen (TYLENOL) 160 MG/5ML solution 650 mg (has no administration in time range)    Or  acetaminophen (TYLENOL) suppository 650 mg (has no administration in time range)  senna-docusate (Senokot-S) tablet 1 tablet (has no administration in time range)  enoxaparin (LOVENOX) injection 40 mg (has no administration in time range)  aspirin suppository 300 mg ( Rectal See Alternative 04/06/23 1520)    Or  aspirin tablet 325 mg (325 mg Oral Given 04/06/23 1520)  clopidogrel (PLAVIX) tablet 75 mg (75 mg Oral Given 04/06/23 1520)  donepezil (ARICEPT) tablet 5 mg (has no administration in time range)  gabapentin (NEURONTIN) capsule 100 mg (100 mg Oral Given 04/06/23 1520)  levothyroxine (SYNTHROID) tablet 50 mcg (has no administration in time range)  pantoprazole (PROTONIX) EC tablet 40 mg (40 mg Oral Given 04/06/23 1521)  nitrofurantoin (macrocrystal-monohydrate) (MACROBID) capsule 100 mg (has no administration in time range)  sodium chloride flush (NS) 0.9 % injection 3 mL (3 mLs Intravenous Given 04/06/23 1145)     IMPRESSION / MDM / ASSESSMENT AND PLAN / ED COURSE  I reviewed the triage vital signs and the nursing notes.  Differential diagnosis includes, but is not limited to, acute CVA, TIA, recrudescence of prior injury  Patient's presentation is most consistent with acute  presentation with potential threat to life or bodily function.  89 year old female presenting as a code stroke with left-sided weakness.  Imaging and lab work with significant acute abnormalities.  TNK was considered, but given minimal symptoms (now largely resolved), this was not administered after discussion with neurology.  Neurology team did recommend admission to medicine service for MRI and further evaluation.  Will reach out to hospitalist team.  Case discussed with hospitalist team.  They will evaluate the patient for anticipated admission.  FINAL CLINICAL IMPRESSION(S) / ED DIAGNOSES   Final diagnoses:  Left-sided weakness     Rx / DC Orders   ED Discharge Orders     None        Note:  This document was prepared using Dragon voice recognition software and may include  unintentional dictation errors.   Trinna Post, MD 04/06/23 [phone]

## 2023-04-06 NOTE — Consult Note (Signed)
NEUROLOGY CONSULTATION NOTE   Date of service: Apr 06, 2023 Patient Name: Sarah Freeman MRN:  [id number] DOB:  November 11, 1931 Reason for consult: stroke code Requesting physician: Dr. Cyril Loosen _ _ _   _ __   _ __ _ _  __ __   _ __   __ _  History of Present Illness   This is a 89 year-old woman with medical history significant for dementia, prior CVA in 2017, hypertension, hyperlipidemia, bilateral carotid stenosis, hypothyroidism, CKD stage III who presented to the ED after acute onset of left leg weakness.  Last known well was 7:30 AM when she was drinking coffee at the table.  She then was unable to stand up and family noticed the left facial droop and left leg.  Her symptoms are much improved at this time and she only has mild left leg drift and got the month wrong. NIHSS = 2. Head CT showed no acute process on personal review. TNK not administered 2/2 sx too mild to treat.   ROS   Per HPI: all other systems reviewed and are negative  Past History   I have reviewed the following:  Past Medical History:  Diagnosis Date   Hyperlipidemia    Hypothyroid    Stroke [facility])    Urinary incontinence    Past Surgical History:  Procedure Laterality Date   BREAST BIOPSY Left 2000?   neg. dr. [place] Nail office   CHOLECYSTECTOMY  1964   COLONOSCOPY     Dr Maryruth Bun   DILATION AND CURETTAGE OF UTERUS     EYE SURGERY     LOOP RECORDER INSERTION N/A 02/09/2018   Procedure: LOOP RECORDER INSERTION;  Surgeon: Duke Salvia, MD;  Location: [facility] INVASIVE CV LAB;  Service: Cardiovascular;  Laterality: N/A;   SKIN CANCER EXCISION     face   TEE WITHOUT CARDIOVERSION N/A 02/09/2018   Procedure: TRANSESOPHAGEAL ECHOCARDIOGRAM (TEE);  Surgeon: Antonieta Iba, MD;  Location: [facility] ORS;  Service: Cardiovascular;  Laterality: N/A;   TOTAL ABDOMINAL HYSTERECTOMY W/ BILATERAL SALPINGOOPHORECTOMY     age 70   Family History  Problem Relation Age of Onset   Cancer Father        prostate   Stroke Father     Hypertension Sister    Hyperlipidemia Daughter    Heart disease Son    Diabetes Maternal Grandmother    Heart disease Maternal Grandmother    Heart disease Maternal Grandfather    Heart disease Daughter    CAD Mother    Failure to thrive Mother    Breast cancer Neg Hx    Social History   Socioeconomic History   Marital status: Widowed    Spouse name: Not on file   Number of children: Not on file   Years of education: Not on file   Highest education level: Not on file  Occupational History   Occupation: retired  Tobacco Use   Smoking status: Never    Passive exposure: Never   Smokeless tobacco: Never  Vaping Use   Vaping Use: Never used  Substance and Sexual Activity   Alcohol use: No    Alcohol/week: 0.0 standard drinks of alcohol   Drug use: No   Sexual activity: Not Currently  Other Topics Concern   Not on file  Social History Narrative   Not on file   Social Determinants of Health   Financial Resource Strain: Low Risk  (02/01/2023)   Overall Financial Resource Strain (CARDIA)  Difficulty of Paying Living Expenses: Not hard at all  Food Insecurity: No Food Insecurity (04/06/2023)   Hunger Vital Sign    Worried About Running Out of Food in the Last Year: Never true    Ran Out of Food in the Last Year: Never true  Transportation Needs: No Transportation Needs (04/06/2023)   PRAPARE - Administrator, Civil Service (Medical): No    Lack of Transportation (Non-Medical): No  Physical Activity: Insufficiently Active (02/01/2023)   Exercise Vital Sign    Days of Exercise per Week: 4 days    Minutes of Exercise per Session: 10 min  Stress: No Stress Concern Present (02/01/2023)   [employer] of Occupational Health - Occupational Stress Questionnaire    Feeling of Stress : Not at all  Social Connections: Moderately Isolated (02/01/2023)   Social Connection and Isolation Panel [NHANES]    Frequency of Communication with Friends and Family: More than three  times a week    Frequency of Social Gatherings with Friends and Family: More than three times a week    Attends Religious Services: More than 4 times per year    Active Member of [employer] or Organizations: No    Attends Banker Meetings: Never    Marital Status: Widowed   Allergies  Allergen Reactions   Statins Other (See Comments)    Leg cramps Leg cramps   Bacitracin    Neomycin    Neosporin + Pain Relief Max St  [Neomy-Bacit-Polymyx-Pramoxine]    Polymyxin B    Neomycin-Polymyxin-Gramicidin Other (See Comments)    Pt states wounds do not heal with neosporin Pt states wounds do not heal with neosporin    Medications   (Not in a hospital admission)     Current Facility-Administered Medications:    [START ON 04/07/2023]  stroke: early stages of recovery book, , Does not apply, Once, Verdene Lennert, MD   acetaminophen (TYLENOL) tablet 650 mg, 650 mg, Oral, Q4H PRN **OR** acetaminophen (TYLENOL) 160 MG/5ML solution 650 mg, 650 mg, Per Tube, Q4H PRN **OR** acetaminophen (TYLENOL) suppository 650 mg, 650 mg, Rectal, Q4H PRN, Verdene Lennert, MD   aspirin suppository 300 mg, 300 mg, Rectal, Daily **OR** aspirin tablet 325 mg, 325 mg, Oral, Daily, Verdene Lennert, MD   clopidogrel (PLAVIX) tablet 75 mg, 75 mg, Oral, Daily, Verdene Lennert, MD   donepezil (ARICEPT) tablet 5 mg, 5 mg, Oral, QHS, Verdene Lennert, MD   enoxaparin (LOVENOX) injection 40 mg, 40 mg, Subcutaneous, Q24H, Huel Cote, Iulia, MD   gabapentin (NEURONTIN) capsule 100 mg, 100 mg, Oral, BID, Verdene Lennert, MD   [START ON 04/07/2023] levothyroxine (SYNTHROID) tablet 50 mcg, 50 mcg, Oral, QAC breakfast, Verdene Lennert, MD   nitrofurantoin (macrocrystal-monohydrate) (MACROBID) capsule 100 mg, 100 mg, Oral, QHS, Basaraba, Iulia, MD   pantoprazole (PROTONIX) EC tablet 40 mg, 40 mg, Oral, Daily, Verdene Lennert, MD   senna-docusate (Senokot-S) tablet 1 tablet, 1 tablet, Oral, QHS PRN, Verdene Lennert, MD  Current  Outpatient Medications:    cholecalciferol (VITAMIN D3) 25 MCG (1000 UNIT) tablet, Take 1,000 Units by mouth daily., Disp: , Rfl:    clopidogrel (PLAVIX) 75 MG tablet, Take 1 tablet (75 mg total) by mouth daily., Disp: 90 tablet, Rfl: 1   donepezil (ARICEPT) 5 MG tablet, Take 1 tablet (5 mg total) by mouth at bedtime., Disp: 90 tablet, Rfl: 1   gabapentin (NEURONTIN) 100 MG capsule, TAKE 2 CAPSULE(100 MG) BY MOUTH IN THE AM and 1 AT PM, Disp: 270  capsule, Rfl: 1   levothyroxine (SYNTHROID) 50 MCG tablet, Take 1 tablet (50 mcg total) by mouth daily before breakfast., Disp: 90 tablet, Rfl: 3   nitrofurantoin, macrocrystal-monohydrate, (MACROBID) 100 MG capsule, Take 1 capsule (100 mg total) by mouth at bedtime., Disp: 90 capsule, Rfl: 3   pantoprazole (PROTONIX) 40 MG tablet, Take 1 tablet (40 mg total) by mouth daily., Disp: 90 tablet, Rfl: 1   polyethylene glycol (MIRALAX / GLYCOLAX) 17 g packet, Take 17 g by mouth daily., Disp: , Rfl:    PROLIA 60 MG/ML SOSY injection, Inject 60 mg into the skin every 6 (six) months., Disp: , Rfl:    esomeprazole (NEXIUM) 40 MG capsule, Take 1 capsule (40 mg total) by mouth daily. (Patient not taking: Reported on 04/06/2023), Disp: 90 capsule, Rfl: 1  Vitals   Vitals:   04/06/23 1122  BP: (!) 142/86  Pulse: 100  Resp: 16  Temp: 98.2 F (36.8 C)  TempSrc: Oral  SpO2: 100%     There is no height or weight on file to calculate BMI.  Physical Exam   Physical Exam Gen: A&O x4, NAD HEENT: Atraumatic, normocephalic;mucous membranes moist; oropharynx clear, tongue without atrophy or fasciculations. Neck: Supple, trachea midline. Resp: CTAB, no w/r/r CV: RRR, no m/g/r; nml S1 and S2. 2+ symmetric peripheral pulses. Abd: soft/NT/ND; nabs x 4 quad Extrem: Nml bulk; no cyanosis, clubbing, or edema.  Neuro: *MS: oriented to self, hospital, and age but not month. Follows multi-step commands.  *Speech: fluid, nondysarthric, able to name and repeat *CN:     I: Deferred   II,III: PERRLA, VFF by confrontation, optic discs unable to be visualized 2/2 pupillary constriction   III,IV,VI: EOMI w/o nystagmus, no ptosis   V: Sensation intact from V1 to V3 to LT   VII: Eyelid closure was full.  Smile symmetric.   VIII: Hearing intact to voice   IX,X: Voice normal, palate elevates symmetrically    XI: SCM/trap 5/5 bilat   XII: Tongue protrudes midline, no atrophy or fasciculations   *Motor:   Normal bulk.  No tremor, rigidity or bradykinesia. Mild LLE drift, no drift in any other extremity *Sensory: Intact to light touch, pinprick, temperature vibration throughout. Symmetric. Propioception intact bilat.  No double-simultaneous extinction.  *Coordination:  Finger-to-nose, heel-to-shin, rapid alternating motions were intact. *Reflexes:  2+ and symmetric throughout without clonus; toes down-going bilat *Gait: deferred  NIHSS = 2 for questions and LLE drift  Premorbid mRS = 2   Labs   CBC:  Recent Labs  Lab 04/06/23 1136  WBC 10.6*  NEUTROABS 6.6  HGB 13.7  HCT 41.5  MCV 94.1  PLT 355    Basic Metabolic Panel:  Lab Results  Component Value Date   NA 133 (L) 04/06/2023   K 3.9 04/06/2023   CO2 24 04/06/2023   GLUCOSE 125 (H) 04/06/2023   BUN 10 04/06/2023   CREATININE 0.96 04/06/2023   CALCIUM 8.7 (L) 04/06/2023   GFRNONAA 56 (L) 04/06/2023   GFRAA 77 06/16/2020   Lipid Panel:  Lab Results  Component Value Date   LDLCALC 132 (H) 04/06/2023   HgbA1c:  Lab Results  Component Value Date   HGBA1C 6.1 (H) 01/18/2018   Urine Drug Screen:     Component Value Date/Time   LABOPIA NONE DETECTED 01/18/2018 1925   COCAINSCRNUR NONE DETECTED 01/18/2018 1925   LABBENZ NONE DETECTED 01/18/2018 1925   AMPHETMU NONE DETECTED 01/18/2018 1925   THCU NONE DETECTED 01/18/2018 1925  LABBARB NONE DETECTED 01/18/2018 1925    Alcohol Level     Component Value Date/Time   ETH <10 04/06/2023 1136    CT Head without contrast: No acute  process on personal review   Impression   This is a 89 year-old woman with medical history significant for dementia, prior CVA in 2017, hypertension, hyperlipidemia, bilateral carotid stenosis, hypothyroidism, CKD stage III who presented to the ED after acute onset of left leg weakness c/f acute ischemic stroke vs TIA. TNK not administered 2/2 sx too mild to treat.  Recommendations   - Admit for stroke workup - Permissive HTN x48 hrs from sx onset or until stroke ruled out by MRI goal BP <220/110. PRN labetalol or hydralazine if BP above these parameters. Avoid oral antihypertensives. - MRI brain wo contrast - CTA or MRA H&N  - TTE  - Check A1c and LDL + add statin per guidelines - ASA 81mg  daily + plavix 75mg  daily x21 days f/b plavix 75mg  daily monotherapy after that - q4 hr neuro checks - STAT head CT for any change in neuro exam - Tele - PT/OT/SLP - Stroke education - Amb referral to neurology upon discharge   Will continue to follow  ______________________________________________________________________   Thank you for the opportunity to take part in the care of this patient. If you have any further questions, please contact the neurology consultation attending.  Signed,  Bing Neighbors, MD [facility] [phone]  If 7pm- 7am, please page neurology on call as listed in AMION.  **Any copied and pasted documentation in this note was written by me in another application not billed for and pasted by me into this document.

## 2023-04-06 NOTE — Progress Notes (Signed)
Code stroke activated per elert @ 1138.  Pt in CT, reports left side weakness with hx of TIA, LKWT 0730 per report. No asa or blood thinners. MRS 1.  Dr Selina Cooley at bedside in CT to assess patient. No TNK, TSRN off camera at 1138.

## 2023-04-06 NOTE — Code Documentation (Signed)
CODE STROKE- PHARMACY COMMUNICATION   Time CODE STROKE called/page received:1130  Time response to CODE STROKE was made (in person or via phone): 1135  Time Stroke Kit retrieved from Pyxis (only if needed): N/A  Name of Provider/Nurse contacted: Dr. Selina Cooley  Past Medical History:  Diagnosis Date   Hyperlipidemia    Hypothyroid    Stroke [facility])    Urinary incontinence    Prior to Admission medications   Medication Sig Start Date End Date Taking? Authorizing Provider  Cholecalciferol (VITAMIN D3) 10 MCG (400 UNIT) CAPS     [provider]  clopidogrel (PLAVIX) 75 MG tablet Take 1 tablet (75 mg total) by mouth daily. 01/04/23   Johnson, Megan P, DO  donepezil (ARICEPT) 5 MG tablet Take 1 tablet (5 mg total) by mouth at bedtime. 01/04/23   Johnson, Megan P, DO  esomeprazole (NEXIUM) 40 MG capsule Take 1 capsule (40 mg total) by mouth daily. 01/04/23   Johnson, Megan P, DO  gabapentin (NEURONTIN) 100 MG capsule TAKE 2 CAPSULE(100 MG) BY MOUTH IN THE AM and 1 AT PM 01/04/23   Johnson, Megan P, DO  levothyroxine (SYNTHROID) 50 MCG tablet Take 1 tablet (50 mcg total) by mouth daily before breakfast. 01/10/23   Johnson, Megan P, DO  nitrofurantoin, macrocrystal-monohydrate, (MACROBID) 100 MG capsule Take 1 capsule (100 mg total) by mouth at bedtime. Patient not taking: Reported on 02/01/2023 11/08/22   Alfredo Martinez, MD  pantoprazole (PROTONIX) 40 MG tablet Take 1 tablet (40 mg total) by mouth daily. 01/10/23   Johnson, Megan P, DO  polyethylene glycol (MIRALAX / GLYCOLAX) 17 g packet Take 17 g by mouth daily.    [provider]  PROLIA 60 MG/ML SOSY injection Inject 60 mg into the skin every 6 (six) months. 05/22/20   [provider]    Barrie Folk ,PharmD Clinical Pharmacist  04/06/2023  11:43 AM

## 2023-04-06 NOTE — Evaluation (Signed)
Physical Therapy Evaluation Patient Details Name: Sarah Freeman MRN: [id number] DOB: 08/19/1931 Today's Date: 04/06/2023  History of Present Illness  Sarah Freeman is a 89yoF who comes to [facility] after acute onset insidious left hemi motor weakness while drinking coffee at breakfast. Symptoms largely resolved by time of PT orders. Head CT not revealing of any acute process.  Clinical Impression  Pt in transport from ED to 1C upon encounter, author follows pt to room, assists with transport chair to bed, then additional mobility for assessment. Pt agreeable to assessment, demonstrates basic ADL mobility, endorses no acute impairment, has generally good confidence with movement, happy to show her abilities. Pt AMB to door and back twice without device, as she reports none used a baseline, but she is cautiously aware of objects in room and has hands hovering in place as needed for sudden need for self stabilization. Pt assisted back to bed, made comfy. Connected pt to dietary for dinner order. RN updated. Will continue to follow due to advanced age and likelihood of deconditioning with a prolonged admission and the need to return to living alone mostly independent at DC. Pt seems likely at her functional baseline at this time unless a higher degree of detail could be provided from a known contact.      Recommendations for follow up therapy are one component of a multi-disciplinary discharge planning process, led by the attending physician.  Recommendations may be updated based on patient status, additional functional criteria and insurance authorization.  Follow Up Recommendations       Assistance Recommended at Discharge None  Patient can return home with the following  Assist for transportation;Assistance with cooking/housework    Equipment Recommendations None recommended by PT  Recommendations for Other Services       Functional Status Assessment Patient has not had a recent decline in  their functional status     Precautions / Restrictions Precautions Precautions: Fall Restrictions Weight Bearing Restrictions: No      Mobility  Bed Mobility Overal bed mobility: Modified Independent Bed Mobility: Sit to Supine       Sit to supine: Modified independent (Device/Increase time)        Transfers Overall transfer level: Needs assistance Equipment used: None Transfers: Sit to/from Stand Sit to Stand: Supervision           General transfer comment: multiple transfers performed with confidence and moderate effort or less. no LOB, good force production.    Ambulation/Gait Ambulation/Gait assistance: Min guard Gait Distance (Feet): 60 Feet Assistive device: None Gait Pattern/deviations: WFL(Within Functional Limits)       General Gait Details: unsuprising steady asymetrical orthopedic antalgic gait of a barefoot 89yo. No frank gait features concerning of acute neurological weakness.  Stairs            Wheelchair Mobility    Modified Rankin (Stroke Patients Only)       Balance                                             Pertinent Vitals/Pain Pain Assessment Pain Assessment: No/denies pain    Home Living Family/patient expects to be discharged to:: Private residence Living Arrangements: Alone Available Help at Discharge: Family Type of Home: House Home Access: Stairs to enter Entrance Stairs-Rails: Left Entrance Stairs-Number of Steps: 3   Home Layout: One level   Additional Comments: unknown  at this time    Prior Function Prior Level of Function : Independent/Modified Independent             Mobility Comments: pt reports to AMB in house all day long, lives alone       Hand Dominance        Extremity/Trunk Assessment                Communication      Cognition Arousal/Alertness: Awake/alert Behavior During Therapy: WFL for tasks assessed/performed Overall Cognitive Status: Within  Functional Limits for tasks assessed                                          General Comments      Exercises     Assessment/Plan    PT Assessment Patient needs continued PT services  PT Problem List Decreased safety awareness;Decreased knowledge of precautions       PT Treatment Interventions Functional mobility training;Therapeutic activities;Therapeutic exercise    PT Goals (Current goals can be found in the Care Plan section)  Acute Rehab PT Goals Patient Stated Goal: 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 PT Goal Formulation: With patient Time For Goal Achievement: 04/20/23 Potential to Achieve Goals: Good    Frequency Min 2X/week     Co-evaluation               AM-PAC PT "6 Clicks" Mobility  Outcome Measure Help needed turning from your back to your side while in a flat bed without using bedrails?: None Help needed moving from lying on your back to sitting on the side of a flat bed without using bedrails?: None Help needed moving to and from a bed to a chair (including a wheelchair)?: A Little Help needed standing up from a chair using your arms (e.g., wheelchair or bedside chair)?: A Little Help needed to walk in hospital room?: A Little Help needed climbing 3-5 steps with a railing? : A Little 6 Click Score: 20    End of Session   Activity Tolerance: Patient tolerated treatment well;No increased pain Patient left: in bed;with bed alarm set;with call bell/phone within reach Nurse Communication: Mobility status PT Visit Diagnosis: Difficulty in walking, not elsewhere classified (R26.2);Unsteadiness on feet (R26.81);Other abnormalities of gait and mobility (R26.89)    Time: 1610-9604 PT Time Calculation (min) (ACUTE ONLY): 15 min   Charges:   PT Evaluation $PT Eval Low Complexity: 1 Low         4:05 PM, 04/06/23 Rosamaria Lints, PT, DPT Physical Therapist - [facility]   [phone] (ASCOM)    Dekota Kirlin C 04/06/2023, 3:59 PM

## 2023-04-06 NOTE — Progress Notes (Addendum)
SLP Cancellation Note  Patient Details Name: Sarah Freeman MRN: [id number] DOB: 1930/12/24   Cancelled treatment:       Reason Eval/Treat Not Completed: SLP screened, no needs identified, will sign off (chart reviewed; consulted NSG and met w/ Pt/Daughter in room.)  Pt and Daughter denied any difficulty swallowing. Pt passed her Yale swallow screen w/ NSG and is currently on a regular diet. Tolerates swallowing pills w/ water one at a time per NSG.  Pt conversed in conversation w/out gross expressive/receptive deficits noted -- pt has Baseline Dementia which does impact her communication abilities(requires cues and Time) per Daughter. Daughter stated she felt pt was speaking at "her baseline" as pt described what happened at home to be brought to the hospital("couldn't stand up b/c my Left leg was weak"). Daughter denied any overt speech-language deficits. Pt's speech is clear, intelligible. Pt followed 2-3 basic commands w/ cue. Pt indicated wants/needs. Noted Neurologist's note/assessment indicating success of communication skills at pt's baseline.   Discussed w/ both Daughter/pt and NSG that reducing distractions during tasks would be helpful in reducing any further agitation/confusion and would support follow-through w/ tasks. Encouraged pt's engagement in self feeding tasks, including Pills taking too.  No further Acute skilled ST services indicated as pt appears at her baseline. Daughter/pt agreed -- pt stated "I want to go home". NSG to reconsult if any change in status while admitted. MD updated.      Jerilynn Som, MS, CCC-SLP Speech Language Pathologist Rehab Services; [facility] [phone] (ascom) Jennyfer Nickolson 04/06/2023, 3:20 PM

## 2023-04-07 ENCOUNTER — Observation Stay (HOSPITAL_COMMUNITY)
Admit: 2023-04-07 | Discharge: 2023-04-07 | Disposition: A | Discharge status: Home / Self Care | Payer: Medicare Other | Attending: Student | Admitting: Student

## 2023-04-07 DIAGNOSIS — I6389 Other cerebral infarction: Secondary | ICD-10-CM

## 2023-04-07 DIAGNOSIS — I639 Cerebral infarction, unspecified: Secondary | ICD-10-CM | POA: Diagnosis present

## 2023-04-07 DIAGNOSIS — F05 Delirium due to known physiological condition: Secondary | ICD-10-CM | POA: Diagnosis not present

## 2023-04-07 DIAGNOSIS — Z6835 Body mass index (BMI) 35.0-35.9, adult: Secondary | ICD-10-CM | POA: Diagnosis not present

## 2023-04-07 DIAGNOSIS — Z79890 Hormone replacement therapy (postmenopausal): Secondary | ICD-10-CM | POA: Diagnosis not present

## 2023-04-07 DIAGNOSIS — E871 Hypo-osmolality and hyponatremia: Secondary | ICD-10-CM | POA: Diagnosis present

## 2023-04-07 DIAGNOSIS — Z66 Do not resuscitate: Secondary | ICD-10-CM | POA: Diagnosis present

## 2023-04-07 DIAGNOSIS — I6523 Occlusion and stenosis of bilateral carotid arteries: Secondary | ICD-10-CM | POA: Diagnosis present

## 2023-04-07 DIAGNOSIS — R29702 NIHSS score 2: Secondary | ICD-10-CM | POA: Diagnosis present

## 2023-04-07 DIAGNOSIS — G4700 Insomnia, unspecified: Secondary | ICD-10-CM | POA: Diagnosis present

## 2023-04-07 DIAGNOSIS — K5900 Constipation, unspecified: Secondary | ICD-10-CM | POA: Diagnosis not present

## 2023-04-07 DIAGNOSIS — E860 Dehydration: Secondary | ICD-10-CM | POA: Diagnosis present

## 2023-04-07 DIAGNOSIS — E785 Hyperlipidemia, unspecified: Secondary | ICD-10-CM | POA: Diagnosis present

## 2023-04-07 DIAGNOSIS — G8314 Monoplegia of lower limb affecting left nondominant side: Secondary | ICD-10-CM | POA: Diagnosis present

## 2023-04-07 DIAGNOSIS — M810 Age-related osteoporosis without current pathological fracture: Secondary | ICD-10-CM | POA: Diagnosis present

## 2023-04-07 DIAGNOSIS — I6381 Other cerebral infarction due to occlusion or stenosis of small artery: Secondary | ICD-10-CM | POA: Diagnosis present

## 2023-04-07 DIAGNOSIS — F03918 Unspecified dementia, unspecified severity, with other behavioral disturbance: Secondary | ICD-10-CM | POA: Diagnosis present

## 2023-04-07 DIAGNOSIS — E039 Hypothyroidism, unspecified: Secondary | ICD-10-CM | POA: Diagnosis present

## 2023-04-07 DIAGNOSIS — Z79899 Other long term (current) drug therapy: Secondary | ICD-10-CM | POA: Diagnosis not present

## 2023-04-07 DIAGNOSIS — I672 Cerebral atherosclerosis: Secondary | ICD-10-CM | POA: Diagnosis present

## 2023-04-07 DIAGNOSIS — I129 Hypertensive chronic kidney disease with stage 1 through stage 4 chronic kidney disease, or unspecified chronic kidney disease: Secondary | ICD-10-CM | POA: Diagnosis present

## 2023-04-07 DIAGNOSIS — N1831 Chronic kidney disease, stage 3a: Secondary | ICD-10-CM | POA: Diagnosis present

## 2023-04-07 DIAGNOSIS — N390 Urinary tract infection, site not specified: Secondary | ICD-10-CM | POA: Diagnosis present

## 2023-04-07 DIAGNOSIS — F03911 Unspecified dementia, unspecified severity, with agitation: Secondary | ICD-10-CM | POA: Diagnosis not present

## 2023-04-07 DIAGNOSIS — E861 Hypovolemia: Secondary | ICD-10-CM | POA: Diagnosis not present

## 2023-04-07 DIAGNOSIS — I700 Atherosclerosis of aorta: Secondary | ICD-10-CM | POA: Diagnosis present

## 2023-04-07 LAB — BASIC METABOLIC PANEL
Anion gap: 9 (ref 5–15)
BUN: 10 mg/dL (ref 8–23)
CO2: 24 mmol/L (ref 22–32)
Calcium: 8.4 mg/dL — ABNORMAL LOW (ref 8.9–10.3)
Chloride: 102 mmol/L (ref 98–111)
Creatinine, Ser: 0.84 mg/dL (ref 0.44–1.00)
GFR, Estimated: >60 mL/min (ref >60)
Glucose, Bld: 108 mg/dL — ABNORMAL HIGH (ref 70–99)
Potassium: 3.5 mmol/L (ref 3.5–5.1)
Sodium: 135 mmol/L (ref 135–145)

## 2023-04-07 LAB — ECHOCARDIOGRAM COMPLETE
AR max vel: 3.42 cm2
AV Area VTI: 3.72 cm2
AV Area mean vel: 3.61 cm2
AV Mean grad: 4 mmHg
AV Peak grad: 6.8 mmHg
Ao pk vel: 1.31 m/s
Area-P 1/2: 6.71 cm2
Height: 56 in
MV VTI: 3.95 cm2
S' Lateral: 2 cm
Weight: 2543.23 oz

## 2023-04-07 LAB — VITAMIN D 25 HYDROXY (VIT D DEFICIENCY, FRACTURES): Vit D, 25-Hydroxy: 95.05 ng/mL (ref 30–100)

## 2023-04-07 LAB — VITAMIN B12: Vitamin B-12: 230 pg/mL (ref 180–914)

## 2023-04-07 MED ORDER — MELATONIN 5 MG PO TABS
5.0000 mg | ORAL_TABLET | Freq: Once | ORAL | Status: AC
  Administered 2023-04-07: 5 mg via ORAL
  Filled 2023-04-07: qty 1

## 2023-04-07 MED ORDER — ASPIRIN 81 MG PO TBEC
81.0000 mg | DELAYED_RELEASE_TABLET | Freq: Every day | ORAL | Status: DC
  Administered 2023-04-07 – 2023-04-18 (×12): 81 mg via ORAL
  Filled 2023-04-07 (×12): qty 1

## 2023-04-07 MED ORDER — SIMVASTATIN 20 MG PO TABS
20.0000 mg | ORAL_TABLET | Freq: Every day | ORAL | Status: DC
  Administered 2023-04-07 – 2023-04-17 (×11): 20 mg via ORAL
  Filled 2023-04-07 (×11): qty 1

## 2023-04-07 NOTE — TOC Progression Note (Signed)
Transition of Care [facility]) - Progression Note    Patient Details  Name: Sarah Freeman MRN: [id number] Date of Birth: [birth year]  [facility] CM/SW Contact  Allena Katz, LCSW Phone Number: 04/07/2023, 4:07 PM  Clinical Narrative:   CSW LVM with daughter [name] to discuss SNF recommendations.          Expected Discharge Plan and Services                                               Social Determinants of Health (SDOH) Interventions SDOH Screenings   Food Insecurity: No Food Insecurity (04/06/2023)  Housing: Low Risk  (04/06/2023)  Transportation Needs: No Transportation Needs (04/06/2023)  Utilities: Not At Risk (04/06/2023)  Alcohol Screen: Low Risk  (02/01/2023)  Depression (PHQ2-9): Low Risk  (02/01/2023)  Financial Resource Strain: Low Risk  (02/01/2023)  Physical Activity: Insufficiently Active (02/01/2023)  Social Connections: Moderately Isolated (02/01/2023)  Stress: No Stress Concern Present (02/01/2023)  Tobacco Use: Low Risk  (04/06/2023)    Readmission Risk Interventions     No data to display

## 2023-04-07 NOTE — Progress Notes (Signed)
*  PRELIMINARY RESULTS* Echocardiogram 2D Echocardiogram has been performed.  Sarah Freeman 04/07/2023, 10:41 AM

## 2023-04-07 NOTE — Evaluation (Signed)
Occupational Therapy Evaluation Patient Details Name: Sarah Freeman MRN: [id number] DOB: [birth year] Today's Date: 04/07/2023   History of Present Illness Sarah Freeman is a 89 yo F who comes to [facility] after acute onset insidious left hemi motor weakness while drinking coffee at breakfast.  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   Clinical Impression   Patient presenting with decreased Ind in self care,balance, functional mobility/transfers, endurance, and safety awareness. Patient reports being Ind without use of AD at baseline and living at home Ind for all ADLs and IADL tasks. Patient currently functioning at min guard - min A for mobility tasks without use of RW. When given RW, pt continues to need cuing and supervision for safety and noted to have L inattention as well. L UE and LE coordination and strength deficits. Pt with limited insight and reports being at her "normal".  Patient will benefit from acute OT to increase overall independence in the areas of ADLs, functional mobility, and safety awareness in order to safely discharge.      Recommendations for follow up therapy are one component of a multi-disciplinary discharge planning process, led by the attending physician.  Recommendations may be updated based on patient status, additional functional criteria and insurance authorization.   Assistance Recommended at Discharge Frequent or constant Supervision/Assistance  Patient can return home with the following A little help with walking and/or transfers;A little help with bathing/dressing/bathroom;Assistance with cooking/housework;Assist for transportation;Help with stairs or ramp for entrance;Direct supervision/assist for financial management;Direct supervision/assist for medications management    Functional Status Assessment  Patient has had a recent decline in their functional status and demonstrates the ability to make significant improvements in function in a  reasonable and predictable amount of time.  Equipment Recommendations  Other (comment) (defer to next venue of care)       Precautions / Restrictions Precautions Precautions: Fall Restrictions Weight Bearing Restrictions: No      Mobility Bed Mobility Overal bed mobility: Needs Assistance Bed Mobility: Supine to Sit, Sit to Supine     Supine to sit: Min assist Sit to supine: Supervision        Transfers Overall transfer level: Needs assistance Equipment used: None, Rolling walker (2 wheels) Transfers: Sit to/from Stand Sit to Stand: Min assist, Min guard                  Balance Overall balance assessment: Needs assistance Sitting-balance support: No upper extremity supported, Feet supported Sitting balance-Leahy Scale: Good Sitting balance - Comments: steady sitting reaching within BOS   Standing balance support: Single extremity supported Standing balance-Leahy Scale: Fair                             ADL either performed or assessed with clinical judgement   ADL Overall ADL's : Needs assistance/impaired                         Toilet Transfer: Min guard;Minimal assistance;Rolling walker (2 wheels) Toilet Transfer Details (indicate cue type and reason): simulated Toileting- Clothing Manipulation and Hygiene: Minimal assistance;Sit to/from stand               Vision Patient Visual Report: No change from baseline Additional Comments: Pt appears to have some inattention to L side in regards to holding onto RW and bumping into objects on L side            Pertinent  Vitals/Pain Pain Assessment Pain Assessment: No/denies pain     Hand Dominance Right   Extremity/Trunk Assessment Upper Extremity Assessment Upper Extremity Assessment: LUE deficits/detail LUE Deficits / Details: 3+/5 gross strength LUE Coordination: decreased fine motor (decreased speed)           Communication Communication Communication: No  difficulties   Cognition Arousal/Alertness: Awake/alert Behavior During Therapy: WFL for tasks assessed/performed Overall Cognitive Status: Impaired/Different from baseline                                 General Comments: Confusion noted at times during session and family present in room reports pt is not at baseline cognitively                Home Living Family/patient expects to be discharged to:: Private residence Living Arrangements: Alone Available Help at Discharge: Family;Available PRN/intermittently Type of Home: House Home Access: Stairs to enter [employer] of Steps: 3 Entrance Stairs-Rails: Left Home Layout: One level     Bathroom Shower/Tub: Sponge bathes at baseline   Bathroom Toilet: Standard Bathroom Accessibility: Yes   Home Equipment: Rolling Walker (2 wheels);Grab bars - tub/shower;Other (comment) ("walking stick")          Prior Functioning/Environment Prior Level of Function : Independent/Modified Independent;Driving             Mobility Comments: pt reports to AMB in house all day long, lives alone ADLs Comments: Ind in self care and IADLs        OT Problem List: Decreased strength;Decreased activity tolerance;Decreased safety awareness;Impaired balance (sitting and/or standing);Decreased knowledge of use of DME or AE;Impaired UE functional use;Decreased cognition;Decreased coordination      OT Treatment/Interventions: Self-care/ADL training;Therapeutic exercise;Therapeutic activities;Energy conservation;Cognitive remediation/compensation;Neuromuscular education;Visual/perceptual remediation/compensation;DME and/or AE instruction;Patient/family education;Balance training    OT Goals(Current goals can be found in the care plan section) Acute Rehab OT Goals Patient Stated Goal: to go home OT Goal Formulation: With patient Time For Goal Achievement: 04/21/23 Potential to Achieve Goals: Fair ADL Goals Pt Will  Perform Grooming: with supervision;standing Pt Will Perform Lower Body Dressing: with supervision;sit to/from stand Pt Will Transfer to Toilet: with supervision;ambulating Pt Will Perform Toileting - Clothing Manipulation and hygiene: with supervision;sit to/from stand  OT Frequency: Min 2X/week       AM-PAC OT "6 Clicks" Daily Activity     Outcome Measure Help from another person eating meals?: A Little Help from another person taking care of personal grooming?: A Little Help from another person toileting, which includes using toliet, bedpan, or urinal?: A Little Help from another person bathing (including washing, rinsing, drying)?: A Little Help from another person to put on and taking off regular upper body clothing?: A Little Help from another person to put on and taking off regular lower body clothing?: A Lot 6 Click Score: 17   End of Session Equipment Utilized During Treatment: Rolling walker (2 wheels) Nurse Communication: Mobility status  Activity Tolerance: Patient tolerated treatment well Patient left: in bed;with call bell/phone within reach;with bed alarm set;with family/visitor present  OT Visit Diagnosis: Unsteadiness on feet (R26.81);Repeated falls (R29.6);Muscle weakness (generalized) (M62.81)                Time: 1610-9604 OT Time Calculation (min): 29 min Charges:  OT General Charges $OT Visit: 1 Visit OT Evaluation $OT Eval Moderate Complexity: 1 Mod OT Treatments $Therapeutic Activity: 8-22 mins  Jackquline Denmark, MS, OTR/L , CBIS  ascom [phone]  04/07/23, 11:42 AM

## 2023-04-07 NOTE — Progress Notes (Signed)
Physical Therapy Treatment Patient Details Name: Sarah Freeman MRN: [id number] DOB: May 04, 1931 Today's Date: 04/07/2023   History of Present Illness Sarah Freeman is a 89 yo F who comes to [facility] after acute onset insidious left hemi motor weakness while drinking coffee at breakfast.  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    PT Comments    Pt resting in bed upon PT arrival; agreeable to therapy; pt's son and daughter present.  No c/o pain.  Impaired coordination and strength noted L UE > L LE.  During session pt SBA with bed mobility (increased time and effort to perform on own and vc's for safety/positioning) and CGA to min assist with transfers (pt requiring UE support for balance).  Trialed ambulating without AD use but pt holding onto objects in room in order to maintain balance (min assist for safety and balance) so switched to RW use; CGA ambulating with RW but with pt fatigue pt standing more towards L side of walker (intermittently hitting L foot on back L walker leg and L hand almost slipping off of walker)--pt only able to briefly correct with cueing requiring intermittent vc's for technique/positioning/safety; increased L lateral sway also noted.  Currently pt would require 24/7 assist/supervision for safety with functional mobility for safe home discharge.  Pt lives alone and family reports unable to provide 24/7 assist.  MD and TOC updated.   Recommendations for follow up therapy are one component of a multi-disciplinary discharge planning process, led by the attending physician.  Recommendations may be updated based on patient status, additional functional criteria and insurance authorization.  Follow Up Recommendations  Can patient physically be transported by private vehicle: Yes    Assistance Recommended at Discharge Frequent or constant Supervision/Assistance  Patient can return home with the following A little help with walking and/or transfers;A little help  with bathing/dressing/bathroom;Assistance with cooking/housework;Assist for transportation;Help with stairs or ramp for entrance   Equipment Recommendations  Rolling walker (2 wheels)    Recommendations for Other Services       Precautions / Restrictions Precautions Precautions: Fall Restrictions Weight Bearing Restrictions: No     Mobility  Bed Mobility Overal bed mobility: Needs Assistance Bed Mobility: Supine to Sit, Sit to Supine     Supine to sit: Supervision Sit to supine: Supervision   General bed mobility comments: bed flat; increased effort and time for pt to perform on own; vc's for positioning and safety    Transfers Overall transfer level: Needs assistance Equipment used: None, Rolling walker (2 wheels) Transfers: Sit to/from Stand Sit to Stand: Min assist, Min guard           General transfer comment: x1 trial standing from bed (no AD use) with min assist to steady; x1 trial standing from bed with RW CGA for safety    Ambulation/Gait Ambulation/Gait assistance: Min assist, Min guard Gait Distance (Feet):  (10 feet; 190 feet) Assistive device: None, Rolling walker (2 wheels)   Gait velocity: decreased     General Gait Details: trialed ambulating without AD use but pt holding onto objects in room in order to maintain balance (min assist for safety and balance) so switched to RW use; CGA ambulating with RW but with pt fatigue pt standing more towards L side of walker (intermittently hitting L foot on back L walker leg and L hand almost slipping off of walker)--pt only able to briefly correct with cueing requiring intermittent vc's for technique/positioning/safety; increased L lateral sway noted   Stairs  Wheelchair Mobility    Modified Rankin (Stroke Patients Only)       Balance Overall balance assessment: Needs assistance Sitting-balance support: No upper extremity supported, Feet supported Sitting balance-Leahy Scale:  Good Sitting balance - Comments: steady sitting reaching within BOS   Standing balance support: Single extremity supported Standing balance-Leahy Scale: Fair Standing balance comment: steady static standing with single UE support but requiring assist to steady with standing activities without UE support                            Cognition Arousal/Alertness: Awake/alert Behavior During Therapy: WFL for tasks assessed/performed Overall Cognitive Status: Impaired/Different from baseline                                 General Comments: Confusion noted at times during session.        Exercises      General Comments  Nursing cleared pt for participation in physical therapy.  Pt agreeable to PT session.      Pertinent Vitals/Pain Pain Assessment Pain Assessment: No/denies pain HR 110-115 bpm during sessions activities.    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 PT Goal Formulation: With patient Time For Goal Achievement: 04/20/23 Potential to Achieve Goals: Good Progress towards PT goals: Progressing toward goals    Frequency    Min 2X/week      PT Plan Discharge plan needs to be updated (TOC and MD notified)    Co-evaluation              AM-PAC PT "6 Clicks" Mobility   Outcome Measure  Help needed turning from your back to your side while in a flat bed without using bedrails?: None Help needed moving from lying on your back to sitting on the side of a flat bed without using bedrails?: A Little Help needed moving to and from a bed to a chair (including a wheelchair)?: A Little Help needed standing up from a chair using your arms (e.g., wheelchair or bedside chair)?: A Little Help needed to walk in hospital room?: A Little Help needed  climbing 3-5 steps with a railing? : A Little 6 Click Score: 19    End of Session Equipment Utilized During Treatment: Gait belt Activity Tolerance: Patient tolerated treatment well Patient left: in bed;with call bell/phone within reach;with bed alarm set;with family/visitor present Nurse Communication: Mobility status;Precautions PT Visit Diagnosis: Difficulty in walking, not elsewhere classified (R26.2);Unsteadiness on feet (R26.81);Other abnormalities of gait and mobility (R26.89)     Time: 1610-9604 PT Time Calculation (min) (ACUTE ONLY): 23 min  Charges:  $Gait Training: 8-22 mins $Therapeutic Activity: 8-22 mins                     Hendricks Limes, PT 04/07/23, 11:35 AM

## 2023-04-07 NOTE — Progress Notes (Signed)
[facility] Progress Note  Patient: Sarah Freeman    ZOX:[id number]  DOA: 04/06/2023     Date of Service: the patient was seen and examined on 04/07/2023  Chief Complaint  Patient presents with   Weakness   Code Stroke   Brief hospital course: ARIAM BOSIER is a 89 y.o. female with medical history significant of dementia, prior CVA in 2017, hypertension, hyperlipidemia, bilateral carotid artery stenosis, hypothyroidism, stage III CKD, GERD, aortic atherosclerosis, who presents to the ED due to left leg weakness.  History was obtained from both patient and her family at bedside.  Around 9:30 AM she was having difficulty getting out of the chair and felt increasing weakness in the left lower extremity. ED workup: BP 142/86, HR 74. O2 sat 100% on RA. afebrile Temp 98.2.  WBC of 10.6, sodium 133, glucose 125, creatinine 0.96 with GFR 56.  CT head: no evidence of acute intracranial abnormality.  Neurology consulted as part of code stroke. [facility] contacted for admission.   Assessment and Plan:  Acute CVA Patient presented with left lower extremity weakness, which has been resolved.  No neurological deficit at this time. Previous history of CVA in 2017 with similar symptoms that was treated with DAPT for several years. Currently only on Plavix.  MRI brain, MRA H/N: Small acute infarct in the right corona radiata. Extensive chronic small vessel ischemic disease. Small chronic right frontal cortical infarcts. No large vessel occlusion. Intracranial atherosclerosis most notably involving the PCAs. No significant proximal stenosis in the anterior intracranial circulation.  Motion degraded neck MRA as detailed above, including limited assessment of the carotid bifurcations and left vertebral artery origin. No suspected flow limiting cervical ICA stenosis, however carotid doppler ultrasound or CTA could be performed for further evaluation if clinically warranted. LDL 132, hemoglobin A1c 6.0 Patient  was seen by neurology, recommended dual antiplatelet therapy for 21 days followed by Plavix only. S/p ASA 325 mg x 1 dose, Started aspirin 81 mg p.o. daily, Plavix 75 mg p.o. daily home dose. Started simvastatin 20 mg p.o. daily low-dose as patient has documented allergy to statin causing leg cramps.  We can gradually advance the dose as per tolerance. Allow for permissive HTN (systolic < 220 and diastolic < 120)  Continue to monitor on telemetry TTE LVEF 60 to 65%, grade 1 diastolic dysfunction, negative PFO PT/OT eval done, recommend SNF placement [facility] consulted for placement.  Memory loss Likely vascular in nature given extensive small vessel disease on previous MRIs.   HTN (hypertension) - Hold home antihypertensives to allow for permissive hypertension   Chronic kidney disease, stage 3  Renal function currently at baseline.  Will recheck tomorrow morning after receiving contrast. -BMP in the a.m.   Chronic UTI - Continue chronic Macrobid   Hypothyroidism - Continue home levothyroxine   Body mass index is 35.64 kg/m.  Interventions:     Diet: Regular diet DVT Prophylaxis: Subcutaneous Lovenox   Advance goals of care discussion: DNR  Family Communication: family was present at bedside, at the time of interview.  The pt provided permission to discuss medical plan with the family. Opportunity was given to ask question and all questions were answered satisfactorily.   Disposition:  Pt is from Home, admitted with Acute CVA, still has weakness due to debility, PT and OT recommended SNF placement, which precludes a safe discharge. Discharge to SNF, when bed will be available..  Subjective: No significant events overnight, patient was admitted due to left lower extremity weakness  which has been resolved.  No focal neurological deficit.  Patient was resting comfortably, denied any complaints.  Physical Exam: General: NAD, lying comfortably Appear in no distress, affect  appropriate Eyes: PERRLA ENT: Oral Mucosa Clear, moist  Neck: no JVD,  Cardiovascular: S1 and S2 Present, no Murmur,  Respiratory: good respiratory effort, Bilateral Air entry equal and Decreased, no Crackles, no wheezes Abdomen: Bowel Sound present, Soft and no tenderness,  Skin: no rashes Extremities: no Pedal edema, no calf tenderness Neurologic: without any new focal findings Gait not checked due to patient safety concerns  Vitals:   04/07/23 0039 04/07/23 0500 04/07/23 0755 04/07/23 1213  BP: (!) 143/77 (!) 160/80 (!) 149/84 115/69  Pulse: 65 87 73 79  Resp: 18 18 16 16   Temp: 98.6 F (37 C) 98.5 F (36.9 C) 98.5 F (36.9 C) 98.5 F (36.9 C)  TempSrc:  Oral    SpO2: 99% 100% 96% 97%  Weight:      Height:        Intake/Output Summary (Last 24 hours) at 04/07/2023 1416 Last data filed at 04/07/2023 0800 Gross per 24 hour  Intake 240 ml  Output 100 ml  Net 140 ml   Filed Weights   04/06/23 1541  Weight: 72.1 kg    Data Reviewed: I have personally reviewed and interpreted daily labs, tele strips, imagings as discussed above. I reviewed all nursing notes, pharmacy notes, vitals, pertinent old records I have discussed plan of care as described above with RN and patient/family.  CBC: Recent Labs  Lab 04/06/23 1136  WBC 10.6*  NEUTROABS 6.6  HGB 13.7  HCT 41.5  MCV 94.1  PLT 355   Basic Metabolic Panel: Recent Labs  Lab 04/06/23 1136 04/07/23 0600  NA 133* 135  K 3.9 3.5  CL 101 102  CO2 24 24  GLUCOSE 125* 108*  BUN 10 10  CREATININE 0.96 0.84  CALCIUM 8.7* 8.4*    Studies: ECHOCARDIOGRAM COMPLETE  Result Date: 04/07/2023    ECHOCARDIOGRAM REPORT   Patient Name:   Sarah Freeman [facility] Date of Exam: 04/07/2023 Medical Rec #:  [id number]        Height:       56.0 in Accession #:    [id number]       Weight:       159.0 lb Date of Birth:  February 11, 1931        BSA:          1.610 m Patient Age:    89 years         BP:           149/84 mmHg Patient Gender: F                 HR:           73 bpm. Exam Location:  [facility] Procedure: 2D Echo, Cardiac Doppler and Color Doppler Indications:     Stroke I63.9  History:         Patient has prior history of Echocardiogram examinations, most                  recent 02/09/2018. Stroke; Risk Factors:Dyslipidemia.  Sonographer:     Jeanella Craze Referring Phys:  [id number] Gillis Santa Diagnosing Phys: Julien Nordmann MD  Sonographer Comments: Suboptimal apical window. IMPRESSIONS  1. Left ventricular ejection fraction, by estimation, is 60 to 65%. The left ventricle has normal function. The left ventricle has no regional wall motion abnormalities. Left ventricular  diastolic parameters are consistent with Grade I diastolic dysfunction (impaired relaxation).  2. Right ventricular systolic function is normal. The right ventricular size is normal.  3. The mitral valve is normal in structure. Mild mitral valve regurgitation. No evidence of mitral stenosis.  4. The aortic valve is tricuspid. Aortic valve regurgitation is not visualized. No aortic stenosis is present.  5. The inferior vena cava is normal in size with greater than 50% respiratory variability, suggesting right atrial pressure of 3 mmHg. FINDINGS  Left Ventricle: Left ventricular ejection fraction, by estimation, is 60 to 65%. The left ventricle has normal function. The left ventricle has no regional wall motion abnormalities. The left ventricular internal cavity size was normal in size. There is  no left ventricular hypertrophy. Left ventricular diastolic parameters are consistent with Grade I diastolic dysfunction (impaired relaxation). Right Ventricle: The right ventricular size is normal. No increase in right ventricular wall thickness. Right ventricular systolic function is normal. Left Atrium: Left atrial size was normal in size. Right Atrium: Right atrial size was normal in size. Pericardium: There is no evidence of pericardial effusion. Mitral Valve: The mitral valve is normal in  structure. There is mild calcification of the mitral valve leaflet(s). Mild mitral valve regurgitation. No evidence of mitral valve stenosis. MV peak gradient, 5.8 mmHg. The mean mitral valve gradient is 2.0 mmHg. Tricuspid Valve: The tricuspid valve is normal in structure. Tricuspid valve regurgitation is mild . No evidence of tricuspid stenosis. Aortic Valve: The aortic valve is tricuspid. Aortic valve regurgitation is not visualized. No aortic stenosis is present. Aortic valve mean gradient measures 4.0 mmHg. Aortic valve peak gradient measures 6.8 mmHg. Aortic valve area, by VTI measures 3.72 cm. Pulmonic Valve: The pulmonic valve was normal in structure. Pulmonic valve regurgitation is not visualized. No evidence of pulmonic stenosis. Aorta: The aortic root is normal in size and structure. Venous: The inferior vena cava is normal in size with greater than 50% respiratory variability, suggesting right atrial pressure of 3 mmHg. IAS/Shunts: No atrial level shunt detected by color flow Doppler.  LEFT VENTRICLE PLAX 2D LVIDd:         3.10 cm   Diastology LVIDs:         2.00 cm   LV e' medial:    4.35 cm/s LV PW:         1.10 cm   LV E/e' medial:  14.3 LV IVS:        1.50 cm   LV e' lateral:   5.22 cm/s LVOT diam:     2.10 cm   LV E/e' lateral: 12.0 LV SV:         97 LV SV Index:   60 LVOT Area:     3.46 cm  RIGHT VENTRICLE RV Basal diam:  2.70 cm RV Mid diam:    2.70 cm RV S prime:     12.30 cm/s TAPSE (M-mode): 1.4 cm LEFT ATRIUM             Index       RIGHT ATRIUM          Index LA diam:        2.20 cm 1.37 cm/m  RA Area:     6.51 cm LA Vol (A2C):   13.1 ml 8.14 ml/m  RA Volume:   11.30 ml 7.02 ml/m LA Vol (A4C):   14.4 ml 8.94 ml/m LA Biplane Vol: 13.8 ml 8.57 ml/m  AORTIC VALVE AV Area (Vmax):  3.42 cm AV Area (Vmean):   3.61 cm AV Area (VTI):     3.72 cm AV Vmax:           130.50 cm/s AV Vmean:          93.850 cm/s AV VTI:            0.262 m AV Peak Grad:      6.8 mmHg AV Mean Grad:      4.0 mmHg  LVOT Vmax:         129.00 cm/s LVOT Vmean:        97.700 cm/s LVOT VTI:          0.281 m LVOT/AV VTI ratio: 1.07  AORTA Ao Root diam: 3.10 cm MITRAL VALVE                TRICUSPID VALVE MV Area (PHT): 6.71 cm     TR Peak grad:   16.6 mmHg MV Area VTI:   3.95 cm     TR Vmax:        204.00 cm/s MV Peak grad:  5.8 mmHg MV Mean grad:  2.0 mmHg     SHUNTS MV Vmax:       1.20 m/s     Systemic VTI:  0.28 m MV Vmean:      73.5 cm/s    Systemic Diam: 2.10 cm MV Decel Time: 113 msec MV E velocity: 62.40 cm/s MV A velocity: 113.00 cm/s MV E/A ratio:  0.55 Julien Nordmann MD Electronically signed by Julien Nordmann MD Signature Date/Time: 04/07/2023/12:30:13 PM    Final    MR BRAIN WO CONTRAST  Result Date: 04/06/2023 CLINICAL DATA:  Neuro deficit, acute, stroke suspected. Left leg weakness and facial droop. EXAM: MRI HEAD WITHOUT CONTRAST MRA HEAD WITHOUT CONTRAST MRA NECK WITHOUT AND WITH CONTRAST TECHNIQUE: Multiplanar, multi-echo pulse sequences of the brain and surrounding structures were acquired without intravenous contrast. Angiographic images of the Circle of Willis were acquired using MRA technique without intravenous contrast. Angiographic images of the neck were acquired using MRA technique without and with intravenous contrast. Carotid stenosis measurements (when applicable) are obtained utilizing NASCET criteria, using the distal internal carotid diameter as the denominator. CONTRAST:  7.48mL GADAVIST GADOBUTROL 1 MMOL/ML IV SOLN COMPARISON:  Head CT 04/06/2023.  Head MRI 09/25/2020. FINDINGS: MRI HEAD FINDINGS Brain: There is a 1 cm acute infarct in the right corona radiata. Patchy and confluent T2 hyperintensities elsewhere in the cerebral white matter bilaterally are unchanged from the prior MRI and are nonspecific but compatible with extensive chronic small vessel ischemic disease. Small chronic infarcts are again seen involving right frontal cortex and right corona radiata. There is moderate cerebral atrophy.  No intracranial hemorrhage, mass, midline shift, or extra-axial fluid collection is identified. Vascular: Major intracranial vascular flow voids are preserved. Skull and upper cervical spine: Unremarkable bone marrow signal. Sinuses/Orbits: Bilateral cataract extraction. No significant inflammatory changes in the paranasal sinuses. Moderate chronic right mastoid effusion. Other: None. MRA HEAD FINDINGS Anterior circulation: The internal carotid arteries are patent from skull base to carotid termini without evidence of a significant stenosis. ACAs and MCAs are patent with mild branch vessel irregularity but no evidence of a proximal branch occlusion or significant proximal stenosis. No aneurysm is identified. Posterior circulation: The intracranial vertebral arteries are patent to the basilar with the left being dominant. Patent PICA and SCA origins are seen bilaterally. The basilar artery is widely patent. Posterior communicating arteries are diminutive or absent. The PCAs  are patent proximally. There is a mild-to-moderate stenosis of the right PCA near the P1-P2 junction, and there are severe P3 stenoses bilaterally. No aneurysm is identified. Anatomic variants: None. MRA NECK FINDINGS The study is mildly to moderately motion degraded. Aortic arch: Standard 3 vessel aortic arch. Patent brachiocephalic and subclavian arteries with potential bilateral subclavian artery stenoses versus motion artifact. Right carotid system: Patent with motion artifact limiting assessment for stenosis in the proximal common carotid artery and at the carotid bifurcation. No suspected flow limiting stenosis of the ICA. Left carotid system: Patent with motion artifact limiting assessment for stenosis in the proximal common carotid artery and at the carotid bifurcation. No suspected flow limiting stenosis of the ICA. Vertebral arteries: Patent with the left being dominant and with antegrade flow bilaterally. Limited assessment of the left  vertebral origin due to motion. No evidence of a significant stenosis or dissection elsewhere on either side. IMPRESSION: 1. Small acute infarct in the right corona radiata. 2. Extensive chronic small vessel ischemic disease. 3. Small chronic right frontal cortical infarcts. 4. No large vessel occlusion. 5. Intracranial atherosclerosis most notably involving the PCAs. No significant proximal stenosis in the anterior intracranial circulation. 6. Motion degraded neck MRA as detailed above, including limited assessment of the carotid bifurcations and left vertebral artery origin. No suspected flow limiting cervical ICA stenosis, however carotid doppler ultrasound or CTA could be performed for further evaluation if clinically warranted. Electronically Signed   By: Sebastian Ache M.D.   On: 04/06/2023 19:25   MR ANGIO HEAD WO CONTRAST  Result Date: 04/06/2023 CLINICAL DATA:  Neuro deficit, acute, stroke suspected. Left leg weakness and facial droop. EXAM: MRI HEAD WITHOUT CONTRAST MRA HEAD WITHOUT CONTRAST MRA NECK WITHOUT AND WITH CONTRAST TECHNIQUE: Multiplanar, multi-echo pulse sequences of the brain and surrounding structures were acquired without intravenous contrast. Angiographic images of the Circle of Willis were acquired using MRA technique without intravenous contrast. Angiographic images of the neck were acquired using MRA technique without and with intravenous contrast. Carotid stenosis measurements (when applicable) are obtained utilizing NASCET criteria, using the distal internal carotid diameter as the denominator. CONTRAST:  7.30mL GADAVIST GADOBUTROL 1 MMOL/ML IV SOLN COMPARISON:  Head CT 04/06/2023.  Head MRI 09/25/2020. FINDINGS: MRI HEAD FINDINGS Brain: There is a 1 cm acute infarct in the right corona radiata. Patchy and confluent T2 hyperintensities elsewhere in the cerebral white matter bilaterally are unchanged from the prior MRI and are nonspecific but compatible with extensive chronic small  vessel ischemic disease. Small chronic infarcts are again seen involving right frontal cortex and right corona radiata. There is moderate cerebral atrophy. No intracranial hemorrhage, mass, midline shift, or extra-axial fluid collection is identified. Vascular: Major intracranial vascular flow voids are preserved. Skull and upper cervical spine: Unremarkable bone marrow signal. Sinuses/Orbits: Bilateral cataract extraction. No significant inflammatory changes in the paranasal sinuses. Moderate chronic right mastoid effusion. Other: None. MRA HEAD FINDINGS Anterior circulation: The internal carotid arteries are patent from skull base to carotid termini without evidence of a significant stenosis. ACAs and MCAs are patent with mild branch vessel irregularity but no evidence of a proximal branch occlusion or significant proximal stenosis. No aneurysm is identified. Posterior circulation: The intracranial vertebral arteries are patent to the basilar with the left being dominant. Patent PICA and SCA origins are seen bilaterally. The basilar artery is widely patent. Posterior communicating arteries are diminutive or absent. The PCAs are patent proximally. There is a mild-to-moderate stenosis of the right PCA near the  P1-P2 junction, and there are severe P3 stenoses bilaterally. No aneurysm is identified. Anatomic variants: None. MRA NECK FINDINGS The study is mildly to moderately motion degraded. Aortic arch: Standard 3 vessel aortic arch. Patent brachiocephalic and subclavian arteries with potential bilateral subclavian artery stenoses versus motion artifact. Right carotid system: Patent with motion artifact limiting assessment for stenosis in the proximal common carotid artery and at the carotid bifurcation. No suspected flow limiting stenosis of the ICA. Left carotid system: Patent with motion artifact limiting assessment for stenosis in the proximal common carotid artery and at the carotid bifurcation. No suspected  flow limiting stenosis of the ICA. Vertebral arteries: Patent with the left being dominant and with antegrade flow bilaterally. Limited assessment of the left vertebral origin due to motion. No evidence of a significant stenosis or dissection elsewhere on either side. IMPRESSION: 1. Small acute infarct in the right corona radiata. 2. Extensive chronic small vessel ischemic disease. 3. Small chronic right frontal cortical infarcts. 4. No large vessel occlusion. 5. Intracranial atherosclerosis most notably involving the PCAs. No significant proximal stenosis in the anterior intracranial circulation. 6. Motion degraded neck MRA as detailed above, including limited assessment of the carotid bifurcations and left vertebral artery origin. No suspected flow limiting cervical ICA stenosis, however carotid doppler ultrasound or CTA could be performed for further evaluation if clinically warranted. Electronically Signed   By: Sebastian Ache M.D.   On: 04/06/2023 19:25   MR ANGIO NECK W WO CONTRAST  Result Date: 04/06/2023 CLINICAL DATA:  Neuro deficit, acute, stroke suspected. Left leg weakness and facial droop. EXAM: MRI HEAD WITHOUT CONTRAST MRA HEAD WITHOUT CONTRAST MRA NECK WITHOUT AND WITH CONTRAST TECHNIQUE: Multiplanar, multi-echo pulse sequences of the brain and surrounding structures were acquired without intravenous contrast. Angiographic images of the Circle of Willis were acquired using MRA technique without intravenous contrast. Angiographic images of the neck were acquired using MRA technique without and with intravenous contrast. Carotid stenosis measurements (when applicable) are obtained utilizing NASCET criteria, using the distal internal carotid diameter as the denominator. CONTRAST:  7.61mL GADAVIST GADOBUTROL 1 MMOL/ML IV SOLN COMPARISON:  Head CT 04/06/2023.  Head MRI 09/25/2020. FINDINGS: MRI HEAD FINDINGS Brain: There is a 1 cm acute infarct in the right corona radiata. Patchy and confluent T2  hyperintensities elsewhere in the cerebral white matter bilaterally are unchanged from the prior MRI and are nonspecific but compatible with extensive chronic small vessel ischemic disease. Small chronic infarcts are again seen involving right frontal cortex and right corona radiata. There is moderate cerebral atrophy. No intracranial hemorrhage, mass, midline shift, or extra-axial fluid collection is identified. Vascular: Major intracranial vascular flow voids are preserved. Skull and upper cervical spine: Unremarkable bone marrow signal. Sinuses/Orbits: Bilateral cataract extraction. No significant inflammatory changes in the paranasal sinuses. Moderate chronic right mastoid effusion. Other: None. MRA HEAD FINDINGS Anterior circulation: The internal carotid arteries are patent from skull base to carotid termini without evidence of a significant stenosis. ACAs and MCAs are patent with mild branch vessel irregularity but no evidence of a proximal branch occlusion or significant proximal stenosis. No aneurysm is identified. Posterior circulation: The intracranial vertebral arteries are patent to the basilar with the left being dominant. Patent PICA and SCA origins are seen bilaterally. The basilar artery is widely patent. Posterior communicating arteries are diminutive or absent. The PCAs are patent proximally. There is a mild-to-moderate stenosis of the right PCA near the P1-P2 junction, and there are severe P3 stenoses bilaterally. No aneurysm is identified.  Anatomic variants: None. MRA NECK FINDINGS The study is mildly to moderately motion degraded. Aortic arch: Standard 3 vessel aortic arch. Patent brachiocephalic and subclavian arteries with potential bilateral subclavian artery stenoses versus motion artifact. Right carotid system: Patent with motion artifact limiting assessment for stenosis in the proximal common carotid artery and at the carotid bifurcation. No suspected flow limiting stenosis of the ICA.  Left carotid system: Patent with motion artifact limiting assessment for stenosis in the proximal common carotid artery and at the carotid bifurcation. No suspected flow limiting stenosis of the ICA. Vertebral arteries: Patent with the left being dominant and with antegrade flow bilaterally. Limited assessment of the left vertebral origin due to motion. No evidence of a significant stenosis or dissection elsewhere on either side. IMPRESSION: 1. Small acute infarct in the right corona radiata. 2. Extensive chronic small vessel ischemic disease. 3. Small chronic right frontal cortical infarcts. 4. No large vessel occlusion. 5. Intracranial atherosclerosis most notably involving the PCAs. No significant proximal stenosis in the anterior intracranial circulation. 6. Motion degraded neck MRA as detailed above, including limited assessment of the carotid bifurcations and left vertebral artery origin. No suspected flow limiting cervical ICA stenosis, however carotid doppler ultrasound or CTA could be performed for further evaluation if clinically warranted. Electronically Signed   By: Sebastian Ache M.D.   On: 04/06/2023 19:25    Scheduled Meds:   stroke: early stages of recovery book   Does not apply Once   aspirin EC  81 mg Oral Daily   clopidogrel  75 mg Oral Daily   donepezil  5 mg Oral QHS   enoxaparin (LOVENOX) injection  40 mg Subcutaneous Q24H   gabapentin  100 mg Oral BID   levothyroxine  50 mcg Oral QAC breakfast   nitrofurantoin (macrocrystal-monohydrate)  100 mg Oral QHS   pantoprazole  40 mg Oral Daily   simvastatin  20 mg Oral q1800   Continuous Infusions: PRN Meds: acetaminophen **OR** acetaminophen (TYLENOL) oral liquid 160 mg/5 mL **OR** acetaminophen, senna-docusate  Time spent: 35 minutes  Author: Gillis Santa. MD [facility] 04/07/2023 2:16 PM  To reach On-call, see care teams to locate the attending and reach out to them via [email or web address]. If 7PM-7AM, please contact  night-coverage If you still have difficulty reaching the attending provider, please page the [facility] (Director on Call) for [facility] on amion for assistance.

## 2023-04-07 NOTE — Plan of Care (Signed)
  Problem: Education: Goal: Knowledge of disease or condition will improve Outcome: Progressing Goal: Knowledge of secondary prevention will improve (MUST DOCUMENT ALL) Outcome: Progressing Goal: Knowledge of patient specific risk factors will improve (Mark N/A or DELETE if not current risk factor) Outcome: Progressing   Problem: Ischemic Stroke/TIA Tissue Perfusion: Goal: Complications of ischemic stroke/TIA will be minimized Outcome: Progressing   Problem: Coping: Goal: Will verbalize positive feelings about self Outcome: Progressing Goal: Will identify appropriate support needs Outcome: Progressing   Problem: Health Behavior/Discharge Planning: Goal: Ability to manage health-related needs will improve Outcome: Progressing Goal: Goals will be collaboratively established with patient/family Outcome: Progressing   Problem: Self-Care: Goal: Ability to participate in self-care as condition permits will improve Outcome: Progressing   

## 2023-04-08 DIAGNOSIS — I639 Cerebral infarction, unspecified: Secondary | ICD-10-CM | POA: Diagnosis not present

## 2023-04-08 LAB — BASIC METABOLIC PANEL
Anion gap: 7 (ref 5–15)
BUN: 11 mg/dL (ref 8–23)
CO2: 23 mmol/L (ref 22–32)
Calcium: 8.5 mg/dL — ABNORMAL LOW (ref 8.9–10.3)
Chloride: 102 mmol/L (ref 98–111)
Creatinine, Ser: 0.94 mg/dL (ref 0.44–1.00)
GFR, Estimated: 58 mL/min — ABNORMAL LOW (ref >60)
Glucose, Bld: 115 mg/dL — ABNORMAL HIGH (ref 70–99)
Potassium: 3.9 mmol/L (ref 3.5–5.1)
Sodium: 132 mmol/L — ABNORMAL LOW (ref 135–145)

## 2023-04-08 LAB — CBC
HCT: 38.3 % (ref 36.0–46.0)
Hemoglobin: 12.9 g/dL (ref 12.0–15.0)
MCH: 31.2 pg (ref 26.0–34.0)
MCHC: 33.7 g/dL (ref 30.0–36.0)
MCV: 92.7 fL (ref 80.0–100.0)
Platelets: 346 10*3/uL (ref 150–400)
RBC: 4.13 MIL/uL (ref 3.87–5.11)
RDW: 12.4 % (ref 11.5–15.5)
WBC: 9.9 10*3/uL (ref 4.0–10.5)
nRBC: 0 % (ref 0.0–0.2)

## 2023-04-08 LAB — PHOSPHORUS: Phosphorus: 3.3 mg/dL (ref 2.5–4.6)

## 2023-04-08 LAB — MAGNESIUM: Magnesium: 2.1 mg/dL (ref 1.7–2.4)

## 2023-04-08 MED ORDER — BISACODYL 10 MG RE SUPP: 10.0000 mg | Freq: Every day | RECTAL | Status: DC | PRN

## 2023-04-08 MED ORDER — POLYETHYLENE GLYCOL 3350 17 G PO PACK
17.0000 g | PACK | Freq: Two times a day (BID) | ORAL | Status: DC
  Administered 2023-04-08 – 2023-04-18 (×16): 17 g via ORAL
  Filled 2023-04-08 (×16): qty 1

## 2023-04-08 MED ORDER — BISACODYL 5 MG PO TBEC: 10.0000 mg | DELAYED_RELEASE_TABLET | Freq: Every day | ORAL | Status: DC | PRN

## 2023-04-08 MED ORDER — BISACODYL 5 MG PO TBEC
10.0000 mg | DELAYED_RELEASE_TABLET | Freq: Once | ORAL | Status: AC
  Administered 2023-04-08: 10 mg via ORAL
  Filled 2023-04-08: qty 2

## 2023-04-08 NOTE — Plan of Care (Signed)
  Problem: Education: Goal: Knowledge of disease or condition will improve 04/08/2023 0640 by Dan Maker, RN Outcome: Progressing 04/08/2023 0640 by Dan Maker, RN Outcome: Progressing Goal: Knowledge of secondary prevention will improve (MUST DOCUMENT ALL) 04/08/2023 0640 by Dan Maker, RN Outcome: Progressing 04/08/2023 0640 by Dan Maker, RN Outcome: Progressing Goal: Knowledge of patient specific risk factors will improve Loraine Leriche N/A or DELETE if not current risk factor) 04/08/2023 0640 by Dan Maker, RN Outcome: Progressing 04/08/2023 0640 by Dan Maker, RN Outcome: Progressing   Problem: Ischemic Stroke/TIA Tissue Perfusion: Goal: Complications of ischemic stroke/TIA will be minimized Outcome: Progressing   Problem: Coping: Goal: Will identify appropriate support needs Outcome: Progressing   Problem: Health Behavior/Discharge Planning: Goal: Goals will be collaboratively established with patient/family Outcome: Progressing   Problem: Self-Care: Goal: Ability to participate in self-care as condition permits will improve Outcome: Progressing Goal: Ability to communicate needs accurately will improve Outcome: Progressing

## 2023-04-08 NOTE — Progress Notes (Signed)
Occupational Therapy Treatment Patient Details Name: Sarah Freeman MRN: [id number] DOB: Jan 27, 1931 Today's Date: 04/08/2023   History of present illness Shadaya Thunberg is a 89 yo F who comes to [facility] after acute onset insidious left hemi motor weakness while drinking coffee at breakfast.  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   OT comments  Upon entering room, pt found in recliner chair with neighbor present. Recliner chair found unlocked upon OT arrival. Pt agreeable to OT tx. Requires overall min guard - min assist throughout tx, standing from recliner with poor attn to L hemibody UE/LE, decreased safety awareness and poor mgmt of RW. Pt requires moderate cuing for RW mgmt and ambulatory transfer to toilet, significant inattention to L with pt neglecting to bring L hemibody through full turn before sitting on toilet. Completes toilet hygiene with min guard overall. Grooming tasks performed at the sink with poor incorporation of bimanual tasks; pt benefited from tactile cues to perform active opening of toothpaste with L hand. Visual perceptual difficulties noted with pt requiring OT assist to find washcloth in sink. Pt limited by impaired GMC/FMC of hemibody, with delays in force production during functional tasks, impaired visual perceptual abilities, decreased insight into deficits and hemibody weakness impacting safe occupational performance.    Recommendations for follow up therapy are one component of a multi-disciplinary discharge planning process, led by the attending physician.  Recommendations may be updated based on patient status, additional functional criteria and insurance authorization.    Assistance Recommended at Discharge Frequent or constant Supervision/Assistance  Patient can return home with the following  A little help with walking and/or transfers;A little help with bathing/dressing/bathroom;Assistance with cooking/housework;Assist for transportation;Help with  stairs or ramp for entrance;Direct supervision/assist for financial management;Direct supervision/assist for medications management   Equipment Recommendations  Other (comment)    Recommendations for Other Services      Precautions / Restrictions Precautions Precautions: Fall Precaution Comments: L inattention Restrictions Weight Bearing Restrictions: No       Mobility Bed Mobility                    Transfers Overall transfer level: Needs assistance Equipment used: None, Rolling walker (2 wheels) Transfers: Sit to/from Stand Sit to Stand: Min assist, Min guard           General transfer comment: Stands from recliner with cues provided for forward scooting and hemibody management     Balance Overall balance assessment: Needs assistance Sitting-balance support: No upper extremity supported, Feet supported Sitting balance-Leahy Scale: Good     Standing balance support: Single extremity supported Standing balance-Leahy Scale: Fair Standing balance comment: Stands at sink with use of unilateral support on sink/RW for steadiness, decreased weight through BOS in hemibody LE, cues for weight shift                           ADL either performed or assessed with clinical judgement   ADL Overall ADL's : Needs assistance/impaired     Grooming: Oral care;Brushing hair;Standing;Wash/dry hands;Wash/dry face;Minimal assistance;Cueing for safety Grooming Details (indicate cue type and reason): Tactile and vcs to incorporate LUE during bimanual tasks (combing hair, brushing teeth)                 Toilet Transfer: Min guard;Minimal assistance;Rolling walker (2 wheels);Cueing for sequencing Toilet Transfer Details (indicate cue type and reason): Cues for safety and incorporation of L hemibody Toileting- Clothing Manipulation and Hygiene: Minimal  assistance;Sit to/from stand;Cueing for safety;Cueing for sequencing Toileting - Clothing Manipulation Details  (indicate cue type and reason): tactile and vcs for L inattention/poor incorporation of L hand for pulling up briefs     Functional mobility during ADLs: Minimal assistance;Cueing for safety;Rolling walker (2 wheels) General ADL Comments: Decreased insight into deficits, poor managment of LLE during functional transfers, limited spatial awareness towards L, difficulties with visual perception during standing ADL tasks at sink. Able to incorporate LUE as gross stabilizer, cues for active assitance of manipulation toothpaste cap and toothbrush    Extremity/Trunk Assessment              Vision       Perception     Praxis      Cognition Arousal/Alertness: Awake/alert Behavior During Therapy: Impulsive (L inattention, decreased safety awareness and lacks insight into deficits) Overall Cognitive Status: Impaired/Different from baseline Area of Impairment: Awareness, Safety/judgement                         Safety/Judgement: Decreased awareness of safety, Decreased awareness of deficits                         Pertinent Vitals/ Pain       Pain Assessment Pain Assessment: No/denies pain  Home Living                                          Prior Functioning/Environment              Frequency  Min 2X/week        Progress Toward Goals  OT Goals(current goals can now be found in the care plan section)  Progress towards OT goals: Progressing toward goals     Plan Discharge plan remains appropriate    Co-evaluation                 AM-PAC OT "6 Clicks" Daily Activity     Outcome Measure   Help from another person eating meals?: A Little Help from another person taking care of personal grooming?: A Little Help from another person toileting, which includes using toliet, bedpan, or urinal?: A Little Help from another person bathing (including washing, rinsing, drying)?: A Little Help from another person to put on and  taking off regular upper body clothing?: A Little Help from another person to put on and taking off regular lower body clothing?: A Lot 6 Click Score: 17    End of Session Equipment Utilized During Treatment: Rolling walker (2 wheels)  OT Visit Diagnosis: Unsteadiness on feet (R26.81);Repeated falls (R29.6);Muscle weakness (generalized) (M62.81)   Activity Tolerance Patient tolerated treatment well   Patient Left in chair;with chair alarm set;with family/visitor present   Nurse Communication          Time: 1610-9604 OT Time Calculation (min): 21 min  Charges: OT General Charges $OT Visit: 1 Visit OT Treatments $Self Care/Home Management : 8-22 mins  Takelia Urieta L. Tayna Smethurst, OTR/L  04/08/23, 4:38 PM

## 2023-04-08 NOTE — Progress Notes (Signed)
[facility] Progress Note  Patient: Sarah Freeman    WUJ:[id number]  DOA: 04/06/2023     Date of Service: the patient was seen and examined on 04/08/2023  Chief Complaint  Patient presents with   Weakness   Code Stroke   Brief hospital course: Sarah Freeman is a 89 y.o. female with medical history significant of dementia, prior CVA in 2017, hypertension, hyperlipidemia, bilateral carotid artery stenosis, hypothyroidism, stage III CKD, GERD, aortic atherosclerosis, who presents to the ED due to left leg weakness.  History was obtained from both patient and her family at bedside.  Around 9:30 AM she was having difficulty getting out of the chair and felt increasing weakness in the left lower extremity. ED workup: BP 142/86, HR 74. O2 sat 100% on RA. afebrile Temp 98.2.  WBC of 10.6, sodium 133, glucose 125, creatinine 0.96 with GFR 56.  CT head: no evidence of acute intracranial abnormality.  Neurology consulted as part of code stroke. [facility] contacted for admission.   Assessment and Plan:  # Acute CVA Patient presented with left lower extremity weakness, which has been resolved.  No neurological deficit at this time. Previous history of CVA in 2017 with similar symptoms that was treated with DAPT for several years. Currently only on Plavix.  MRI brain, MRA H/N: Small acute infarct in the right corona radiata. Extensive chronic small vessel ischemic disease. Small chronic right frontal cortical infarcts. No large vessel occlusion. Intracranial atherosclerosis most notably involving the PCAs. No significant proximal stenosis in the anterior intracranial circulation.  Motion degraded neck MRA as detailed above, including limited assessment of the carotid bifurcations and left vertebral artery origin. No suspected flow limiting cervical ICA stenosis, however carotid doppler ultrasound or CTA could be performed for further evaluation if clinically warranted. LDL 132, hemoglobin A1c  6.0 Patient was seen by neurology, recommended dual antiplatelet therapy for 21 days followed by Plavix only. S/p ASA 325 mg x 1 dose, Started aspirin 81 mg p.o. daily, Plavix 75 mg p.o. daily home dose. Started simvastatin 20 mg p.o. daily low-dose as patient has documented allergy to statin causing leg cramps.  We can gradually advance the dose as per tolerance. Allow for permissive HTN (systolic < 220 and diastolic < 120)  Continue to monitor on telemetry TTE LVEF 60 to 65%, grade 1 diastolic dysfunction, negative PFO PT/OT eval done, recommend SNF placement [facility] consulted for placement.  Memory loss Likely vascular in nature given extensive small vessel disease on previous MRIs.   HTN (hypertension) - Hold home antihypertensives to allow for permissive hypertension   Chronic kidney disease, stage 3  Renal function currently at baseline.  Will recheck tomorrow morning after receiving contrast. -BMP in the a.m.   Chronic UTI - Continue chronic Macrobid   Hypothyroidism - Continue home levothyroxine  Constipation started MiraLAX twice daily, Dulcolax as needed.  Body mass index is 35.64 kg/m.  Interventions:     Diet: Regular diet DVT Prophylaxis: Subcutaneous Lovenox   Advance goals of care discussion: DNR  Family Communication: family was present at bedside, at the time of interview.  The pt provided permission to discuss medical plan with the family. Opportunity was given to ask question and all questions were answered satisfactorily.   Disposition:  Pt is from Home, admitted with Acute CVA, still has weakness due to debility, PT and OT recommended SNF placement, which precludes a safe discharge. Discharge to SNF, when bed will be available..  Subjective: No significant events overnight,  patient was resting comfortably, denied any neurological changes, no new complaints.  Patient's daughters were at bedside, all question and concerns answered, management plan discussed.   Awaiting for placement.   Physical Exam: General: NAD, lying comfortably Appear in no distress, affect appropriate Eyes: PERRLA ENT: Oral Mucosa Clear, moist  Neck: no JVD,  Cardiovascular: S1 and S2 Present, no Murmur,  Respiratory: good respiratory effort, Bilateral Air entry equal and Decreased, no Crackles, no wheezes Abdomen: Bowel Sound present, Soft and no tenderness,  Skin: no rashes Extremities: no Pedal edema, no calf tenderness Neurologic: without any new focal findings Gait not checked due to patient safety concerns  Vitals:   04/08/23 0451 04/08/23 0913 04/08/23 1114 04/08/23 1544  BP: (!) 159/75 117/69 116/73 (!) 162/108  Pulse: 60 69 66 79  Resp: 20 18 14 16   Temp: 98 F (36.7 C)  98.3 F (36.8 C) 97.8 F (36.6 C)  TempSrc: Oral  Oral Oral  SpO2: 98% 100% 96% 100%  Weight:      Height:        Intake/Output Summary (Last 24 hours) at 04/08/2023 1606 Last data filed at 04/08/2023 1415 Gross per 24 hour  Intake 480 ml  Output --  Net 480 ml   Filed Weights   04/06/23 1541  Weight: 72.1 kg    Data Reviewed: I have personally reviewed and interpreted daily labs, tele strips, imagings as discussed above. I reviewed all nursing notes, pharmacy notes, vitals, pertinent old records I have discussed plan of care as described above with RN and patient/family.  CBC: Recent Labs  Lab 04/06/23 1136 04/08/23 0607  WBC 10.6* 9.9  NEUTROABS 6.6  --   HGB 13.7 12.9  HCT 41.5 38.3  MCV 94.1 92.7  PLT 355 346   Basic Metabolic Panel: Recent Labs  Lab 04/06/23 1136 04/07/23 0600 04/08/23 0607  NA 133* 135 132*  K 3.9 3.5 3.9  CL 101 102 102  CO2 24 24 23   GLUCOSE 125* 108* 115*  BUN 10 10 11   CREATININE 0.96 0.84 0.94  CALCIUM 8.7* 8.4* 8.5*  MG  --   --  2.1  PHOS  --   --  3.3    Studies: No results found.  Scheduled Meds:  aspirin EC  81 mg Oral Daily   clopidogrel  75 mg Oral Daily   donepezil  5 mg Oral QHS   enoxaparin (LOVENOX)  injection  40 mg Subcutaneous Q24H   gabapentin  100 mg Oral BID   levothyroxine  50 mcg Oral QAC breakfast   nitrofurantoin (macrocrystal-monohydrate)  100 mg Oral QHS   pantoprazole  40 mg Oral Daily   polyethylene glycol  17 g Oral BID   simvastatin  20 mg Oral q1800   Continuous Infusions: PRN Meds: acetaminophen **OR** acetaminophen (TYLENOL) oral liquid 160 mg/5 mL **OR** acetaminophen, bisacodyl, bisacodyl  Time spent: 35 minutes  Author: Gillis Santa. MD [facility] 04/08/2023 4:06 PM  To reach On-call, see care teams to locate the attending and reach out to them via [email or web address]. If 7PM-7AM, please contact night-coverage If you still have difficulty reaching the attending provider, please page the [facility] (Director on Call) for [facility] on amion for assistance.

## 2023-04-08 NOTE — TOC Progression Note (Addendum)
Transition of Care [facility]) - Progression Note    Patient Details  Name: Sarah Freeman MRN: [id number] Date of Birth: [birth year]  Transition of Care [facility]) CM/SW Contact  Allena Katz, LCSW Phone Number: 04/08/2023, 2:59 PM  Clinical Narrative:   Referrals sent to [facility] per daughter request.  3:19pm CSW spoke with daughter to inform [name] is able to accept. Daughter agreeable. CSW will start auth for Monday. Daughter would like to see if patient can go by safe transport.        Expected Discharge Plan and Services                                               Social Determinants of Health (SDOH) Interventions SDOH Screenings   Food Insecurity: No Food Insecurity (04/06/2023)  Housing: Low Risk  (04/06/2023)  Transportation Needs: No Transportation Needs (04/06/2023)  Utilities: Not At Risk (04/06/2023)  Alcohol Screen: Low Risk  (02/01/2023)  Depression (PHQ2-9): Low Risk  (02/01/2023)  Financial Resource Strain: Low Risk  (02/01/2023)  Physical Activity: Insufficiently Active (02/01/2023)  Social Connections: Moderately Isolated (02/01/2023)  Stress: No Stress Concern Present (02/01/2023)  Tobacco Use: Low Risk  (04/06/2023)    Readmission Risk Interventions     No data to display

## 2023-04-08 NOTE — NC FL2 (Signed)
[place] MEDICAID FL2 LEVEL OF CARE FORM     IDENTIFICATION  Patient Name: Sarah Freeman Birthdate: 1931-02-27 Sex: female Admission Date (Current Location): 04/06/2023  [facility] and [place] Number:  Chiropodist and Address:         Provider Number: [id number]  Attending Physician Name and Address:  Gillis Santa, MD  Relative Name and Phone Number:  Kennon Rounds (Daughter) [phone]    Current Level of Care: Hospital Recommended Level of Care: Skilled Nursing Facility Prior Approval Number:    Date Approved/Denied:   PASRR Number: [id number]  Discharge Plan: SNF    Current Diagnoses: Patient Active Problem List   Diagnosis Date Noted   Acute stroke due to ischemia (HCC) 04/07/2023   Acute CVA (cerebrovascular accident) (HCC) 04/07/2023   Dyslipidemia 04/06/2023   Memory loss 12/29/2021   Aortic atherosclerosis (HCC) 02/27/2021   SVT (supraventricular tachycardia) 10/11/2018   HTN (hypertension) 10/11/2018   Chronic kidney disease, stage 3 (HCC) 05/24/2018   Morbid obesity (HCC) 02/02/2018   Carotid stenosis 02/01/2018   Atherosclerosis 01/25/2018   Left arm weakness 01/18/2018   Chronic UTI 11/04/2017   Statin intolerance 06/14/2017   Counseling regarding advanced directives and goals of care 02/22/2017   Personal history of transient ischemic attack (TIA), and cerebral infarction without residual deficits 02/25/2016   Weakness of left leg 02/25/2016   TIA (transient ischemic attack) 02/15/2016   Osteopenia 01/29/2016   Menopausal state 01/29/2016   Hypothyroidism 01/29/2016   GERD (gastroesophageal reflux disease) 01/29/2016   Hypercholesterolemia 01/29/2016   Lumbar spinal stenosis 01/29/2016   Stress incontinence 01/29/2016   Breast microcalcification, mammographic 06/17/2015    Orientation RESPIRATION BLADDER Height & Weight     Time, Situation  Normal Continent Weight: 158 lb 15.2 oz (72.1 kg) Height:  4\' 8"  (142.2 cm)  BEHAVIORAL  SYMPTOMS/MOOD NEUROLOGICAL BOWEL NUTRITION STATUS      Continent Diet  AMBULATORY STATUS COMMUNICATION OF NEEDS Skin   Limited Assist   Normal                       Personal Care Assistance Level of Assistance  Bathing, Feeding, Dressing Bathing Assistance: Maximum assistance Feeding assistance: Limited assistance Dressing Assistance: Maximum assistance     Functional Limitations Info  Sight, Speech, Hearing Sight Info: Impaired Hearing Info: Impaired Speech Info: Adequate    SPECIAL CARE FACTORS FREQUENCY  PT (By licensed PT), OT (By licensed OT)     PT Frequency: 5 Times a week OT Frequency: 5 times a week            Contractures Contractures Info: Not present    Additional Factors Info  Code Status Code Status Info: DNR             Current Medications (04/08/2023):  This is the current hospital active medication list Current Facility-Administered Medications  Medication Dose Route Frequency Provider Last Rate Last Admin   acetaminophen (TYLENOL) tablet 650 mg  650 mg Oral Q4H PRN Verdene Lennert, MD       Or   acetaminophen (TYLENOL) 160 MG/5ML solution 650 mg  650 mg Per Tube Q4H PRN Verdene Lennert, MD       Or   acetaminophen (TYLENOL) suppository 650 mg  650 mg Rectal Q4H PRN Verdene Lennert, MD       aspirin EC tablet 81 mg  81 mg Oral Daily Gillis Santa, MD   81 mg at 04/08/23 1008   bisacodyl (DULCOLAX) EC  tablet 10 mg  10 mg Oral Daily PRN Gillis Santa, MD       bisacodyl (DULCOLAX) suppository 10 mg  10 mg Rectal Daily PRN Gillis Santa, MD       clopidogrel (PLAVIX) tablet 75 mg  75 mg Oral Daily Verdene Lennert, MD   75 mg at 04/08/23 1008   donepezil (ARICEPT) tablet 5 mg  5 mg Oral QHS Verdene Lennert, MD   5 mg at 04/07/23 2146   enoxaparin (LOVENOX) injection 40 mg  40 mg Subcutaneous Q24H Verdene Lennert, MD   40 mg at 04/07/23 2147   gabapentin (NEURONTIN) capsule 100 mg  100 mg Oral BID Verdene Lennert, MD   100 mg at 04/08/23 1008    levothyroxine (SYNTHROID) tablet 50 mcg  50 mcg Oral QAC breakfast Verdene Lennert, MD   50 mcg at 04/08/23 0554   nitrofurantoin (macrocrystal-monohydrate) (MACROBID) capsule 100 mg  100 mg Oral QHS Verdene Lennert, MD   100 mg at 04/07/23 2147   pantoprazole (PROTONIX) EC tablet 40 mg  40 mg Oral Daily Verdene Lennert, MD   40 mg at 04/08/23 1008   polyethylene glycol (MIRALAX / GLYCOLAX) packet 17 g  17 g Oral BID Gillis Santa, MD   17 g at 04/08/23 1217   simvastatin (ZOCOR) tablet 20 mg  20 mg Oral q1800 Gillis Santa, MD   20 mg at 04/07/23 1806     Discharge Medications: Please see discharge summary for a list of discharge medications.  Relevant Imaging Results:  Relevant Lab Results:   Additional Information SS # [id number]  Allena Katz, LCSW

## 2023-04-09 DIAGNOSIS — I639 Cerebral infarction, unspecified: Secondary | ICD-10-CM | POA: Diagnosis not present

## 2023-04-09 MED ORDER — VITAMIN B-12 1000 MCG PO TABS
1000.0000 ug | ORAL_TABLET | Freq: Every day | ORAL | Status: DC
  Administered 2023-04-09 – 2023-04-18 (×10): 1000 ug via ORAL
  Filled 2023-04-09 (×10): qty 1

## 2023-04-09 NOTE — TOC Progression Note (Signed)
Transition of Care [facility]) - Progression Note    Patient Details  Name: Sarah Freeman MRN: [id number] Date of Birth: 1931-06-02  [facility]) CM/SW Contact  Kemper Durie, RN Phone Number: 04/09/2023, 10:51 AM  Clinical Narrative:     Berkley Harvey started for discharge to [facility].  Vesta Mixer ID [id number].       Expected Discharge Plan and Services                                               Social Determinants of Health (SDOH) Interventions SDOH Screenings   Food Insecurity: No Food Insecurity (04/06/2023)  Housing: Low Risk  (04/06/2023)  Transportation Needs: No Transportation Needs (04/06/2023)  Utilities: Not At Risk (04/06/2023)  Alcohol Screen: Low Risk  (02/01/2023)  Depression (PHQ2-9): Low Risk  (02/01/2023)  Financial Resource Strain: Low Risk  (02/01/2023)  Physical Activity: Insufficiently Active (02/01/2023)  Social Connections: Moderately Isolated (02/01/2023)  Stress: No Stress Concern Present (02/01/2023)  Tobacco Use: Low Risk  (04/06/2023)    Readmission Risk Interventions     No data to display

## 2023-04-09 NOTE — Plan of Care (Signed)
  Problem: Education: Goal: Knowledge of disease or condition will improve Outcome: Progressing   Problem: Ischemic Stroke/TIA Tissue Perfusion: Goal: Complications of ischemic stroke/TIA will be minimized Outcome: Progressing   Problem: Coping: Goal: Will verbalize positive feelings about self Outcome: Progressing   Problem: Health Behavior/Discharge Planning: Goal: Ability to manage health-related needs will improve Outcome: Progressing   Problem: Self-Care: Goal: Ability to participate in self-care as condition permits will improve Outcome: Progressing   Problem: Nutrition: Goal: Risk of aspiration will decrease Outcome: Progressing   Problem: Education: Goal: Knowledge of General Education information will improve Description: Including pain rating scale, medication(s)/side effects and non-pharmacologic comfort measures Outcome: Progressing   Problem: Health Behavior/Discharge Planning: Goal: Ability to manage health-related needs will improve Outcome: Progressing   Problem: Activity: Goal: Risk for activity intolerance will decrease Outcome: Progressing   Problem: Nutrition: Goal: Adequate nutrition will be maintained Outcome: Progressing   Problem: Coping: Goal: Level of anxiety will decrease Outcome: Progressing   Problem: Pain Managment: Goal: General experience of comfort will improve Outcome: Progressing   Problem: Safety: Goal: Ability to remain free from injury will improve Outcome: Progressing   Problem: Skin Integrity: Goal: Risk for impaired skin integrity will decrease Outcome: Progressing

## 2023-04-09 NOTE — Progress Notes (Signed)
[facility] Progress Note  Patient: Sarah Freeman    ZOX:[id number]  DOA: 04/06/2023     Date of Service: the patient was seen and examined on 04/09/2023  Chief Complaint  Patient presents with   Weakness   Code Stroke   Brief hospital course: Sarah Freeman is a 89 y.o. female with medical history significant of dementia, prior CVA in 2017, hypertension, hyperlipidemia, bilateral carotid artery stenosis, hypothyroidism, stage III CKD, GERD, aortic atherosclerosis, who presents to the ED due to left leg weakness.  History was obtained from both patient and her family at bedside.  Around 9:30 AM she was having difficulty getting out of the chair and felt increasing weakness in the left lower extremity. ED workup: BP 142/86, HR 74. O2 sat 100% on RA. afebrile Temp 98.2.  WBC of 10.6, sodium 133, glucose 125, creatinine 0.96 with GFR 56.  CT head: no evidence of acute intracranial abnormality.  Neurology consulted as part of code stroke. [facility] contacted for admission.   Assessment and Plan:  # Acute CVA Patient presented with left lower extremity weakness, which has been resolved.  No neurological deficit at this time. Previous history of CVA in 2017 with similar symptoms that was treated with DAPT for several years. Currently only on Plavix.  MRI brain, MRA H/N: Small acute infarct in the right corona radiata. Extensive chronic small vessel ischemic disease. Small chronic right frontal cortical infarcts. No large vessel occlusion. Intracranial atherosclerosis most notably involving the PCAs. No significant proximal stenosis in the anterior intracranial circulation.  Motion degraded neck MRA as detailed above, including limited assessment of the carotid bifurcations and left vertebral artery origin. No suspected flow limiting cervical ICA stenosis, however carotid doppler ultrasound or CTA could be performed for further evaluation if clinically warranted. LDL 132, hemoglobin A1c  6.0 Patient was seen by neurology, recommended dual antiplatelet therapy for 21 days followed by Plavix only. S/p ASA 325 mg x 1 dose, Started aspirin 81 mg p.o. daily, Plavix 75 mg p.o. daily home dose. Started simvastatin 20 mg p.o. daily low-dose as patient has documented allergy to statin causing leg cramps.  We can gradually advance the dose as per tolerance. Allow for permissive HTN (systolic < 220 and diastolic < 120)  Continue to monitor on telemetry TTE LVEF 60 to 65%, grade 1 diastolic dysfunction, negative PFO PT/OT eval done, recommend SNF placement [facility] consulted for placement.  Memory loss Likely vascular in nature given extensive small vessel disease on previous MRIs.   HTN (hypertension) - Hold home antihypertensives to allow for permissive hypertension   Chronic kidney disease, stage 3  Renal function currently at baseline.  Will recheck tomorrow morning after receiving contrast. -BMP in the a.m.   Chronic UTI - Continue chronic Macrobid   Hypothyroidism - Continue home levothyroxine  Constipation started MiraLAX twice daily, Dulcolax as needed.  Vitamin B12 level 230, goal >400, started vitamin B12 1000 mcg p.o. daily, repeat vitamin B12 level after 3 to 6 months.   Body mass index is 35.64 kg/m.  Interventions:     Diet: Regular diet DVT Prophylaxis: Subcutaneous Lovenox   Advance goals of care discussion: DNR  Family Communication: family was present at bedside, at the time of interview.  The pt provided permission to discuss medical plan with the family. Opportunity was given to ask question and all questions were answered satisfactorily.   Disposition:  Pt is from Home, admitted with Acute CVA, still has weakness due to debility, PT  and OT recommended SNF placement, which precludes a safe discharge. Discharge to SNF, when bed will be available.  Insurance Berkley Harvey is pending, most likely will be done on Monday  Subjective: No significant events  overnight, patient was resting comfortably, denied any neurological changes, no new complaints.  Patient's daughters were at bedside, all question and concerns answered, management plan discussed.  Awaiting for placement. Patient did move bowels, constipation resolved.  Physical Exam: General: NAD, lying comfortably Appear in no distress, affect appropriate Eyes: PERRLA ENT: Oral Mucosa Clear, moist  Neck: no JVD,  Cardiovascular: S1 and S2 Present, no Murmur,  Respiratory: good respiratory effort, Bilateral Air entry equal and Decreased, no Crackles, no wheezes Abdomen: Bowel Sound present, Soft and no tenderness,  Skin: no rashes Extremities: no Pedal edema, no calf tenderness Neurologic: without any new focal findings Gait not checked due to patient safety concerns  Vitals:   04/08/23 1950 04/09/23 0356 04/09/23 0913 04/09/23 1225  BP: 125/67 123/64 103/75 121/61  Pulse: 70 65 73 69  Resp: 16 16 16 16   Temp:  98 F (36.7 C) 98.2 F (36.8 C) (!) 97.4 F (36.3 C)  TempSrc: Oral   Oral  SpO2: 98% 99% 97% 99%  Weight:      Height:        Intake/Output Summary (Last 24 hours) at 04/09/2023 1505 Last data filed at 04/09/2023 1426 Gross per 24 hour  Intake 1200 ml  Output --  Net 1200 ml   Filed Weights   04/06/23 1541  Weight: 72.1 kg    Data Reviewed: I have personally reviewed and interpreted daily labs, tele strips, imagings as discussed above. I reviewed all nursing notes, pharmacy notes, vitals, pertinent old records I have discussed plan of care as described above with RN and patient/family.  CBC: Recent Labs  Lab 04/06/23 1136 04/08/23 0607  WBC 10.6* 9.9  NEUTROABS 6.6  --   HGB 13.7 12.9  HCT 41.5 38.3  MCV 94.1 92.7  PLT 355 346   Basic Metabolic Panel: Recent Labs  Lab 04/06/23 1136 04/07/23 0600 04/08/23 0607  NA 133* 135 132*  K 3.9 3.5 3.9  CL 101 102 102  CO2 24 24 23   GLUCOSE 125* 108* 115*  BUN 10 10 11   CREATININE 0.96 0.84 0.94   CALCIUM 8.7* 8.4* 8.5*  MG  --   --  2.1  PHOS  --   --  3.3    Studies: No results found.  Scheduled Meds:  aspirin EC  81 mg Oral Daily   clopidogrel  75 mg Oral Daily   vitamin B-12  1,000 mcg Oral Daily   donepezil  5 mg Oral QHS   enoxaparin (LOVENOX) injection  40 mg Subcutaneous Q24H   gabapentin  100 mg Oral BID   levothyroxine  50 mcg Oral QAC breakfast   nitrofurantoin (macrocrystal-monohydrate)  100 mg Oral QHS   pantoprazole  40 mg Oral Daily   polyethylene glycol  17 g Oral BID   simvastatin  20 mg Oral q1800   Continuous Infusions: PRN Meds: acetaminophen **OR** acetaminophen (TYLENOL) oral liquid 160 mg/5 mL **OR** acetaminophen, bisacodyl, bisacodyl  Time spent: 35 minutes  Author: Gillis Santa. MD [facility] 04/09/2023 3:05 PM  To reach On-call, see care teams to locate the attending and reach out to them via [email or web address]. If 7PM-7AM, please contact night-coverage If you still have difficulty reaching the attending provider, please page the [facility] (Director on Call) for [facility] on  amion for assistance.

## 2023-04-09 NOTE — Plan of Care (Signed)
  Problem: Education: Goal: Knowledge of disease or condition will improve Outcome: Progressing   Problem: Coping: Goal: Will verbalize positive feelings about self Outcome: Progressing Goal: Will identify appropriate support needs Outcome: Progressing   Problem: Nutrition: Goal: Risk of aspiration will decrease Outcome: Progressing Goal: Dietary intake will improve Outcome: Progressing   

## 2023-04-10 DIAGNOSIS — I639 Cerebral infarction, unspecified: Secondary | ICD-10-CM | POA: Diagnosis not present

## 2023-04-10 MED ORDER — TRAZODONE HCL 50 MG PO TABS: 50.0000 mg | ORAL_TABLET | Freq: Every evening | ORAL | Status: DC | PRN

## 2023-04-10 MED ORDER — QUETIAPINE FUMARATE 25 MG PO TABS
25.0000 mg | ORAL_TABLET | Freq: Two times a day (BID) | ORAL | Status: DC | PRN
  Filled 2023-04-10: qty 1

## 2023-04-10 MED ORDER — MELATONIN 5 MG PO TABS
5.0000 mg | ORAL_TABLET | Freq: Every day | ORAL | Status: DC
  Administered 2023-04-10 – 2023-04-17 (×7): 5 mg via ORAL
  Filled 2023-04-10 (×7): qty 1

## 2023-04-10 NOTE — TOC Progression Note (Signed)
Transition of Care [facility]) - Progression Note    Patient Details  Name: Sarah Freeman MRN: [id number] Date of Birth: Jan 06, 1931  Transition of Care [facility]) CM/SW Contact  Kemper Durie, RN Phone Number: 04/10/2023, 1:22 PM  Clinical Narrative:      Insurance auth approved through 5/14, MD aware.  Auth ID [id number].      Expected Discharge Plan and Services                                               Social Determinants of Health (SDOH) Interventions SDOH Screenings   Food Insecurity: No Food Insecurity (04/06/2023)  Housing: Low Risk  (04/06/2023)  Transportation Needs: No Transportation Needs (04/06/2023)  Utilities: Not At Risk (04/06/2023)  Alcohol Screen: Low Risk  (02/01/2023)  Depression (PHQ2-9): Low Risk  (02/01/2023)  Financial Resource Strain: Low Risk  (02/01/2023)  Physical Activity: Insufficiently Active (02/01/2023)  Social Connections: Moderately Isolated (02/01/2023)  Stress: No Stress Concern Present (02/01/2023)  Tobacco Use: Low Risk  (04/06/2023)    Readmission Risk Interventions     No data to display

## 2023-04-10 NOTE — Progress Notes (Signed)
PT Cancellation Note  Patient Details Name: Sarah Freeman MRN: [id number] DOB: 1931-02-17   Cancelled Treatment:    Reason Eval/Treat Not Completed: Fatigue/lethargy limiting ability to participate  Pt in chair. Pt and son stated she has been up several times walking to bathroom with staff this am and is generally fatigued from activity.  Will defer session today.   Danielle Dess 04/10/2023, 11:56 AM

## 2023-04-10 NOTE — Progress Notes (Signed)
[facility] Progress Note  Patient: Sarah Freeman    ZOX:[id number]  DOA: 04/06/2023     Date of Service: the patient was seen and examined on 04/10/2023  Chief Complaint  Patient presents with   Weakness   Code Stroke   Brief hospital course: Sarah Freeman is a 89 y.o. female with medical history significant of dementia, prior CVA in 2017, hypertension, hyperlipidemia, bilateral carotid artery stenosis, hypothyroidism, stage III CKD, GERD, aortic atherosclerosis, who presents to the ED due to left leg weakness.  History was obtained from both patient and her family at bedside.  Around 9:30 AM she was having difficulty getting out of the chair and felt increasing weakness in the left lower extremity. ED workup: BP 142/86, HR 74. O2 sat 100% on RA. afebrile Temp 98.2.  WBC of 10.6, sodium 133, glucose 125, creatinine 0.96 with GFR 56.  CT head: no evidence of acute intracranial abnormality.  Neurology consulted as part of code stroke. [facility] contacted for admission.   Assessment and Plan:  # Acute CVA Patient presented with left lower extremity weakness, which has been resolved.  No neurological deficit at this time. Previous history of CVA in 2017 with similar symptoms that was treated with DAPT for several years. Currently only on Plavix.  MRI brain, MRA H/N: Small acute infarct in the right corona radiata. Extensive chronic small vessel ischemic disease. Small chronic right frontal cortical infarcts. No large vessel occlusion. Intracranial atherosclerosis most notably involving the PCAs. No significant proximal stenosis in the anterior intracranial circulation.  Motion degraded neck MRA as detailed above, including limited assessment of the carotid bifurcations and left vertebral artery origin. No suspected flow limiting cervical ICA stenosis, however carotid doppler ultrasound or CTA could be performed for further evaluation if clinically warranted. LDL 132, hemoglobin A1c  6.0 Patient was seen by neurology, recommended dual antiplatelet therapy for 21 days followed by Plavix only. S/p ASA 325 mg x 1 dose, Started aspirin 81 mg p.o. daily, Plavix 75 mg p.o. daily home dose. Started simvastatin 20 mg p.o. daily low-dose as patient has documented allergy to statin causing leg cramps.  We can gradually advance the dose as per tolerance. Allow for permissive HTN (systolic < 220 and diastolic < 120)  Continue to monitor on telemetry TTE LVEF 60 to 65%, grade 1 diastolic dysfunction, negative PFO PT/OT eval done, recommend SNF placement [facility] consulted for placement.  # Memory loss Likely vascular in nature given extensive small vessel disease on previous MRIs.   # HTN (hypertension) - Hold home antihypertensives to allow for permissive hypertension   # Chronic kidney disease, stage 3  Renal function currently at baseline.  Will recheck tomorrow morning after receiving contrast. -BMP in the a.m.   # Chronic UTI - Continue chronic Macrobid   # Hypothyroidism - Continue home levothyroxine  # Constipation started MiraLAX twice daily, Dulcolax as needed.  # Vitamin B12 level 230, goal >400, started vitamin B12 1000 mcg p.o. daily, repeat vitamin B12 level after 3 to 6 months.   Body mass index is 35.64 kg/m.  Interventions:     Diet: Regular diet DVT Prophylaxis: Subcutaneous Lovenox   Advance goals of care discussion: DNR  Family Communication: family was present at bedside, at the time of interview.  The pt provided permission to discuss medical plan with the family. Opportunity was given to ask question and all questions were answered satisfactorily.   Disposition:  Pt is from Home, admitted with Acute CVA,  still has weakness due to debility, PT and OT recommended SNF placement, which precludes a safe discharge. Discharge to SNF, when bed will be available.  Insurance Berkley Harvey is pending, most likely will be done on Monday  Subjective: No significant  events overnight, patient was resting comfortably, denied any neurological changes, no new complaints.  Patient did move bowels today morning, had headache in the morning which has been resolved.  No any other complaints.  Agreed for SNF placement.   Physical Exam: General: NAD, lying comfortably Appear in no distress, affect appropriate Eyes: PERRLA ENT: Oral Mucosa Clear, moist  Neck: no JVD,  Cardiovascular: S1 and S2 Present, no Murmur,  Respiratory: good respiratory effort, Bilateral Air entry equal and Decreased, no Crackles, no wheezes Abdomen: Bowel Sound present, Soft and no tenderness,  Skin: no rashes Extremities: no Pedal edema, no calf tenderness Neurologic: without any new focal findings Gait not checked due to patient safety concerns  Vitals:   04/09/23 1633 04/09/23 2053 04/10/23 0552 04/10/23 0939  BP: (!) 144/68 137/65 112/62 131/71  Pulse: 71 65 (!) 57 64  Resp: 18 18 18 18   Temp:  97.8 F (36.6 C) 98.1 F (36.7 C) 97.9 F (36.6 C)  TempSrc:  Oral Oral   SpO2: 99% 97% 96% 98%  Weight:      Height:        Intake/Output Summary (Last 24 hours) at 04/10/2023 1554 Last data filed at 04/10/2023 1426 Gross per 24 hour  Intake 480 ml  Output --  Net 480 ml   Filed Weights   04/06/23 1541  Weight: 72.1 kg    Data Reviewed: I have personally reviewed and interpreted daily labs, tele strips, imagings as discussed above. I reviewed all nursing notes, pharmacy notes, vitals, pertinent old records I have discussed plan of care as described above with RN and patient/family.  CBC: Recent Labs  Lab 04/06/23 1136 04/08/23 0607  WBC 10.6* 9.9  NEUTROABS 6.6  --   HGB 13.7 12.9  HCT 41.5 38.3  MCV 94.1 92.7  PLT 355 346   Basic Metabolic Panel: Recent Labs  Lab 04/06/23 1136 04/07/23 0600 04/08/23 0607  NA 133* 135 132*  K 3.9 3.5 3.9  CL 101 102 102  CO2 24 24 23   GLUCOSE 125* 108* 115*  BUN 10 10 11   CREATININE 0.96 0.84 0.94  CALCIUM 8.7*  8.4* 8.5*  MG  --   --  2.1  PHOS  --   --  3.3    Studies: No results found.  Scheduled Meds:  aspirin EC  81 mg Oral Daily   clopidogrel  75 mg Oral Daily   vitamin B-12  1,000 mcg Oral Daily   donepezil  5 mg Oral QHS   enoxaparin (LOVENOX) injection  40 mg Subcutaneous Q24H   gabapentin  100 mg Oral BID   levothyroxine  50 mcg Oral QAC breakfast   nitrofurantoin (macrocrystal-monohydrate)  100 mg Oral QHS   pantoprazole  40 mg Oral Daily   polyethylene glycol  17 g Oral BID   simvastatin  20 mg Oral q1800   Continuous Infusions: PRN Meds: acetaminophen **OR** acetaminophen (TYLENOL) oral liquid 160 mg/5 mL **OR** acetaminophen, bisacodyl, bisacodyl  Time spent: 35 minutes  Author: Gillis Santa. MD [facility] 04/10/2023 3:54 PM  To reach On-call, see care teams to locate the attending and reach out to them via [email or web address]. If 7PM-7AM, please contact night-coverage If you still have difficulty reaching the attending provider,  please page the [facility] (Director on Call) for [facility] on amion for assistance.

## 2023-04-10 NOTE — Plan of Care (Signed)
  Problem: Education: Goal: Knowledge of disease or condition will improve Outcome: Progressing Goal: Knowledge of secondary prevention will improve (MUST DOCUMENT ALL) Outcome: Progressing Goal: Knowledge of patient specific risk factors will improve Sarah Freeman N/A or DELETE if not current risk factor) Outcome: Progressing   Problem: Nutrition: Goal: Risk of aspiration will decrease Outcome: Progressing   Problem: Education: Goal: Knowledge of General Education information will improve Description: Including pain rating scale, medication(s)/side effects and non-pharmacologic comfort measures Outcome: Progressing   Problem: Activity: Goal: Risk for activity intolerance will decrease Outcome: Progressing   Problem: Pain Managment: Goal: General experience of comfort will improve Outcome: Progressing   Problem: Safety: Goal: Ability to remain free from injury will improve Outcome: Progressing   Problem: Skin Integrity: Goal: Risk for impaired skin integrity will decrease Outcome: Progressing

## 2023-04-11 DIAGNOSIS — I639 Cerebral infarction, unspecified: Secondary | ICD-10-CM | POA: Diagnosis not present

## 2023-04-11 MED ORDER — TRAZODONE HCL 50 MG PO TABS
50.0000 mg | ORAL_TABLET | Freq: Every day | ORAL | Status: DC
  Administered 2023-04-11: 50 mg via ORAL
  Filled 2023-04-11: qty 1

## 2023-04-11 MED ORDER — HALOPERIDOL LACTATE 5 MG/ML IJ SOLN
2.0000 mg | Freq: Once | INTRAMUSCULAR | Status: DC
  Filled 2023-04-11: qty 1

## 2023-04-11 MED ORDER — LORAZEPAM 2 MG/ML IJ SOLN: 0.5000 mg | Freq: Once | INTRAMUSCULAR | Status: DC

## 2023-04-11 MED ORDER — QUETIAPINE FUMARATE 25 MG PO TABS
25.0000 mg | ORAL_TABLET | Freq: Every evening | ORAL | Status: DC
  Administered 2023-04-11: 25 mg via ORAL
  Filled 2023-04-11 (×2): qty 1

## 2023-04-11 MED ORDER — HALOPERIDOL LACTATE 5 MG/ML IJ SOLN
2.0000 mg | Freq: Once | INTRAMUSCULAR | Status: AC
  Administered 2023-04-11: 2 mg via INTRAMUSCULAR

## 2023-04-11 MED ORDER — LORAZEPAM 2 MG/ML IJ SOLN
0.5000 mg | Freq: Once | INTRAMUSCULAR | Status: AC
  Administered 2023-04-11: 0.5 mg via INTRAMUSCULAR
  Filled 2023-04-11: qty 1

## 2023-04-11 NOTE — Progress Notes (Signed)
[facility] Progress Note  Patient: Sarah Freeman    ZOX:[id number]  DOA: 04/06/2023     Date of Service: the patient was seen and examined on 04/11/2023  Chief Complaint  Patient presents with   Weakness   Code Stroke   Brief hospital course: Sarah Freeman is a 89 y.o. female with medical history significant of dementia, prior CVA in 2017, hypertension, hyperlipidemia, bilateral carotid artery stenosis, hypothyroidism, stage III CKD, GERD, aortic atherosclerosis, who presents to the ED due to left leg weakness.  History was obtained from both patient and her family at bedside.  Around 9:30 AM she was having difficulty getting out of the chair and felt increasing weakness in the left lower extremity. ED workup: BP 142/86, HR 74. O2 sat 100% on RA. afebrile Temp 98.2.  WBC of 10.6, sodium 133, glucose 125, creatinine 0.96 with GFR 56.  CT head: no evidence of acute intracranial abnormality.  Neurology consulted as part of code stroke. [facility] contacted for admission.   Assessment and Plan:  # Acute CVA Patient presented with left lower extremity weakness, which has been resolved.  No neurological deficit at this time. Previous history of CVA in 2017 with similar symptoms that was treated with DAPT for several years. Currently only on Plavix.  MRI brain, MRA H/N: Small acute infarct in the right corona radiata. Extensive chronic small vessel ischemic disease. Small chronic right frontal cortical infarcts. No large vessel occlusion. Intracranial atherosclerosis most notably involving the PCAs. No significant proximal stenosis in the anterior intracranial circulation.  Motion degraded neck MRA as detailed above, including limited assessment of the carotid bifurcations and left vertebral artery origin. No suspected flow limiting cervical ICA stenosis, however carotid doppler ultrasound or CTA could be performed for further evaluation if clinically warranted. LDL 132, hemoglobin A1c  6.0 Patient was seen by neurology, recommended dual antiplatelet therapy for 21 days followed by Plavix only. S/p ASA 325 mg x 1 dose, Started aspirin 81 mg p.o. daily, Plavix 75 mg p.o. daily home dose. Started simvastatin 20 mg p.o. daily low-dose as patient has documented allergy to statin causing leg cramps.  We can gradually advance the dose as per tolerance. Allow for permissive HTN (systolic < 220 and diastolic < 120)  Continue to monitor on telemetry TTE LVEF 60 to 65%, grade 1 diastolic dysfunction, negative PFO PT/OT eval done, recommend SNF placement [facility] consulted for placement.  # Sundowning and agitation 5/13, last night patient was very agitated, patient was given 1 dose of Haldol.  Patient was sleepy in the morning. Started Seroquel 25 mg p.o. nightly and trazodone 50 mg p.o. nightly for insomnia  # Memory loss Likely vascular in nature given extensive small vessel disease on previous MRIs.   # HTN (hypertension) - Hold home antihypertensives to allow for permissive hypertension   # Chronic kidney disease, stage 3  Renal function currently at baseline.  Will recheck tomorrow morning after receiving contrast. -BMP in the a.m.   # Chronic UTI - Continue chronic Macrobid   # Hypothyroidism - Continue home levothyroxine  # Constipation started MiraLAX twice daily, Dulcolax as needed.  # Vitamin B12 level 230, goal >400, started vitamin B12 1000 mcg p.o. daily, repeat vitamin B12 level after 3 to 6 months.    Body mass index is 35.64 kg/m.  Interventions:     Diet: Regular diet DVT Prophylaxis: Subcutaneous Lovenox   Advance goals of care discussion: DNR  Family Communication: family was present at bedside,  at the time of interview.  The pt provided permission to discuss medical plan with the family. Opportunity was given to ask question and all questions were answered satisfactorily.   Disposition:  Pt is from Home, admitted with Acute CVA, still has  weakness due to debility, PT and OT recommended SNF placement, which precludes a safe discharge. Discharge to SNF, when bed will be available.  Firefighter received.   SNF will not except the patient for 3 days because she received Haldol as per TOC.   Subjective: Overnight patient developed sundowning and very agitated, patient received dose of Haldol, so patient was sleepy in the morning.  Discussed with patient's daughter that medically she is optimized and we can discharge her to SNF when bed will be available. Patient's daughter agreed to start Seroquel.   Physical Exam: General: NAD, lying comfortably Appear in no distress, sleepy, resting comfortably Eyes: Closed, sleepy ENT: Oral Mucosa Clear, moist  Neck: no JVD,  Cardiovascular: S1 and S2 Present, no Murmur,  Respiratory: good respiratory effort, Bilateral Air entry equal and Decreased, no Crackles, no wheezes Abdomen: Bowel Sound present, Soft and no tenderness,  Skin: no rashes Extremities: no Pedal edema, no calf tenderness Neurologic: without any new focal findings Gait not checked due to patient safety concerns  Vitals:   04/10/23 2046 04/11/23 0453 04/11/23 0745 04/11/23 1201  BP: (!) 113/93 (!) 146/71 126/66 128/72  Pulse: 94 81 94 95  Resp: 16 20 18 18   Temp: 98.3 F (36.8 C) 98.1 F (36.7 C) 97.9 F (36.6 C) 97.6 F (36.4 C)  TempSrc: Oral Oral Oral   SpO2: 99% 99% 96% 97%  Weight:      Height:        Intake/Output Summary (Last 24 hours) at 04/11/2023 1543 Last data filed at 04/11/2023 1431 Gross per 24 hour  Intake 720 ml  Output --  Net 720 ml   Filed Weights   04/06/23 1541  Weight: 72.1 kg    Data Reviewed: I have personally reviewed and interpreted daily labs, tele strips, imagings as discussed above. I reviewed all nursing notes, pharmacy notes, vitals, pertinent old records I have discussed plan of care as described above with RN and patient/family.  CBC: Recent Labs  Lab  04/06/23 1136 04/08/23 0607  WBC 10.6* 9.9  NEUTROABS 6.6  --   HGB 13.7 12.9  HCT 41.5 38.3  MCV 94.1 92.7  PLT 355 346   Basic Metabolic Panel: Recent Labs  Lab 04/06/23 1136 04/07/23 0600 04/08/23 0607  NA 133* 135 132*  K 3.9 3.5 3.9  CL 101 102 102  CO2 24 24 23   GLUCOSE 125* 108* 115*  BUN 10 10 11   CREATININE 0.96 0.84 0.94  CALCIUM 8.7* 8.4* 8.5*  MG  --   --  2.1  PHOS  --   --  3.3    Studies: No results found.  Scheduled Meds:  aspirin EC  81 mg Oral Daily   clopidogrel  75 mg Oral Daily   vitamin B-12  1,000 mcg Oral Daily   donepezil  5 mg Oral QHS   enoxaparin (LOVENOX) injection  40 mg Subcutaneous Q24H   gabapentin  100 mg Oral BID   levothyroxine  50 mcg Oral QAC breakfast   melatonin  5 mg Oral QHS   nitrofurantoin (macrocrystal-monohydrate)  100 mg Oral QHS   pantoprazole  40 mg Oral Daily   polyethylene glycol  17 g Oral BID   QUEtiapine  25 mg Oral QPM   simvastatin  20 mg Oral q1800   traZODone  50 mg Oral QHS   Continuous Infusions: PRN Meds: acetaminophen **OR** acetaminophen (TYLENOL) oral liquid 160 mg/5 mL **OR** acetaminophen, bisacodyl, bisacodyl  Time spent: 35 minutes  Author: Gillis Santa. MD [facility] 04/11/2023 3:43 PM  To reach On-call, see care teams to locate the attending and reach out to them via [email or web address]. If 7PM-7AM, please contact night-coverage If you still have difficulty reaching the attending provider, please page the [facility] (Director on Call) for [facility] on amion for assistance.

## 2023-04-11 NOTE — Progress Notes (Signed)
       CROSS COVER NOTE  NAME: Sarah Freeman MRN: [id number] DOB : [birth year] ATTENDING PHYSICIAN: Gillis Santa, MD    Date of Service   04/11/2023   HPI/Events of Note   Report/Request  Message received from RN "Pt is agitated and kicking because she wants to go to the beauty shop. She is alert to self only. Refused Seroquel. I have tried redirecting without success. "  0319 "Update: Haldol was ineffective and pt screaming and yelling scratching staff. Pt's daughter is on her way to sit with pt "   Interventions   Assessment/Plan:  2mg  IV Haldol  0.5 mg Ativan       To reach the provider On-Call:   7AM- 7PM see care teams to locate the attending and reach out to them via [email or web address]. Password: [email or web address] 7PM-7AM contact night-coverage If you still have difficulty reaching the appropriate provider, please page the [facility] (Director on Call) for [facility] on amion for assistance  This document was prepared using Conservation officer, historic buildings and may include unintentional dictation errors.  Bishop Limbo DNP, MBA, FNP-BC, PMHNP-BC Nurse Practitioner [facility] Pager [phone]

## 2023-04-11 NOTE — Progress Notes (Signed)
Pt restless,agitated, fighting and kicking staff. Refused Seroquel and unable to redirect. Katy Foust,NP notified and order received for 2mg  Haldol IM. Haldol given however was ineffective. Pt scratching and hitting staff, screaming and yelling. Writer notified [name] Snow,Pt's daughter and NP.

## 2023-04-11 NOTE — Plan of Care (Signed)
Summary of stroke workup  MRI brain MRA H&N  1. Small acute infarct in the right corona radiata. 2. Extensive chronic small vessel ischemic disease. 3. Small chronic right frontal cortical infarcts. 4. No large vessel occlusion. 5. Intracranial atherosclerosis most notably involving the PCAs. No significant proximal stenosis in the anterior intracranial circulation. 6. Motion degraded neck MRA as detailed above, including limited assessment of the carotid bifurcations and left vertebral artery origin. No suspected flow limiting cervical ICA stenosis, however carotid doppler ultrasound or CTA could be performed for further evaluation if clinically warranted  TTE no intracardiac clot.  Stroke Labs     Component Value Date/Time   CHOL 204 (H) 04/06/2023 1135   CHOL 223 (H) 01/04/2023 0000   CHOL 223 (H) 11/03/2016 0904   TRIG 95 04/06/2023 1135   TRIG 180 (H) 11/03/2016 0904   HDL 53 04/06/2023 1135   HDL 48 01/04/2023 0000   CHOLHDL 3.8 04/06/2023 1135   VLDL 19 04/06/2023 1135   VLDL 36 (H) 11/03/2016 0904   LDLCALC 132 (H) 04/06/2023 1135   LDLCALC 146 (H) 01/04/2023 0000   LABVLDL 29 01/04/2023 0000    Lab Results  Component Value Date/Time   HGBA1C 6.0 (H) 04/06/2023 11:36 AM   Etiology of stroke is favored to be small vessel. Stroke workup is completed.  Final recommendations - ASA 81mg  daily + plavix 75mg  daily x90 days f/b ASA 81mg  daily monotherapy after that - Atorvastatin 80mg  daily - Ambulatory referral to neurology at discharge  Neurology to sign off, please re-engage if additional neurologic concerns arise.  Bing Neighbors, MD [facility] [phone]  If 7pm- 7am, please page neurology on call as listed in AMION.

## 2023-04-11 NOTE — Progress Notes (Signed)
Physical Therapy Treatment Patient Details Name: Sarah Freeman MRN: [id number] DOB: [birth year] Today's Date: 04/11/2023   History of Present Illness Lyllie Bilbro is a 89 yo F who comes [facility] after acute onset insidious left hemi motor weakness while drinking coffee at breakfast.  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    PT Comments    Progressive increase in L lateral lean and L inattention this date (?fatigue?), but continues to mobilize with consistent min assist. Constant manual facilitation for R ant/lateral weight shift with all standing activities, assist level increasing with fatigue and divided attention.    Recommendations for follow up therapy are one component of a multi-disciplinary discharge planning process, led by the attending physician.  Recommendations may be updated based on patient status, additional functional criteria and insurance authorization.  Follow Up Recommendations  Can patient physically be transported by private vehicle: Yes    Assistance Recommended at Discharge Frequent or constant Supervision/Assistance  Patient can return home with the following A little help with walking and/or transfers;A little help with bathing/dressing/bathroom;Assistance with cooking/housework;Assist for transportation;Help with stairs or ramp for entrance   Equipment Recommendations  Rolling walker (2 wheels)    Recommendations for Other Services       Precautions / Restrictions Precautions Precautions: Fall Precaution Comments: L inattention Restrictions Weight Bearing Restrictions: No     Mobility  Bed Mobility Overal bed mobility: Needs Assistance Bed Mobility: Supine to Sit     Supine to sit: Min assist     General bed mobility comments: min assist for truncal elevation (patient quick to reach for therapist hand for assistance)    Transfers Overall transfer level: Needs assistance Equipment used: Rolling walker (2  wheels) Transfers: Sit to/from Stand Sit to Stand: Min assist           General transfer comment: step by step cuing to sequence movement transitions    Ambulation/Gait Ambulation/Gait assistance: Min assist Gait Distance (Feet): 200 Feet           General Gait Details: reciprocal stepping pattern with inconsistent L foot step height/length and overall clearance.  Requires constant min manual faciltiation for R ant/lateral weight shift to fully unweight/advance L LE.  Progressive fatigue with gait distance; progressive L lateral lean as result.  Visual inattention to L, frequent verbal cuing for awareness of L visual field/environment   Stairs             Wheelchair Mobility    Modified Rankin (Stroke Patients Only)       Balance Overall balance assessment: Needs assistance Sitting-balance support: No upper extremity supported, Feet supported Sitting balance-Leahy Scale: Fair Sitting balance - Comments: L lateral lean with dynamic activities, min assist for correction with dynamic activities (and divided attention)     Standing balance-Leahy Scale: Poor                              Cognition Arousal/Alertness: Awake/alert Behavior During Therapy: Impulsive (L inattention) Overall Cognitive Status: Impaired/Different from baseline                                          Exercises Other Exercises Other Exercises: Unsupported sitting edge of bed, min assist for L lateral lean: instructed and assisted in upper body dressing (mod assist) and lower body dressing (mod/max assist) with  hemi-dressing techniques.  Consistent verbal cuing for awareness of L UE > LE position and purposeful use with functional activities.  Educated in use of RW with lower body dressing (when pulling pants over hips), min assist for standing balacne, max cuing/hand-over-hand to maintain unilateral UE on RW for balance/safety with activity.    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 PT Goal Formulation: With patient Time For Goal Achievement: 04/20/23 Potential to Achieve Goals: Good Progress towards PT goals: Progressing toward goals    Frequency    Min 3X/week      PT Plan Current plan remains appropriate    Co-evaluation              AM-PAC PT "6 Clicks" Mobility   Outcome Measure  Help needed turning from your back to your side while in a flat bed without using bedrails?: None Help needed moving from lying on your back to sitting on the side of a flat bed without using bedrails?: A Little Help needed moving to and from a bed to a chair (including a wheelchair)?: A Little Help needed standing up from a chair using your arms (e.g., wheelchair or bedside chair)?: A Little Help needed to walk in hospital room?: A Little Help needed climbing 3-5 steps with a railing? : A Lot 6 Click Score: 18    End of Session Equipment Utilized During Treatment: Gait belt Activity Tolerance: Patient tolerated treatment well Patient left: in chair;with call bell/phone within reach;with chair alarm set;with family/visitor present Nurse Communication: Mobility status PT Visit Diagnosis: Difficulty in walking, not elsewhere classified (R26.2);Unsteadiness on feet (R26.81);Other abnormalities of gait and mobility (R26.89)     Time: 1610-9604 PT Time Calculation (min) (ACUTE ONLY): 28 min  Charges:  $Gait Training: 8-22 mins $Therapeutic Activity: 8-22 mins                     Mariza Bourget H. Manson Passey, PT, DPT, NCS 04/11/23, 5:32 PM [phone]

## 2023-04-12 DIAGNOSIS — I639 Cerebral infarction, unspecified: Secondary | ICD-10-CM | POA: Diagnosis not present

## 2023-04-12 LAB — CBC
HCT: 39.1 % (ref 36.0–46.0)
Hemoglobin: 13 g/dL (ref 12.0–15.0)
MCH: 31.1 pg (ref 26.0–34.0)
MCHC: 33.2 g/dL (ref 30.0–36.0)
MCV: 93.5 fL (ref 80.0–100.0)
Platelets: 383 10*3/uL (ref 150–400)
RBC: 4.18 MIL/uL (ref 3.87–5.11)
RDW: 12.8 % (ref 11.5–15.5)
WBC: 11.3 10*3/uL — ABNORMAL HIGH (ref 4.0–10.5)
nRBC: 0 % (ref 0.0–0.2)

## 2023-04-12 LAB — CREATININE, SERUM
Creatinine, Ser: 0.94 mg/dL (ref 0.44–1.00)
GFR, Estimated: 58 mL/min — ABNORMAL LOW (ref >60)

## 2023-04-12 LAB — GLUCOSE, CAPILLARY: Glucose-Capillary: 165 mg/dL — ABNORMAL HIGH (ref 70–99)

## 2023-04-12 MED ORDER — QUETIAPINE FUMARATE 25 MG PO TABS
12.5000 mg | ORAL_TABLET | Freq: Every evening | ORAL | Status: DC
  Administered 2023-04-13 – 2023-04-17 (×5): 12.5 mg via ORAL
  Filled 2023-04-12 (×5): qty 1

## 2023-04-12 MED ORDER — TRAZODONE HCL 50 MG PO TABS
50.0000 mg | ORAL_TABLET | Freq: Every evening | ORAL | Status: DC | PRN
  Administered 2023-04-13: 50 mg via ORAL
  Filled 2023-04-12: qty 1

## 2023-04-12 NOTE — Plan of Care (Signed)

## 2023-04-12 NOTE — Progress Notes (Signed)
PT Cancellation Note  Patient Details Name: Sarah Freeman MRN: [id number] DOB: [birth year]   Cancelled Treatment:    Reason Eval/Treat Not Completed: Fatigue/lethargy limiting ability to participate (Treatment session attempted. Patient being assisted back to bed by CNA upon arrival to room.  Patient voices fatigue and requests therapist allow rest. Will re-attempt at later time/date as medically appropriate and available.)  Vanessa Kampf H. Manson Passey, PT, DPT, NCS 04/12/23, 1:22 PM [phone]

## 2023-04-12 NOTE — Progress Notes (Signed)
[facility] Progress Note  Patient: Sarah Freeman    ZOX:[id number]  DOA: 04/06/2023     Date of Service: the patient was seen and examined on 04/12/2023  Chief Complaint  Patient presents with   Weakness   Code Stroke   Brief hospital course: Sarah Freeman is a 89 y.o. female with medical history significant of dementia, prior CVA in 2017, hypertension, hyperlipidemia, bilateral carotid artery stenosis, hypothyroidism, stage III CKD, GERD, aortic atherosclerosis, who presents to the ED due to left leg weakness.  History was obtained from both patient and her family at bedside.  Around 9:30 AM she was having difficulty getting out of the chair and felt increasing weakness in the left lower extremity. ED workup: BP 142/86, HR 74. O2 sat 100% on RA. afebrile Temp 98.2.  WBC of 10.6, sodium 133, glucose 125, creatinine 0.96 with GFR 56.  CT head: no evidence of acute intracranial abnormality.  Neurology consulted as part of code stroke. [facility] contacted for admission.   Assessment and Plan:  # Acute CVA Patient presented with left lower extremity weakness, which has been resolved.  No neurological deficit at this time. Previous history of CVA in 2017 with similar symptoms that was treated with DAPT for several years. Currently only on Plavix.  MRI brain, MRA H/N: Small acute infarct in the right corona radiata. Extensive chronic small vessel ischemic disease. Small chronic right frontal cortical infarcts. No large vessel occlusion. Intracranial atherosclerosis most notably involving the PCAs. No significant proximal stenosis in the anterior intracranial circulation.  Motion degraded neck MRA as detailed above, including limited assessment of the carotid bifurcations and left vertebral artery origin. No suspected flow limiting cervical ICA stenosis, however carotid doppler ultrasound or CTA could be performed for further evaluation if clinically warranted. LDL 132, hemoglobin A1c  6.0 Patient was seen by neurology, recommended dual antiplatelet therapy for 21 days followed by Plavix only. S/p ASA 325 mg x 1 dose, Started aspirin 81 mg p.o. daily, Plavix 75 mg p.o. daily home dose. Started simvastatin 20 mg p.o. daily low-dose as patient has documented allergy to statin causing leg cramps.  We can gradually advance the dose as per tolerance. Allow for permissive HTN (systolic < 220 and diastolic < 120)  Continue to monitor on telemetry TTE LVEF 60 to 65%, grade 1 diastolic dysfunction, negative PFO PT/OT eval done, recommend SNF placement [facility] consulted for placement.  # Sundowning and agitation 5/13, last night patient was very agitated, patient was given 1 dose of Haldol.  Patient was sleepy in the morning. Started Seroquel 25 mg p.o. nightly and trazodone 50 mg p.o. nightly for insomnia  # Memory loss Likely vascular in nature given extensive small vessel disease on previous MRIs.   # HTN (hypertension) - Hold home antihypertensives to allow for permissive hypertension   # Chronic kidney disease, stage 3  Renal function currently at baseline.  Will recheck tomorrow morning after receiving contrast. -BMP in the a.m.   # Chronic UTI - Continue chronic Macrobid   # Hypothyroidism - Continue home levothyroxine  # Constipation started MiraLAX twice daily, Dulcolax as needed.  # Vitamin B12 level 230, goal >400, started vitamin B12 1000 mcg p.o. daily, repeat vitamin B12 level after 3 to 6 months.    Body mass index is 35.64 kg/m.  Interventions:     Diet: Regular diet DVT Prophylaxis: Subcutaneous Lovenox   Advance goals of care discussion: DNR  Family Communication: family was present at bedside,  at the time of interview.  The pt provided permission to discuss medical plan with the family. Opportunity was given to ask question and all questions were answered satisfactorily.   Disposition:  Pt is from Home, admitted with Acute CVA, still has  weakness due to debility, PT and OT recommended SNF placement, which precludes a safe discharge. Discharge to SNF, when bed will be available.  Firefighter received.   SNF will not except the patient for 3 days because she received Haldol as per TOC.   Subjective: No significant events overnight, patient was on the recliner, stated that she just woke up from the sleep.  Patient's daughter at the bedside, was not aware of any sundowning episode last night. Patient seems to be resting comfortably, denies any active issues.  Patient was concerned when she will be going to rehab, patient was advised to keep an touch with Child psychotherapist and case Production designer, theatre/television/film.  Most likely discharge on Thursday.   Physical Exam: General: NAD, sitting comfortably Appear in no distress, affect appropriate Eyes: Closed, sleepy ENT: Oral Mucosa Clear, moist  Neck: no JVD,  Cardiovascular: S1 and S2 Present, no Murmur,  Respiratory: good respiratory effort, Bilateral Air entry equal and Decreased, no Crackles, no wheezes Abdomen: Bowel Sound present, Soft and no tenderness,  Skin: no rashes Extremities: no Pedal edema, no calf tenderness Neurologic: without any new focal findings Gait not checked due to patient safety concerns  Vitals:   04/12/23 0044 04/12/23 0416 04/12/23 0814 04/12/23 1200  BP: 122/66 136/85 121/67 102/70  Pulse: 75 77 66 79  Resp: 18 18 18 16   Temp: (!) 97.5 F (36.4 C) 97.6 F (36.4 C) 98 F (36.7 C) 98 F (36.7 C)  TempSrc: Oral Oral Oral   SpO2: 98% 99% 98% 99%  Weight:      Height:        Intake/Output Summary (Last 24 hours) at 04/12/2023 1351 Last data filed at 04/12/2023 1031 Gross per 24 hour  Intake 600 ml  Output --  Net 600 ml   Filed Weights   04/06/23 1541  Weight: 72.1 kg    Data Reviewed: I have personally reviewed and interpreted daily labs, tele strips, imagings as discussed above. I reviewed all nursing notes, pharmacy notes, vitals, pertinent old records I  have discussed plan of care as described above with RN and patient/family.  CBC: Recent Labs  Lab 04/06/23 1136 04/08/23 0607 04/12/23 0641  WBC 10.6* 9.9 11.3*  NEUTROABS 6.6  --   --   HGB 13.7 12.9 13.0  HCT 41.5 38.3 39.1  MCV 94.1 92.7 93.5  PLT 355 346 383   Basic Metabolic Panel: Recent Labs  Lab 04/06/23 1136 04/07/23 0600 04/08/23 0607 04/12/23 0641  NA 133* 135 132*  --   K 3.9 3.5 3.9  --   CL 101 102 102  --   CO2 24 24 23   --   GLUCOSE 125* 108* 115*  --   BUN 10 10 11   --   CREATININE 0.96 0.84 0.94 0.94  CALCIUM 8.7* 8.4* 8.5*  --   MG  --   --  2.1  --   PHOS  --   --  3.3  --     Studies: No results found.  Scheduled Meds:  aspirin EC  81 mg Oral Daily   clopidogrel  75 mg Oral Daily   vitamin B-12  1,000 mcg Oral Daily   donepezil  5 mg Oral QHS  enoxaparin (LOVENOX) injection  40 mg Subcutaneous Q24H   gabapentin  100 mg Oral BID   levothyroxine  50 mcg Oral QAC breakfast   melatonin  5 mg Oral QHS   nitrofurantoin (macrocrystal-monohydrate)  100 mg Oral QHS   pantoprazole  40 mg Oral Daily   polyethylene glycol  17 g Oral BID   QUEtiapine  25 mg Oral QPM   simvastatin  20 mg Oral q1800   traZODone  50 mg Oral QHS   Continuous Infusions: PRN Meds: acetaminophen **OR** acetaminophen (TYLENOL) oral liquid 160 mg/5 mL **OR** acetaminophen, bisacodyl, bisacodyl  Time spent: 35 minutes  Author: Gillis Santa. MD [facility] 04/12/2023 1:51 PM  To reach On-call, see care teams to locate the attending and reach out to them via [email or web address]. If 7PM-7AM, please contact night-coverage If you still have difficulty reaching the attending provider, please page the [facility] (Director on Call) for [facility] on amion for assistance.

## 2023-04-13 DIAGNOSIS — I639 Cerebral infarction, unspecified: Secondary | ICD-10-CM | POA: Diagnosis not present

## 2023-04-13 NOTE — Care Management Important Message (Signed)
Important Message  Patient Details  Name: Sarah Freeman MRN: [id number] Date of Birth: 1931/07/10   Medicare Important Message Given:  Yes     [name] A Rudolf Blizard 04/13/2023, 1:39 PM

## 2023-04-13 NOTE — Progress Notes (Signed)
[facility] Progress Note  Patient: Sarah Freeman    ZOX:[id number]  DOA: 04/06/2023     Date of Service: the patient was seen and examined on 04/13/2023  Chief Complaint  Patient presents with   Weakness   Code Stroke   Brief hospital course: Sarah Freeman is a 89 y.o. female with medical history significant of dementia, prior CVA in 2017, hypertension, hyperlipidemia, bilateral carotid artery stenosis, hypothyroidism, stage III CKD, GERD, aortic atherosclerosis, who presents to the ED due to left leg weakness.  History was obtained from both patient and her family at bedside.  Around 9:30 AM she was having difficulty getting out of the chair and felt increasing weakness in the left lower extremity. ED workup: BP 142/86, HR 74. O2 sat 100% on RA. afebrile Temp 98.2.  WBC of 10.6, sodium 133, glucose 125, creatinine 0.96 with GFR 56.  CT head: no evidence of acute intracranial abnormality.  Neurology consulted as part of code stroke. [facility] contacted for admission.   Assessment and Plan:  # Acute CVA Patient presented with left lower extremity weakness, which has been resolved.  No neurological deficit at this time. Previous history of CVA in 2017 with similar symptoms that was treated with DAPT for several years. Currently only on Plavix.  MRI brain, MRA H/N: Small acute infarct in the right corona radiata. Extensive chronic small vessel ischemic disease. Small chronic right frontal cortical infarcts. No large vessel occlusion. Intracranial atherosclerosis most notably involving the PCAs. No significant proximal stenosis in the anterior intracranial circulation.  Motion degraded neck MRA as detailed above, including limited assessment of the carotid bifurcations and left vertebral artery origin. No suspected flow limiting cervical ICA stenosis, however carotid doppler ultrasound or CTA could be performed for further evaluation if clinically warranted. LDL 132, hemoglobin A1c  6.0 Patient was seen by neurology, recommended dual antiplatelet therapy for 21 days followed by Plavix only. S/p ASA 325 mg x 1 dose, Started aspirin 81 mg p.o. daily, Plavix 75 mg p.o. daily home dose. Started simvastatin 20 mg p.o. daily low-dose as patient has documented allergy to statin causing leg cramps.  We can gradually advance the dose as per tolerance. Allow for permissive HTN (systolic < 220 and diastolic < 120)  Continue to monitor on telemetry TTE LVEF 60 to 65%, grade 1 diastolic dysfunction, negative PFO PT/OT eval done, recommend SNF placement [facility] consulted for placement.  # Sundowning and agitation 5/13, last night patient was very agitated, patient was given 1 dose of Haldol.  Patient was sleepy in the morning. Started Seroquel 12.5 mg p.o. nightly and trazodone 50 mg p.o. qhs prn for insomnia  # Memory loss Likely vascular in nature given extensive small vessel disease on previous MRIs.   # HTN (hypertension) - Hold home antihypertensives to allow for permissive hypertension   # Chronic kidney disease, stage 3  Renal function currently at baseline.  Will recheck tomorrow morning after receiving contrast. -BMP in the a.m.   # Chronic UTI - Continue chronic Macrobid   # Hypothyroidism - Continue home levothyroxine  # Constipation started MiraLAX twice daily, Dulcolax as needed.  # Vitamin B12 level 230, goal >400, started vitamin B12 1000 mcg p.o. daily, repeat vitamin B12 level after 3 to 6 months.    Body mass index is 35.64 kg/m.  Interventions:     Diet: Regular diet DVT Prophylaxis: Subcutaneous Lovenox   Advance goals of care discussion: DNR  Family Communication: family was present at  bedside, at the time of interview.  The pt provided permission to discuss medical plan with the family. Opportunity was given to ask question and all questions were answered satisfactorily.   Disposition:  Pt is from Home, admitted with Acute CVA, still has  weakness due to debility, PT and OT recommended SNF placement, which precludes a safe discharge. Discharge to SNF, when bed will be available.  Firefighter received.   SNF will not except the patient for 3 days because she received Haldol as per TOC.   Subjective: No significant events overnight, patient was working with physical therapy, sitting at the edge of the bed, awake and alert.  Denies any complaints. Family was at bedside, all question and concerns answered.  Physical Exam: General: NAD, sitting comfortably Appear in no distress, affect appropriate Eyes: Closed, sleepy ENT: Oral Mucosa Clear, moist  Neck: no JVD,  Cardiovascular: S1 and S2 Present, no Murmur,  Respiratory: good respiratory effort, Bilateral Air entry equal and Decreased, no Crackles, no wheezes Abdomen: Bowel Sound present, Soft and no tenderness,  Skin: no rashes Extremities: no Pedal edema, no calf tenderness Neurologic: without any new focal findings Gait not checked due to patient safety concerns  Vitals:   04/13/23 0201 04/13/23 0601 04/13/23 0822 04/13/23 1220  BP: (!) 148/77 119/63 (!) 101/59 126/70  Pulse: 74 75 81 91  Resp: 16 16 18 16   Temp: 98.6 F (37 C) 98 F (36.7 C) 98 F (36.7 C) 98.4 F (36.9 C)  TempSrc:      SpO2: 97% 96% 97% 98%  Weight:      Height:        Intake/Output Summary (Last 24 hours) at 04/13/2023 1355 Last data filed at 04/13/2023 1040 Gross per 24 hour  Intake 720 ml  Output --  Net 720 ml   Filed Weights   04/06/23 1541  Weight: 72.1 kg    Data Reviewed: I have personally reviewed and interpreted daily labs, tele strips, imagings as discussed above. I reviewed all nursing notes, pharmacy notes, vitals, pertinent old records I have discussed plan of care as described above with RN and patient/family.  CBC: Recent Labs  Lab 04/08/23 0607 04/12/23 0641  WBC 9.9 11.3*  HGB 12.9 13.0  HCT 38.3 39.1  MCV 92.7 93.5  PLT 346 383   Basic Metabolic  Panel: Recent Labs  Lab 04/07/23 0600 04/08/23 0607 04/12/23 0641  NA 135 132*  --   K 3.5 3.9  --   CL 102 102  --   CO2 24 23  --   GLUCOSE 108* 115*  --   BUN 10 11  --   CREATININE 0.84 0.94 0.94  CALCIUM 8.4* 8.5*  --   MG  --  2.1  --   PHOS  --  3.3  --     Studies: No results found.  Scheduled Meds:  aspirin EC  81 mg Oral Daily   clopidogrel  75 mg Oral Daily   vitamin B-12  1,000 mcg Oral Daily   donepezil  5 mg Oral QHS   enoxaparin (LOVENOX) injection  40 mg Subcutaneous Q24H   gabapentin  100 mg Oral BID   levothyroxine  50 mcg Oral QAC breakfast   melatonin  5 mg Oral QHS   nitrofurantoin (macrocrystal-monohydrate)  100 mg Oral QHS   pantoprazole  40 mg Oral Daily   polyethylene glycol  17 g Oral BID   QUEtiapine  12.5 mg Oral QPM  simvastatin  20 mg Oral q1800   Continuous Infusions: PRN Meds: acetaminophen **OR** acetaminophen (TYLENOL) oral liquid 160 mg/5 mL **OR** acetaminophen, bisacodyl, bisacodyl, traZODone  Time spent: 35 minutes  Author: Gillis Santa. MD [facility] 04/13/2023 1:55 PM  To reach On-call, see care teams to locate the attending and reach out to them via [email or web address]. If 7PM-7AM, please contact night-coverage If you still have difficulty reaching the attending provider, please page the [facility] (Director on Call) for [facility] on amion for assistance.

## 2023-04-13 NOTE — Progress Notes (Signed)
Physical Therapy Treatment Patient Details Name: Sarah Freeman MRN: [id number] DOB: Dec 10, 1930 Today's Date: 04/13/2023   History of Present Illness Beyounce Menges is a 89 yo F who comes to [facility] after acute onset insidious left hemi motor weakness while drinking coffee at breakfast.  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    PT Comments    Pt was asleep in long sitting upon arrival. Supportive daughter and son were in room and present throughout. Pt easily awakes and is agreeable to session. Increased time required to perform all desired task however pt was able to perform with minimal assistance. L side deficits noted. Youth RW + L walker splint used during ambulation in hallway ~ 70 ft. Pt is limited by fatigue. Educated pt and family about importance of using L side movements and strengthening to assist pt to PLOF. Encouraged family to sit and talk to pt on L side to decrease L side neglect. Overall pt tolerated session well and is progressing towards PT goals. Highly recommend continued skilled PT at DC to maximize independence while decreasing caregiver burden.     Recommendations for follow up therapy are one component of a multi-disciplinary discharge planning process, led by the attending physician.  Recommendations may be updated based on patient status, additional functional criteria and insurance authorization.     Assistance Recommended at Discharge Frequent or constant Supervision/Assistance  Patient can return home with the following A little help with walking and/or transfers;A little help with bathing/dressing/bathroom;Assistance with cooking/housework;Assist for transportation;Help with stairs or ramp for entrance   Equipment Recommendations  Rolling walker (2 wheels) (youth RW)       Precautions / Restrictions Precautions Precautions: Fall Precaution Comments: L inattention Restrictions Weight Bearing Restrictions: No     Mobility  Bed  Mobility Overal bed mobility: Needs Assistance Bed Mobility: Supine to Sit  Supine to sit: Min assist, Mod assist Sit to supine: Min assist General bed mobility comments: Increased time to perform + vcs for tyechniqwue and sequencing improvements    Transfers Overall transfer level: Needs assistance Equipment used: Rolling walker (2 wheels) Transfers: Sit to/from Stand Sit to Stand: Min assist  General transfer comment: Pt was able to stand from EOB to youth RW with min    Ambulation/Gait Ambulation/Gait assistance: Min assist Gait Distance (Feet): 70 Feet Assistive device: Rolling walker (2 wheels) Gait Pattern/deviations: Trunk flexed, Decreased stance time - right, Decreased step length - left, Step-through pattern Gait velocity: decreased  General Gait Details: Pt was able to ambulate ~ 70 ft in halway with youth RW + LUE walker splint. pt has trouble maintaiuning grip on RW with LUE but with splint was able to maintain. Distance limited by fatigue. Vcs throughout for improved gait posture. poor pleth reading during gait on rm air. requested rn staff reassess with dynamap with future ambulation.    Balance Overall balance assessment: Needs assistance Sitting-balance support: No upper extremity supported, Feet supported Sitting balance-Leahy Scale: Fair  Standing balance support: During functional activity, Reliant on assistive device for balance, No upper extremity supported Standing balance-Leahy Scale: Poor Standing balance comment: pt is at high fall risk without BUE support. reliant on UEs to maintain dynamic standing balance activities. Not at her baseline.    Cognition Arousal/Alertness: Awake/alert Behavior During Therapy: WFL for tasks assessed/performed Overall Cognitive Status: Impaired/Different from baseline Area of Impairment: Awareness, Safety/judgement  Safety/Judgement: Decreased awareness of safety, Decreased awareness of deficits  General Comments: Pt was  asleep upon arrival but easily  awakes and is agreeable to OOB activity. Pt's daughter anmd sopn were present. " She is doing alot better now that the medicationhas worn off."               Pertinent Vitals/Pain Pain Assessment Pain Assessment: No/denies pain     PT Goals (current goals can now be found in the care plan section) Acute Rehab PT Goals Patient Stated Goal: rehab then home Progress towards PT goals: Progressing toward goals    Frequency    Min 3X/week      PT Plan Current plan remains appropriate       AM-PAC PT "6 Clicks" Mobility   Outcome Measure  Help needed turning from your back to your side while in a flat bed without using bedrails?: A Little Help needed moving from lying on your back to sitting on the side of a flat bed without using bedrails?: A Little Help needed moving to and from a bed to a chair (including a wheelchair)?: A Little Help needed standing up from a chair using your arms (e.g., wheelchair or bedside chair)?: A Little Help needed to walk in hospital room?: A Little Help needed climbing 3-5 steps with a railing? : A Lot 6 Click Score: 17    End of Session Equipment Utilized During Treatment: Gait belt Activity Tolerance: Patient tolerated treatment well Patient left: in bed;with call bell/phone within reach;with bed alarm set;with family/visitor present Nurse Communication: Mobility status PT Visit Diagnosis: Difficulty in walking, not elsewhere classified (R26.2);Unsteadiness on feet (R26.81);Other abnormalities of gait and mobility (R26.89)     Time: 1040-1106 PT Time Calculation (min) (ACUTE ONLY): 26 min  Charges:  $Gait Training: 8-22 mins $Neuromuscular Re-education: 8-22 mins                    Jetta Lout PTA 04/13/23, 11:47 AM

## 2023-04-13 NOTE — Progress Notes (Signed)
Occupational Therapy Treatment Patient Details Name: Sarah Freeman MRN: [id number] DOB: Nov 21, 1931 Today's Date: 04/13/2023   History of present illness Kalinda Westrick is a 89 yo F who comes to [facility] after acute onset insidious left hemi motor weakness while drinking coffee at breakfast.  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   OT comments  Pt received in bed with daughter/son in room, agreeable to OT tx session. Reports working with PT and in overall good spirits. See below for ADL status. Overall, pt continues to present with decreased dynamic standing balance, impaired GMC/FMC of L hemibody, decreased safety awareness/judgement during functional tasks and poor task initiation/sequencing throughout toileting. Requires overall min guard- min a for transfer to commode with RW, poor mgmt noted of LLE with decreased attn to L. LB clothing mgmt requires tactile and vcs continually for overall performance, increased time and processing required. Able to perform bed mobility with overall mod assist from EOB > supine. Delays in force production, hemibody weakness and falls risk cause limitations in overall functional mobility and ADL performance.    Recommendations for follow up therapy are one component of a multi-disciplinary discharge planning process, led by the attending physician.  Recommendations may be updated based on patient status, additional functional criteria and insurance authorization.    Assistance Recommended at Discharge Frequent or constant Supervision/Assistance  Patient can return home with the following  A little help with walking and/or transfers;A little help with bathing/dressing/bathroom;Assistance with cooking/housework;Assist for transportation;Help with stairs or ramp for entrance;Direct supervision/assist for financial management;Direct supervision/assist for medications management   Equipment Recommendations  Other (comment) (Defer to next level of care)        Precautions / Restrictions Precautions Precautions: Fall Precaution Comments: L inattention Restrictions Weight Bearing Restrictions: No       Mobility Bed Mobility Overal bed mobility: Needs Assistance Bed Mobility: Supine to Sit     Supine to sit: Min assist, Mod assist Sit to supine: Mod assist   General bed mobility comments: Increased time to perform + vcs for LLE mgmt, bridges at hips inefficiently, impaired coordination of hemibody    Transfers Overall transfer level: Needs assistance Equipment used: Rolling walker (2 wheels) Transfers: Sit to/from Stand Sit to Stand: Min assist           General transfer comment: Given cues and placement of L hand on RW, pt able to stand with overall min a, demos poor safety awareness and judgement with RW mgmt     Balance Overall balance assessment: Needs assistance Sitting-balance support: No upper extremity supported, Feet supported Sitting balance-Leahy Scale: Fair     Standing balance support: During functional activity, Reliant on assistive device for balance, No upper extremity supported Standing balance-Leahy Scale: Poor Standing balance comment: High fall risk, forward lean at trunk, requires mod vcs for upright posture and poor dynamic standing balance. Heavy use of BUE on RW or sink for steadiness, pt fearful of falls per subjective report                           ADL either performed or assessed with clinical judgement   ADL Overall ADL's : Needs assistance/impaired     Grooming: Standing;Wash/dry hands;Minimal assistance;Cueing for safety Grooming Details (indicate cue type and reason): Tactile and vcs to incorporate LUE during bimanual tasks, increased tactile vcs for LLE [employer]  Transfer: Min guard;Minimal assistance;Rolling walker (2 wheels);Cueing for sequencing Toilet Transfer Details (indicate cue type and reason): Cues for safety and incorporation of L  hemibody Toileting- Clothing Manipulation and Hygiene: Minimal assistance;Sit to/from stand;Cueing for safety;Cueing for sequencing Toileting - Clothing Manipulation Details (indicate cue type and reason): tactile and vcs for L inattention/poor incorporation of L hand for pulling up briefs, cues to maintain upright posture and decreased task initiation     Functional mobility during ADLs: Minimal assistance;Cueing for safety;Rolling walker (2 wheels) General ADL Comments: Decreased insight into deficits, poor managment of LLE during functional transfers, limited spatial awareness towards L, difficulties with visual perception. Impulsivity with transfers; attempts to sit before fully turning to commode, slow task completion d/t initiation and task sequencing    Extremity/Trunk Assessment Upper Extremity Assessment Upper Extremity Assessment:  (L inattention + generalized weakness) LUE Deficits / Details: 3+/5 gross strength LUE Coordination: decreased fine motor              Frequency  Min 2X/week        Progress Toward Goals  OT Goals(current goals can now be found in the care plan section)  Progress towards OT goals: Progressing toward goals  Acute Rehab OT Goals Patient Stated Goal: To go home OT Goal Formulation: With patient Time For Goal Achievement: 04/21/23 Potential to Achieve Goals: Fair  Plan Discharge plan remains appropriate       AM-PAC OT "6 Clicks" Daily Activity     Outcome Measure   Help from another person eating meals?: A Little Help from another person taking care of personal grooming?: A Little Help from another person toileting, which includes using toliet, bedpan, or urinal?: A Little Help from another person bathing (including washing, rinsing, drying)?: A Little Help from another person to put on and taking off regular upper body clothing?: A Little Help from another person to put on and taking off regular lower body clothing?: A Lot 6 Click  Score: 17    End of Session Equipment Utilized During Treatment: Rolling walker (2 wheels)  OT Visit Diagnosis: Unsteadiness on feet (R26.81);Repeated falls (R29.6);Muscle weakness (generalized) (M62.81)   Activity Tolerance Patient tolerated treatment well   Patient Left in bed;with family/visitor present;with bed alarm set;with call bell/phone within reach   Nurse Communication          Time: 1610-9604 OT Time Calculation (min): 36 min  Charges: OT General Charges $OT Visit: 1 Visit OT Treatments $Self Care/Home Management : 23-37 mins  Mazell Aylesworth L. Mathew Storck, OTR/L  04/13/23, 11:42 AM

## 2023-04-14 DIAGNOSIS — I639 Cerebral infarction, unspecified: Secondary | ICD-10-CM | POA: Diagnosis not present

## 2023-04-14 LAB — CREATININE, SERUM
Creatinine, Ser: 0.87 mg/dL (ref 0.44–1.00)
GFR, Estimated: >60 mL/min (ref >60)

## 2023-04-14 NOTE — Progress Notes (Signed)
[facility] Progress Note  Patient: Sarah Freeman    ZOX:[id number]  DOA: 04/06/2023     Date of Service: the patient was seen and examined on 04/14/2023  Chief Complaint  Patient presents with   Weakness   Code Stroke   Brief hospital course: MAGALLY DEMINT is a 89 y.o. female with medical history significant of dementia, prior CVA in 2017, hypertension, hyperlipidemia, bilateral carotid artery stenosis, hypothyroidism, stage III CKD, GERD, aortic atherosclerosis, who presents to the ED due to left leg weakness.  History was obtained from both patient and her family at bedside.  Around 9:30 AM she was having difficulty getting out of the chair and felt increasing weakness in the left lower extremity. ED workup: BP 142/86, HR 74. O2 sat 100% on RA. afebrile Temp 98.2.  WBC of 10.6, sodium 133, glucose 125, creatinine 0.96 with GFR 56.  CT head: no evidence of acute intracranial abnormality.  Neurology consulted as part of code stroke. [facility] contacted for admission.   Assessment and Plan:  # Acute CVA Patient presented with left lower extremity weakness, which has been resolved.  No neurological deficit at this time. Previous history of CVA in 2017 with similar symptoms that was treated with DAPT for several years. Currently only on Plavix.  MRI brain, MRA H/N: Small acute infarct in the right corona radiata. Extensive chronic small vessel ischemic disease. Small chronic right frontal cortical infarcts. No large vessel occlusion. Intracranial atherosclerosis most notably involving the PCAs. No significant proximal stenosis in the anterior intracranial circulation.  Motion degraded neck MRA as detailed above, including limited assessment of the carotid bifurcations and left vertebral artery origin. No suspected flow limiting cervical ICA stenosis, however carotid doppler ultrasound or CTA could be performed for further evaluation if clinically warranted. LDL 132, hemoglobin A1c  6.0 Patient was seen by neurology, recommended dual antiplatelet therapy for 21 days followed by Plavix only. S/p ASA 325 mg x 1 dose, Started aspirin 81 mg p.o. daily, Plavix 75 mg p.o. daily home dose. Started simvastatin 20 mg p.o. daily low-dose as patient has documented allergy to statin causing leg cramps.  We can gradually advance the dose as per tolerance. Allow for permissive HTN (systolic < 220 and diastolic < 120)  Continue to monitor on telemetry TTE LVEF 60 to 65%, grade 1 diastolic dysfunction, negative PFO PT/OT eval done, recommend SNF placement [facility] consulted for placement.  # Sundowning and agitation 5/13, last night patient was very agitated, patient was given 1 dose of Haldol.  Patient was sleepy in the morning. Started Seroquel 12.5 mg p.o. nightly and trazodone 50 mg p.o. qhs prn for insomnia  # Memory loss Likely vascular in nature given extensive small vessel disease on previous MRIs.   # HTN (hypertension) - Hold home antihypertensives to allow for permissive hypertension   # Chronic kidney disease, stage 3  Renal function currently at baseline.  Will recheck tomorrow morning after receiving contrast. -BMP in the a.m.   # Chronic UTI - Continue chronic Macrobid   # Hypothyroidism - Continue home levothyroxine  # Constipation started MiraLAX twice daily, Dulcolax as needed.  # Vitamin B12 level 230, goal >400, started vitamin B12 1000 mcg p.o. daily, repeat vitamin B12 level after 3 to 6 months.    Body mass index is 35.64 kg/m.  Interventions:     Diet: Regular diet DVT Prophylaxis: Subcutaneous Lovenox   Advance goals of care discussion: DNR  Family Communication: family was present at  bedside, at the time of interview.  The pt provided permission to discuss medical plan with the family. Opportunity was given to ask question and all questions were answered satisfactorily.   Disposition:  Pt is from Home, admitted with Acute CVA, still has  weakness due to debility, PT and OT recommended SNF placement, which precludes a safe discharge. Discharge to SNF, when bed will be available.  Firefighter received.   SNF will not except the patient for 3 days because she received Haldol as per TOC. Dc tomorrow am as per TOC   Subjective: No significant events overnight, pt was sitting on the chair comfortably, denied any complaints. Family was at bedside, all question and concerns answered.  Physical Exam: General: NAD, sitting comfortably Appear in no distress, affect appropriate Eyes: Closed, sleepy ENT: Oral Mucosa Clear, moist  Neck: no JVD,  Cardiovascular: S1 and S2 Present, no Murmur,  Respiratory: good respiratory effort, Bilateral Air entry equal and Decreased, no Crackles, no wheezes Abdomen: Bowel Sound present, Soft and no tenderness,  Skin: no rashes Extremities: no Pedal edema, no calf tenderness Neurologic: without any new focal findings Gait not checked due to patient safety concerns  Vitals:   04/13/23 2000 04/14/23 0343 04/14/23 0817 04/14/23 1241  BP: (!) 133/58 137/62 111/61 122/61  Pulse: 88 86 81 (!) 101  Resp: 18 18 (!) 22 18  Temp: 98.2 F (36.8 C) 99 F (37.2 C) 98.5 F (36.9 C) 97.8 F (36.6 C)  TempSrc:      SpO2: 99% 96% 96% 100%  Weight:      Height:        Intake/Output Summary (Last 24 hours) at 04/14/2023 1359 Last data filed at 04/13/2023 1930 Gross per 24 hour  Intake 480 ml  Output --  Net 480 ml   Filed Weights   04/06/23 1541  Weight: 72.1 kg    Data Reviewed: I have personally reviewed and interpreted daily labs, tele strips, imagings as discussed above. I reviewed all nursing notes, pharmacy notes, vitals, pertinent old records I have discussed plan of care as described above with RN and patient/family.  CBC: Recent Labs  Lab 04/08/23 0607 04/12/23 0641  WBC 9.9 11.3*  HGB 12.9 13.0  HCT 38.3 39.1  MCV 92.7 93.5  PLT 346 383   Basic Metabolic Panel: Recent  Labs  Lab 04/08/23 0607 04/12/23 0641 04/14/23 0555  NA 132*  --   --   K 3.9  --   --   CL 102  --   --   CO2 23  --   --   GLUCOSE 115*  --   --   BUN 11  --   --   CREATININE 0.94 0.94 0.87  CALCIUM 8.5*  --   --   MG 2.1  --   --   PHOS 3.3  --   --     Studies: No results found.  Scheduled Meds:  aspirin EC  81 mg Oral Daily   clopidogrel  75 mg Oral Daily   vitamin B-12  1,000 mcg Oral Daily   donepezil  5 mg Oral QHS   enoxaparin (LOVENOX) injection  40 mg Subcutaneous Q24H   gabapentin  100 mg Oral BID   levothyroxine  50 mcg Oral QAC breakfast   melatonin  5 mg Oral QHS   nitrofurantoin (macrocrystal-monohydrate)  100 mg Oral QHS   pantoprazole  40 mg Oral Daily   polyethylene glycol  17 g Oral  BID   QUEtiapine  12.5 mg Oral QPM   simvastatin  20 mg Oral q1800   Continuous Infusions: PRN Meds: acetaminophen **OR** acetaminophen (TYLENOL) oral liquid 160 mg/5 mL **OR** acetaminophen, bisacodyl, bisacodyl, traZODone  Time spent: 35 minutes  Author: Gillis Santa. MD [facility] 04/14/2023 1:59 PM  To reach On-call, see care teams to locate the attending and reach out to them via [email or web address]. If 7PM-7AM, please contact night-coverage If you still have difficulty reaching the attending provider, please page the [place] (Director on Call) for [facility] on amion for assistance.

## 2023-04-14 NOTE — TOC Progression Note (Signed)
Transition of Care [facility]) - Progression Note    Patient Details  Name: Sarah Freeman MRN: [id number] Date of Birth: 1931-11-12  Transition of Care [facility] CM/SW Contact  Allena Katz, LCSW Phone Number: 04/14/2023, 4:02 PM  Clinical Narrative:    Pt to discharge to [facility]. Family notified.         Expected Discharge Plan and Services                                               Social Determinants of Health (SDOH) Interventions SDOH Screenings   Food Insecurity: No Food Insecurity (04/06/2023)  Housing: Low Risk  (04/06/2023)  Transportation Needs: No Transportation Needs (04/06/2023)  Utilities: Not At Risk (04/06/2023)  Alcohol Screen: Low Risk  (02/01/2023)  Depression (PHQ2-9): Low Risk  (02/01/2023)  Financial Resource Strain: Low Risk  (02/01/2023)  Physical Activity: Insufficiently Active (02/01/2023)  Social Connections: Moderately Isolated (02/01/2023)  Stress: No Stress Concern Present (02/01/2023)  Tobacco Use: Low Risk  (04/06/2023)    Readmission Risk Interventions     No data to display

## 2023-04-14 NOTE — Progress Notes (Signed)
Chap circled back with volunteers to help the Pt complete the [employer]. Pt family had invited a Notary from outside to facilitate the signing of the Document.  Adv Dir Completed and both family and Pt were appreciative.   04/14/23 1400  Spiritual Encounters  Type of Visit Follow up  Care provided to: Pt and family  Conversation partners present during encounter Nurse  Referral source Family  Reason for visit Advance directives  OnCall Visit Yes  Interventions  Spiritual Care Interventions Made Decision-making support/facilitation  Intervention Outcomes  Outcomes Reduced fear;Autonomy/agency;Connection to spiritual care  Spiritual Care Plan  Spiritual Care Issues Still Outstanding No further spiritual care needs at this time (see row info)

## 2023-04-14 NOTE — Progress Notes (Signed)
Physical Therapy Treatment Patient Details Name: Sarah Freeman MRN: [id number] DOB: [birth year] Today's Date: 04/14/2023   History of Present Illness Sarah Freeman is a 89 yo F who comes to [facility] after acute onset insidious left hemi motor weakness while drinking coffee at breakfast.  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    PT Comments    Pt was long sitting in bed, asleep, upon arrival. She easily awakes but remains more lethargic/fatigued today verus previous day. She was able to tolerate getting out of bed towards R with increased time +  a lot of vcs for technique and sequencing. Pt stood from EOB to RW + LUE  walker splint with min assist. Constant vcs in standing to erect posture. Pt tends to have flexed posture with shuffling gait kinematics. She only tolerated gait ~ 40 ft total due to increased fatigue/lethargy. At conclusion of session, pt was in recliner with chair alarm in place and family at bedside. DC recs remain appropriate to maximize pt's independence/ safety with all ADLs. Acute PT will continue to follow per current POC.   Recommendations for follow up therapy are one component of a multi-disciplinary discharge planning process, led by the attending physician.  Recommendations may be updated based on patient status, additional functional criteria and insurance authorization.     Assistance Recommended at Discharge Frequent or constant Supervision/Assistance  Patient can return home with the following A little help with walking and/or transfers;A little help with bathing/dressing/bathroom;Assistance with cooking/housework;Assist for transportation;Help with stairs or ramp for entrance   Equipment Recommendations  Other (comment) (Defer to next level of care)       Precautions / Restrictions Precautions Precautions: Fall Precaution Comments: L inattention Restrictions Weight Bearing Restrictions: No     Mobility  Bed Mobility Overal bed mobility:  Needs Assistance Bed Mobility: Supine to Sit Supine to sit: Min assist, Mod assist  General bed mobility comments: Increased time required to exit R side fo bed with vcs for step by step sequencing    Transfers Overall transfer level: Needs assistance Equipment used: Rolling walker (2 wheels) (with LUE walker splint) Transfers: Sit to/from Stand Sit to Stand: Min assist  General transfer comment: Pt was able to stand from EOB to RW with min assist + vcs.    Ambulation/Gait Ambulation/Gait assistance: Min assist Gait Distance (Feet): 40 Feet Assistive device: Rolling walker (2 wheels) (with LUE walker splint) Gait Pattern/deviations: Trunk flexed, Decreased stance time - right, Decreased step length - left, Step-through pattern Gait velocity: decreased  General Gait Details: Pt was unable to ambulate as far today due to increased lethargy/fatigue. She continues to require constant Vcs for posture correction and improved step quality. Pt tends to ambulate with shuffling flexed posture. Poor ability to maintain upright posture + improved step length.    Balance Overall balance assessment: Needs assistance Sitting-balance support: No upper extremity supported, Feet supported Sitting balance-Leahy Scale: Fair     Standing balance support: Bilateral upper extremity supported, During functional activity, Reliant on assistive device for balance Standing balance-Leahy Scale: Poor Standing balance comment: High fall risk, forward lean at trunk, requires mod vcs for upright posture and poor dynamic standing balance. Heavy use of BUE on RW or sink for steadiness, pt fearful of falls per subjective report       Cognition Arousal/Alertness: Lethargic Behavior During Therapy: WFL for tasks assessed/performed   Area of Impairment: Awareness, Safety/judgement    Safety/Judgement: Decreased awareness of safety, Decreased awareness of deficits  General Comments: Pt was asleep and remains  more lethargic overall today versus previoyus date. She did fully participate but lethargy greatly impacts session progression               Pertinent Vitals/Pain Pain Assessment Pain Assessment: 0-10 Pain Score: 4  Pain Location: LBP Pain Descriptors / Indicators: Aching, Dull Pain Intervention(s): Limited activity within patient's tolerance, Monitored during session, Repositioned           PT Goals (current goals can now be found in the care plan section) Acute Rehab PT Goals Patient Stated Goal: rehab then home Progress towards PT goals: Not progressing toward goals - comment (more lethargic/fatigued  today versus previous date)    Frequency    Min 3X/week      PT Plan Current plan remains appropriate       AM-PAC PT "6 Clicks" Mobility   Outcome Measure  Help needed turning from your back to your side while in a flat bed without using bedrails?: A Lot Help needed moving from lying on your back to sitting on the side of a flat bed without using bedrails?: A Little Help needed moving to and from a bed to a chair (including a wheelchair)?: A Lot Help needed standing up from a chair using your arms (e.g., wheelchair or bedside chair)?: A Lot Help needed to walk in hospital room?: A Lot Help needed climbing 3-5 steps with a railing? : A Lot 6 Click Score: 13    End of Session Equipment Utilized During Treatment: Gait belt Activity Tolerance: Patient limited by fatigue;Patient limited by lethargy Patient left: in chair;with call bell/phone within reach;with chair alarm set;with family/visitor present Nurse Communication: Mobility status PT Visit Diagnosis: Difficulty in walking, not elsewhere classified (R26.2);Unsteadiness on feet (R26.81);Other abnormalities of gait and mobility (R26.89)     Time: 0951-1010 PT Time Calculation (min) (ACUTE ONLY): 19 min  Charges:  $Neuromuscular Re-education: 8-22 mins                    Jetta Lout PTA 04/14/23, 11:10  AM

## 2023-04-14 NOTE — Progress Notes (Signed)
Chap responded to page. Family and Pt in the room requesting support for Adv. Dir. Mirna Mires provided education on completing the forms, Kindly page the [facility] once done with filling.   04/14/23 1000  Spiritual Encounters  Type of Visit Initial  Care provided to: Pt and family  Referral source Family  Reason for visit Advance directives  OnCall Visit Yes  Interventions  Spiritual Care Interventions Made Decision-making support/facilitation  Intervention Outcomes  Outcomes Awareness of support  Spiritual Care Plan  Spiritual Care Issues Still Outstanding No further spiritual care needs at this time (see row info)

## 2023-04-15 DIAGNOSIS — I639 Cerebral infarction, unspecified: Secondary | ICD-10-CM | POA: Diagnosis not present

## 2023-04-15 LAB — CBC
HCT: 35.9 % — ABNORMAL LOW (ref 36.0–46.0)
Hemoglobin: 12.1 g/dL (ref 12.0–15.0)
MCH: 31.3 pg (ref 26.0–34.0)
MCHC: 33.7 g/dL (ref 30.0–36.0)
MCV: 93 fL (ref 80.0–100.0)
Platelets: 365 10*3/uL (ref 150–400)
RBC: 3.86 MIL/uL — ABNORMAL LOW (ref 3.87–5.11)
RDW: 12.7 % (ref 11.5–15.5)
WBC: 15.3 10*3/uL — ABNORMAL HIGH (ref 4.0–10.5)
nRBC: 0 % (ref 0.0–0.2)

## 2023-04-15 LAB — URINALYSIS, COMPLETE (UACMP) WITH MICROSCOPIC
Bilirubin Urine: NEGATIVE
Glucose, UA: 50 mg/dL — AB
Hgb urine dipstick: NEGATIVE
Ketones, ur: NEGATIVE mg/dL
Nitrite: NEGATIVE
Protein, ur: NEGATIVE mg/dL
Specific Gravity, Urine: 1.015 (ref 1.005–1.030)
pH: 6 (ref 5.0–8.0)

## 2023-04-15 NOTE — Progress Notes (Signed)
Physical Therapy Treatment Patient Details Name: Sarah Freeman MRN: [id number] DOB: [birth year] Today's Date: 04/15/2023   History of Present Illness Sarah Freeman is a 89 yo F who comes to [facility] after acute onset insidious left hemi motor weakness while drinking coffee at breakfast.  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    PT Comments    Pt was with RN tech returning to EOB from [facility], upon arrival. She is alert but somewhat lethargic throughout session. Pt requires assistance to safely, stand to RW with LUE walker splint (assist with gripping handle). She tolerates ambulation ~ 50 ft however requires constant assistance + vcs for lateral wt shift and upright posture. Overall pt fatigue limits session. She will greatly benefit from continued skilled PT to maximize her independence and safety with all ADLs. DC recs remain appropriate.   Recommendations for follow up therapy are one component of a multi-disciplinary discharge planning process, led by the attending physician.  Recommendations may be updated based on patient status, additional functional criteria and insurance authorization.     Assistance Recommended at Discharge Frequent or constant Supervision/Assistance  Patient can return home with the following A little help with walking and/or transfers;A little help with bathing/dressing/bathroom;Assistance with cooking/housework;Assist for transportation;Help with stairs or ramp for entrance   Equipment Recommendations  Other (comment) (Defer to next level of care)       Precautions / Restrictions Precautions Precautions: Fall Precaution Comments: L inattention/ L hemi ( more deficits in LUE than LLE) Restrictions Weight Bearing Restrictions: No     Mobility  Bed Mobility  General bed mobility comments: pt was seated EOB upon arrival. Seated on BSC post session. pt is incontinent of urine and had urination in hallway during gait training. recommend use of  depends/mech underwear with pad for out of room mobility    Transfers Overall transfer level: Needs assistance Equipment used: Rolling walker (2 wheels) Transfers: Sit to/from Stand Sit to Stand: Min assist  General transfer comment: Pt requires min assist + moderate vcs for technique and sequencing. Increased time to perform task with Tcs for improvements    Ambulation/Gait Ambulation/Gait assistance: Min assist, Mod assist Gait Distance (Feet): 50 Feet Assistive device: Rolling walker (2 wheels) Gait Pattern/deviations: Trunk flexed, Decreased stance time - right, Decreased step length - left, Step-through pattern Gait velocity: decreased  General Gait Details: Pt has poor LLE advancement. requires constant min-mod assist to R lateral wt shift to allow LLE advancement/placement. Pt does fatigue quickly. Poor gait posture observed throughout.    Balance Overall balance assessment: Needs assistance Sitting-balance support: No upper extremity supported, Feet supported Sitting balance-Leahy Scale: Fair     Standing balance support: Bilateral upper extremity supported, During functional activity, Reliant on assistive device for balance Standing balance-Leahy Scale: Poor Standing balance comment: High fall risk, forward lean at trunk, requires mod vcs for upright posture and poor dynamic standing balance. Heavy use of BUE on RW with assistance for R lateral wt shift for LLE advancement.       Cognition Arousal/Alertness: Lethargic Behavior During Therapy: WFL for tasks assessed/performed Overall Cognitive Status: Impaired/Different from baseline Area of Impairment: Awareness, Safety/judgement      Safety/Judgement: Decreased awareness of safety, Decreased awareness of deficits Awareness: Intellectual   General Comments: Pt was just returning to EOB from urinating on BSC. She is alert but somewhat lethargic. She does agree to PT session. Has sharp pain with contact of LUE. UA  already collected, awaiting stool sample  General Comments General comments (skin integrity, edema, etc.): Pt urinated in hallway during ambulation      Pertinent Vitals/Pain Pain Assessment Pain Assessment: 0-10 Pain Score: 6  Pain Location: LUE Pain Descriptors / Indicators: Sharp Pain Intervention(s): Limited activity within patient's tolerance, Monitored during session, Premedicated before session, Repositioned     PT Goals (current goals can now be found in the care plan section) Acute Rehab PT Goals Patient Stated Goal: none stated Progress towards PT goals: Progressing toward goals    Frequency    Min 3X/week      PT Plan Current plan remains appropriate       AM-PAC PT "6 Clicks" Mobility   Outcome Measure  Help needed turning from your back to your side while in a flat bed without using bedrails?: A Lot Help needed moving from lying on your back to sitting on the side of a flat bed without using bedrails?: A Lot Help needed moving to and from a bed to a chair (including a wheelchair)?: A Lot Help needed standing up from a chair using your arms (e.g., wheelchair or bedside chair)?: A Little Help needed to walk in hospital room?: A Lot Help needed climbing 3-5 steps with a railing? : A Lot 6 Click Score: 13    End of Session Equipment Utilized During Treatment: Gait belt Activity Tolerance: Patient tolerated treatment well;Patient limited by fatigue Patient left: Other (comment) ([facility] with RN tech assisting aat conclusion of PT session.) Nurse Communication: Mobility status PT Visit Diagnosis: Difficulty in walking, not elsewhere classified (R26.2);Unsteadiness on feet (R26.81);Other abnormalities of gait and mobility (R26.89)     Time: 1610-9604 PT Time Calculation (min) (ACUTE ONLY): 19 min  Charges:  $Gait Training: 8-22 mins                     Jetta Lout PTA 04/15/23, 4:41 PM

## 2023-04-15 NOTE — Progress Notes (Signed)
Progress Note   Patient: Sarah Freeman ZOX:[id number] DOB: Feb 22, 1931 DOA: 04/06/2023     8 DOS: the patient was seen and examined on 04/15/2023    Subjective:  Patient seen and examined at bedside this morning Denies nausea vomiting abdominal pain chest pain or cough WBC slightly elevated and patient's family concerned that this could be possibly due to UTI Urinalysis have been requested   Brief hospital course: Sarah Freeman is a 89 y.o. female with medical history significant of dementia, prior CVA in 2017, hypertension, hyperlipidemia, bilateral carotid artery stenosis, hypothyroidism, stage III CKD, GERD, aortic atherosclerosis, who presents to the ED due to left leg weakness.  History was obtained from both patient and her family at bedside.  Around 9:30 AM she was having difficulty getting out of the chair and felt increasing weakness in the left lower extremity. ED workup: BP 142/86, HR 74. O2 sat 100% on RA. afebrile Temp 98.2.  WBC of 10.6, sodium 133, glucose 125, creatinine 0.96 with GFR 56.  CT head: no evidence of acute intracranial abnormality.  Neurology consulted as part of code stroke. [facility] contacted for admission.    Assessment and Plan:   # Acute CVA Patient presented with left lower extremity weakness, which has been resolved.  No neurological deficit at this time.  Previous history of CVA in 2017 with similar symptoms that was treated with DAPT for several years. Currently only on Plavix.  MRI brain, MRA H/N: Small acute infarct in the right corona radiata. Extensive chronic small vessel ischemic disease. Small chronic right frontal cortical infarcts. No large vessel occlusion. Intracranial atherosclerosis most notably involving the PCAs. No significant proximal stenosis in the anterior intracranial circulation.  Motion degraded neck MRA as detailed above, including limited assessment of the carotid bifurcations and left vertebral artery origin. No suspected flow  limiting cervical ICA stenosis, however carotid doppler ultrasound or CTA could be performed for further evaluation if clinically warranted. LDL 132, hemoglobin A1c 6.0 Patient was seen by neurology, recommended dual antiplatelet therapy for 21 days followed by Plavix only. S/p ASA 325 mg x 1 dose, Started aspirin 81 mg p.o. daily, Plavix 75 mg p.o. daily home dose. Started simvastatin 20 mg p.o. daily low-dose as patient has documented allergy to statin causing leg cramps.   We can gradually advance the dose as per tolerance. Continue to monitor on telemetry TTE LVEF 60 to 65%, grade 1 diastolic dysfunction, negative PFO PT/OT eval done, recommend SNF placement [facility] consulted for placement.   Leukocytosis likely reactive Follow-up on urinalysis  No indication for antibiotics at this time as vitals have remained stable  Assessment stable  # Sundowning and agitation-resolved   # Memory loss Likely vascular in nature given extensive small vessel disease on previous MRIs.   # HTN (hypertension) - Hold home antihypertensives to allow for permissive hypertension   # Chronic kidney disease, stage 3a Renal function currently at baseline.   Monitor renal function  # Chronic UTI - Continue chronic Macrobid   # Hypothyroidism - Continue home levothyroxine   # Constipation started MiraLAX twice daily, Dulcolax as needed.   # Vitamin B12 level 230, goal >400, started vitamin B12 1000 mcg p.o. daily, repeat vitamin B12 level after 3 to 6 months.   Chronic osteoporosis patient on 6 monthly injection of prolia.  Last injection was January 07, 2023    Body mass index is 35.64 kg/m.  Dietary modification and exercise as tolerated.     Diet: Regular  diet DVT Prophylaxis: Subcutaneous Lovenox    Advance goals of care discussion: DNR   Family Communication: family was present at bedside, at the time of interview.   Disposition: According to case management.  Skilled nursing facility  bed will be available on Monday   Physical Exam: General: NAD, sitting comfortably Appear in no distress, affect appropriate Eyes: Closed, sleepy ENT: Oral Mucosa Clear, moist  Neck: no JVD,  Cardiovascular: S1 and S2 Present, no Murmur,  Respiratory: good respiratory effort, Bilateral Air entry equal and Decreased, no Crackles, no wheezes Abdomen: Bowel Sound present, Soft and no tenderness,  Skin: no rashes Extremities: no Pedal edema, no calf tenderness Neurologic: without any new focal findings Gait not checked due to patient safety concerns  Physical Exam: Vitals:   04/14/23 1630 04/14/23 2028 04/15/23 0507 04/15/23 0740  BP: 131/62 (!) 138/58 (!) 118/58 (!) 128/51  Pulse: 79 75 82 77  Resp: 12 18 16 18   Temp: 98.4 F (36.9 C) 99.6 F (37.6 C) 98 F (36.7 C) 99.3 F (37.4 C)  TempSrc:  Oral    SpO2: 99% 98% 94% 93%  Weight:      Height:        Data Reviewed: Laboratory results reviewed by me today showing WBC 15.3   Time spent: 45 minutes  Author: Loyce Dys, MD 04/15/2023 3:48 PM  For on call review [email or web address].

## 2023-04-15 NOTE — TOC Transition Note (Addendum)
Transition of Care [facility]) - CM/SW Discharge Note   Patient Details  Name: Sarah Freeman MRN: [id number] Date of Birth: 1931/04/02  [facility] CM/SW Contact:  Allena Katz, LCSW Phone Number: 04/15/2023, 9:14 AM   Clinical Narrative:  Pt will discharge to [facility] Monday. MD unsure of discharge early Friday morning and once decided to discharge [facility] filled patients bed. Pt to discharge to [facility] on Monday.        Patient Goals and CMS Choice      Discharge Placement                         Discharge Plan and Services Additional resources added to the After Visit Summary for                                       Social Determinants of Health (SDOH) Interventions SDOH Screenings   Food Insecurity: No Food Insecurity (04/06/2023)  Housing: Low Risk  (04/06/2023)  Transportation Needs: No Transportation Needs (04/06/2023)  Utilities: Not At Risk (04/06/2023)  Alcohol Screen: Low Risk  (02/01/2023)  Depression (PHQ2-9): Low Risk  (02/01/2023)  Financial Resource Strain: Low Risk  (02/01/2023)  Physical Activity: Insufficiently Active (02/01/2023)  Social Connections: Moderately Isolated (02/01/2023)  Stress: No Stress Concern Present (02/01/2023)  Tobacco Use: Low Risk  (04/06/2023)     Readmission Risk Interventions     No data to display

## 2023-04-16 DIAGNOSIS — I639 Cerebral infarction, unspecified: Secondary | ICD-10-CM | POA: Diagnosis not present

## 2023-04-16 LAB — BASIC METABOLIC PANEL
Anion gap: 7 (ref 5–15)
BUN: 12 mg/dL (ref 8–23)
CO2: 24 mmol/L (ref 22–32)
Calcium: 8.4 mg/dL — ABNORMAL LOW (ref 8.9–10.3)
Chloride: 98 mmol/L (ref 98–111)
Creatinine, Ser: 0.92 mg/dL (ref 0.44–1.00)
GFR, Estimated: 59 mL/min — ABNORMAL LOW (ref >60)
Glucose, Bld: 117 mg/dL — ABNORMAL HIGH (ref 70–99)
Potassium: 4 mmol/L (ref 3.5–5.1)
Sodium: 129 mmol/L — ABNORMAL LOW (ref 135–145)

## 2023-04-16 LAB — CBC WITH DIFFERENTIAL/PLATELET
Abs Immature Granulocytes: 0.11 10*3/uL — ABNORMAL HIGH (ref 0.00–0.07)
Basophils Absolute: 0.1 10*3/uL (ref 0.0–0.1)
Basophils Relative: 1 %
Eosinophils Absolute: 0.2 10*3/uL (ref 0.0–0.5)
Eosinophils Relative: 2 %
HCT: 34.7 % — ABNORMAL LOW (ref 36.0–46.0)
Hemoglobin: 11.6 g/dL — ABNORMAL LOW (ref 12.0–15.0)
Immature Granulocytes: 1 %
Lymphocytes Relative: 18 %
Lymphs Abs: 2.7 10*3/uL (ref 0.7–4.0)
MCH: 31.1 pg (ref 26.0–34.0)
MCHC: 33.4 g/dL (ref 30.0–36.0)
MCV: 93 fL (ref 80.0–100.0)
Monocytes Absolute: 1.8 10*3/uL — ABNORMAL HIGH (ref 0.1–1.0)
Monocytes Relative: 12 %
Neutro Abs: 9.8 10*3/uL — ABNORMAL HIGH (ref 1.7–7.7)
Neutrophils Relative %: 66 %
Platelets: 348 10*3/uL (ref 150–400)
RBC: 3.73 MIL/uL — ABNORMAL LOW (ref 3.87–5.11)
RDW: 12.6 % (ref 11.5–15.5)
WBC: 14.7 10*3/uL — ABNORMAL HIGH (ref 4.0–10.5)
nRBC: 0 % (ref 0.0–0.2)

## 2023-04-16 LAB — OSMOLALITY: Osmolality: 270 mOsm/kg — ABNORMAL LOW (ref 275–295)

## 2023-04-16 LAB — OSMOLALITY, URINE: Osmolality, Ur: 221 mOsm/kg — ABNORMAL LOW (ref 300–900)

## 2023-04-16 LAB — SODIUM, URINE, RANDOM: Sodium, Ur: 21 mmol/L

## 2023-04-16 MED ORDER — SODIUM CHLORIDE 0.9 % IV SOLN: INTRAVENOUS | Status: AC

## 2023-04-16 NOTE — Progress Notes (Signed)
Progress Note   Patient: Sarah Freeman ZOX:[id number] DOB: 1930-12-08 DOA: 04/06/2023     9 DOS: the patient was seen and examined on 04/16/2023    Subjective:  Patient seen and examined at bedside this morning Denies nausea vomiting abdominal pain chest pain or cough Currently awaiting placement on Monday     Brief hospital course: Sarah Freeman is a 89 y.o. female with medical history significant of dementia, prior CVA in 2017, hypertension, hyperlipidemia, bilateral carotid artery stenosis, hypothyroidism, stage III CKD, GERD, aortic atherosclerosis, who presents to the ED due to left leg weakness.  History was obtained from both patient and her family at bedside.  Around 9:30 AM she was having difficulty getting out of the chair and felt increasing weakness in the left lower extremity. ED workup: BP 142/86, HR 74. O2 sat 100% on RA. afebrile Temp 98.2.  WBC of 10.6, sodium 133, glucose 125, creatinine 0.96 with GFR 56.  CT head: no evidence of acute intracranial abnormality.  Neurology consulted as part of code stroke. [facility] contacted for admission.    Assessment and Plan:   # Acute CVA Patient presented with left lower extremity weakness, which has been resolved.  No neurological deficit at this time.  Previous history of CVA in 2017 with similar symptoms that was treated with DAPT for several years. Currently only on Plavix.  MRI brain, MRA H/N: Small acute infarct in the right corona radiata. Extensive chronic small vessel ischemic disease. Small chronic right frontal cortical infarcts. No large vessel occlusion. Intracranial atherosclerosis most notably involving the PCAs. No significant proximal stenosis in the anterior intracranial circulation.  Motion degraded neck MRA as detailed above, including limited assessment of the carotid bifurcations and left vertebral artery origin. No suspected flow limiting cervical ICA stenosis, however carotid doppler ultrasound or CTA could be  performed for further evaluation if clinically warranted. LDL 132, hemoglobin A1c 6.0 Patient was seen by neurology, recommended dual antiplatelet therapy for 21 days followed by Plavix only. S/p ASA 325 mg x 1 dose, Started aspirin 81 mg p.o. daily, Plavix 75 mg p.o. daily home dose. Started simvastatin 20 mg p.o. daily low-dose as patient has documented allergy to statin causing leg cramps.   We can gradually advance the dose as per tolerance. Continue to monitor on telemetry TTE LVEF 60 to 65%, grade 1 diastolic dysfunction, negative PFO PT/OT eval done, recommend SNF placement [facility] consulted for placement.     Leukocytosis likely reactive Urinalysis does not suggest UTI Patient with no symptoms of UTI except urinary frequency No indication for antibiotics at this time as vitals have remained stable   Hypovolemic hyponatremia Sodium of 129 today, patient appears clinically dehydrated I have obtained serum osmolality, urine sodium and urine osmolality for better classification IV normal saline initiated Monitor sodium level closely   # Sundowning and agitation-resolved   # Memory loss Likely vascular in nature given extensive small vessel disease on previous MRIs.   # HTN (hypertension) -Monitor blood pressure closely   # Chronic kidney disease, stage 3a Renal function currently at baseline.   Monitor renal function   # Chronic UTI - Continue chronic Macrobid   # Hypothyroidism - Continue home levothyroxine   # Constipation started MiraLAX twice daily, Dulcolax as needed.   # Vitamin B12 level 230, goal >400, started vitamin B12 1000 mcg p.o. daily, repeat vitamin B12 level after 3 to 6 months.   Chronic osteoporosis patient on 6 monthly injection of prolia.  Last  injection was January 07, 2023     Body mass index is 35.64 kg/m.  Dietary modification and exercise as tolerated.     Diet: Regular diet DVT Prophylaxis: Subcutaneous Lovenox    Advance goals of  care discussion: DNR   Family Communication: family was present at bedside, at the time of interview.    Disposition: According to case management.  Skilled nursing facility bed will be available on Monday   Physical Exam: General: NAD, sitting comfortably Appear in no distress, affect appropriate Eyes: Closed, sleepy ENT: Oral Mucosa Clear, moist  Neck: no JVD,  Cardiovascular: S1 and S2 Present, no Murmur,  Respiratory: good respiratory effort, Bilateral Air entry equal and Decreased, no Crackles, no wheezes Abdomen: Bowel Sound present, Soft and no tenderness,  Skin: no rashes Extremities: no Pedal edema, no calf tenderness Neurologic: without any new focal findings Gait not checked due to patient safety concerns   Data Reviewed: Laboratory results reviewed by me today showing WBC 14.7 sodium of 129     Time spent: 40 minutes  Vitals:   04/15/23 0740 04/15/23 1614 04/15/23 1957 04/16/23 0405  BP: (!) 128/51 (!) 129/58 (!) 115/54 137/63  Pulse: 77  88 74  Resp: 18 18 16 16   Temp: 99.3 F (37.4 C) 97.9 F (36.6 C) 98.6 F (37 C) 98.5 F (36.9 C)  TempSrc:   Oral Oral  SpO2: 93% 99% 96% 97%  Weight:      Height:         Author: Loyce Dys, MD 04/16/2023 11:36 AM  For on call review [email or web address].

## 2023-04-16 NOTE — Plan of Care (Signed)
Patient with abdominal distention and  urine retention this morning , notified MD. Provider ordered I&O Cath 1100 ml output. Foley catheter placed per MD order. Patient tolerated procedure well.   Problem: Education: Goal: Knowledge of disease or condition will improve Outcome: Progressing Goal: Knowledge of secondary prevention will improve (MUST DOCUMENT ALL) Outcome: Progressing Goal: Knowledge of patient specific risk factors will improve Loraine Leriche N/A or DELETE if not current risk factor) Outcome: Progressing   Problem: Ischemic Stroke/TIA Tissue Perfusion: Goal: Complications of ischemic stroke/TIA will be minimized Outcome: Progressing   Problem: Coping: Goal: Will verbalize positive feelings about self Outcome: Progressing Goal: Will identify appropriate support needs Outcome: Progressing   Problem: Health Behavior/Discharge Planning: Goal: Ability to manage health-related needs will improve Outcome: Progressing Goal: Goals will be collaboratively established with patient/family Outcome: Progressing   Problem: Self-Care: Goal: Ability to participate in self-care as condition permits will improve Outcome: Progressing Goal: Verbalization of feelings and concerns over difficulty with self-care will improve Outcome: Progressing Goal: Ability to communicate needs accurately will improve Outcome: Progressing   Problem: Nutrition: Goal: Risk of aspiration will decrease Outcome: Progressing Goal: Dietary intake will improve Outcome: Progressing   Problem: Education: Goal: Knowledge of General Education information will improve Description: Including pain rating scale, medication(s)/side effects and non-pharmacologic comfort measures Outcome: Progressing   Problem: Health Behavior/Discharge Planning: Goal: Ability to manage health-related needs will improve Outcome: Progressing   Problem: Clinical Measurements: Goal: Ability to maintain clinical measurements within  normal limits will improve Outcome: Progressing Goal: Will remain free from infection Outcome: Progressing Goal: Diagnostic test results will improve Outcome: Progressing Goal: Respiratory complications will improve Outcome: Progressing Goal: Cardiovascular complication will be avoided Outcome: Progressing   Problem: Activity: Goal: Risk for activity intolerance will decrease Outcome: Progressing   Problem: Nutrition: Goal: Adequate nutrition will be maintained Outcome: Progressing   Problem: Coping: Goal: Level of anxiety will decrease Outcome: Progressing   Problem: Elimination: Goal: Will not experience complications related to bowel motility Outcome: Progressing Goal: Will not experience complications related to urinary retention Outcome: Progressing   Problem: Pain Managment: Goal: General experience of comfort will improve Outcome: Progressing   Problem: Safety: Goal: Ability to remain free from injury will improve Outcome: Progressing   Problem: Skin Integrity: Goal: Risk for impaired skin integrity will decrease Outcome: Progressing

## 2023-04-16 NOTE — Progress Notes (Signed)
   04/16/23 1600  Mobility  Activity Transferred from bed to chair  Range of Motion/Exercises Active  Level of Assistance +2 (takes two people) (she stood okay but it was difficult for her to follow commands to move her feet. the transfer was more of a stand a pivot to the chair. 2-person assistance.)  Musician  Activity Response Tolerated fair  Progressive Mobility Assessment: RN to perform for Med/Surg and Progressive Care at admission/transfer/prn  What is the highest level of mobility based on the progressive mobility assessment? Level 3 (Stands with assist) - Balance while standing  and cannot march in place  Is the above level different from baseline mobility prior to current illness? Yes - Recommend PT order

## 2023-04-17 DIAGNOSIS — I639 Cerebral infarction, unspecified: Secondary | ICD-10-CM | POA: Diagnosis not present

## 2023-04-17 LAB — CBC WITH DIFFERENTIAL/PLATELET
Abs Immature Granulocytes: 0.13 10*3/uL — ABNORMAL HIGH (ref 0.00–0.07)
Basophils Absolute: 0.1 10*3/uL (ref 0.0–0.1)
Basophils Relative: 1 %
Eosinophils Absolute: 0.4 10*3/uL (ref 0.0–0.5)
Eosinophils Relative: 3 %
HCT: 34.3 % — ABNORMAL LOW (ref 36.0–46.0)
Hemoglobin: 11.7 g/dL — ABNORMAL LOW (ref 12.0–15.0)
Immature Granulocytes: 1 %
Lymphocytes Relative: 19 %
Lymphs Abs: 2.5 10*3/uL (ref 0.7–4.0)
MCH: 31.5 pg (ref 26.0–34.0)
MCHC: 34.1 g/dL (ref 30.0–36.0)
MCV: 92.5 fL (ref 80.0–100.0)
Monocytes Absolute: 1.6 10*3/uL — ABNORMAL HIGH (ref 0.1–1.0)
Monocytes Relative: 12 %
Neutro Abs: 8.4 10*3/uL — ABNORMAL HIGH (ref 1.7–7.7)
Neutrophils Relative %: 64 %
Platelets: 366 10*3/uL (ref 150–400)
RBC: 3.71 MIL/uL — ABNORMAL LOW (ref 3.87–5.11)
RDW: 12.6 % (ref 11.5–15.5)
WBC: 13.1 10*3/uL — ABNORMAL HIGH (ref 4.0–10.5)
nRBC: 0 % (ref 0.0–0.2)

## 2023-04-17 LAB — BASIC METABOLIC PANEL
Anion gap: 5 (ref 5–15)
BUN: 13 mg/dL (ref 8–23)
CO2: 24 mmol/L (ref 22–32)
Calcium: 8.3 mg/dL — ABNORMAL LOW (ref 8.9–10.3)
Chloride: 102 mmol/L (ref 98–111)
Creatinine, Ser: 0.78 mg/dL (ref 0.44–1.00)
GFR, Estimated: >60 mL/min (ref >60)
Glucose, Bld: 95 mg/dL (ref 70–99)
Potassium: 4.2 mmol/L (ref 3.5–5.1)
Sodium: 131 mmol/L — ABNORMAL LOW (ref 135–145)

## 2023-04-17 LAB — GLUCOSE, CAPILLARY: Glucose-Capillary: 114 mg/dL — ABNORMAL HIGH (ref 70–99)

## 2023-04-17 NOTE — Plan of Care (Signed)

## 2023-04-17 NOTE — Progress Notes (Signed)
PROGRESS NOTE    Sarah Freeman  WUJ:[id number] DOB: 05-17-1931 DOA: 04/06/2023 PCP: Dorcas Carrow, DO   Brief Narrative:    89 y.o. female with medical history significant of dementia, prior CVA in 2017, hypertension, hyperlipidemia, bilateral carotid artery stenosis, hypothyroidism, stage III CKD, GERD, aortic atherosclerosis, who presents to the ED due to left leg weakness.  History was obtained from both patient and her family at bedside.  Upon admission patient was noted to be dehydrated, MRI showed small acute infarct.  Routine workup was performed with the help of neurology team.  Patient was started on aspirin, Plavix and statin.  PT/OT recommended SNF placement.  Assessment & Plan:  Principal Problem:   Acute stroke due to ischemia [facility]) Active Problems:   TIA (transient ischemic attack)   Hypothyroidism   Chronic UTI   Chronic kidney disease, stage 3 (HCC)   HTN (hypertension)   Memory loss   Acute CVA (cerebrovascular accident) (HCC)     Acute CVA -Small acute infarct in the right corona radiata as seen on the MRI brain.  No large vessel occlusion was noted.  LDL 132, A1c 6.0.  Seen by neurology.  Echocardiogram showed EF of 60 to 65%.  Aspirin, Plavix was recommended for 21 days followed by Plavix alone.  Crestor 20 mg daily was started.  PT/OT has recommended SNF.     Leukocytosis likely reactive Mild urinary tract infection On chronic Macrobid    Hypovolemic hyponatremia -Improved.  Sodium 131   Memory loss Likely vascular in nature given extensive small vessel disease on previous MRIs.   HTN (hypertension) - Blood pressure currently stable.  IV as needed.    Chronic kidney disease, stage 3a Renal function currently at baseline.   Monitor renal function   Hypothyroidism - Continue home Synthroid   Constipation started MiraLAX twice daily, Dulcolax as needed.    Vitamin B12 level 230, goal >400, started vitamin B12 1000 mcg p.o. daily, repeat  vitamin B12 level after 3 to 6 months.   Chronic osteoporosis patient on 6 monthly injection of prolia.  Last injection was January 07, 2023     Body mass index is 35.64 kg/m.  Dietary modification and exercise as tolerated.     Diet: Regular diet DVT Prophylaxis: Subcutaneous Lovenox    Advance goals of care discussion: DNR   Family Communication: Daughter-in-law present at bedside   Disposition: Plans for SNF placement on Monday    Diet Orders (From admission, onward)     Start     Ordered   04/06/23 1531  Diet regular Room service appropriate? Yes with Assist; Fluid consistency: Thin  Diet effective now       Comments: Please Cut meats and add gravy.  Cut spaghetti.  Question Answer Comment  Room service appropriate? Yes with Assist   Fluid consistency: Thin      04/06/23 1531            Subjective:  Doing well no complaints.  Patient sitting up in the recliner  Examination:  General exam: Appears calm and comfortable  Respiratory system: Clear to auscultation. Respiratory effort normal. Cardiovascular system: S1 & S2 heard, RRR. No JVD, murmurs, rubs, gallops or clicks. No pedal edema. Gastrointestinal system: Abdomen is nondistended, soft and nontender. No organomegaly or masses felt. Normal bowel sounds heard. Central nervous system: Alert and oriented. No focal neurological deficits. Extremities: Symmetric 5 x 5 power. Skin: No rashes, lesions or ulcers Psychiatry: Judgement and insight appear normal.  Mood & affect appropriate.  Objective: Vitals:   04/16/23 1609 04/16/23 2009 04/17/23 0459 04/17/23 0815  BP: 131/63 129/74 (!) 119/55 (!) 140/63  Pulse: (!) 101 99 66 73  Resp: 16 16 18 18   Temp: 99.6 F (37.6 C) 99.1 F (37.3 C) 98.2 F (36.8 C) 98.6 F (37 C)  TempSrc: Oral Oral    SpO2: 97% 98% 95% 100%  Weight:      Height:        Intake/Output Summary (Last 24 hours) at 04/17/2023 1248 Last data filed at 04/17/2023 0900 Gross per 24 hour   Intake 480 ml  Output 1200 ml  Net -720 ml   Filed Weights   04/06/23 1541  Weight: 72.1 kg    Scheduled Meds:  aspirin EC  81 mg Oral Daily   clopidogrel  75 mg Oral Daily   vitamin B-12  1,000 mcg Oral Daily   donepezil  5 mg Oral QHS   enoxaparin (LOVENOX) injection  40 mg Subcutaneous Q24H   gabapentin  100 mg Oral BID   levothyroxine  50 mcg Oral QAC breakfast   melatonin  5 mg Oral QHS   nitrofurantoin (macrocrystal-monohydrate)  100 mg Oral QHS   pantoprazole  40 mg Oral Daily   polyethylene glycol  17 g Oral BID   QUEtiapine  12.5 mg Oral QPM   simvastatin  20 mg Oral q1800   Continuous Infusions:  sodium chloride 75 mL/hr at 04/17/23 0048    Nutritional status     Body mass index is 35.64 kg/m.  Data Reviewed:   CBC: Recent Labs  Lab 04/12/23 0641 04/15/23 0534 04/16/23 0419 04/17/23 0506  WBC 11.3* 15.3* 14.7* 13.1*  NEUTROABS  --   --  9.8* 8.4*  HGB 13.0 12.1 11.6* 11.7*  HCT 39.1 35.9* 34.7* 34.3*  MCV 93.5 93.0 93.0 92.5  PLT 383 365 348 366   Basic Metabolic Panel: Recent Labs  Lab 04/12/23 0641 04/14/23 0555 04/16/23 0419 04/17/23 0506  NA  --   --  129* 131*  K  --   --  4.0 4.2  CL  --   --  98 102  CO2  --   --  24 24  GLUCOSE  --   --  117* 95  BUN  --   --  12 13  CREATININE 0.94 0.87 0.92 0.78  CALCIUM  --   --  8.4* 8.3*   GFR: Estimated Creatinine Clearance: 37.3 mL/min (by C-G formula based on SCr of 0.78 mg/dL). Liver Function Tests: No results for input(s): "AST", "ALT", "ALKPHOS", "BILITOT", "PROT", "ALBUMIN" in the last 168 hours. No results for input(s): "LIPASE", "AMYLASE" in the last 168 hours. No results for input(s): "AMMONIA" in the last 168 hours. Coagulation Profile: No results for input(s): "INR", "PROTIME" in the last 168 hours. Cardiac Enzymes: No results for input(s): "CKTOTAL", "CKMB", "CKMBINDEX", "TROPONINI" in the last 168 hours. BNP (last 3 results) No results for input(s): "PROBNP" in the  last 8760 hours. HbA1C: No results for input(s): "HGBA1C" in the last 72 hours. CBG: Recent Labs  Lab 04/12/23 2035  GLUCAP 165*   Lipid Profile: No results for input(s): "CHOL", "HDL", "LDLCALC", "TRIG", "CHOLHDL", "LDLDIRECT" in the last 72 hours. Thyroid Function Tests: No results for input(s): "TSH", "T4TOTAL", "FREET4", "T3FREE", "THYROIDAB" in the last 72 hours. Anemia Panel: No results for input(s): "VITAMINB12", "FOLATE", "FERRITIN", "TIBC", "IRON", "RETICCTPCT" in the last 72 hours. Sepsis Labs: No results for input(s): "PROCALCITON", "LATICACIDVEN"  in the last 168 hours.  No results found for this or any previous visit (from the past 240 hour(s)).       Radiology Studies: No results found.         LOS: 10 days   Time spent= 35 mins    Hershy Flenner Joline Maxcy, MD [facility]  If 7PM-7AM, please contact night-coverage  04/17/2023, 12:48 PM

## 2023-04-17 NOTE — Progress Notes (Signed)
PT Cancellation Note  Patient Details Name: [name] MRN: [id number] DOB: [birth year]   Cancelled Treatment:     Pt transition back to bed at arrival. Will hold session today. Pt agreeable to PT tomorrow    Kristina Bertone A Elam Ellis 04/17/2023, 1:27 PM

## 2023-04-18 DIAGNOSIS — I639 Cerebral infarction, unspecified: Secondary | ICD-10-CM | POA: Diagnosis not present

## 2023-04-18 LAB — CBC WITH DIFFERENTIAL/PLATELET
Abs Immature Granulocytes: 0.09 10*3/uL — ABNORMAL HIGH (ref 0.00–0.07)
Basophils Absolute: 0.1 10*3/uL (ref 0.0–0.1)
Basophils Relative: 1 %
Eosinophils Absolute: 0.3 10*3/uL (ref 0.0–0.5)
Eosinophils Relative: 3 %
HCT: 33.1 % — ABNORMAL LOW (ref 36.0–46.0)
Hemoglobin: 11 g/dL — ABNORMAL LOW (ref 12.0–15.0)
Immature Granulocytes: 1 %
Lymphocytes Relative: 21 %
Lymphs Abs: 2.2 10*3/uL (ref 0.7–4.0)
MCH: 31.2 pg (ref 26.0–34.0)
MCHC: 33.2 g/dL (ref 30.0–36.0)
MCV: 93.8 fL (ref 80.0–100.0)
Monocytes Absolute: 1.3 10*3/uL — ABNORMAL HIGH (ref 0.1–1.0)
Monocytes Relative: 12 %
Neutro Abs: 6.6 10*3/uL (ref 1.7–7.7)
Neutrophils Relative %: 62 %
Platelets: 388 10*3/uL (ref 150–400)
RBC: 3.53 MIL/uL — ABNORMAL LOW (ref 3.87–5.11)
RDW: 12.7 % (ref 11.5–15.5)
WBC: 10.6 10*3/uL — ABNORMAL HIGH (ref 4.0–10.5)
nRBC: 0 % (ref 0.0–0.2)

## 2023-04-18 LAB — BASIC METABOLIC PANEL
Anion gap: 7 (ref 5–15)
BUN: 13 mg/dL (ref 8–23)
CO2: 25 mmol/L (ref 22–32)
Calcium: 8.1 mg/dL — ABNORMAL LOW (ref 8.9–10.3)
Chloride: 99 mmol/L (ref 98–111)
Creatinine, Ser: 0.84 mg/dL (ref 0.44–1.00)
GFR, Estimated: >60 mL/min (ref >60)
Glucose, Bld: 94 mg/dL (ref 70–99)
Potassium: 3.8 mmol/L (ref 3.5–5.1)
Sodium: 131 mmol/L — ABNORMAL LOW (ref 135–145)

## 2023-04-18 MED ORDER — MELATONIN 5 MG PO TABS: 5.0000 mg | ORAL_TABLET | Freq: Every day | ORAL | 0 refills | Status: DC

## 2023-04-18 MED ORDER — TRAZODONE HCL 50 MG PO TABS: 50.0000 mg | ORAL_TABLET | Freq: Every evening | ORAL | Status: DC | PRN

## 2023-04-18 MED ORDER — CYANOCOBALAMIN 1000 MCG PO TABS: 1000.0000 ug | ORAL_TABLET | Freq: Every day | ORAL | Status: DC

## 2023-04-18 MED ORDER — QUETIAPINE FUMARATE 25 MG PO TABS: 12.5000 mg | ORAL_TABLET | Freq: Every evening | ORAL | Status: DC

## 2023-04-18 MED ORDER — ASPIRIN 81 MG PO TBEC: 81.0000 mg | DELAYED_RELEASE_TABLET | Freq: Every day | ORAL | 0 refills | Status: AC

## 2023-04-18 MED ORDER — SIMVASTATIN 20 MG PO TABS: 20.0000 mg | ORAL_TABLET | Freq: Every day | ORAL | Status: DC

## 2023-04-18 MED ORDER — BISACODYL 5 MG PO TBEC: 10.0000 mg | DELAYED_RELEASE_TABLET | Freq: Every day | ORAL | 0 refills | Status: DC | PRN

## 2023-04-18 NOTE — Care Management Important Message (Signed)
Important Message  Patient Details  Name: Sarah Freeman MRN: [id number] Date of Birth: 09/16/1931   Medicare Important Message Given:  Yes     Verita Schneiders Daleigh Pollinger 04/18/2023, 9:33 AM

## 2023-04-18 NOTE — Discharge Summary (Signed)
Physician Discharge Summary  Sarah Freeman ZOX:[id number] DOB: 1931/07/05 DOA: 04/06/2023  PCP: Dorcas Carrow, DO  Admit date: 04/06/2023 Discharge date: 04/18/2023  Admitted From: Home Disposition: SNF  Recommendations for Outpatient Follow-up:  Follow up with PCP in 1-2 weeks Please obtain BMP/CBC in one week your next doctors visit.  Aspirin, Plavix was recommended for 21 days followed by Plavix alone.  Crestor 20 mg daily was started.   Outpatient Neurology follow up in 3-4 weeks On Chronic Macrobid for UTIs  Home Health: SNF Equipment/Devices:None Discharge Condition: Stable CODE STATUS: DNR Diet recommendation: Heart healthy  Brief/Interim Summary:   89 y.o. female with medical history significant of dementia, prior CVA in 2017, hypertension, hyperlipidemia, bilateral carotid artery stenosis, hypothyroidism, stage III CKD, GERD, aortic atherosclerosis, who presents to the ED due to left leg weakness.  History was obtained from both patient and her family at bedside.  Upon admission patient was noted to be dehydrated, MRI showed small acute infarct.  Routine workup was performed with the help of neurology team.  Patient was started on aspirin, Plavix and statin.  PT/OT recommended SNF placement.   Assessment & Plan:  Principal Problem:   Acute stroke due to ischemia [facility]) Active Problems:   TIA (transient ischemic attack)   Hypothyroidism   Chronic UTI   Chronic kidney disease, stage 3 (HCC)   HTN (hypertension)   Memory loss   Acute CVA (cerebrovascular accident) (HCC)       Acute CVA -Small acute infarct in the right corona radiata as seen on the MRI brain.  No large vessel occlusion was noted.  LDL 132, A1c 6.0.  Seen by neurology.  Echocardiogram showed EF of 60 to 65%.  Aspirin, Plavix was recommended for 21 days followed by Plavix alone.  Crestor 20 mg daily was started.  PT/OT has recommended SNF.     Leukocytosis likely reactive Mild urinary tract  infection On chronic Macrobid     Hypovolemic hyponatremia -Improved.  Sodium 131    Memory loss Likely vascular in nature given extensive small vessel disease on previous MRIs.   HTN (hypertension) - Blood pressure currently stable.    Chronic kidney disease, stage 3a Renal function currently at baseline.   Monitor renal function   Hypothyroidism - Continue home Synthroid   Constipation started MiraLAX twice daily, Dulcolax as needed.    Vitamin B12 level 230, goal >400,vitamin B12 1000 mcg p.o. daily, repeat vitamin B12 level after 3 to 6 months.   Chronic osteoporosis patient on 6 monthly injection of prolia.  Last injection was January 07, 2023     Body mass index is 35.64 kg/m.  Dietary modification and exercise as tolerated.    Discharge Diagnoses:  Principal Problem:   Acute stroke due to ischemia [facility]) Active Problems:   TIA (transient ischemic attack)   Hypothyroidism   Chronic UTI   Chronic kidney disease, stage 3 (HCC)   HTN (hypertension)   Memory loss   Acute CVA (cerebrovascular accident) [facility])      Consultations: Neurology  Subjective: Doing ok no complaints.   Discharge Exam: Vitals:   04/18/23 0020 04/18/23 0750  BP: 130/60 (!) 147/72  Pulse: 62 67  Resp: 18 18  Temp: 98.6 F (37 C) 97.7 F (36.5 C)  SpO2:  100%   Vitals:   04/17/23 1732 04/17/23 2156 04/18/23 0020 04/18/23 0750  BP: 118/62 (!) 102/51 130/60 (!) 147/72  Pulse: 77 66 62 67  Resp: 16 16 18  18  Temp:  97.8 F (36.6 C) 98.6 F (37 C) 97.7 F (36.5 C)  TempSrc:   Axillary   SpO2: 97% 95%  100%  Weight:      Height:        General: Pt is alert, awake, not in acute distress Cardiovascular: RRR, S1/S2 +, no rubs, no gallops Respiratory: CTA bilaterally, no wheezing, no rhonchi Abdominal: Soft, NT, ND, bowel sounds + Extremities: no edema, no cyanosis  Discharge Instructions   Allergies as of 04/18/2023       Reactions   Statins Other (See Comments)    Leg cramps Leg cramps   Bacitracin    Neomycin    Neosporin + Pain Relief Max St  [neomy-bacit-polymyx-pramoxine]    Polymyxin B    Neomycin-polymyxin-gramicidin Other (See Comments)   Pt states wounds do not heal with neosporin Pt states wounds do not heal with neosporin        Medication List     STOP taking these medications    esomeprazole 40 MG capsule Commonly known as: NEXIUM       TAKE these medications    aspirin EC 81 MG tablet Take 1 tablet (81 mg total) by mouth daily for 21 days. Swallow whole. Start taking on: Apr 19, 2023   bisacodyl 5 MG EC tablet Commonly known as: DULCOLAX Take 2 tablets (10 mg total) by mouth daily as needed for moderate constipation.   cholecalciferol 25 MCG (1000 UNIT) tablet Commonly known as: VITAMIN D3 Take 1,000 Units by mouth daily.   clopidogrel 75 MG tablet Commonly known as: PLAVIX Take 1 tablet (75 mg total) by mouth daily.   cyanocobalamin 1000 MCG tablet Take 1 tablet (1,000 mcg total) by mouth daily. Start taking on: Apr 19, 2023   donepezil 5 MG tablet Commonly known as: ARICEPT Take 1 tablet (5 mg total) by mouth at bedtime.   gabapentin 100 MG capsule Commonly known as: NEURONTIN TAKE 2 CAPSULE(100 MG) BY MOUTH IN THE AM and 1 AT PM   levothyroxine 50 MCG tablet Commonly known as: SYNTHROID Take 1 tablet (50 mcg total) by mouth daily before breakfast.   melatonin 5 MG Tabs Take 1 tablet (5 mg total) by mouth at bedtime.   nitrofurantoin (macrocrystal-monohydrate) 100 MG capsule Commonly known as: Macrobid Take 1 capsule (100 mg total) by mouth at bedtime.   pantoprazole 40 MG tablet Commonly known as: PROTONIX Take 1 tablet (40 mg total) by mouth daily.   polyethylene glycol 17 g packet Commonly known as: MIRALAX / GLYCOLAX Take 17 g by mouth daily.   Prolia 60 MG/ML Sosy injection Generic drug: denosumab Inject 60 mg into the skin every 6 (six) months.   QUEtiapine 25 MG  tablet Commonly known as: SEROQUEL Take 0.5 tablets (12.5 mg total) by mouth every evening.   simvastatin 20 MG tablet Commonly known as: ZOCOR Take 1 tablet (20 mg total) by mouth daily at 6 PM.   traZODone 50 MG tablet Commonly known as: DESYREL Take 1 tablet (50 mg total) by mouth at bedtime as needed for sleep.        Contact information for follow-up providers     Johnson, Megan P, DO Follow up in 1 week(s).   Specialty: Family Medicine Contact information: [address] [place] [address] [phone]              Contact information for after-discharge care     Destination     [facility] Preferred SNF .   Service: Skilled Nursing Contact information: [address] [place] [address] [phone]                    Allergies  Allergen Reactions   Statins Other (See Comments)    Leg cramps Leg cramps   Bacitracin    Neomycin    Neosporin + Pain Relief Max St  [Neomy-Bacit-Polymyx-Pramoxine]    Polymyxin B    Neomycin-Polymyxin-Gramicidin Other (See Comments)    Pt states wounds do not heal with neosporin Pt states wounds do not heal with neosporin    You were cared for by a hospitalist during your hospital stay. If you have any questions about your discharge medications or the care you received while you were in the hospital after you are discharged, you can call the unit and asked to speak with the hospitalist on call if the hospitalist that took care of you is not available. Once you are discharged, your primary care physician will handle any further medical issues. Please note that no refills for any discharge medications will be authorized once you are discharged, as it is imperative that you return to your primary care physician (or establish a relationship with a primary care physician if you do not have one) for your aftercare needs so that they can  reassess your need for medications and monitor your lab values.  You were cared for by a hospitalist during your hospital stay. If you have any questions about your discharge medications or the care you received while you were in the hospital after you are discharged, you can call the unit and asked to speak with the hospitalist on call if the hospitalist that took care of you is not available. Once you are discharged, your primary care physician will handle any further medical issues. Please note that NO REFILLS for any discharge medications will be authorized once you are discharged, as it is imperative that you return to your primary care physician (or establish a relationship with a primary care physician if you do not have one) for your aftercare needs so that they can reassess your need for medications and monitor your lab values.  Please request your Prim.MD to go over all Hospital Tests and Procedure/Radiological results at the follow up, please get all Hospital records sent to your Prim MD by signing hospital release before you go home.  Get CBC, CMP, 2 view Chest X ray checked  by Primary MD during your next visit or SNF MD in 5-7 days ( we routinely change or add medications that can affect your baseline labs and fluid status, therefore we recommend that you get the mentioned basic workup next visit with your PCP, your PCP may decide not to get them or add new tests based on their clinical decision)  On your next visit with your primary care physician please Get Medicines reviewed and adjusted.  If you experience worsening of your admission symptoms, develop shortness of breath, life threatening emergency, suicidal or homicidal thoughts you must seek medical attention immediately by calling 911 or calling your MD immediately  if symptoms less severe.  You Must read complete instructions/literature along with all the possible adverse reactions/side effects for all the Medicines you take and  that have been prescribed to you. Take any new Medicines after you have completely understood and accpet all the possible adverse reactions/side effects.   Do not drive, operate  heavy machinery, perform activities at heights, swimming or participation in water activities or provide baby sitting services if your were admitted for syncope or siezures until you have seen by Primary MD or a Neurologist and advised to do so again.  Do not drive when taking Pain medications.   Procedures/Studies: ECHOCARDIOGRAM COMPLETE  Result Date: 04/07/2023    ECHOCARDIOGRAM REPORT   Patient Name:   Sarah Freeman [facility] Date of Exam: 04/07/2023 Medical Rec #:  [id number]        Height:       56.0 in Accession #:    [id number]       Weight:       159.0 lb Date of Birth:  1931-09-25        BSA:          1.610 m Patient Age:    89 years         BP:           149/84 mmHg Patient Gender: F                HR:           73 bpm. Exam Location:  [facility] Procedure: 2D Echo, Cardiac Doppler and Color Doppler Indications:     Stroke I63.9  History:         Patient has prior history of Echocardiogram examinations, most                  recent 02/09/2018. Stroke; Risk Factors:Dyslipidemia.  Sonographer:     Jeanella Craze Referring Phys:  [id number] Gillis Santa Diagnosing Phys: Julien Nordmann MD  Sonographer Comments: Suboptimal apical window. IMPRESSIONS  1. Left ventricular ejection fraction, by estimation, is 60 to 65%. The left ventricle has normal function. The left ventricle has no regional wall motion abnormalities. Left ventricular diastolic parameters are consistent with Grade I diastolic dysfunction (impaired relaxation).  2. Right ventricular systolic function is normal. The right ventricular size is normal.  3. The mitral valve is normal in structure. Mild mitral valve regurgitation. No evidence of mitral stenosis.  4. The aortic valve is tricuspid. Aortic valve regurgitation is not visualized. No aortic stenosis is present.  5. The inferior  vena cava is normal in size with greater than 50% respiratory variability, suggesting right atrial pressure of 3 mmHg. FINDINGS  Left Ventricle: Left ventricular ejection fraction, by estimation, is 60 to 65%. The left ventricle has normal function. The left ventricle has no regional wall motion abnormalities. The left ventricular internal cavity size was normal in size. There is  no left ventricular hypertrophy. Left ventricular diastolic parameters are consistent with Grade I diastolic dysfunction (impaired relaxation). Right Ventricle: The right ventricular size is normal. No increase in right ventricular wall thickness. Right ventricular systolic function is normal. Left Atrium: Left atrial size was normal in size. Right Atrium: Right atrial size was normal in size. Pericardium: There is no evidence of pericardial effusion. Mitral Valve: The mitral valve is normal in structure. There is mild calcification of the mitral valve leaflet(s). Mild mitral valve regurgitation. No evidence of mitral valve stenosis. MV peak gradient, 5.8 mmHg. The mean mitral valve gradient is 2.0 mmHg. Tricuspid Valve: The tricuspid valve is normal in structure. Tricuspid valve regurgitation is mild . No evidence of tricuspid stenosis. Aortic Valve: The aortic valve is tricuspid. Aortic valve regurgitation is not visualized. No aortic stenosis is present. Aortic valve mean gradient measures 4.0 mmHg. Aortic valve peak gradient measures 6.8 mmHg. Aortic  valve area, by VTI measures 3.72 cm. Pulmonic Valve: The pulmonic valve was normal in structure. Pulmonic valve regurgitation is not visualized. No evidence of pulmonic stenosis. Aorta: The aortic root is normal in size and structure. Venous: The inferior vena cava is normal in size with greater than 50% respiratory variability, suggesting right atrial pressure of 3 mmHg. IAS/Shunts: No atrial level shunt detected by color flow Doppler.  LEFT VENTRICLE PLAX 2D LVIDd:         3.10 cm    Diastology LVIDs:         2.00 cm   LV e' medial:    4.35 cm/s LV PW:         1.10 cm   LV E/e' medial:  14.3 LV IVS:        1.50 cm   LV e' lateral:   5.22 cm/s LVOT diam:     2.10 cm   LV E/e' lateral: 12.0 LV SV:         97 LV SV Index:   60 LVOT Area:     3.46 cm  RIGHT VENTRICLE RV Basal diam:  2.70 cm RV Mid diam:    2.70 cm RV S prime:     12.30 cm/s TAPSE (M-mode): 1.4 cm LEFT ATRIUM             Index       RIGHT ATRIUM          Index LA diam:        2.20 cm 1.37 cm/m  RA Area:     6.51 cm LA Vol (A2C):   13.1 ml 8.14 ml/m  RA Volume:   11.30 ml 7.02 ml/m LA Vol (A4C):   14.4 ml 8.94 ml/m LA Biplane Vol: 13.8 ml 8.57 ml/m  AORTIC VALVE AV Area (Vmax):    3.42 cm AV Area (Vmean):   3.61 cm AV Area (VTI):     3.72 cm AV Vmax:           130.50 cm/s AV Vmean:          93.850 cm/s AV VTI:            0.262 m AV Peak Grad:      6.8 mmHg AV Mean Grad:      4.0 mmHg LVOT Vmax:         129.00 cm/s LVOT Vmean:        97.700 cm/s LVOT VTI:          0.281 m LVOT/AV VTI ratio: 1.07  AORTA Ao Root diam: 3.10 cm MITRAL VALVE                TRICUSPID VALVE MV Area (PHT): 6.71 cm     TR Peak grad:   16.6 mmHg MV Area VTI:   3.95 cm     TR Vmax:        204.00 cm/s MV Peak grad:  5.8 mmHg MV Mean grad:  2.0 mmHg     SHUNTS MV Vmax:       1.20 m/s     Systemic VTI:  0.28 m MV Vmean:      73.5 cm/s    Systemic Diam: 2.10 cm MV Decel Time: 113 msec MV E velocity: 62.40 cm/s MV A velocity: 113.00 cm/s MV E/A ratio:  0.55 Julien Nordmann MD Electronically signed by Julien Nordmann MD Signature Date/Time: 04/07/2023/12:30:13 PM    Final    MR BRAIN WO CONTRAST  Result Date: 04/06/2023  CLINICAL DATA:  Neuro deficit, acute, stroke suspected. Left leg weakness and facial droop. EXAM: MRI HEAD WITHOUT CONTRAST MRA HEAD WITHOUT CONTRAST MRA NECK WITHOUT AND WITH CONTRAST TECHNIQUE: Multiplanar, multi-echo pulse sequences of the brain and surrounding structures were acquired without intravenous contrast. Angiographic images of  the Circle of Willis were acquired using MRA technique without intravenous contrast. Angiographic images of the neck were acquired using MRA technique without and with intravenous contrast. Carotid stenosis measurements (when applicable) are obtained utilizing NASCET criteria, using the distal internal carotid diameter as the denominator. CONTRAST:  7.58mL GADAVIST GADOBUTROL 1 MMOL/ML IV SOLN COMPARISON:  Head CT 04/06/2023.  Head MRI 09/25/2020. FINDINGS: MRI HEAD FINDINGS Brain: There is a 1 cm acute infarct in the right corona radiata. Patchy and confluent T2 hyperintensities elsewhere in the cerebral white matter bilaterally are unchanged from the prior MRI and are nonspecific but compatible with extensive chronic small vessel ischemic disease. Small chronic infarcts are again seen involving right frontal cortex and right corona radiata. There is moderate cerebral atrophy. No intracranial hemorrhage, mass, midline shift, or extra-axial fluid collection is identified. Vascular: Major intracranial vascular flow voids are preserved. Skull and upper cervical spine: Unremarkable bone marrow signal. Sinuses/Orbits: Bilateral cataract extraction. No significant inflammatory changes in the paranasal sinuses. Moderate chronic right mastoid effusion. Other: None. MRA HEAD FINDINGS Anterior circulation: The internal carotid arteries are patent from skull base to carotid termini without evidence of a significant stenosis. ACAs and MCAs are patent with mild branch vessel irregularity but no evidence of a proximal branch occlusion or significant proximal stenosis. No aneurysm is identified. Posterior circulation: The intracranial vertebral arteries are patent to the basilar with the left being dominant. Patent PICA and SCA origins are seen bilaterally. The basilar artery is widely patent. Posterior communicating arteries are diminutive or absent. The PCAs are patent proximally. There is a mild-to-moderate stenosis of the  right PCA near the P1-P2 junction, and there are severe P3 stenoses bilaterally. No aneurysm is identified. Anatomic variants: None. MRA NECK FINDINGS The study is mildly to moderately motion degraded. Aortic arch: Standard 3 vessel aortic arch. Patent brachiocephalic and subclavian arteries with potential bilateral subclavian artery stenoses versus motion artifact. Right carotid system: Patent with motion artifact limiting assessment for stenosis in the proximal common carotid artery and at the carotid bifurcation. No suspected flow limiting stenosis of the ICA. Left carotid system: Patent with motion artifact limiting assessment for stenosis in the proximal common carotid artery and at the carotid bifurcation. No suspected flow limiting stenosis of the ICA. Vertebral arteries: Patent with the left being dominant and with antegrade flow bilaterally. Limited assessment of the left vertebral origin due to motion. No evidence of a significant stenosis or dissection elsewhere on either side. IMPRESSION: 1. Small acute infarct in the right corona radiata. 2. Extensive chronic small vessel ischemic disease. 3. Small chronic right frontal cortical infarcts. 4. No large vessel occlusion. 5. Intracranial atherosclerosis most notably involving the PCAs. No significant proximal stenosis in the anterior intracranial circulation. 6. Motion degraded neck MRA as detailed above, including limited assessment of the carotid bifurcations and left vertebral artery origin. No suspected flow limiting cervical ICA stenosis, however carotid doppler ultrasound or CTA could be performed for further evaluation if clinically warranted. Electronically Signed   By: Sebastian Ache M.D.   On: 04/06/2023 19:25   MR ANGIO HEAD WO CONTRAST  Result Date: 04/06/2023 CLINICAL DATA:  Neuro deficit, acute, stroke suspected. Left leg weakness and facial droop.  EXAM: MRI HEAD WITHOUT CONTRAST MRA HEAD WITHOUT CONTRAST MRA NECK WITHOUT AND WITH CONTRAST  TECHNIQUE: Multiplanar, multi-echo pulse sequences of the brain and surrounding structures were acquired without intravenous contrast. Angiographic images of the Circle of Willis were acquired using MRA technique without intravenous contrast. Angiographic images of the neck were acquired using MRA technique without and with intravenous contrast. Carotid stenosis measurements (when applicable) are obtained utilizing NASCET criteria, using the distal internal carotid diameter as the denominator. CONTRAST:  7.59mL GADAVIST GADOBUTROL 1 MMOL/ML IV SOLN COMPARISON:  Head CT 04/06/2023.  Head MRI 09/25/2020. FINDINGS: MRI HEAD FINDINGS Brain: There is a 1 cm acute infarct in the right corona radiata. Patchy and confluent T2 hyperintensities elsewhere in the cerebral white matter bilaterally are unchanged from the prior MRI and are nonspecific but compatible with extensive chronic small vessel ischemic disease. Small chronic infarcts are again seen involving right frontal cortex and right corona radiata. There is moderate cerebral atrophy. No intracranial hemorrhage, mass, midline shift, or extra-axial fluid collection is identified. Vascular: Major intracranial vascular flow voids are preserved. Skull and upper cervical spine: Unremarkable bone marrow signal. Sinuses/Orbits: Bilateral cataract extraction. No significant inflammatory changes in the paranasal sinuses. Moderate chronic right mastoid effusion. Other: None. MRA HEAD FINDINGS Anterior circulation: The internal carotid arteries are patent from skull base to carotid termini without evidence of a significant stenosis. ACAs and MCAs are patent with mild branch vessel irregularity but no evidence of a proximal branch occlusion or significant proximal stenosis. No aneurysm is identified. Posterior circulation: The intracranial vertebral arteries are patent to the basilar with the left being dominant. Patent PICA and SCA origins are seen bilaterally. The basilar artery  is widely patent. Posterior communicating arteries are diminutive or absent. The PCAs are patent proximally. There is a mild-to-moderate stenosis of the right PCA near the P1-P2 junction, and there are severe P3 stenoses bilaterally. No aneurysm is identified. Anatomic variants: None. MRA NECK FINDINGS The study is mildly to moderately motion degraded. Aortic arch: Standard 3 vessel aortic arch. Patent brachiocephalic and subclavian arteries with potential bilateral subclavian artery stenoses versus motion artifact. Right carotid system: Patent with motion artifact limiting assessment for stenosis in the proximal common carotid artery and at the carotid bifurcation. No suspected flow limiting stenosis of the ICA. Left carotid system: Patent with motion artifact limiting assessment for stenosis in the proximal common carotid artery and at the carotid bifurcation. No suspected flow limiting stenosis of the ICA. Vertebral arteries: Patent with the left being dominant and with antegrade flow bilaterally. Limited assessment of the left vertebral origin due to motion. No evidence of a significant stenosis or dissection elsewhere on either side. IMPRESSION: 1. Small acute infarct in the right corona radiata. 2. Extensive chronic small vessel ischemic disease. 3. Small chronic right frontal cortical infarcts. 4. No large vessel occlusion. 5. Intracranial atherosclerosis most notably involving the PCAs. No significant proximal stenosis in the anterior intracranial circulation. 6. Motion degraded neck MRA as detailed above, including limited assessment of the carotid bifurcations and left vertebral artery origin. No suspected flow limiting cervical ICA stenosis, however carotid doppler ultrasound or CTA could be performed for further evaluation if clinically warranted. Electronically Signed   By: Sebastian Ache M.D.   On: 04/06/2023 19:25   MR ANGIO NECK W WO CONTRAST  Result Date: 04/06/2023 CLINICAL DATA:  Neuro deficit,  acute, stroke suspected. Left leg weakness and facial droop. EXAM: MRI HEAD WITHOUT CONTRAST MRA HEAD WITHOUT CONTRAST MRA NECK WITHOUT  AND WITH CONTRAST TECHNIQUE: Multiplanar, multi-echo pulse sequences of the brain and surrounding structures were acquired without intravenous contrast. Angiographic images of the Circle of Willis were acquired using MRA technique without intravenous contrast. Angiographic images of the neck were acquired using MRA technique without and with intravenous contrast. Carotid stenosis measurements (when applicable) are obtained utilizing NASCET criteria, using the distal internal carotid diameter as the denominator. CONTRAST:  7.65mL GADAVIST GADOBUTROL 1 MMOL/ML IV SOLN COMPARISON:  Head CT 04/06/2023.  Head MRI 09/25/2020. FINDINGS: MRI HEAD FINDINGS Brain: There is a 1 cm acute infarct in the right corona radiata. Patchy and confluent T2 hyperintensities elsewhere in the cerebral white matter bilaterally are unchanged from the prior MRI and are nonspecific but compatible with extensive chronic small vessel ischemic disease. Small chronic infarcts are again seen involving right frontal cortex and right corona radiata. There is moderate cerebral atrophy. No intracranial hemorrhage, mass, midline shift, or extra-axial fluid collection is identified. Vascular: Major intracranial vascular flow voids are preserved. Skull and upper cervical spine: Unremarkable bone marrow signal. Sinuses/Orbits: Bilateral cataract extraction. No significant inflammatory changes in the paranasal sinuses. Moderate chronic right mastoid effusion. Other: None. MRA HEAD FINDINGS Anterior circulation: The internal carotid arteries are patent from skull base to carotid termini without evidence of a significant stenosis. ACAs and MCAs are patent with mild branch vessel irregularity but no evidence of a proximal branch occlusion or significant proximal stenosis. No aneurysm is identified. Posterior circulation: The  intracranial vertebral arteries are patent to the basilar with the left being dominant. Patent PICA and SCA origins are seen bilaterally. The basilar artery is widely patent. Posterior communicating arteries are diminutive or absent. The PCAs are patent proximally. There is a mild-to-moderate stenosis of the right PCA near the P1-P2 junction, and there are severe P3 stenoses bilaterally. No aneurysm is identified. Anatomic variants: None. MRA NECK FINDINGS The study is mildly to moderately motion degraded. Aortic arch: Standard 3 vessel aortic arch. Patent brachiocephalic and subclavian arteries with potential bilateral subclavian artery stenoses versus motion artifact. Right carotid system: Patent with motion artifact limiting assessment for stenosis in the proximal common carotid artery and at the carotid bifurcation. No suspected flow limiting stenosis of the ICA. Left carotid system: Patent with motion artifact limiting assessment for stenosis in the proximal common carotid artery and at the carotid bifurcation. No suspected flow limiting stenosis of the ICA. Vertebral arteries: Patent with the left being dominant and with antegrade flow bilaterally. Limited assessment of the left vertebral origin due to motion. No evidence of a significant stenosis or dissection elsewhere on either side. IMPRESSION: 1. Small acute infarct in the right corona radiata. 2. Extensive chronic small vessel ischemic disease. 3. Small chronic right frontal cortical infarcts. 4. No large vessel occlusion. 5. Intracranial atherosclerosis most notably involving the PCAs. No significant proximal stenosis in the anterior intracranial circulation. 6. Motion degraded neck MRA as detailed above, including limited assessment of the carotid bifurcations and left vertebral artery origin. No suspected flow limiting cervical ICA stenosis, however carotid doppler ultrasound or CTA could be performed for further evaluation if clinically warranted.  Electronically Signed   By: Sebastian Ache M.D.   On: 04/06/2023 19:25   CT HEAD CODE STROKE WO CONTRAST  Result Date: 04/06/2023 CLINICAL DATA:  Code stroke.  Left-sided weakness EXAM: CT HEAD WITHOUT CONTRAST TECHNIQUE: Contiguous axial images were obtained from the base of the skull through the vertex without intravenous contrast. RADIATION DOSE REDUCTION: This exam was performed according  to the departmental dose-optimization program which includes automated exposure control, adjustment of the mA and/or kV according to patient size and/or use of iterative reconstruction technique. COMPARISON:  MRI Brain 09/25/20 FINDINGS: Brain: No hemorrhage. Sequela of moderate chronic microvascular ischemic change with a chronic right frontal lobe infarct. No CT evidence of an acute cortical infarct. No hydrocephalus. No extra-axial fluid collection Vascular: Calcification in the right sylvian fissure (series 120 is unchanged prior. Skull: Normal. Negative for fracture or focal lesion.2 Sinuses/Orbits: No middle ear or mastoid effusion. Paranasal sinuses are clear. Bilateral lens replacement. Orbits are otherwise unremarkable Other: None ASPECTS (Alberta Stroke Program Early CT Score): 10 IMPRESSION: No hemorrhage or CT evidence of an acute cortical infarct. Aspects is 10 when accounting for chronic findings. Findings were paged to Dr. Selina Cooley on 04/06/23 at 11:51 AM. Electronically Signed   By: Lorenza Cambridge M.D.   On: 04/06/2023 11:52     The results of significant diagnostics from this hospitalization (including imaging, microbiology, ancillary and laboratory) are listed below for reference.     Microbiology: No results found for this or any previous visit (from the past 240 hour(s)).   Labs: BNP (last 3 results) No results for input(s): "BNP" in the last 8760 hours. Basic Metabolic Panel: Recent Labs  Lab 04/12/23 0641 04/14/23 0555 04/16/23 0419 04/17/23 0506 04/18/23 0530  NA  --   --  129* 131* 131*   K  --   --  4.0 4.2 3.8  CL  --   --  98 102 99  CO2  --   --  24 24 25   GLUCOSE  --   --  117* 95 94  BUN  --   --  12 13 13   CREATININE 0.94 0.87 0.92 0.78 0.84  CALCIUM  --   --  8.4* 8.3* 8.1*   Liver Function Tests: No results for input(s): "AST", "ALT", "ALKPHOS", "BILITOT", "PROT", "ALBUMIN" in the last 168 hours. No results for input(s): "LIPASE", "AMYLASE" in the last 168 hours. No results for input(s): "AMMONIA" in the last 168 hours. CBC: Recent Labs  Lab 04/12/23 0641 04/15/23 0534 04/16/23 0419 04/17/23 0506 04/18/23 0530  WBC 11.3* 15.3* 14.7* 13.1* 10.6*  NEUTROABS  --   --  9.8* 8.4* 6.6  HGB 13.0 12.1 11.6* 11.7* 11.0*  HCT 39.1 35.9* 34.7* 34.3* 33.1*  MCV 93.5 93.0 93.0 92.5 93.8  PLT 383 365 348 366 388   Cardiac Enzymes: No results for input(s): "CKTOTAL", "CKMB", "CKMBINDEX", "TROPONINI" in the last 168 hours. BNP: Invalid input(s): "POCBNP" CBG: Recent Labs  Lab 04/12/23 2035 04/17/23 2157  GLUCAP 165* 114*   D-Dimer No results for input(s): "DDIMER" in the last 72 hours. Hgb A1c No results for input(s): "HGBA1C" in the last 72 hours. Lipid Profile No results for input(s): "CHOL", "HDL", "LDLCALC", "TRIG", "CHOLHDL", "LDLDIRECT" in the last 72 hours. Thyroid function studies No results for input(s): "TSH", "T4TOTAL", "T3FREE", "THYROIDAB" in the last 72 hours.  Invalid input(s): "FREET3" Anemia work up No results for input(s): "VITAMINB12", "FOLATE", "FERRITIN", "TIBC", "IRON", "RETICCTPCT" in the last 72 hours. Urinalysis    Component Value Date/Time   COLORURINE YELLOW (A) 04/15/2023 1052   APPEARANCEUR HAZY (A) 04/15/2023 1052   APPEARANCEUR Clear 01/11/2023 1029   LABSPEC 1.015 04/15/2023 1052   PHURINE 6.0 04/15/2023 1052   GLUCOSEU 50 (A) 04/15/2023 1052   HGBUR NEGATIVE 04/15/2023 1052   BILIRUBINUR NEGATIVE 04/15/2023 1052   BILIRUBINUR Negative 01/11/2023 1029   KETONESUR NEGATIVE  04/15/2023 1052   PROTEINUR NEGATIVE  04/15/2023 1052   NITRITE NEGATIVE 04/15/2023 1052   LEUKOCYTESUR TRACE (A) 04/15/2023 1052   Sepsis Labs Recent Labs  Lab 04/15/23 0534 04/16/23 0419 04/17/23 0506 04/18/23 0530  WBC 15.3* 14.7* 13.1* 10.6*   Microbiology No results found for this or any previous visit (from the past 240 hour(s)).   Time coordinating discharge:  I have spent 35 minutes face to face with the patient and on the ward discussing the patients care, assessment, plan and disposition with other care givers. >50% of the time was devoted counseling the patient about the risks and benefits of treatment/Discharge disposition and coordinating care.   SIGNED:   Dimple Nanas, MD  [facility] 04/18/2023, 10:24 AM   If 7PM-7AM, please contact night-coverage

## 2023-04-18 NOTE — Progress Notes (Signed)
On call provider notification [phone]  S:  Foley removed 1330 in anticipation of DC to SNF today.  First attempt to urinate 2100.  75 out and residual bladder scan 282 and 304.  Second attempt at this time unmeasurable and residual bladder scan 439 and 562.  B:  89F DNR HX chronic UTI, dementia, prior CVA in 2017, HTN, HLD, bilateral carotid artery stenosis, hypothyroidism, stage III CKD, GERD, aortic atherosclerosis, who presents to the ED due to left leg weakness. Noted to have small acute infarct in the right corona radiata as seen on the MRI brain,  A: As noted above to include patient reporting discomfort upon bladder palpation.  R:  Replace cath or I/O?  0443 Response There is a protocol for this, urethral catheter removal guidelines   Meanwhile, patient called for assistance to urinate again.  400 ml amber clear out successfully.  Residual bladder scan 22, 54, and 68.

## 2023-04-18 NOTE — Progress Notes (Signed)
Patient being discharged to [facility]. PIV removed. Called report to nurse Soumy at [facility]. All discharge paper work is in discharge packet including DNR paper. Patient will be transported via [facility].

## 2023-04-18 NOTE — TOC Transition Note (Addendum)
Transition of Care [facility]) - CM/SW Discharge Note   Patient Details  Name: Sarah Freeman MRN: [id number] Date of Birth: March 06, 1931  Transition of [facility]) CM/SW Contact:  Allena Katz, LCSW Phone Number: 04/18/2023, 9:53 AM   Clinical Narrative:  Pt has orders to discharge to [facility]. RN given number for report. Tiffany with [facility] notified. DC summary sent. CSW spoke with daughter who would like to use safe transport to transport pt to facility.        Patient Goals and CMS Choice      Discharge Placement                         Discharge Plan and Services Additional resources added to the After Visit Summary for                                       Social Determinants of Health (SDOH) Interventions SDOH Screenings   Food Insecurity: No Food Insecurity (04/06/2023)  Housing: Low Risk  (04/06/2023)  Transportation Needs: No Transportation Needs (04/06/2023)  Utilities: Not At Risk (04/06/2023)  Alcohol Screen: Low Risk  (02/01/2023)  Depression (PHQ2-9): Low Risk  (02/01/2023)  Financial Resource Strain: Low Risk  (02/01/2023)  Physical Activity: Insufficiently Active (02/01/2023)  Social Connections: Moderately Isolated (02/01/2023)  Stress: No Stress Concern Present (02/01/2023)  Tobacco Use: Low Risk  (04/06/2023)     Readmission Risk Interventions     No data to display

## 2023-04-28 LAB — BASIC METABOLIC PANEL
BUN: 12 (ref 4–21)
CO2: 25 — AB (ref 13–22)
Chloride: 103 (ref 99–108)
Creatinine: 0.9 (ref 0.5–1.1)
Glucose: 145
Potassium: 5.8 mEq/L — AB (ref 3.5–5.1)
Sodium: 134 — AB (ref 137–147)

## 2023-04-28 LAB — HEPATIC FUNCTION PANEL
ALT: 18 U/L (ref 7–35)
AST: 26 (ref 13–35)
Alkaline Phosphatase: 66 (ref 25–125)
Bilirubin, Total: 0.3

## 2023-04-28 LAB — COMPREHENSIVE METABOLIC PANEL
Albumin: 3.4 — AB (ref 3.5–5.0)
Calcium: 8.5 — AB (ref 8.7–10.7)
eGFR: 61

## 2023-05-04 ENCOUNTER — Encounter: Payer: Self-pay | Admitting: Family Medicine

## 2023-05-10 ENCOUNTER — Telehealth: Payer: Self-pay | Admitting: Family Medicine

## 2023-05-10 NOTE — Telephone Encounter (Signed)
Home Health Verbal Orders - Caller/Agency: Herby Abraham with [facility] Number: [phone] Requesting: Speech Therapy Frequency: Clydie Braun states that her schedule is full this week and is requesting to do the evaluation for the pt next week.  Marland Kitchen

## 2023-05-10 NOTE — Telephone Encounter (Signed)
Copied from CRM [id number]. Topic: Quick Communication - Home Health Verbal Orders >> May 10, 2023 11:31 AM Clide Dales wrote: Caller/Agency: Carmelia Bake Callback Number: [phone] Requesting OT/PT/Skilled Nursing/Social Work/Speech Therapy: OT Frequency: 1w7

## 2023-05-10 NOTE — Telephone Encounter (Signed)
Left message for Iona Coach from [facility] to provide verbal OK orders per Dr Laural Benes. Advised to give our office a call back if she has any questions regarding the patient.

## 2023-05-10 NOTE — Telephone Encounter (Signed)
OK for verbal orders?

## 2023-05-11 NOTE — Telephone Encounter (Signed)
OK for verbal orders?

## 2023-05-11 NOTE — Telephone Encounter (Signed)
Left message for Herby Abraham from [facility] to provide verbal OK for orders for the patient per Dr Laural Benes. Advised to give our office a call back if she has any questions or concerns regarding the telephone message.

## 2023-05-16 ENCOUNTER — Encounter: Payer: Self-pay | Admitting: Medical

## 2023-05-16 ENCOUNTER — Ambulatory Visit: Payer: Medicare Other | Attending: Cardiology | Admitting: Medical

## 2023-05-16 VITALS — BP 115/78 | HR 83 | Ht <= 58 in | Wt 166.2 lb

## 2023-05-16 DIAGNOSIS — I10 Essential (primary) hypertension: Secondary | ICD-10-CM | POA: Diagnosis not present

## 2023-05-16 DIAGNOSIS — Z95818 Presence of other cardiac implants and grafts: Secondary | ICD-10-CM | POA: Diagnosis not present

## 2023-05-16 DIAGNOSIS — Z8673 Personal history of transient ischemic attack (TIA), and cerebral infarction without residual deficits: Secondary | ICD-10-CM | POA: Diagnosis not present

## 2023-05-16 DIAGNOSIS — I471 Supraventricular tachycardia, unspecified: Secondary | ICD-10-CM

## 2023-05-16 NOTE — Progress Notes (Signed)
Cardiology Office Note:    Date:  05/20/2023   ID:  Sarah Freeman, DOB 05-29-1931, MRN [id number]  PCP:  Dorcas Carrow, DO  [facility] Cardiologist:  Julien Nordmann, MD  [facility] Electrophysiologist:  None   Referring MD: Dorcas Carrow, DO   Chief Complaint: overdue follow-up  History of Present Illness:    Sarah Freeman is a 89 y.o. female with a hx of HFpEF, b/l carotid dz, CKD stage 3, aortic atherosclerosis, stroke/watershed infarct in 2019, distribution infarcts in the R MCA, UTI, HLD who presents for overdue follow-up.   Patient underwent loop implantation in 2019 given h/o stroke. So far, no afib has been detected.   She was last seen 08/2020 and was stable from a cardiac perspective. Starting a statin Janene Harvey was discussed, but deferred.   Patient was admitted 03/2023 for acute stroke due to ischemia. Echo showed LVEF 60-65%.   Today, the patient had recent stroke, she is weak on the left side and uses a walker. She was discharged to [facility]. She is back at home with 24/7 care. She is doing OT/PT and ST. Patient denies chest pain, shortness of breath, orthopnea, LLE, pnd. They are wanting to leave the ILR in place.   Past Medical History:  Diagnosis Date   Hyperlipidemia    Hypothyroid    Stroke [facility])    Urinary incontinence     Past Surgical History:  Procedure Laterality Date   BREAST BIOPSY Left 2000?   neg. dr. Rutherford Nail office   CHOLECYSTECTOMY  1964   COLONOSCOPY     Dr Maryruth Bun   DILATION AND CURETTAGE OF UTERUS     EYE SURGERY     LOOP RECORDER INSERTION N/A 02/09/2018   Procedure: LOOP RECORDER INSERTION;  Surgeon: Duke Salvia, MD;  Location: [facility] INVASIVE CV LAB;  Service: Cardiovascular;  Laterality: N/A;   SKIN CANCER EXCISION     face   TEE WITHOUT CARDIOVERSION N/A 02/09/2018   Procedure: TRANSESOPHAGEAL ECHOCARDIOGRAM (TEE);  Surgeon: Antonieta Iba, MD;  Location: [facility] ORS;  Service: Cardiovascular;  Laterality:  N/A;   TOTAL ABDOMINAL HYSTERECTOMY W/ BILATERAL SALPINGOOPHORECTOMY     age 71    Current Medications: Current Meds  Medication Sig   cholecalciferol (VITAMIN D3) 25 MCG (1000 UNIT) tablet Take 1,000 Units by mouth daily.   clopidogrel (PLAVIX) 75 MG tablet Take 1 tablet (75 mg total) by mouth daily.   cyanocobalamin 1000 MCG tablet Take 1 tablet (1,000 mcg total) by mouth daily.   donepezil (ARICEPT) 5 MG tablet Take 1 tablet (5 mg total) by mouth at bedtime.   gabapentin (NEURONTIN) 100 MG capsule TAKE 2 CAPSULE(100 MG) BY MOUTH IN THE AM and 1 AT PM (Patient taking differently: 2 mg at bedtime. TAKE 2 CAPSULE(100 MG) BY MOUTH IN THE AM and 1 AT PM)   levothyroxine (SYNTHROID) 50 MCG tablet Take 1 tablet (50 mcg total) by mouth daily before breakfast.   polyethylene glycol (MIRALAX / GLYCOLAX) 17 g packet Take 17 g by mouth daily.   PROLIA 60 MG/ML SOSY injection Inject 60 mg into the skin every 6 (six) months.   [DISCONTINUED] bisacodyl (DULCOLAX) 5 MG EC tablet Take 2 tablets (10 mg total) by mouth daily as needed for moderate constipation. (Patient not taking: Reported on 05/17/2023)   [DISCONTINUED] melatonin 5 MG TABS Take 1 tablet (5 mg total) by mouth at bedtime. (Patient not taking: Reported on 05/17/2023)   [DISCONTINUED] pantoprazole (PROTONIX) 40  MG tablet Take 1 tablet (40 mg total) by mouth daily.   [DISCONTINUED] QUEtiapine (SEROQUEL) 25 MG tablet Take 0.5 tablets (12.5 mg total) by mouth every evening.   [DISCONTINUED] simvastatin (ZOCOR) 20 MG tablet Take 1 tablet (20 mg total) by mouth daily at 6 PM.   [DISCONTINUED] traZODone (DESYREL) 50 MG tablet Take 1 tablet (50 mg total) by mouth at bedtime as needed for sleep. (Patient not taking: Reported on 05/17/2023)     Allergies:   Statins, Bacitracin, Neomycin, Neosporin + pain relief max st  [neomy-bacit-polymyx-pramoxine], Polymyxin b, and Neomycin-polymyxin-gramicidin   Social History   Socioeconomic History   Marital  status: Widowed    Spouse name: Not on file   Number of children: Not on file   Years of education: Not on file   Highest education level: Not on file  Occupational History   Occupation: retired  Tobacco Use   Smoking status: Never    Passive exposure: Never   Smokeless tobacco: Never  Vaping Use   Vaping Use: Never used  Substance and Sexual Activity   Alcohol use: No    Alcohol/week: 0.0 standard drinks of alcohol   Drug use: No   Sexual activity: Not Currently  Other Topics Concern   Not on file  Social History Narrative   Not on file   Social Determinants of Health   Financial Resource Strain: Low Risk  (02/01/2023)   Overall Financial Resource Strain (CARDIA)    Difficulty of Paying Living Expenses: Not hard at all  Food Insecurity: No Food Insecurity (04/06/2023)   Hunger Vital Sign    Worried About Running Out of Food in the Last Year: Never true    Ran Out of Food in the Last Year: Never true  Transportation Needs: No Transportation Needs (04/06/2023)   PRAPARE - Administrator, Civil Service (Medical): No    Lack of Transportation (Non-Medical): No  Physical Activity: Insufficiently Active (02/01/2023)   Exercise Vital Sign    Days of Exercise per Week: 4 days    Minutes of Exercise per Session: 10 min  Stress: No Stress Concern Present (02/01/2023)   [employer] of Occupational Health - Occupational Stress Questionnaire    Feeling of Stress : Not at all  Social Connections: Moderately Isolated (02/01/2023)   Social Connection and Isolation Panel [NHANES]    Frequency of Communication with Friends and Family: More than three times a week    Frequency of Social Gatherings with Friends and Family: More than three times a week    Attends Religious Services: More than 4 times per year    Active Member of [employer] or Organizations: No    Attends Banker Meetings: Never    Marital Status: Widowed     Family History: The patient's family history  includes CAD in her mother; Cancer in her father; Diabetes in her maternal grandmother; Failure to thrive in her mother; Heart disease in her daughter, maternal grandfather, maternal grandmother, and son; Hyperlipidemia in her daughter; Hypertension in her sister; Stroke in her father. There is no history of Breast cancer.  ROS:   Please see the history of present illness.     All other systems reviewed and are negative.  EKGs/Labs/Other Studies Reviewed:    The following studies were reviewed today:  Echo 03/2023  1. Left ventricular ejection fraction, by estimation, is 60 to 65%. The  left ventricle has normal function. The left ventricle has no regional  wall motion  abnormalities. Left ventricular diastolic parameters are  consistent with Grade I diastolic  dysfunction (impaired relaxation).   2. Right ventricular systolic function is normal. The right ventricular  size is normal.   3. The mitral valve is normal in structure. Mild mitral valve  regurgitation. No evidence of mitral stenosis.   4. The aortic valve is tricuspid. Aortic valve regurgitation is not  visualized. No aortic stenosis is present.   5. The inferior vena cava is normal in size with greater than 50%  respiratory variability, suggesting right atrial pressure of 3 mmHg.   EKG:  EKG is ordered today.  The ekg ordered today demonstrates NSR 83bpm, LAD, TWI III  Recent Labs: 01/04/2023: TSH 3.390 04/08/2023: Magnesium 2.1 05/17/2023: ALT 18; BUN 8; Creatinine, Ser 0.95; Hemoglobin 12.6; Platelets 322; Potassium 4.5; Sodium 136  Recent Lipid Panel    Component Value Date/Time   CHOL 157 05/17/2023 1524   CHOL 223 (H) 11/03/2016 0904   TRIG 167 (H) 05/17/2023 1524   TRIG 180 (H) 11/03/2016 0904   HDL 51 05/17/2023 1524   CHOLHDL 3.8 04/06/2023 1135   VLDL 19 04/06/2023 1135   VLDL 36 (H) 11/03/2016 0904   LDLCALC 78 05/17/2023 1524    Physical Exam:    VS:  BP 115/78 (BP Location: Left Arm, Patient Position:  Sitting, Cuff Size: Large)   Pulse 83   Ht 4\' 8"  (1.422 m)   Wt 166 lb 4 oz (75.4 kg)   SpO2 99%   BMI 37.27 kg/m     Wt Readings from Last 3 Encounters:  05/17/23 165 lb 9.6 oz (75.1 kg)  05/16/23 166 lb 4 oz (75.4 kg)  04/06/23 158 lb 15.2 oz (72.1 kg)     GEN:  Well nourished, well developed in no acute distress HEENT: Normal NECK: No JVD; No carotid bruits LYMPHATICS: No lymphadenopathy CARDIAC: RRR, no murmurs, rubs, gallops RESPIRATORY:  Clear to auscultation without rales, wheezing or rhonchi  ABDOMEN: Soft, non-tender, non-distended MUSCULOSKELETAL:  No edema; No deformity  SKIN: Warm and dry NEUROLOGIC:  Alert and oriented x 3 PSYCHIATRIC:  Normal affect   ASSESSMENT:    1. Status post placement of implantable loop recorder   2. History of multiple strokes   3. Essential hypertension    PLAN:    In order of problems listed above:  S/p ILR This was implanted in 2019 for multiple strokes. No afib was noted. Transmission stopped in 2021 and it was never taken out. Given recent stroke I will refer back to EP to further discuss ILR.   H/o multiple strokes Most recent 03/2023 and has residual left sided weakness and is having trouble walking. She is on Plavix and simvastatin. Echo during admission showed LVEF   HTN BP is good today, she is not an antihypertensives at baseline.   Disposition: Follow up in 3 month(s) with MD    Signed, Stepan Verrette David Stall, PA-C  05/20/2023 7:50 AM    [facility]

## 2023-05-16 NOTE — Patient Instructions (Signed)
Medication Instructions:  Your physician recommends that you continue on your current medications as directed. Please refer to the Current Medication list given to you today.  *If you need a refill on your cardiac medications before your next appointment, please call your pharmacy*  Lab Work: -None ordered  Testing/Procedures: -None ordered  Follow-Up: At [facility], you and your health needs are our priority.  As part of our continuing mission to provide you with exceptional heart care, we have created designated Provider Care Teams.  These Care Teams include your primary Cardiologist (physician) and Advanced Practice Providers (APPs -  Physician Assistants and Nurse Practitioners) who all work together to provide you with the care you need, when you need it.  Your next appointment:   3 month(s)  Provider:   Julien Nordmann, MD    Other Instructions -Follow-up appointment with Dr. Graciela Husbands for loop recorder

## 2023-05-17 ENCOUNTER — Ambulatory Visit (INDEPENDENT_AMBULATORY_CARE_PROVIDER_SITE_OTHER): Payer: Medicare Other | Admitting: Family Medicine

## 2023-05-17 ENCOUNTER — Encounter: Payer: Self-pay | Admitting: Family Medicine

## 2023-05-17 ENCOUNTER — Telehealth: Payer: Self-pay

## 2023-05-17 VITALS — BP 108/73 | HR 77 | Temp 98.4°F | Ht <= 58 in | Wt 165.6 lb

## 2023-05-17 DIAGNOSIS — I6523 Occlusion and stenosis of bilateral carotid arteries: Secondary | ICD-10-CM

## 2023-05-17 DIAGNOSIS — I700 Atherosclerosis of aorta: Secondary | ICD-10-CM

## 2023-05-17 DIAGNOSIS — I10 Essential (primary) hypertension: Secondary | ICD-10-CM

## 2023-05-17 DIAGNOSIS — F4321 Adjustment disorder with depressed mood: Secondary | ICD-10-CM

## 2023-05-17 DIAGNOSIS — I6930 Unspecified sequelae of cerebral infarction: Secondary | ICD-10-CM

## 2023-05-17 DIAGNOSIS — G8194 Hemiplegia, unspecified affecting left nondominant side: Secondary | ICD-10-CM

## 2023-05-17 MED ORDER — QUETIAPINE FUMARATE 25 MG PO TABS: 12.5000 mg | ORAL_TABLET | Freq: Every evening | ORAL | 1 refills | Status: DC

## 2023-05-17 MED ORDER — SIMVASTATIN 20 MG PO TABS: 20.0000 mg | ORAL_TABLET | Freq: Every day | ORAL | 1 refills | Status: DC

## 2023-05-17 MED ORDER — PANTOPRAZOLE SODIUM 40 MG PO TBEC: 40.0000 mg | DELAYED_RELEASE_TABLET | Freq: Every day | ORAL | 1 refills | Status: DC

## 2023-05-17 NOTE — Progress Notes (Unsigned)
BP 108/73   Pulse 77   Temp 98.4 F (36.9 C) (Oral)   Ht 4\' 8"  (1.422 m)   Wt 165 lb 9.6 oz (75.1 kg)   SpO2 98%   BMI 37.13 kg/m    Subjective:    Patient ID: Sarah Freeman, female    DOB: 1931-05-02, 89 y.o.   MRN: [id number]  HPI: Sarah Freeman is a 89 y.o. female  Chief Complaint  Patient presents with   Mild Stroke   Hospitalization Follow-up   Medication Dose Change    Patient daughter would like to discuss dose change of Seroquel.    Memory Changes    Patient daughter say patient is forgetting to do things and would like discuss with provider.    Home Health Orders   Orders     Patient daughter would like to discuss walker and other orders for the patient.    Transition of Care Hospital Follow up.   Hospital/Facility: [facility] D/C Physician: Dr. Nelson Chimes D/C Date: 04/18/23, D/C'd from SNF on 05/01/23  Records Requested: 05/17/23 Records Received: 05/17/23 Records Reviewed: 05/17/23  Diagnoses on Discharge: Acute stroke due to ischemia [facility]) Active Problems:   TIA (transient ischemic attack)   Hypothyroidism   Chronic UTI   Chronic kidney disease, stage 3 (HCC)   HTN (hypertension)   Memory loss   Acute CVA (cerebrovascular accident) [facility])   Date of interactive Contact within 48 hours of discharge: NOT DONE Contact was through:  N/A  Date of 7 day or 14 day face-to-face visit:  05/17/23  NOT within 14 days  Outpatient Encounter Medications as of 05/17/2023  Medication Sig Note   aspirin EC 81 MG tablet Take 81 mg by mouth daily. Swallow whole.    cholecalciferol (VITAMIN D3) 25 MCG (1000 UNIT) tablet Take 1,000 Units by mouth daily.    clopidogrel (PLAVIX) 75 MG tablet Take 1 tablet (75 mg total) by mouth daily.    cyanocobalamin 1000 MCG tablet Take 1 tablet (1,000 mcg total) by mouth daily.    donepezil (ARICEPT) 5 MG tablet Take 1 tablet (5 mg total) by mouth at bedtime.    gabapentin (NEURONTIN) 100 MG capsule TAKE 2 CAPSULE(100 MG) BY MOUTH IN THE AM  and 1 AT PM (Patient taking differently: 2 mg at bedtime. TAKE 2 CAPSULE(100 MG) BY MOUTH IN THE AM and 1 AT PM) 04/06/2023: Patient takes 200 mg once daily   levothyroxine (SYNTHROID) 50 MCG tablet Take 1 tablet (50 mcg total) by mouth daily before breakfast.    nitrofurantoin, macrocrystal-monohydrate, (MACROBID) 100 MG capsule Take 1 capsule (100 mg total) by mouth at bedtime.    pantoprazole (PROTONIX) 40 MG tablet Take 1 tablet (40 mg total) by mouth daily.    polyethylene glycol (MIRALAX / GLYCOLAX) 17 g packet Take 17 g by mouth daily.    PROLIA 60 MG/ML SOSY injection Inject 60 mg into the skin every 6 (six) months.    QUEtiapine (SEROQUEL) 25 MG tablet Take 0.5 tablets (12.5 mg total) by mouth every evening.    simvastatin (ZOCOR) 20 MG tablet Take 1 tablet (20 mg total) by mouth daily at 6 PM.    bisacodyl (DULCOLAX) 5 MG EC tablet Take 2 tablets (10 mg total) by mouth daily as needed for moderate constipation. (Patient not taking: Reported on 05/17/2023) 05/16/2023: Daily   melatonin 5 MG TABS Take 1 tablet (5 mg total) by mouth at bedtime. (Patient not taking: Reported on 05/17/2023)  traZODone (DESYREL) 50 MG tablet Take 1 tablet (50 mg total) by mouth at bedtime as needed for sleep. (Patient not taking: Reported on 05/17/2023)    No facility-administered encounter medications on file as of 05/17/2023.  Per Hospitalist: "Acute CVA -Small acute infarct in the right corona radiata as seen on the MRI brain.  No large vessel occlusion was noted.  LDL 132, A1c 6.0.  Seen by neurology.  Echocardiogram showed EF of 60 to 65%.  Aspirin, Plavix was recommended for 21 days followed by Plavix alone.  Crestor 20 mg daily was started.  PT/OT has recommended SNF.     Leukocytosis likely reactive Mild urinary tract infection On chronic Macrobid     Hypovolemic hyponatremia -Improved.  Sodium 131    Memory loss Likely vascular in nature given extensive small vessel disease on previous MRIs.   HTN  (hypertension) - Blood pressure currently stable.    Chronic kidney disease, stage 3a Renal function currently at baseline.   Monitor renal function   Hypothyroidism - Continue home Synthroid   Constipation started MiraLAX twice daily, Dulcolax as needed.    Vitamin B12 level 230, goal >400,vitamin B12 1000 mcg p.o. daily, repeat vitamin B12 level after 3 to 6 months.   Chronic osteoporosis patient on 6 monthly injection of prolia.  Last injection was January 07, 2023"  Diagnostic Tests Reviewed:  CLINICAL DATA:  Code stroke.  Left-sided weakness   EXAM: CT HEAD WITHOUT CONTRAST   TECHNIQUE: Contiguous axial images were obtained from the base of the skull through the vertex without intravenous contrast.   RADIATION DOSE REDUCTION: This exam was performed according to the departmental dose-optimization program which includes automated exposure control, adjustment of the mA and/or kV according to patient size and/or use of iterative reconstruction technique.   COMPARISON:  MRI Brain 09/25/20   FINDINGS: Brain: No hemorrhage. Sequela of moderate chronic microvascular ischemic change with a chronic right frontal lobe infarct. No CT evidence of an acute cortical infarct. No hydrocephalus. No extra-axial fluid collection   Vascular: Calcification in the right sylvian fissure (series 120 is unchanged prior.   Skull: Normal. Negative for fracture or focal lesion.2   Sinuses/Orbits: No middle ear or mastoid effusion. Paranasal sinuses are clear. Bilateral lens replacement. Orbits are otherwise unremarkable   Other: None   ASPECTS ([place] Stroke Program Early CT Score): 10   IMPRESSION: No hemorrhage or CT evidence of an acute cortical infarct. Aspects is 10 when accounting for chronic findings.  CLINICAL DATA:  Neuro deficit, acute, stroke suspected. Left leg weakness and facial droop.   EXAM: MRI HEAD WITHOUT CONTRAST   MRA HEAD WITHOUT CONTRAST   MRA NECK  WITHOUT AND WITH CONTRAST   TECHNIQUE: Multiplanar, multi-echo pulse sequences of the brain and surrounding structures were acquired without intravenous contrast. Angiographic images of the Circle of Willis were acquired using MRA technique without intravenous contrast. Angiographic images of the neck were acquired using MRA technique without and with intravenous contrast. Carotid stenosis measurements (when applicable) are obtained utilizing NASCET criteria, using the distal internal carotid diameter as the denominator.   CONTRAST:  7.50mL GADAVIST GADOBUTROL 1 MMOL/ML IV SOLN   COMPARISON:  Head CT 04/06/2023.  Head MRI 09/25/2020.   FINDINGS: MRI HEAD FINDINGS   Brain: There is a 1 cm acute infarct in the right corona radiata. Patchy and confluent T2 hyperintensities elsewhere in the cerebral white matter bilaterally are unchanged from the prior MRI and are nonspecific but compatible with extensive chronic  small vessel ischemic disease. Small chronic infarcts are again seen involving right frontal cortex and right corona radiata. There is moderate cerebral atrophy. No intracranial hemorrhage, mass, midline shift, or extra-axial fluid collection is identified.   Vascular: Major intracranial vascular flow voids are preserved.   Skull and upper cervical spine: Unremarkable bone marrow signal.   Sinuses/Orbits: Bilateral cataract extraction. No significant inflammatory changes in the paranasal sinuses. Moderate chronic right mastoid effusion.   Other: None.   MRA HEAD FINDINGS   Anterior circulation: The internal carotid arteries are patent from skull base to carotid termini without evidence of a significant stenosis. ACAs and MCAs are patent with mild branch vessel irregularity but no evidence of a proximal branch occlusion or significant proximal stenosis. No aneurysm is identified.   Posterior circulation: The intracranial vertebral arteries are patent to the basilar  with the left being dominant. Patent PICA and SCA origins are seen bilaterally. The basilar artery is widely patent. Posterior communicating arteries are diminutive or absent. The PCAs are patent proximally. There is a mild-to-moderate stenosis of the right PCA near the P1-P2 junction, and there are severe P3 stenoses bilaterally. No aneurysm is identified.   Anatomic variants: None.   MRA NECK FINDINGS   The study is mildly to moderately motion degraded.   Aortic arch: Standard 3 vessel aortic arch. Patent brachiocephalic and subclavian arteries with potential bilateral subclavian artery stenoses versus motion artifact.   Right carotid system: Patent with motion artifact limiting assessment for stenosis in the proximal common carotid artery and at the carotid bifurcation. No suspected flow limiting stenosis of the ICA.   Left carotid system: Patent with motion artifact limiting assessment for stenosis in the proximal common carotid artery and at the carotid bifurcation. No suspected flow limiting stenosis of the ICA.   Vertebral arteries: Patent with the left being dominant and with antegrade flow bilaterally. Limited assessment of the left vertebral origin due to motion. No evidence of a significant stenosis or dissection elsewhere on either side.   IMPRESSION: 1. Small acute infarct in the right corona radiata. 2. Extensive chronic small vessel ischemic disease. 3. Small chronic right frontal cortical infarcts. 4. No large vessel occlusion. 5. Intracranial atherosclerosis most notably involving the PCAs. No significant proximal stenosis in the anterior intracranial circulation. 6. Motion degraded neck MRA as detailed above, including limited assessment of the carotid bifurcations and left vertebral artery origin. No suspected flow limiting cervical ICA stenosis, however carotid doppler ultrasound or CTA could be performed for further evaluation if clinically  warranted.  Disposition: SNF  Consults: Neurology  Discharge Instructions:  Follow up with PCP in 1-2 weeks Please obtain BMP/CBC in one week your next doctors visit.  Aspirin, Plavix was recommended for 21 days followed by Plavix alone.  Crestor 20 mg daily was started.   Outpatient Neurology follow up in 3-4 weeks On Chronic Macrobid for UTIs  Disease/illness Education: Discussed today  Home Health/Community Services Discussions/Referrals: In place  Establishment or re-establishment of referral orders for community resources: In place  Discussion with other health care providers: N/A  Assessment and Support of treatment regimen adherence: Fair  Appointments Coordinated with: Patient and daughter  Education for self-management, independent living, and ADLs: Discussed today  Since getting out of the SNF, Alivia has been "fine." Still weak on the L side. Her family is concerned about her moving around with out her walker. SHe notes that she does not seem to be concerned about safety.   Saw neurology about  3 weeks ago and cardiology yesterday. Cardiology note not available for review, but to have a loop recorder. Neurology is Continuing with PT and OT and her current medicines.   Mood is down. Family is with her all the time.   Relevant past medical, surgical, family and social history reviewed and updated as indicated. Interim medical history since our last visit reviewed. Allergies and medications reviewed and updated.  Review of Systems  Per HPI unless specifically indicated above '    Objective:    BP 108/73   Pulse 77   Temp 98.4 F (36.9 C) (Oral)   Ht 4\' 8"  (1.422 m)   Wt 165 lb 9.6 oz (75.1 kg)   SpO2 98%   BMI 37.13 kg/m   Wt Readings from Last 3 Encounters:  05/17/23 165 lb 9.6 oz (75.1 kg)  05/16/23 166 lb 4 oz (75.4 kg)  04/06/23 158 lb 15.2 oz (72.1 kg)    Physical Exam Vitals and nursing note reviewed.  Constitutional:      General: She is not  in acute distress.    Appearance: Normal appearance. She is not ill-appearing, toxic-appearing or diaphoretic.  HENT:     Head: Normocephalic and atraumatic.     Right Ear: External ear normal.     Left Ear: External ear normal.     Nose: Nose normal.     Mouth/Throat:     Mouth: Mucous membranes are moist.     Pharynx: Oropharynx is clear.  Eyes:     General: No scleral icterus.       Right eye: No discharge.        Left eye: No discharge.     Extraocular Movements: Extraocular movements intact.     Conjunctiva/sclera: Conjunctivae normal.     Pupils: Pupils are equal, round, and reactive to light.  Cardiovascular:     Rate and Rhythm: Normal rate and regular rhythm.     Pulses: Normal pulses.     Heart sounds: Normal heart sounds. No murmur heard.    No friction rub. No gallop.  Pulmonary:     Effort: Pulmonary effort is normal. No respiratory distress.     Breath sounds: Normal breath sounds. No stridor. No wheezing, rhonchi or rales.  Chest:     Chest wall: No tenderness.  Musculoskeletal:        General: Normal range of motion.     Cervical back: Normal range of motion and neck supple.  Skin:    General: Skin is warm and dry.     Capillary Refill: Capillary refill takes less than 2 seconds.     Coloration: Skin is not jaundiced or pale.     Findings: No bruising, erythema, lesion or rash.  Neurological:     General: No focal deficit present.     Mental Status: She is alert and oriented to person, place, and time. Mental status is at baseline.     Motor: Weakness (L side) present.  Psychiatric:        Mood and Affect: Mood normal.        Behavior: Behavior normal.        Thought Content: Thought content normal.        Judgment: Judgment normal.     Results for orders placed or performed in visit on 05/04/23  Basic metabolic panel  Result Value Ref Range   Glucose 145    BUN 12 4 - 21   CO2 25 (A) 13 - 22  Creatinine 0.9 0.5 - 1.1   Potassium 5.8 (A) 3.5 -  5.1 mEq/L   Sodium 134 (A) 137 - 147   Chloride 103 99 - 108  Comprehensive metabolic panel  Result Value Ref Range   eGFR 61    Calcium 8.5 (A) 8.7 - 10.7   Albumin 3.4 (A) 3.5 - 5.0  Hepatic function panel  Result Value Ref Range   Alkaline Phosphatase 66 25 - 125   ALT 18 7 - 35 U/L   AST 26 13 - 35   Bilirubin, Total 0.3       Assessment & Plan:   Problem List Items Addressed This Visit   None    Follow up plan: No follow-ups on file.

## 2023-05-17 NOTE — Telephone Encounter (Signed)
-----   Message from Dorcas Carrow, DO sent at 05/17/2023  3:23 PM EDT ----- Handicap placard please

## 2023-05-17 NOTE — Telephone Encounter (Signed)
Handicap Placard was completed and placed in provider's folder for provider's signature.

## 2023-05-18 ENCOUNTER — Encounter: Payer: Self-pay | Admitting: Family Medicine

## 2023-05-18 LAB — LIPID PANEL W/O CHOL/HDL RATIO
Cholesterol, Total: 157 mg/dL (ref 100–199)
HDL: 51 mg/dL (ref >39)
LDL Chol Calc (NIH): 78 mg/dL (ref 0–99)
Triglycerides: 167 mg/dL — ABNORMAL HIGH (ref 0–149)
VLDL Cholesterol Cal: 28 mg/dL (ref 5–40)

## 2023-05-18 LAB — COMPREHENSIVE METABOLIC PANEL
ALT: 18 IU/L (ref 0–32)
AST: 16 IU/L (ref 0–40)
Albumin: 3.9 g/dL (ref 3.6–4.6)
Alkaline Phosphatase: 73 IU/L (ref 44–121)
BUN/Creatinine Ratio: 8 — ABNORMAL LOW (ref 12–28)
BUN: 8 mg/dL — ABNORMAL LOW (ref 10–36)
Bilirubin Total: 0.3 mg/dL (ref 0.0–1.2)
CO2: 24 mmol/L (ref 20–29)
Calcium: 9.5 mg/dL (ref 8.7–10.3)
Chloride: 99 mmol/L (ref 96–106)
Creatinine, Ser: 0.95 mg/dL (ref 0.57–1.00)
Globulin, Total: 3.2 g/dL (ref 1.5–4.5)
Glucose: 91 mg/dL (ref 70–99)
Potassium: 4.5 mmol/L (ref 3.5–5.2)
Sodium: 136 mmol/L (ref 134–144)
Total Protein: 7.1 g/dL (ref 6.0–8.5)
eGFR: 57 mL/min/{1.73_m2} — ABNORMAL LOW (ref >59)

## 2023-05-18 LAB — CBC WITH DIFFERENTIAL/PLATELET
Basophils Absolute: 0.1 10*3/uL (ref 0.0–0.2)
Basos: 1 %
EOS (ABSOLUTE): 0.4 10*3/uL (ref 0.0–0.4)
Eos: 3 %
Hematocrit: 37.2 % (ref 34.0–46.6)
Hemoglobin: 12.6 g/dL (ref 11.1–15.9)
Immature Grans (Abs): 0 10*3/uL (ref 0.0–0.1)
Immature Granulocytes: 0 %
Lymphocytes Absolute: 3.3 10*3/uL — ABNORMAL HIGH (ref 0.7–3.1)
Lymphs: 32 %
MCH: 31.3 pg (ref 26.6–33.0)
MCHC: 33.9 g/dL (ref 31.5–35.7)
MCV: 93 fL (ref 79–97)
Monocytes Absolute: 1 10*3/uL — ABNORMAL HIGH (ref 0.1–0.9)
Monocytes: 10 %
Neutrophils Absolute: 5.5 10*3/uL (ref 1.4–7.0)
Neutrophils: 54 %
Platelets: 322 10*3/uL (ref 150–450)
RBC: 4.02 x10E6/uL (ref 3.77–5.28)
RDW: 13.3 % (ref 11.7–15.4)
WBC: 10.3 10*3/uL (ref 3.4–10.8)

## 2023-05-19 ENCOUNTER — Encounter: Payer: Self-pay | Admitting: Family Medicine

## 2023-05-19 DIAGNOSIS — G8194 Hemiplegia, unspecified affecting left nondominant side: Secondary | ICD-10-CM | POA: Insufficient documentation

## 2023-05-19 NOTE — Assessment & Plan Note (Signed)
Under good control on current regimen. Continue current regimen. Continue to monitor. Call with any concerns. Refills up to date. Labs drawn today.  

## 2023-05-19 NOTE — Assessment & Plan Note (Signed)
Continue to keep BP and cholesterol under good control. Continue to follow with cardiology and neurology. Continue PT and OT. Call with any concerns. Continue to monitor.

## 2023-05-19 NOTE — Assessment & Plan Note (Signed)
Unable to get clear image on MRA. Will check [place]. Await results. Treat as needed.

## 2023-05-19 NOTE — Assessment & Plan Note (Signed)
Will keep BP and cholesterol under good control. Continue to monitor. Call with any concerns.  

## 2023-05-19 NOTE — Assessment & Plan Note (Signed)
Continue to work with PT and OT. Call with any concerns. Continue to follow with neurology. Continue to monitor.

## 2023-05-20 ENCOUNTER — Telehealth: Payer: Self-pay | Admitting: Family Medicine

## 2023-05-20 NOTE — Telephone Encounter (Signed)
Home Health Verbal Orders - Caller/Agency: Clydie Braun from [facility] Number: [phone] Requesting Speech Therapy Frequency: 1x6

## 2023-05-20 NOTE — Telephone Encounter (Signed)
OK for verbal orders?

## 2023-05-20 NOTE — Telephone Encounter (Signed)
Spoke with Clydie Braun from [employer] providing verbal OK orders for the patient. Clydie Braun verbalized understanding.

## 2023-05-24 ENCOUNTER — Ambulatory Visit: Payer: Medicare Other | Attending: Internal Medicine | Admitting: Internal Medicine

## 2023-05-24 ENCOUNTER — Encounter: Payer: Self-pay | Admitting: Internal Medicine

## 2023-05-24 VITALS — BP 100/68 | HR 89 | Ht <= 58 in | Wt 165.2 lb

## 2023-05-24 DIAGNOSIS — I6930 Unspecified sequelae of cerebral infarction: Secondary | ICD-10-CM

## 2023-05-24 NOTE — Patient Instructions (Signed)
Medication Instructions:  Your physician recommends that you continue on your current medications as directed. Please refer to the Current Medication list given to you today.  *If you need a refill on your cardiac medications before your next appointment, please call your pharmacy*   Lab Work: None ordered.  If you have labs (blood work) drawn today and your tests are completely normal, you will receive your results only by: MyChart Message (if you have MyChart) OR A paper copy in the mail If you have any lab test that is abnormal or we need to change your treatment, we will call you to review the results.   Testing/Procedures: None ordered.    Follow-Up: At [facility], you and your health needs are our priority.  As part of our continuing mission to provide you with exceptional heart care, we have created designated Provider Care Teams.  These Care Teams include your primary Cardiologist (physician) and Advanced Practice Providers (APPs -  Physician Assistants and Nurse Practitioners) who all work together to provide you with the care you need, when you need it.  We recommend signing up for the patient portal called "MyChart".  Sign up information is provided on this After Visit Summary.  MyChart is used to connect with patients for Virtual Visits (Telemedicine).  Patients are able to view lab/test results, encounter notes, upcoming appointments, etc.  Non-urgent messages can be sent to your provider as well.   To learn more about what you can do with MyChart, go to [email or web address].    Your next appointment:   Follow up with Dr Klein as needed 

## 2023-05-24 NOTE — Progress Notes (Signed)
Sarah Freeman      ELECTROPHYSIOLOGY CONSULT NOTE  Patient ID: Sarah Freeman, MRN: [id number], DOB/AGE: 1931-11-29 89 y.o. Admit date: (Not on file) Date of Consult: 05/24/2023  Primary Physician: Dorcas Carrow, DO Primary Cardiologist: Margaretann Loveless is a 89 y.o. female who is being seen today for the evaluation of consideration of a loop recorder at the request of Sarah Freeman/CF.    HPI Sarah Freeman is a 89 y.o. female seen in consultation for recurrent stroke. Previously seen with  ILR 2017 for stroke and recurrence 2019; No detected AF.  Recurrent stroke 2024  Imaging >> No acute but extensive chronic small vessel disease   Ambulates around the house and in the driveway no complaints of shortness of breath.  No edema.  Denies chest pain.  Does get out sometimes.  No obvious lightheadedness DATE TEST EF   5/24 Echo   60-65 %         Date Cr K Hgb  6/24 0.95 4.5 12.6            Past Medical History:  Diagnosis Date   Hyperlipidemia    Hypothyroid    Stroke [facility])    Urinary incontinence       Surgical History:  Past Surgical History:  Procedure Laterality Date   BREAST BIOPSY Left 2000?   neg. dr. [place] Nail office   CHOLECYSTECTOMY  1964   COLONOSCOPY     Dr Maryruth Bun   DILATION AND CURETTAGE OF UTERUS     EYE SURGERY     LOOP RECORDER INSERTION N/A 02/09/2018   Procedure: LOOP RECORDER INSERTION;  Surgeon: Duke Salvia, MD;  Location: [facility] INVASIVE CV LAB;  Service: Cardiovascular;  Laterality: N/A;   SKIN CANCER EXCISION     face   TEE WITHOUT CARDIOVERSION N/A 02/09/2018   Procedure: TRANSESOPHAGEAL ECHOCARDIOGRAM (TEE);  Surgeon: Antonieta Iba, MD;  Location: [facility] ORS;  Service: Cardiovascular;  Laterality: N/A;   TOTAL ABDOMINAL HYSTERECTOMY W/ BILATERAL SALPINGOOPHORECTOMY     age 36     Home Meds: Current Meds  Medication Sig   aspirin EC 81 MG tablet Take 81 mg by mouth daily. Swallow whole.   cholecalciferol (VITAMIN D3) 25 MCG (1000 UNIT)  tablet Take 1,000 Units by mouth daily.   clopidogrel (PLAVIX) 75 MG tablet Take 1 tablet (75 mg total) by mouth daily.   cyanocobalamin 1000 MCG tablet Take 1 tablet (1,000 mcg total) by mouth daily.   donepezil (ARICEPT) 5 MG tablet Take 1 tablet (5 mg total) by mouth at bedtime.   gabapentin (NEURONTIN) 100 MG capsule TAKE 2 CAPSULE(100 MG) BY MOUTH IN THE AM and 1 AT PM (Patient taking differently: 2 mg at bedtime. TAKE 2 CAPSULE(100 MG) BY MOUTH IN THE AM and 1 AT PM)   levothyroxine (SYNTHROID) 50 MCG tablet Take 1 tablet (50 mcg total) by mouth daily before breakfast.   nitrofurantoin, macrocrystal-monohydrate, (MACROBID) 100 MG capsule Take 1 capsule (100 mg total) by mouth at bedtime.   pantoprazole (PROTONIX) 40 MG tablet Take 1 tablet (40 mg total) by mouth daily.   polyethylene glycol (MIRALAX / GLYCOLAX) 17 g packet Take 17 g by mouth daily.   PROLIA 60 MG/ML SOSY injection Inject 60 mg into the skin every 6 (six) months.   QUEtiapine (SEROQUEL) 25 MG tablet Take 0.5-1 tablets (12.5-25 mg total) by mouth every evening.   simvastatin (ZOCOR) 20 MG tablet Take 1 tablet (20 mg total) by  mouth daily at 6 PM.    Allergies:  Allergies  Allergen Reactions   Statins Other (See Comments)    Leg cramps Leg cramps   Bacitracin    Neomycin    Neosporin + Pain Relief Max St  [Neomy-Bacit-Polymyx-Pramoxine]    Polymyxin B    Neomycin-Polymyxin-Gramicidin Other (See Comments)    Pt states wounds do not heal with neosporin Pt states wounds do not heal with neosporin    Social History   Socioeconomic History   Marital status: Widowed    Spouse name: Not on file   Number of children: Not on file   Years of education: Not on file   Highest education level: Not on file  Occupational History   Occupation: retired  Tobacco Use   Smoking status: Never    Passive exposure: Never   Smokeless tobacco: Never  Vaping Use   Vaping Use: Never used  Substance and Sexual Activity   Alcohol  use: No    Alcohol/week: 0.0 standard drinks of alcohol   Drug use: No   Sexual activity: Not Currently  Other Topics Concern   Not on file  Social History Narrative   Not on file   Social Determinants of Health   Financial Resource Strain: Low Risk  (02/01/2023)   Overall Financial Resource Strain (CARDIA)    Difficulty of Paying Living Expenses: Not hard at all  Food Insecurity: No Food Insecurity (04/06/2023)   Hunger Vital Sign    Worried About Running Out of Food in the Last Year: Never true    Ran Out of Food in the Last Year: Never true  Transportation Needs: No Transportation Needs (04/06/2023)   PRAPARE - Administrator, Civil Service (Medical): No    Lack of Transportation (Non-Medical): No  Physical Activity: Insufficiently Active (02/01/2023)   Exercise Vital Sign    Days of Exercise per Week: 4 days    Minutes of Exercise per Session: 10 min  Stress: No Stress Concern Present (02/01/2023)   [employer] of Occupational Health - Occupational Stress Questionnaire    Feeling of Stress : Not at all  Social Connections: Moderately Isolated (02/01/2023)   Social Connection and Isolation Panel [NHANES]    Frequency of Communication with Friends and Family: More than three times a week    Frequency of Social Gatherings with Friends and Family: More than three times a week    Attends Religious Services: More than 4 times per year    Active Member of [employer] or Organizations: No    Attends Banker Meetings: Never    Marital Status: Widowed  Intimate Partner Violence: Not At Risk (04/06/2023)   Humiliation, Afraid, Rape, and Kick questionnaire    Fear of Current or Ex-Partner: No    Emotionally Abused: No    Physically Abused: No    Sexually Abused: No     Family History  Problem Relation Age of Onset   Cancer Father        prostate   Stroke Father    Hypertension Sister    Hyperlipidemia Daughter    Heart disease Son    Diabetes Maternal  Grandmother    Heart disease Maternal Grandmother    Heart disease Maternal Grandfather    Heart disease Daughter    CAD Mother    Failure to thrive Mother    Breast cancer Neg Hx      ROS:  Please see the history of present illness.  All other systems reviewed and negative.    Physical Exam: Blood pressure 100/68, pulse 89, height 4\' 8"  (1.422 m), weight 165 lb 3.2 oz (74.9 kg), SpO2 97 %. General: Well developed, well nourished female in no acute distress. Head: Normocephalic, atraumatic, sclera non-icteric, no xanthomas, nares are without discharge. EENT: normal  Lymph Nodes:  none Neck: Negative for carotid bruits. JVD 8-10 cm Back:without scoliosis kyphosis Lungs: Crackles bilaterally did not clear with coughing Heart: RRR with S1 S2. No  murmur . No rubs, or gallops appreciated. Abdomen: Soft, non-tender, non-distended with normoactive bowel sounds. No hepatomegaly. No rebound/guarding. No obvious abdominal masses. Msk:  Strength and tone appear normal for age. Extremities: No clubbing or cyanosis.  Trace edema.  Distal pedal pulses are 2+ and equal bilaterally. Skin: Warm and Dry Neuro: Alert and oriented X1 . CN III-XII intact Grossly normal sensory and motor function . Psych:  Responds to questions appropriately with a normal affect.        EKG: Sinus at 89 Intervals 19/07/36 Poor R wave progression   Assessment and Plan:  Strokes-recurrent  Cerebrovascular disease  Blood pressure-low side  Volume overload/HFpEF  Dementia   The patient has had recurrent strokes in the context of cerebrovascular disease.  She is on DAPT and I would recommend that that be continued per the recommendations of neurology.  I do not think that there is an indication for loop recorder.  For the first, she had a loop recorder implanted and had recurrent strokes without atrial fibrillation.  For the second, the data supporting the use of anticoagulation in patients in whom SCAF is  identified does not yet exist, i.e. the hypothesis is unproven.  With her volume overload, could use a diuretic but I think given her low blood pressure the greater risk to her health is a fall and her fluid does not seem to be limiting her functionally.  We will see her again as needed   Sherryl Manges

## 2023-05-26 ENCOUNTER — Ambulatory Visit: Payer: Medicare Other | Admitting: Cardiology

## 2023-05-27 ENCOUNTER — Encounter: Payer: Self-pay | Admitting: Family Medicine

## 2023-05-30 ENCOUNTER — Other Ambulatory Visit: Payer: Medicare Other

## 2023-05-30 NOTE — Patient Outreach (Addendum)
Medicaid Managed Care Social Work Note  05/30/2023 Name:  Sarah Freeman MRN:  [id number] DOB:  07-Sep-1931  Sarah Freeman is an 89 y.o. year old female who is a primary patient of Dorcas Carrow, DO.  The [facility] Managed Care Coordination team was consulted for assistance with:  Level of Care Concerns  [name] was given information about Medicaid Managed Care Coordination team services today. Sarah Freeman Primary Caregiver agreed to services and verbal consent obtained.  Engaged with patient  for by telephone forinitial visit in response to referral for case management and/or care coordination services.   Assessments/Interventions:  Review of past medical history, allergies, medications, health status, including review of consultants reports, laboratory and other test data, was performed as part of comprehensive evaluation and provision of chronic care management services.  SDOH: (Social Determinant of Health) assessments and interventions performed: SDOH Interventions    Flowsheet Row Clinical Support from 02/01/2023 in [facility] Clinical Support from 01/18/2022 in [facility]  SDOH Interventions    Food Insecurity Interventions Intervention Not Indicated Intervention Not Indicated  Housing Interventions Intervention Not Indicated Intervention Not Indicated  Transportation Interventions Intervention Not Indicated Intervention Not Indicated  Utilities Interventions Intervention Not Indicated --  Alcohol Usage Interventions Intervention Not Indicated (Score <7) --  Financial Strain Interventions Intervention Not Indicated Intervention Not Indicated  Physical Activity Interventions Intervention Not Indicated Intervention Not Indicated  Stress Interventions Intervention Not Indicated Intervention Not Indicated  Social Connections Interventions Intervention Not Indicated Intervention Not Indicated     BSW completed a telephone  outreach with patient daughter, Patient currently receieves 24 hour care, but insurance only pays for 8 hours a month. Daughter wants to know if they could be reimbursted for more hours monthly through insurance. Patient has [facility] medicare. BSW will research to see if patient could get more hours. BSW contacted [facility] and was informed to contact Fransico Him, contacted [employer] at [phone] and they informed BSW to contact [facility]. [employer] stated patients PCP would need to submit for additional hours and why. BSW also contacted [employer], BSW was on hold for over 30 mintues.   Advanced Directives Status:  Not addressed in this encounter.  Care Plan                 Allergies  Allergen Reactions   Statins Other (See Comments)    Leg cramps Leg cramps   Bacitracin    Neomycin    Neosporin + Pain Relief Max St  [Neomy-Bacit-Polymyx-Pramoxine]    Polymyxin B    Neomycin-Polymyxin-Gramicidin Other (See Comments)    Pt states wounds do not heal with neosporin Pt states wounds do not heal with neosporin    Medications Reviewed Today     Reviewed by Mariam Dollar, CMA (Certified Medical Assistant) on 05/24/23 at 1012  Med List Status: <None>   Medication Order Taking? Sig Documenting Provider Last Dose Status Informant  aspirin EC 81 MG tablet [id number] Yes Take 81 mg by mouth daily. Swallow whole. [provider] Taking Active   cholecalciferol (VITAMIN D3) 25 MCG (1000 UNIT) tablet [id number] Yes Take 1,000 Units by mouth daily. [provider] Taking Active Child  clopidogrel (PLAVIX) 75 MG tablet [id number] Yes Take 1 tablet (75 mg total) by mouth daily. Olevia Perches P, DO Taking Active Child  cyanocobalamin 1000 MCG tablet [id number] Yes Take 1 tablet (1,000 mcg total) by mouth daily. Dimple Nanas, MD Taking Active   donepezil (  ARICEPT) 5 MG tablet [id number] Yes Take 1 tablet (5 mg total) by mouth at bedtime. Olevia Perches P, DO Taking Active Child  gabapentin (NEURONTIN)  100 MG capsule [id number] Yes TAKE 2 CAPSULE(100 MG) BY MOUTH IN THE AM and 1 AT PM  Patient taking differently: 2 mg at bedtime. TAKE 2 CAPSULE(100 MG) BY MOUTH IN THE AM and 1 AT PM   Johnson, [place], DO Taking Active Child           Med Note Sharia Reeve   Wed Apr 06, 2023  1:54 PM) Patient takes 200 mg once daily  levothyroxine (SYNTHROID) 50 MCG tablet [id number] Yes Take 1 tablet (50 mcg total) by mouth daily before breakfast. Dorcas Carrow, DO Taking Active Child  nitrofurantoin, macrocrystal-monohydrate, (MACROBID) 100 MG capsule [id number] Yes Take 1 capsule (100 mg total) by mouth at bedtime. Alfredo Martinez, MD Taking Active Child  pantoprazole (PROTONIX) 40 MG tablet [id number] Yes Take 1 tablet (40 mg total) by mouth daily. Johnson, Megan P, DO Taking Active   polyethylene glycol (MIRALAX / GLYCOLAX) 17 g packet [id number] Yes Take 17 g by mouth daily. [provider] Taking Active Child  PROLIA 60 MG/ML SOSY injection [id number] Yes Inject 60 mg into the skin every 6 (six) months. [provider] Taking Active Child  QUEtiapine (SEROQUEL) 25 MG tablet [id number] Yes Take 0.5-1 tablets (12.5-25 mg total) by mouth every evening. Johnson, Megan P, DO Taking Active   simvastatin (ZOCOR) 20 MG tablet [id number] Yes Take 1 tablet (20 mg total) by mouth daily at 6 PM. Dorcas Carrow, DO Taking Active             Patient Active Problem List   Diagnosis Date Noted   Left hemiplegia (HCC) 05/19/2023   History of CVA with residual deficit 05/17/2023   Dyslipidemia 04/06/2023   Memory loss 12/29/2021   Aortic atherosclerosis (HCC) 02/27/2021   SVT (supraventricular tachycardia) 10/11/2018   HTN (hypertension) 10/11/2018   Chronic kidney disease, stage 3 (HCC) 05/24/2018   Morbid obesity (HCC) 02/02/2018   Carotid stenosis 02/01/2018   Left arm weakness 01/18/2018   Chronic UTI 11/04/2017   Statin intolerance 06/14/2017   Counseling regarding advanced  directives and goals of care 02/22/2017   Weakness of left leg 02/25/2016   TIA (transient ischemic attack) 02/15/2016   Osteopenia 01/29/2016   Menopausal state 01/29/2016   Hypothyroidism 01/29/2016   GERD (gastroesophageal reflux disease) 01/29/2016   Hypercholesterolemia 01/29/2016   Lumbar spinal stenosis 01/29/2016   Stress incontinence 01/29/2016   Breast microcalcification, mammographic 06/17/2015    Conditions to be addressed/monitored per PCP order:   level of care  There are no care plans that you recently modified to display for this patient.   Follow up:  Patient agrees to Care Plan and Follow-up.  Plan: The Managed Medicaid care management team will reach out to the patient again over the next 30 days.  Abelino Derrick, MHA [facility]  Managed [facility] Social Worker [phone]

## 2023-05-30 NOTE — Patient Instructions (Signed)
Visit Information  The Primary Caregiver was given information about Medicaid Managed Care team care coordination services and consented to engagement with the [facility] Managed Care team.   Social Worker will follow up.   Abelino Derrick, MHA [facility]  Managed [facility] Social Worker [phone]

## 2023-06-01 ENCOUNTER — Telehealth: Payer: Self-pay

## 2023-06-01 NOTE — Telephone Encounter (Signed)
Copied from CRM [id number]. Topic: General - Other >> Jun 01, 2023  1:22 PM Everette C wrote: Reason for CRM: Marissa with [facility] has called regarding assistance regarding a diagnosis for a patient   Ashok Cordia would like to further discuss dx concerns related to the patient's walking and gait struggles  Please contact further when possible

## 2023-06-03 ENCOUNTER — Ambulatory Visit: Payer: Medicare Other | Admitting: Medical

## 2023-06-06 ENCOUNTER — Inpatient Hospital Stay (HOSPITAL_COMMUNITY): Payer: Medicare Other

## 2023-06-06 ENCOUNTER — Other Ambulatory Visit: Payer: Self-pay

## 2023-06-06 ENCOUNTER — Encounter (HOSPITAL_COMMUNITY): Payer: Self-pay

## 2023-06-06 ENCOUNTER — Emergency Department (HOSPITAL_COMMUNITY): Payer: Medicare Other

## 2023-06-06 ENCOUNTER — Inpatient Hospital Stay (HOSPITAL_COMMUNITY)
Admission: EM | Admit: 2023-06-06 | Discharge: 2023-06-09 | DRG: 065 | Disposition: A | Discharge status: Skilled Nursing Facility | Payer: Medicare Other | Attending: Internal Medicine | Admitting: Internal Medicine

## 2023-06-06 DIAGNOSIS — Z8673 Personal history of transient ischemic attack (TIA), and cerebral infarction without residual deficits: Secondary | ICD-10-CM

## 2023-06-06 DIAGNOSIS — Z8249 Family history of ischemic heart disease and other diseases of the circulatory system: Secondary | ICD-10-CM | POA: Diagnosis not present

## 2023-06-06 DIAGNOSIS — E039 Hypothyroidism, unspecified: Secondary | ICD-10-CM | POA: Diagnosis present

## 2023-06-06 DIAGNOSIS — Z6838 Body mass index (BMI) 38.0-38.9, adult: Secondary | ICD-10-CM | POA: Diagnosis not present

## 2023-06-06 DIAGNOSIS — R2990 Unspecified symptoms and signs involving the nervous system: Secondary | ICD-10-CM

## 2023-06-06 DIAGNOSIS — N390 Urinary tract infection, site not specified: Secondary | ICD-10-CM | POA: Diagnosis present

## 2023-06-06 DIAGNOSIS — G9349 Other encephalopathy: Secondary | ICD-10-CM | POA: Diagnosis present

## 2023-06-06 DIAGNOSIS — W1830XA Fall on same level, unspecified, initial encounter: Secondary | ICD-10-CM | POA: Diagnosis present

## 2023-06-06 DIAGNOSIS — I700 Atherosclerosis of aorta: Secondary | ICD-10-CM | POA: Diagnosis present

## 2023-06-06 DIAGNOSIS — E785 Hyperlipidemia, unspecified: Secondary | ICD-10-CM | POA: Diagnosis present

## 2023-06-06 DIAGNOSIS — I6381 Other cerebral infarction due to occlusion or stenosis of small artery: Secondary | ICD-10-CM | POA: Diagnosis not present

## 2023-06-06 DIAGNOSIS — R569 Unspecified convulsions: Secondary | ICD-10-CM | POA: Diagnosis not present

## 2023-06-06 DIAGNOSIS — Z66 Do not resuscitate: Secondary | ICD-10-CM | POA: Diagnosis present

## 2023-06-06 DIAGNOSIS — F0150 Vascular dementia without behavioral disturbance: Secondary | ICD-10-CM | POA: Diagnosis present

## 2023-06-06 DIAGNOSIS — R29810 Facial weakness: Secondary | ICD-10-CM | POA: Diagnosis present

## 2023-06-06 DIAGNOSIS — Z79890 Hormone replacement therapy (postmenopausal): Secondary | ICD-10-CM

## 2023-06-06 DIAGNOSIS — E669 Obesity, unspecified: Secondary | ICD-10-CM | POA: Diagnosis present

## 2023-06-06 DIAGNOSIS — I639 Cerebral infarction, unspecified: Secondary | ICD-10-CM

## 2023-06-06 DIAGNOSIS — H518 Other specified disorders of binocular movement: Secondary | ICD-10-CM | POA: Diagnosis present

## 2023-06-06 DIAGNOSIS — Z833 Family history of diabetes mellitus: Secondary | ICD-10-CM

## 2023-06-06 DIAGNOSIS — Z789 Other specified health status: Secondary | ICD-10-CM | POA: Diagnosis present

## 2023-06-06 DIAGNOSIS — N183 Chronic kidney disease, stage 3 unspecified: Secondary | ICD-10-CM | POA: Diagnosis present

## 2023-06-06 DIAGNOSIS — Z888 Allergy status to other drugs, medicaments and biological substances status: Secondary | ICD-10-CM

## 2023-06-06 DIAGNOSIS — Z7902 Long term (current) use of antithrombotics/antiplatelets: Secondary | ICD-10-CM

## 2023-06-06 DIAGNOSIS — Z7982 Long term (current) use of aspirin: Secondary | ICD-10-CM

## 2023-06-06 DIAGNOSIS — Z9109 Other allergy status, other than to drugs and biological substances: Secondary | ICD-10-CM

## 2023-06-06 DIAGNOSIS — Z823 Family history of stroke: Secondary | ICD-10-CM

## 2023-06-06 DIAGNOSIS — Z90710 Acquired absence of both cervix and uterus: Secondary | ICD-10-CM

## 2023-06-06 DIAGNOSIS — R29714 NIHSS score 14: Secondary | ICD-10-CM | POA: Diagnosis present

## 2023-06-06 DIAGNOSIS — G8194 Hemiplegia, unspecified affecting left nondominant side: Secondary | ICD-10-CM | POA: Diagnosis present

## 2023-06-06 DIAGNOSIS — Z85828 Personal history of other malignant neoplasm of skin: Secondary | ICD-10-CM | POA: Diagnosis not present

## 2023-06-06 DIAGNOSIS — N1831 Chronic kidney disease, stage 3a: Secondary | ICD-10-CM | POA: Diagnosis present

## 2023-06-06 DIAGNOSIS — I129 Hypertensive chronic kidney disease with stage 1 through stage 4 chronic kidney disease, or unspecified chronic kidney disease: Secondary | ICD-10-CM | POA: Diagnosis present

## 2023-06-06 DIAGNOSIS — K219 Gastro-esophageal reflux disease without esophagitis: Secondary | ICD-10-CM | POA: Diagnosis present

## 2023-06-06 DIAGNOSIS — I6930 Unspecified sequelae of cerebral infarction: Secondary | ICD-10-CM

## 2023-06-06 DIAGNOSIS — R32 Unspecified urinary incontinence: Secondary | ICD-10-CM | POA: Diagnosis present

## 2023-06-06 DIAGNOSIS — R471 Dysarthria and anarthria: Secondary | ICD-10-CM | POA: Diagnosis present

## 2023-06-06 DIAGNOSIS — Z79899 Other long term (current) drug therapy: Secondary | ICD-10-CM

## 2023-06-06 LAB — URINALYSIS, ROUTINE W REFLEX MICROSCOPIC
Bilirubin Urine: NEGATIVE
Glucose, UA: NEGATIVE mg/dL
Hgb urine dipstick: NEGATIVE
Ketones, ur: NEGATIVE mg/dL
Leukocytes,Ua: NEGATIVE
Nitrite: NEGATIVE
Protein, ur: NEGATIVE mg/dL
Specific Gravity, Urine: 1.036 — ABNORMAL HIGH (ref 1.005–1.030)
pH: 7 (ref 5.0–8.0)

## 2023-06-06 LAB — CBC
HCT: 36.8 % (ref 36.0–46.0)
Hemoglobin: 12 g/dL (ref 12.0–15.0)
MCH: 31.2 pg (ref 26.0–34.0)
MCHC: 32.6 g/dL (ref 30.0–36.0)
MCV: 95.6 fL (ref 80.0–100.0)
Platelets: 268 10*3/uL (ref 150–400)
RBC: 3.85 MIL/uL — ABNORMAL LOW (ref 3.87–5.11)
RDW: 13.7 % (ref 11.5–15.5)
WBC: 7.7 10*3/uL (ref 4.0–10.5)
nRBC: 0 % (ref 0.0–0.2)

## 2023-06-06 LAB — COMPREHENSIVE METABOLIC PANEL
ALT: 38 U/L (ref 0–44)
AST: 43 U/L — ABNORMAL HIGH (ref 15–41)
Albumin: 2.9 g/dL — ABNORMAL LOW (ref 3.5–5.0)
Alkaline Phosphatase: 51 U/L (ref 38–126)
Anion gap: 10 (ref 5–15)
BUN: 9 mg/dL (ref 8–23)
CO2: 25 mmol/L (ref 22–32)
Calcium: 8.8 mg/dL — ABNORMAL LOW (ref 8.9–10.3)
Chloride: 102 mmol/L (ref 98–111)
Creatinine, Ser: 0.97 mg/dL (ref 0.44–1.00)
GFR, Estimated: 56 mL/min — ABNORMAL LOW (ref >60)
Glucose, Bld: 101 mg/dL — ABNORMAL HIGH (ref 70–99)
Potassium: 3.8 mmol/L (ref 3.5–5.1)
Sodium: 137 mmol/L (ref 135–145)
Total Bilirubin: 0.5 mg/dL (ref 0.3–1.2)
Total Protein: 6.4 g/dL — ABNORMAL LOW (ref 6.5–8.1)

## 2023-06-06 LAB — DIFFERENTIAL
Abs Immature Granulocytes: 0.01 10*3/uL (ref 0.00–0.07)
Basophils Absolute: 0.1 10*3/uL (ref 0.0–0.1)
Basophils Relative: 1 %
Eosinophils Absolute: 0.5 10*3/uL (ref 0.0–0.5)
Eosinophils Relative: 6 %
Immature Granulocytes: 0 %
Lymphocytes Relative: 38 %
Lymphs Abs: 2.9 10*3/uL (ref 0.7–4.0)
Monocytes Absolute: 0.9 10*3/uL (ref 0.1–1.0)
Monocytes Relative: 12 %
Neutro Abs: 3.3 10*3/uL (ref 1.7–7.7)
Neutrophils Relative %: 43 %

## 2023-06-06 LAB — I-STAT CHEM 8, ED
BUN: 9 mg/dL (ref 8–23)
Calcium, Ion: 1.17 mmol/L (ref 1.15–1.40)
Chloride: 103 mmol/L (ref 98–111)
Creatinine, Ser: 1 mg/dL (ref 0.44–1.00)
Glucose, Bld: 98 mg/dL (ref 70–99)
HCT: 37 % (ref 36.0–46.0)
Hemoglobin: 12.6 g/dL (ref 12.0–15.0)
Potassium: 3.9 mmol/L (ref 3.5–5.1)
Sodium: 138 mmol/L (ref 135–145)
TCO2: 25 mmol/L (ref 22–32)

## 2023-06-06 LAB — PROTIME-INR
INR: 1 (ref 0.8–1.2)
Prothrombin Time: 13.8 seconds (ref 11.4–15.2)

## 2023-06-06 LAB — CBG MONITORING, ED: Glucose-Capillary: 87 mg/dL (ref 70–99)

## 2023-06-06 LAB — RAPID URINE DRUG SCREEN, HOSP PERFORMED
Amphetamines: NOT DETECTED
Barbiturates: NOT DETECTED
Benzodiazepines: NOT DETECTED
Cocaine: NOT DETECTED
Opiates: NOT DETECTED
Tetrahydrocannabinol: NOT DETECTED

## 2023-06-06 LAB — APTT: aPTT: 26 seconds (ref 24–36)

## 2023-06-06 LAB — ETHANOL: Alcohol, Ethyl (B): <10 mg/dL (ref <10)

## 2023-06-06 LAB — GLUCOSE, CAPILLARY
Glucose-Capillary: 105 mg/dL — ABNORMAL HIGH (ref 70–99)
Glucose-Capillary: 98 mg/dL (ref 70–99)

## 2023-06-06 MED ORDER — GABAPENTIN 100 MG PO CAPS
200.0000 mg | ORAL_CAPSULE | Freq: Every day | ORAL | Status: DC
  Administered 2023-06-07 – 2023-06-09 (×3): 200 mg via ORAL
  Filled 2023-06-06 (×3): qty 2

## 2023-06-06 MED ORDER — DONEPEZIL HCL 10 MG PO TABS
5.0000 mg | ORAL_TABLET | Freq: Every day | ORAL | Status: DC
  Administered 2023-06-07 – 2023-06-09 (×3): 5 mg via ORAL
  Filled 2023-06-06 (×3): qty 1

## 2023-06-06 MED ORDER — ACETAMINOPHEN 650 MG RE SUPP: 650.0000 mg | RECTAL | Status: DC | PRN

## 2023-06-06 MED ORDER — SODIUM CHLORIDE 0.9 % IV SOLN: INTRAVENOUS | Status: AC

## 2023-06-06 MED ORDER — SIMVASTATIN 20 MG PO TABS
20.0000 mg | ORAL_TABLET | Freq: Every day | ORAL | Status: DC
  Administered 2023-06-07 – 2023-06-09 (×3): 20 mg via ORAL
  Filled 2023-06-06 (×3): qty 1

## 2023-06-06 MED ORDER — TICAGRELOR 90 MG PO TABS
90.0000 mg | ORAL_TABLET | Freq: Two times a day (BID) | ORAL | Status: DC
  Administered 2023-06-06 – 2023-06-09 (×6): 90 mg via ORAL
  Filled 2023-06-06 (×6): qty 1

## 2023-06-06 MED ORDER — STROKE: EARLY STAGES OF RECOVERY BOOK
Freq: Once | Status: AC
  Filled 2023-06-06: qty 1

## 2023-06-06 MED ORDER — ONDANSETRON HCL 4 MG/2ML IJ SOLN: 4.0000 mg | Freq: Three times a day (TID) | INTRAMUSCULAR | Status: DC | PRN

## 2023-06-06 MED ORDER — NITROFURANTOIN MONOHYD MACRO 100 MG PO CAPS
100.0000 mg | ORAL_CAPSULE | Freq: Every day | ORAL | Status: DC
  Administered 2023-06-06 – 2023-06-08 (×3): 100 mg via ORAL
  Filled 2023-06-06 (×4): qty 1

## 2023-06-06 MED ORDER — POLYETHYLENE GLYCOL 3350 17 G PO PACK
17.0000 g | PACK | Freq: Every day | ORAL | Status: DC
  Filled 2023-06-06: qty 1

## 2023-06-06 MED ORDER — SODIUM CHLORIDE 0.9 % IV SOLN
2000.0000 mg | Freq: Once | INTRAVENOUS | Status: AC
  Administered 2023-06-06: 2000 mg via INTRAVENOUS
  Filled 2023-06-06: qty 20

## 2023-06-06 MED ORDER — LEVOTHYROXINE SODIUM 50 MCG PO TABS
50.0000 ug | ORAL_TABLET | Freq: Every day | ORAL | Status: DC
  Administered 2023-06-07 – 2023-06-09 (×3): 50 ug via ORAL
  Filled 2023-06-06 (×3): qty 1

## 2023-06-06 MED ORDER — ENOXAPARIN SODIUM 40 MG/0.4ML IJ SOSY
40.0000 mg | PREFILLED_SYRINGE | INTRAMUSCULAR | Status: DC
  Administered 2023-06-06 – 2023-06-09 (×4): 40 mg via SUBCUTANEOUS
  Filled 2023-06-06 (×4): qty 0.4

## 2023-06-06 MED ORDER — ROSUVASTATIN CALCIUM 5 MG PO TABS: 5.0000 mg | ORAL_TABLET | Freq: Every day | ORAL | Status: DC

## 2023-06-06 MED ORDER — ASPIRIN 81 MG PO CHEW: 81.0000 mg | CHEWABLE_TABLET | Freq: Every day | ORAL | Status: DC

## 2023-06-06 MED ORDER — PANTOPRAZOLE SODIUM 40 MG IV SOLR
40.0000 mg | INTRAVENOUS | Status: DC
  Administered 2023-06-06 – 2023-06-09 (×4): 40 mg via INTRAVENOUS
  Filled 2023-06-06 (×4): qty 10

## 2023-06-06 MED ORDER — MELATONIN 3 MG PO TABS
3.0000 mg | ORAL_TABLET | Freq: Every evening | ORAL | Status: DC | PRN
  Administered 2023-06-06: 3 mg via ORAL
  Filled 2023-06-06: qty 1

## 2023-06-06 MED ORDER — ACETAMINOPHEN 325 MG PO TABS: 650.0000 mg | ORAL_TABLET | ORAL | Status: DC | PRN

## 2023-06-06 MED ORDER — ACETAMINOPHEN 160 MG/5ML PO SOLN: 650.0000 mg | ORAL | Status: DC | PRN

## 2023-06-06 MED ORDER — QUETIAPINE FUMARATE 25 MG PO TABS
25.0000 mg | ORAL_TABLET | Freq: Every day | ORAL | Status: DC
  Administered 2023-06-06 – 2023-06-08 (×3): 25 mg via ORAL
  Filled 2023-06-06 (×3): qty 1

## 2023-06-06 MED ORDER — IOHEXOL 350 MG/ML SOLN
115.0000 mL | Freq: Once | INTRAVENOUS | Status: AC | PRN
  Administered 2023-06-06: 115 mL via INTRAVENOUS

## 2023-06-06 MED ORDER — ASPIRIN 300 MG RE SUPP
300.0000 mg | Freq: Every day | RECTAL | Status: DC
  Administered 2023-06-06 – 2023-06-08 (×3): 300 mg via RECTAL
  Filled 2023-06-06 (×3): qty 1

## 2023-06-06 NOTE — Code Documentation (Addendum)
Responded to Code Stroke called at 0428 for R sided gaze, L sided deficits, and slurred speech, LSN-2110 last night. Pt arrived at 0443, CBG-87, NIH-14, CT head negative for acute changes. TNK not given-pt outside window. CTA/CTP-no LVO. Plan: EEG, Keppra, MRI, VS/neuro checks q2h x 12, then q4h.

## 2023-06-06 NOTE — ED Notes (Signed)
Pt family concerned that pt is holding her urine and will not use the purewick. Family requesting a catheter. RN informed family will reach out to MD to see if I/O is needed.

## 2023-06-06 NOTE — Progress Notes (Addendum)
STROKE TEAM PROGRESS NOTE   INTERVAL HISTORY Her family is at the bedside.  Patient is drowsy has eyes closed, will open eyes to voice.  She is alert and oriented to self, age, place, and people in the room.  Pupils are equal and reactive, has a right gaze cannot cross midline, visual fields appear to be full to finger count.  She will follow commands, moderate dysarthria, no aphasia, slight paucity of speech.  She has a left facial droop, tongue deviates to the left.  Right upper extremity 5 out of 5, left upper extremity 2 out of 5, right lower extremity 4 out of 5 left lower extremity 3 out of 5.  Sensation appears to be intact no ataxia  She got up in the middle of the night to use the bathroom and she called out as she fell on the floor and her caregiver found her with left-sided weakness and right gaze deviation. MRI brain during acute infarct in right basal ganglia.  She was here about 2 months ago with a small acute infarct in the right corona radiata and was placed on DAPT therapy for 3 months.  She was also seen by cardiology 05/24/2023 for consideration of a loop recorder however has had a loop recorder in the past with no A-fib detected and loop recorder was not recommended  We will change her antiplatelet to be to 81 mg aspirin and Brilinta 90 mg twice daily for 1 month and then Plavix 75 mg daily for monotherapy.  Also recommend keeping her blood pressure between 1 30-1 40 due to carotid stenosis  Vitals:   06/06/23 0926 06/06/23 1100 06/06/23 1325 06/06/23 1434  BP: 129/74 137/71 (!) 153/65   Pulse: 61 (!) 57 66   Resp: 16 16 20    Temp: 97.6 F (36.4 C)   97.6 F (36.4 C)  TempSrc: Axillary   Axillary  SpO2: 100% 98% 100%   Weight:      Height:       CBC:  Recent Labs  Lab 06/06/23 0447 06/06/23 0450  WBC 7.7  --   NEUTROABS 3.3  --   HGB 12.0 12.6  HCT 36.8 37.0  MCV 95.6  --   PLT 268  --    Basic Metabolic Panel:  Recent Labs  Lab 06/06/23 0447 06/06/23 0450   NA 137 138  K 3.8 3.9  CL 102 103  CO2 25  --   GLUCOSE 101* 98  BUN 9 9  CREATININE 0.97 1.00  CALCIUM 8.8*  --    Lipid Panel: No results for input(s): "CHOL", "TRIG", "HDL", "CHOLHDL", "VLDL", "LDLCALC" in the last 168 hours. HgbA1c: No results for input(s): "HGBA1C" in the last 168 hours. Urine Drug Screen: No results for input(s): "LABOPIA", "COCAINSCRNUR", "LABBENZ", "AMPHETMU", "THCU", "LABBARB" in the last 168 hours.  Alcohol Level  Recent Labs  Lab 06/06/23 0447  ETH <10    IMAGING past 24 hours EEG adult  Result Date: 06/06/2023 Sarah Quest, MD     06/06/2023  3:54 PM Patient Name: Sarah Freeman MRN: [id number] Epilepsy Attending: Charlsie Freeman Referring Physician/Provider: Caryl Pina, MD Date: 06/06/2023 Duration: 22.43 mins Patient history: 89yo F left sided weakness and R gaze deviation. EEG to evaluate for seizure Level of alertness: Awake AEDs during EEG study: GBP Technical aspects: This EEG study was done with scalp electrodes positioned according to the 10-20 International system of electrode placement. Electrical activity was reviewed with band pass filter of 1-70Hz ,  sensitivity of 7 uV/mm, display speed of 42mm/sec with a 60Hz  notched filter applied as appropriate. EEG data were recorded continuously and digitally stored.  Video monitoring was available and reviewed as appropriate. Description: The posterior dominant rhythm consists of 7.5 Hz activity of moderate voltage (25-35 uV) seen predominantly in posterior head regions, symmetric and reactive to eye opening and eye closing. EEG showed intermittent generalized 3 to 6 Hz theta-delta slowing. Hyperventilation and photic stimulation were not performed.   ABNORMALITY - Intermittent slow, generalized IMPRESSION: This study is suggestive of mild diffuse encephalopathy, nonspecific etiology. No seizures or epileptiform discharges were seen throughout the recording. Sarah Freeman   DG Swallowing Func-Speech  Pathology  Result Date: 06/06/2023 Table formatting from the original result was not included. Modified Barium Swallow Study Patient Details Name: Sarah Freeman MRN: [id number] Date of Birth: 12/09/1930 Today's Date: 06/06/2023 HPI/PMH: HPI: Sarah Freeman is a 89 y.o. female who presented to [facility] via EMS as a Code Stroke with left sided weakness and R gaze deviation, after getting up to go to the bathroom in the middle of the night and falling.  MRI 7/8: "Acute infarct in the deep right cerebral white matter affecting  external capsule and corona radiata. Small separate right frontal  and parietal white matter infarcts."  Pt with medical history significant of dementia, prior CVA in 2017, hypertension, hyperlipidemia, bilateral carotid artery stenosis, hypothyroidism, stage III CKD, GERD, aortic atherosclerosis, and recent hospitalization in May 2024 and found to have a small acute infarct in the right corona radiata Clinical Impression: Clinical Impression: Pt presents with an overall functional oropharyngeal swallow. Generalized oral weakness and acute L sided asymmetry contribute to instances of anterior spillage and trace residue, which lines the tongue and eventually clears with subsequent swallows. Pt had one instance of sensed aspiration during the initial teaspoonful of thin liquids that was independently ejected with a cough (PAS 6) in which the bolus spilled over the arytenoids during the swallow prior to complete closure of the laryngeal vestibule. No other instances of penetration or aspiration occurred with subsequent challenging sips of thin liquids, nectar thick liquids, honey thick liquids, or purees. When cued to take a bite of the graham cracker with purees, pt only took the puree; therefore solids were unable to be tested today. The pill was administered with thin liquids via straw and passed without difficulty. Pt may benefit from continued SLP f/u to address potential to upgrade diet and  target functional compensatory strategies. Recommend initiating diet of Dys 1 textures with thin liquids and meds given whole with liquids as mentation allows (or crushed in puree PRN). Recommend full superivison during meal times. Factors that may increase risk of adverse event in presence of aspiration Sarah Freeman): Factors that may increase risk of adverse event in presence of aspiration Sarah Freeman): Poor general health and/or compromised immunity; Reduced cognitive function; Limited mobility; Frail or deconditioned Recommendations/Plan: Swallowing Evaluation Recommendations Swallowing Evaluation Recommendations Recommendations: PO diet PO Diet Recommendation: Dysphagia 1 (Pureed); Thin liquids (Level 0) Liquid Administration via: Straw; Cup Medication Administration: Whole meds with liquid Supervision: Full supervision/cueing for swallowing strategies Swallowing strategies  : Minimize environmental distractions; Slow rate; Small bites/sips; Check for anterior loss Postural changes: Position pt fully upright for meals Oral care recommendations: Oral care BID (2x/day) Treatment Plan Treatment Plan Treatment recommendations: Therapy as outlined in treatment plan below Follow-up recommendations: Skilled nursing-short term rehab (<3 hours/day) Functional status assessment: Patient has had a recent  decline in their functional status and demonstrates the ability to make significant improvements in function in a reasonable and predictable amount of time. Treatment frequency: Min 2x/week Treatment duration: 2 weeks Interventions: Aspiration precaution training; Compensatory techniques; Patient/family education; Trials of upgraded texture/liquids; Diet toleration management by SLP Recommendations Recommendations for follow up therapy are one component of a multi-disciplinary discharge planning process, led by the attending physician.  Recommendations may be updated based on patient status,  additional functional criteria and insurance authorization. Assessment: Orofacial Exam: Orofacial Exam Oral Cavity: Oral Hygiene: WFL Oral Cavity - Dentition: Dentures, top; Dentures, bottom Orofacial Anatomy: Other (comment) (L sided assymetry) Oral Motor/Sensory Function: Generalized oral weakness Anatomy: Anatomy: Suspected cervical osteophytes Boluses Administered: Boluses Administered Boluses Administered: Thin liquids (Level 0); Mildly thick liquids (Level 2, nectar thick); Moderately thick liquids (Level 3, honey thick); Puree  Oral Impairment Domain: Oral Impairment Domain Lip Closure: Escape progressing to mid-chin Tongue control during bolus hold: Not tested Bolus preparation/mastication: -- (unable to test solids) Bolus transport/lingual motion: Brisk tongue motion Oral residue: Trace residue lining oral structures Location of oral residue : Tongue Initiation of pharyngeal swallow : Pyriform sinuses  Pharyngeal Impairment Domain: Pharyngeal Impairment Domain Soft palate elevation: No bolus between soft palate (SP)/pharyngeal wall (PW) Laryngeal elevation: Complete superior movement of thyroid cartilage with complete approximation of arytenoids to epiglottic petiole Anterior hyoid excursion: Partial anterior movement Epiglottic movement: Partial inversion Laryngeal vestibule closure: Incomplete, narrow column air/contrast in laryngeal vestibule Pharyngeal stripping wave : Present - diminished Pharyngeal contraction (A/P view only): N/A Pharyngoesophageal segment opening: Complete distension and complete duration, no obstruction of flow Tongue base retraction: Trace column of contrast or air between tongue base and PPW Pharyngeal residue: Complete pharyngeal clearance Location of pharyngeal residue: N/A  Esophageal Impairment Domain: Esophageal Impairment Domain Esophageal clearance upright position: Complete clearance, esophageal coating Pill: Pill Consistency administered: Thin liquids (Level 0) Thin  liquids (Level 0): [facility] Penetration/Aspiration Scale Score: Penetration/Aspiration Scale Score 6.  Material enters airway, passes BELOW cords then ejected out: Thin liquids (Level 0) Compensatory Strategies: Compensatory Strategies Compensatory strategies: Yes Straw: Effective   General Information: Caregiver present: Yes  Diet Prior to this Study: NPO   Temperature : Normal   Respiratory Status: WFL   Supplemental O2: None (Room air)   History of Recent Intubation: No  Behavior/Cognition: Alert; Cooperative; Requires cueing Self-Feeding Abilities: Needs assist with self-feeding Baseline vocal quality/speech: Normal Volitional Cough: Able to elicit Volitional Swallow: Able to elicit Exam Limitations: No limitations Goal Planning: Prognosis for improved oropharyngeal function: Good Barriers to Reach Goals: Cognitive deficits No data recorded Patient/Family Stated Goal: wants potatoes Consulted and agree with results and recommendations: Patient; Family member/caregiver Pain: Pain Assessment Pain Assessment: Faces Faces Pain Scale: 0 End of Session: Start Time:SLP Start Time (ACUTE ONLY): 1343 Stop Time: SLP Stop Time (ACUTE ONLY): 1401 Time Calculation:SLP Time Calculation (min) (ACUTE ONLY): 18 min Charges: SLP Evaluations $ SLP Speech Visit: 1 Visit SLP Evaluations $BSS Swallow: 1 Procedure $MBS Swallow: 1 Procedure SLP visit diagnosis: SLP Visit Diagnosis: Dysphagia, unspecified (R13.10) Past Medical History: Past Medical History: Diagnosis Date  Hyperlipidemia   Hypothyroid   Stroke [facility])   Urinary incontinence  Past Surgical History: Past Surgical History: Procedure Laterality Date  BREAST BIOPSY Left 2000?  neg. dr. Rutherford Nail office  CHOLECYSTECTOMY  1964  COLONOSCOPY    Dr Maryruth Bun  DILATION AND CURETTAGE OF UTERUS    EYE SURGERY    LOOP RECORDER INSERTION N/A 02/09/2018  Procedure: LOOP RECORDER  INSERTION;  Surgeon: Duke Salvia, MD;  Location: [facility] INVASIVE CV LAB;  Service: Cardiovascular;  Laterality: N/A;   SKIN CANCER EXCISION    face  TEE WITHOUT CARDIOVERSION N/A 02/09/2018  Procedure: TRANSESOPHAGEAL ECHOCARDIOGRAM (TEE);  Surgeon: Antonieta Iba, MD;  Location: [facility] ORS;  Service: Cardiovascular;  Laterality: N/A;  TOTAL ABDOMINAL HYSTERECTOMY W/ BILATERAL SALPINGOOPHORECTOMY    age 8 Gwynneth Aliment, M.A., CF-SLP Speech Language Pathology, Acute Rehabilitation Services Secure Chat preferred [phone] 06/06/2023, 3:05 PM  MR BRAIN WO CONTRAST  Result Date: 06/06/2023 CLINICAL DATA:  Neuro deficit with acute stroke suspected EXAM: MRI HEAD WITHOUT CONTRAST TECHNIQUE: Multiplanar, multiecho pulse sequences of the brain and surrounding structures were obtained without intravenous contrast. COMPARISON:  Head CT and CTA from earlier today. Brain MRI 04/06/2023 FINDINGS: Brain: Confluent restricted diffusion in the right corona radiata and external capsule. Separate area of small restricted diffusion in the right superior frontal and parietal white matter. Background of extensive chronic small vessel ischemia. Small chronic right cerebral cortex infarcts. No hemorrhage, hydrocephalus, or collection. Vascular: Major flow voids are preserved.  There was preceding CTA. Skull and upper cervical spine: Normal marrow signal Sinuses/Orbits: Bilateral cataract resection. Partial right mastoid opacification. IMPRESSION: 1. Acute infarct in the deep right cerebral white matter affecting external capsule and corona radiata. Small separate right frontal and parietal white matter infarcts. 2. Extensive chronic small vessel disease and small chronic right frontal cortex infarcts. Electronically Signed   By: Tiburcio Pea M.D.   On: 06/06/2023 08:03   CT ANGIO HEAD NECK W WO CM (CODE STROKE)  Result Date: 06/06/2023 CLINICAL DATA:  Code stroke.  Left-sided weakness EXAM: CT ANGIOGRAPHY HEAD AND NECK CT PERFUSION BRAIN TECHNIQUE: Multidetector CT imaging of the head and neck was performed using the standard protocol during  bolus administration of intravenous contrast. Multiplanar CT image reconstructions and MIPs were obtained to evaluate the vascular anatomy. Carotid stenosis measurements (when applicable) are obtained utilizing NASCET criteria, using the distal internal carotid diameter as the denominator. Multiphase CT imaging of the brain was performed following IV bolus contrast injection. Subsequent parametric perfusion maps were calculated using RAPID software. RADIATION DOSE REDUCTION: This exam was performed according to the departmental dose-optimization program which includes automated exposure control, adjustment of the mA and/or kV according to patient size and/or use of iterative reconstruction technique. CONTRAST:  OMNIPAQUE IOHEXOL 350 MG/ML SOLN COMPARISON:  MRA of the head and neck 04/06/2023 FINDINGS: CTA NECK FINDINGS Aortic arch: Atheromatous plaque with 3 vessel branching. No acute finding or dilatation Right carotid system: Mixed density plaque primarily at the bifurcation. Proximal ICA stenosis measures 50% on coronal reformats. No ulceration or dissection. Left carotid system: Calcified plaque at the bifurcation without stenosis or ulceration. Vertebral arteries: Proximal subclavian atherosclerosis without flow reducing stenosis. The left vertebral artery is dominant the smoothly contoured, and widely patent. Skeleton: Generalized cervical spine degeneration with multilevel anterolisthesis. Other neck: No acute finding Upper chest: No acute finding Review of the MIP images confirms the above findings CTA HEAD FINDINGS Anterior circulation: Atheromatous calcification of the carotid siphons. No branch occlusion, beading, or aneurysm. No proximal flow limiting stenosis. Posterior circulation: Left dominant vertebral artery. The vertebral and basilar arteries are widely patent. High-grade narrowing at the left PCA bifurcation although no major branch occlusion. No beading or aneurysm Venous sinuses:  Unremarkable for the arterial phase Anatomic variants: None significant Review of the MIP images confirms the above findings CT Brain Perfusion Findings: ASPECTS: 10 CBF (<30%)  Volume: 0mL Perfusion (Tmax>6.0s) volume: 0mL With very strict time parameters there is relatively delayed perfusion in the right MCA upper division and watershed territories. IMPRESSION: 1. No emergent large vessel occlusion. 2. No infarct or ischemia by standard threshold CT perfusion. With very strict time parameters there is relatively delayed perfusion to the upper division right MCA territory and watershed which may be related to a 50-55% right ICA origin stenosis. 3. High-grade left PCA branch narrowing. Electronically Signed   By: Tiburcio Pea M.D.   On: 06/06/2023 05:27   CT CEREBRAL PERFUSION W CONTRAST  Result Date: 06/06/2023 CLINICAL DATA:  Code stroke.  Left-sided weakness EXAM: CT ANGIOGRAPHY HEAD AND NECK CT PERFUSION BRAIN TECHNIQUE: Multidetector CT imaging of the head and neck was performed using the standard protocol during bolus administration of intravenous contrast. Multiplanar CT image reconstructions and MIPs were obtained to evaluate the vascular anatomy. Carotid stenosis measurements (when applicable) are obtained utilizing NASCET criteria, using the distal internal carotid diameter as the denominator. Multiphase CT imaging of the brain was performed following IV bolus contrast injection. Subsequent parametric perfusion maps were calculated using RAPID software. RADIATION DOSE REDUCTION: This exam was performed according to the departmental dose-optimization program which includes automated exposure control, adjustment of the mA and/or kV according to patient size and/or use of iterative reconstruction technique. CONTRAST:  OMNIPAQUE IOHEXOL 350 MG/ML SOLN COMPARISON:  MRA of the head and neck 04/06/2023 FINDINGS: CTA NECK FINDINGS Aortic arch: Atheromatous plaque with 3 vessel branching. No acute  finding or dilatation Right carotid system: Mixed density plaque primarily at the bifurcation. Proximal ICA stenosis measures 50% on coronal reformats. No ulceration or dissection. Left carotid system: Calcified plaque at the bifurcation without stenosis or ulceration. Vertebral arteries: Proximal subclavian atherosclerosis without flow reducing stenosis. The left vertebral artery is dominant the smoothly contoured, and widely patent. Skeleton: Generalized cervical spine degeneration with multilevel anterolisthesis. Other neck: No acute finding Upper chest: No acute finding Review of the MIP images confirms the above findings CTA HEAD FINDINGS Anterior circulation: Atheromatous calcification of the carotid siphons. No branch occlusion, beading, or aneurysm. No proximal flow limiting stenosis. Posterior circulation: Left dominant vertebral artery. The vertebral and basilar arteries are widely patent. High-grade narrowing at the left PCA bifurcation although no major branch occlusion. No beading or aneurysm Venous sinuses: Unremarkable for the arterial phase Anatomic variants: None significant Review of the MIP images confirms the above findings CT Brain Perfusion Findings: ASPECTS: 10 CBF (<30%) Volume: 0mL Perfusion (Tmax>6.0s) volume: 0mL With very strict time parameters there is relatively delayed perfusion in the right MCA upper division and watershed territories. IMPRESSION: 1. No emergent large vessel occlusion. 2. No infarct or ischemia by standard threshold CT perfusion. With very strict time parameters there is relatively delayed perfusion to the upper division right MCA territory and watershed which may be related to a 50-55% right ICA origin stenosis. 3. High-grade left PCA branch narrowing. Electronically Signed   By: Tiburcio Pea M.D.   On: 06/06/2023 05:27   CT HEAD CODE STROKE WO CONTRAST  Result Date: 06/06/2023 CLINICAL DATA:  Code stroke.  Left-sided weakness EXAM: CT HEAD WITHOUT CONTRAST  TECHNIQUE: Contiguous axial images were obtained from the base of the skull through the vertex without intravenous contrast. RADIATION DOSE REDUCTION: This exam was performed according to the departmental dose-optimization program which includes automated exposure control, adjustment of the mA and/or kV according to patient size and/or use of iterative reconstruction technique. COMPARISON:  Head CT  04/06/2023 FINDINGS: Brain: No evidence of acute infarction, hemorrhage, hydrocephalus, extra-axial collection or mass lesion/mass effect. Small patchy areas of chronic cortically based infarct along the superior right frontal lobe best seen on sagittal reformats. Generalized cerebral volume loss and chronic small vessel ischemia. Vascular: No hyperdense vessel or unexpected calcification. Right MCA branch region calcification is chronic. Skull: Normal. Negative for fracture or focal lesion. Sinuses/Orbits: Preferential gaze to the right Other: Discussed with Dr. Ezzie Dural while in progress ASPECTS [facility] Stroke Program Early CT Score) - Ganglionic level infarction (caudate, lentiform nuclei, internal capsule, insula, M1-M3 cortex): 7 - Supraganglionic infarction (M4-M6 cortex): 3 Total score (0-10 with 10 being normal): 10 IMPRESSION: 1. No acute finding. 2. Atrophy and chronic small vessel ischemia with small chronic right frontal cortex infarcts. Electronically Signed   By: Tiburcio Pea M.D.   On: 06/06/2023 05:18    PHYSICAL EXAM  Temp:  [97.6 F (36.4 C)-98 F (36.7 C)] 97.6 F (36.4 C) (07/08 1434) Pulse Rate:  [57-73] 66 (07/08 1325) Resp:  [11-20] 20 (07/08 1325) BP: (127-153)/(63-80) 153/65 (07/08 1325) SpO2:  [79 %-100 %] 100 % (07/08 1325) Weight:  [77.4 kg] 77.4 kg (07/08 0400)  General - Well nourished, well developed, in no apparent distress. Cardiovascular - Regular rhythm and rate.  Mental Status -  Patient is drowsy has eyes closed, will open eyes to voice.  She is alert and oriented to  self, age, place, and people in the room.  Pupils are equal and reactive, has a right gaze cannot cross midline, visual fields appear to be full to finger count.  She will follow commands, moderate dysarthria, no aphasia, slight paucity of speech.  She has a left facial droop, tongue deviates to the left.  Right upper extremity 5 out of 5, left upper extremity 2 out of 5, right lower extremity 4 out of 5 left lower extremity 3 out of 5.  Sensation appears to be intact no ataxia  ASSESSMENT/PLAN Ms. KINESHA DROZD is a 89 y.o. female with history of HTN, Hypothyroidism, prior CVA and dementia presenting with  left side weakness and right gaze deviation. She had fallen when she got up to go to the bathroom and hollered out, caretaker founs her on the floor with above symptoms  Stroke:  Acute right CR and parietal periventricular WM ischemic infarcts  Etiology:  small vs. large vessel disease   CT head No acute abnormality. Small vessel disease. ASPECTS 10. Right MCA branch region calcification is chronic   CTA head & neck No LVO, Proximal R ICA stenosis measures 50% on coronal reformats.  CT perfusion  no infarct or ischemia  MRI  acute right external capsule and corona radiata 2D Echo  (5/9) EF 60-65%. LV with grade I diastolic dysfunction EEG mild diffuse encephalopathy LDL 78 HgbA1c 6.0 VTE prophylaxis -Lovenox aspirin 81 mg daily and clopidogrel 75 mg daily prior to admission, now on aspirin 81 mg and Brilinta 90 mg twice daily for 30 days then Plavix 75 mg daily for monotherapy. May consider OCEANIC trial Therapy recommendations:  pending  Disposition:  pending   Hx of stroke 01/2016 presented with left-sided numbness and weakness.  MRI showed many right cortical and subcortical small infarcts.  Carotid Doppler negative.  EF 70%, A1c 5.8, LDL 150.  Discharged on Plavix. 12/2017 admitted for inability to control of left upper extremity.  MRI showed a few small acute infarct in the right  parietal deep white matter.  Concerning for embolic source.  Carotid Doppler negative.  EF 60 to 65%.  A1c 6.1, LDL 88.  TEE EF 55 to 60%.  Loop recorder placed For 2 years of loop recorder monitoring, no A-fib found. 03/2023 admitted for left leg weakness.  MRI showed small acute infarct in the right CR.  MRA head and neck intracranial atherosclerosis involving bilateral PCAs.  Discharged on DAPT for 3 months and then aspirin alone, as well as Lipitor 80.  Hypertension Home meds:  none Stable Per family, patient baseline BP at home 90s to 110s Avoid low BP, avoid dehydration Long-term BP goal normotensive  Hyperlipidemia Home meds:  simvastatin 20, resumed in hospital LDL 78, goal < 70 No high intensity statin given advanced age and LDL not far from goal. Continue Zocor 20 at discharge  Other Stroke Risk Factors Advanced Age >/= 47  Obesity, Body mass index is 38.26 kg/m., BMI >/= 30 associated with increased stroke risk, recommend weight loss, diet and exercise as appropriate   Other Active Problems Hypothyroidism  Hospital day # 0  Gevena Mart DNP, ACNPC-AG  [facility]  ATTENDING NOTE: I reviewed above note and agree with the assessment and plan. Pt was seen and examined.   Son and daughter are at the bedside. Pt sleepy but open eyes on voice, orientated to age, place and people, but not to time. No aphasia but paucity of speech, moderated dysarthria and following all simple commands. Able to name and repeat in dysarthric voice2/2. Right gaze preference, barely cross midline. However, visual field full, PERRL. Left facial droop. Tongue protrusion to the left. RUE 5/5, LUE 2+/5. RLE 4/5 and LLE 3/5. Sensation symmetrical bilaterally, right FTN intact although slow, gait not tested.  Pt has hx of stroke in the past (above), the most recent stroke was in 03/2023. Current stroke more like extension of last stroke, concerning for hypotensive at home. Per daughter, pt BP at  home 90s to 110s. Could be from dehydration. With hydration in ED, pt BP 130s. No afib with 2 year loop monitoring. Was on DAPT PTA, now given new stroke will do ASA and brilinta for one month and then plavix alone. Continue home zocor. May consider OCEANIC trial, will talk to family. PT and OT pending. Will follow.   For detailed assessment and plan, please refer to above/below as I have made changes wherever appropriate.   I spent additional 30 inpt minutes in total face-to-face time with the patient, more than 50% of which was spent in counseling and coordination of care, reviewing test results, images and medication, and discussing the diagnosis, treatment plan and potential prognosis. This patient's care requiresreview of multiple databases, neurological assessment, discussion with family, other specialists and medical decision making of high complexity.   Marvel Plan, MD PhD Stroke Neurology 06/06/2023 10:36 PM    To contact Stroke Continuity provider, please refer to [email or web address]. After hours, contact General Neurology

## 2023-06-06 NOTE — Progress Notes (Signed)
EEG complete - results pending 

## 2023-06-06 NOTE — Procedures (Addendum)
Patient Name: Sarah Freeman  MRN: [id number]  Epilepsy Attending: Charlsie Quest  Referring Physician/Provider: Caryl Pina, MD  Date: 06/06/2023 Duration: 22.43 mins  Patient history: 89yo F left sided weakness and R gaze deviation. EEG to evaluate for seizure  Level of alertness: Awake  AEDs during EEG study: GBP  Technical aspects: This EEG study was done with scalp electrodes positioned according to the 10-20 International system of electrode placement. Electrical activity was reviewed with band pass filter of 1-70Hz , sensitivity of 7 uV/mm, display speed of 55mm/sec with a 60Hz  notched filter applied as appropriate. EEG data were recorded continuously and digitally stored.  Video monitoring was available and reviewed as appropriate.  Description: The posterior dominant rhythm consists of 7.5 Hz activity of moderate voltage (25-35 uV) seen predominantly in posterior head regions, symmetric and reactive to eye opening and eye closing. EEG showed intermittent generalized 3 to 6 Hz theta-delta slowing. Hyperventilation and photic stimulation were not performed.     ABNORMALITY - Intermittent slow, generalized  IMPRESSION: This study is suggestive of mild diffuse encephalopathy, nonspecific etiology. No seizures or epileptiform discharges were seen throughout the recording.  Sarah Freeman

## 2023-06-06 NOTE — ED Notes (Signed)
EEG at bedside.

## 2023-06-06 NOTE — Progress Notes (Signed)
Pt currently in MRI will try back as schedule permits for LTM EEG.

## 2023-06-06 NOTE — ED Notes (Signed)
Patient transported to X-ray 

## 2023-06-06 NOTE — Progress Notes (Signed)
Patient received from [facility] ED, alert and oriented X1, vital signs stable, tele monitored, son and daughter at bed side, bed in lowest position, bed alarm on, will continue to monitor.

## 2023-06-06 NOTE — ED Notes (Signed)
ED TO INPATIENT HANDOFF REPORT  ED Nurse Name and Phone #: Grover Canavan [phone]  S Name/Age/Gender Sarah Freeman 89 y.o. female Room/Bed: 024C/024C  Code Status   Code Status: DNR  Home/SNF/Other Undecided, most likely rehab or SNF  Patient oriented to: self Is this baseline? No   Triage Complete: Triage complete  Chief Complaint Stroke-like symptoms [R29.90]  Triage Note Patient BIB EMS from home due to stroke like symptoms. EMS reports left sided deficits, slurred speech, and right sided gaze. EMS reports patient fells in bathroom, is on thinners. LKW 9:10 PM. Patient alert.   Allergies Allergies  Allergen Reactions   Statins Other (See Comments)    Leg cramps    Bacitracin Other (See Comments)    Doesn't help wound to heal   Neomycin-Polymyxin-Gramicidin Other (See Comments)    Pt states wounds do not heal with neosporin     Level of Care/Admitting Diagnosis ED Disposition   ED Disposition: Admit Condition: None Comment: Hospital Area: [facility] [[id number]]  Level of Care: Progressive [102]  Admit to Progressive based on following criteria: NEUROLOGICAL AND NEUROSURGICAL complex patients with significant risk of instability, who do not meet ICU criteria, yet require close observation or frequent assessment (< / = every 2 - 4 hours) with medical / nursing intervention.  May admit patient to [facility] or [facility] if equivalent level of care is available:: Yes  Covid Evaluation: Asymptomatic - no recent exposure (last 10 days) testing not required  Diagnosis: Stroke-like symptoms [725552]  Admitting Physician: Magnus Ivan [[id number]]  Attending Physician: Magnus Ivan [[id number]]  Certification:: I certify this patient will need inpatient services for at least 2 midnights  Estimated Length of Stay: 2      B Medical/Surgery History Past Medical History:  Diagnosis Date   Hyperlipidemia    Hypothyroid    Stroke [facility])    Urinary  incontinence    Past Surgical History:  Procedure Laterality Date   BREAST BIOPSY Left 2000?   neg. dr. [facility] Nail office   CHOLECYSTECTOMY  1964   COLONOSCOPY     Dr Maryruth Bun   DILATION AND CURETTAGE OF UTERUS     EYE SURGERY     LOOP RECORDER INSERTION N/A 02/09/2018   Procedure: LOOP RECORDER INSERTION;  Surgeon: Duke Salvia, MD;  Location: [facility] INVASIVE CV LAB;  Service: Cardiovascular;  Laterality: N/A;   SKIN CANCER EXCISION     face   TEE WITHOUT CARDIOVERSION N/A 02/09/2018   Procedure: TRANSESOPHAGEAL ECHOCARDIOGRAM (TEE);  Surgeon: Antonieta Iba, MD;  Location: [facility] ORS;  Service: Cardiovascular;  Laterality: N/A;   TOTAL ABDOMINAL HYSTERECTOMY W/ BILATERAL SALPINGOOPHORECTOMY     age 57     A IV Location/Drains/Wounds Patient Lines/Drains/Airways Status     Active Line/Drains/Airways     Name Placement date Placement time Site Days   Peripheral IV 06/06/23 18 G Right Antecubital 06/06/23  no documentation  Antecubital  less than 1   External Urinary Catheter 06/06/23  0920  no documentation  less than 1            Intake/Output Last 24 hours  Intake/Output Summary (Last 24 hours) at 06/06/2023 1714 Last data filed at 06/06/2023 7829 Gross per 24 hour  Intake 270.36 ml  Output no documentation  Net 270.36 ml    Labs/Imaging Results for orders placed or performed during the hospital encounter of 06/06/23 (from the past 48 hour(s))  CBG monitoring, ED     Status: None  Collection Time: 06/06/23  4:45 AM  Result Value Ref Range   Glucose-Capillary 87 70 - 99 mg/dL    Comment: Glucose reference range applies only to samples taken after fasting for at least 8 hours.  Ethanol     Status: None   Collection Time: 06/06/23  4:47 AM  Result Value Ref Range   Alcohol, Ethyl (B) <10 <10 mg/dL    Comment: (NOTE) Lowest detectable limit for serum alcohol is 10 mg/dL.  For medical purposes only. Performed at [facility], [address],  [place], [place] [address]   Protime-INR     Status: None   Collection Time: 06/06/23  4:47 AM  Result Value Ref Range   Prothrombin Time 13.8 11.4 - 15.2 seconds   INR 1.0 0.8 - 1.2    Comment: (NOTE) INR goal varies based on device and disease states. Performed at [facility], [address], [place], [place] [address]   APTT     Status: None   Collection Time: 06/06/23  4:47 AM  Result Value Ref Range   aPTT 26 24 - 36 seconds    Comment: Performed at [facility], [address] [place], [place] [address]  CBC     Status: Abnormal   Collection Time: 06/06/23  4:47 AM  Result Value Ref Range   WBC 7.7 4.0 - 10.5 K/uL   RBC 3.85 (L) 3.87 - 5.11 MIL/uL   Hemoglobin 12.0 12.0 - 15.0 g/dL   HCT 91.4 78.2 - 95.6 %   MCV 95.6 80.0 - 100.0 fL   MCH 31.2 26.0 - 34.0 pg   MCHC 32.6 30.0 - 36.0 g/dL   RDW 21.3 08.6 - 57.8 %   Platelets 268 150 - 400 K/uL   nRBC 0.0 0.0 - 0.2 %    Comment: Performed at [facility], [address], [place], [place] [address]  Differential     Status: None   Collection Time: 06/06/23  4:47 AM  Result Value Ref Range   Neutrophils Relative % 43 %   Neutro Abs 3.3 1.7 - 7.7 K/uL   Lymphocytes Relative 38 %   Lymphs Abs 2.9 0.7 - 4.0 K/uL   Monocytes Relative 12 %   Monocytes Absolute 0.9 0.1 - 1.0 K/uL   Eosinophils Relative 6 %   Eosinophils Absolute 0.5 0.0 - 0.5 K/uL   Basophils Relative 1 %   Basophils Absolute 0.1 0.0 - 0.1 K/uL   Immature Granulocytes 0 %   Abs Immature Granulocytes 0.01 0.00 - 0.07 K/uL    Comment: Performed at [facility], [address], [place], [place] [address]  Comprehensive metabolic panel     Status: Abnormal   Collection Time: 06/06/23  4:47 AM  Result Value Ref Range   Sodium 137 135 - 145 mmol/L   Potassium 3.8 3.5 - 5.1 mmol/L   Chloride 102 98 - 111 mmol/L   CO2 25 22 - 32 mmol/L   Glucose, Bld 101 (H) 70 - 99 mg/dL    Comment: Glucose reference range applies only to samples taken  after fasting for at least 8 hours.   BUN 9 8 - 23 mg/dL   Creatinine, Ser 1.32 0.44 - 1.00 mg/dL   Calcium 8.8 (L) 8.9 - 10.3 mg/dL   Total Protein 6.4 (L) 6.5 - 8.1 g/dL   Albumin 2.9 (L) 3.5 - 5.0 g/dL   AST 43 (H) 15 - 41 U/L  ALT 38 0 - 44 U/L   Alkaline Phosphatase 51 38 - 126 U/L   Total Bilirubin 0.5 0.3 - 1.2 mg/dL   GFR, Estimated 56 (L) >60 mL/min    Comment: (NOTE) Calculated using the CKD-EPI Creatinine Equation (2021)    Anion gap 10 5 - 15    Comment: Performed at [facility], [address] [place], [place] [address]  I-stat chem 8, ED     Status: None   Collection Time: 06/06/23  4:50 AM  Result Value Ref Range   Sodium 138 135 - 145 mmol/L   Potassium 3.9 3.5 - 5.1 mmol/L   Chloride 103 98 - 111 mmol/L   BUN 9 8 - 23 mg/dL   Creatinine, Ser 4.01 0.44 - 1.00 mg/dL   Glucose, Bld 98 70 - 99 mg/dL    Comment: Glucose reference range applies only to samples taken after fasting for at least 8 hours.   Calcium, Ion 1.17 1.15 - 1.40 mmol/L   TCO2 25 22 - 32 mmol/L   Hemoglobin 12.6 12.0 - 15.0 g/dL   HCT 02.7 25.3 - 66.4 %   EEG adult  Result Date: 06/06/2023 Charlsie Quest, MD     06/06/2023  3:54 PM Patient Name: Sarah Freeman MRN: [id number] Epilepsy Attending: Charlsie Quest Referring Physician/Provider: Caryl Pina, MD Date: 06/06/2023 Duration: 22.43 mins Patient history: 89yo F left sided weakness and R gaze deviation. EEG to evaluate for seizure Level of alertness: Awake AEDs during EEG study: GBP Technical aspects: This EEG study was done with scalp electrodes positioned according to the 10-20 International system of electrode placement. Electrical activity was reviewed with band pass filter of 1-70Hz , sensitivity of 7 uV/mm, display speed of 1mm/sec with a 60Hz  notched filter applied as appropriate. EEG data were recorded continuously and digitally stored.  Video monitoring was available and reviewed as appropriate. Description: The posterior  dominant rhythm consists of 7.5 Hz activity of moderate voltage (25-35 uV) seen predominantly in posterior head regions, symmetric and reactive to eye opening and eye closing. EEG showed intermittent generalized 3 to 6 Hz theta-delta slowing. Hyperventilation and photic stimulation were not performed.   ABNORMALITY - Intermittent slow, generalized IMPRESSION: This study is suggestive of mild diffuse encephalopathy, nonspecific etiology. No seizures or epileptiform discharges were seen throughout the recording. Charlsie Quest   DG Swallowing Func-Speech Pathology  Result Date: 06/06/2023 Table formatting from the original result was not included. Modified Barium Swallow Study Patient Details Name: Sarah Freeman MRN: [id number] Date of Birth: [birth year] Today's Date: 06/06/2023 HPI/PMH: HPI: ANGEE FITTING is a 89 y.o. female who presented to [facility] via EMS as a Code Stroke with left sided weakness and R gaze deviation, after getting up to go to the bathroom in the middle of the night and falling.  MRI 7/8: "Acute infarct in the deep right cerebral white matter affecting  external capsule and corona radiata. Small separate right frontal  and parietal white matter infarcts."  Pt with medical history significant of dementia, prior CVA in 2017, hypertension, hyperlipidemia, bilateral carotid artery stenosis, hypothyroidism, stage III CKD, GERD, aortic atherosclerosis, and recent hospitalization in May 2024 and found to have a small acute infarct in the right corona radiata Clinical Impression: Clinical Impression: Pt presents with an overall functional oropharyngeal swallow. Generalized oral weakness and acute L sided asymmetry contribute to instances of anterior spillage and trace residue, which lines the tongue and eventually clears with subsequent swallows. Pt  had one instance of sensed aspiration during the initial teaspoonful of thin liquids that was independently ejected with a cough (PAS 6) in which the bolus  spilled over the arytenoids during the swallow prior to complete closure of the laryngeal vestibule. No other instances of penetration or aspiration occurred with subsequent challenging sips of thin liquids, nectar thick liquids, honey thick liquids, or purees. When cued to take a bite of the graham cracker with purees, pt only took the puree; therefore solids were unable to be tested today. The pill was administered with thin liquids via straw and passed without difficulty. Pt may benefit from continued SLP f/u to address potential to upgrade diet and target functional compensatory strategies. Recommend initiating diet of Dys 1 textures with thin liquids and meds given whole with liquids as mentation allows (or crushed in puree PRN). Recommend full superivison during meal times. Factors that may increase risk of adverse event in presence of aspiration [name]): Factors that may increase risk of adverse event in presence of aspiration [name]): Poor general health and/or compromised immunity; Reduced cognitive function; Limited mobility; Frail or deconditioned Recommendations/Plan: Swallowing Evaluation Recommendations Swallowing Evaluation Recommendations Recommendations: PO diet PO Diet Recommendation: Dysphagia 1 (Pureed); Thin liquids (Level 0) Liquid Administration via: Straw; Cup Medication Administration: Whole meds with liquid Supervision: Full supervision/cueing for swallowing strategies Swallowing strategies  : Minimize environmental distractions; Slow rate; Small bites/sips; Check for anterior loss Postural changes: Position pt fully upright for meals Oral care recommendations: Oral care BID (2x/day) Treatment Plan Treatment Plan Treatment recommendations: Therapy as outlined in treatment plan below Follow-up recommendations: Skilled nursing-short term rehab (<3 hours/day) Functional status assessment: Patient has had a recent decline in their functional status and  demonstrates the ability to make significant improvements in function in a reasonable and predictable amount of time. Treatment frequency: Min 2x/week Treatment duration: 2 weeks Interventions: Aspiration precaution training; Compensatory techniques; Patient/family education; Trials of upgraded texture/liquids; Diet toleration management by SLP Recommendations Recommendations for follow up therapy are one component of a multi-disciplinary discharge planning process, led by the attending physician.  Recommendations may be updated based on patient status, additional functional criteria and insurance authorization. Assessment: Orofacial Exam: Orofacial Exam Oral Cavity: Oral Hygiene: WFL Oral Cavity - Dentition: Dentures, top; Dentures, bottom Orofacial Anatomy: Other (comment) (L sided assymetry) Oral Motor/Sensory Function: Generalized oral weakness Anatomy: Anatomy: Suspected cervical osteophytes Boluses Administered: Boluses Administered Boluses Administered: Thin liquids (Level 0); Mildly thick liquids (Level 2, nectar thick); Moderately thick liquids (Level 3, honey thick); Puree  Oral Impairment Domain: Oral Impairment Domain Lip Closure: Escape progressing to mid-chin Tongue control during bolus hold: Not tested Bolus preparation/mastication: -- (unable to test solids) Bolus transport/lingual motion: Brisk tongue motion Oral residue: Trace residue lining oral structures Location of oral residue : Tongue Initiation of pharyngeal swallow : Pyriform sinuses  Pharyngeal Impairment Domain: Pharyngeal Impairment Domain Soft palate elevation: No bolus between soft palate (SP)/pharyngeal wall (PW) Laryngeal elevation: Complete superior movement of thyroid cartilage with complete approximation of arytenoids to epiglottic petiole Anterior hyoid excursion: Partial anterior movement Epiglottic movement: Partial inversion Laryngeal vestibule closure: Incomplete, narrow column air/contrast in laryngeal vestibule Pharyngeal  stripping wave : Present - diminished Pharyngeal contraction (A/P view only): N/A Pharyngoesophageal segment opening: Complete distension and complete duration, no obstruction of flow Tongue base retraction: Trace column of contrast or air between tongue base and PPW Pharyngeal residue: Complete pharyngeal clearance Location of pharyngeal residue: N/A  Esophageal Impairment Domain: Esophageal  Impairment Domain Esophageal clearance upright position: Complete clearance, esophageal coating Pill: Pill Consistency administered: Thin liquids (Level 0) Thin liquids (Level 0): [facility] Penetration/Aspiration Scale Score: Penetration/Aspiration Scale Score 6.  Material enters airway, passes BELOW cords then ejected out: Thin liquids (Level 0) Compensatory Strategies: Compensatory Strategies Compensatory strategies: Yes Straw: Effective   General Information: Caregiver present: Yes  Diet Prior to this Study: NPO   Temperature : Normal   Respiratory Status: WFL   Supplemental O2: None (Room air)   History of Recent Intubation: No  Behavior/Cognition: Alert; Cooperative; Requires cueing Self-Feeding Abilities: Needs assist with self-feeding Baseline vocal quality/speech: Normal Volitional Cough: Able to elicit Volitional Swallow: Able to elicit Exam Limitations: No limitations Goal Planning: Prognosis for improved oropharyngeal function: Good Barriers to Reach Goals: Cognitive deficits No data recorded Patient/Family Stated Goal: wants potatoes Consulted and agree with results and recommendations: Patient; Family member/caregiver Pain: Pain Assessment Pain Assessment: Faces Faces Pain Scale: 0 End of Session: Start Time:SLP Start Time (ACUTE ONLY): 1343 Stop Time: SLP Stop Time (ACUTE ONLY): 1401 Time Calculation:SLP Time Calculation (min) (ACUTE ONLY): 18 min Charges: SLP Evaluations $ SLP Speech Visit: 1 Visit SLP Evaluations $BSS Swallow: 1 Procedure $MBS Swallow: 1 Procedure SLP visit diagnosis: SLP Visit Diagnosis: Dysphagia,  unspecified (R13.10) Past Medical History: Past Medical History: Diagnosis Date  Hyperlipidemia   Hypothyroid   Stroke [facility])   Urinary incontinence  Past Surgical History: Past Surgical History: Procedure Laterality Date  BREAST BIOPSY Left 2000?  neg. dr. [place] Nail office  CHOLECYSTECTOMY  1964  COLONOSCOPY    Dr Maryruth Bun  DILATION AND CURETTAGE OF UTERUS    EYE SURGERY    LOOP RECORDER INSERTION N/A 02/09/2018  Procedure: LOOP RECORDER INSERTION;  Surgeon: Duke Salvia, MD;  Location: [facility] INVASIVE CV LAB;  Service: Cardiovascular;  Laterality: N/A;  SKIN CANCER EXCISION    face  TEE WITHOUT CARDIOVERSION N/A 02/09/2018  Procedure: TRANSESOPHAGEAL ECHOCARDIOGRAM (TEE);  Surgeon: Antonieta Iba, MD;  Location: [facility] ORS;  Service: Cardiovascular;  Laterality: N/A;  TOTAL ABDOMINAL HYSTERECTOMY W/ BILATERAL SALPINGOOPHORECTOMY    age 25 Gwynneth Aliment, M.A., CF-SLP Speech Language Pathology, Acute Rehabilitation Services Secure Chat preferred [phone] 06/06/2023, 3:05 PM  MR BRAIN WO CONTRAST  Result Date: 06/06/2023 CLINICAL DATA:  Neuro deficit with acute stroke suspected EXAM: MRI HEAD WITHOUT CONTRAST TECHNIQUE: Multiplanar, multiecho pulse sequences of the brain and surrounding structures were obtained without intravenous contrast. COMPARISON:  Head CT and CTA from earlier today. Brain MRI 04/06/2023 FINDINGS: Brain: Confluent restricted diffusion in the right corona radiata and external capsule. Separate area of small restricted diffusion in the right superior frontal and parietal white matter. Background of extensive chronic small vessel ischemia. Small chronic right cerebral cortex infarcts. No hemorrhage, hydrocephalus, or collection. Vascular: Major flow voids are preserved.  There was preceding CTA. Skull and upper cervical spine: Normal marrow signal Sinuses/Orbits: Bilateral cataract resection. Partial right mastoid opacification. IMPRESSION: 1. Acute infarct in the deep right cerebral white matter  affecting external capsule and corona radiata. Small separate right frontal and parietal white matter infarcts. 2. Extensive chronic small vessel disease and small chronic right frontal cortex infarcts. Electronically Signed   By: Tiburcio Pea M.D.   On: 06/06/2023 08:03   CT ANGIO HEAD NECK W WO CM (CODE STROKE)  Result Date: 06/06/2023 CLINICAL DATA:  Code stroke.  Left-sided weakness EXAM: CT ANGIOGRAPHY HEAD AND NECK CT PERFUSION BRAIN TECHNIQUE: Multidetector CT imaging of the head and neck was performed using the  standard protocol during bolus administration of intravenous contrast. Multiplanar CT image reconstructions and MIPs were obtained to evaluate the vascular anatomy. Carotid stenosis measurements (when applicable) are obtained utilizing NASCET criteria, using the distal internal carotid diameter as the denominator. Multiphase CT imaging of the brain was performed following IV bolus contrast injection. Subsequent parametric perfusion maps were calculated using RAPID software. RADIATION DOSE REDUCTION: This exam was performed according to the departmental dose-optimization program which includes automated exposure control, adjustment of the mA and/or kV according to patient size and/or use of iterative reconstruction technique. CONTRAST:  OMNIPAQUE IOHEXOL 350 MG/ML SOLN COMPARISON:  MRA of the head and neck 04/06/2023 FINDINGS: CTA NECK FINDINGS Aortic arch: Atheromatous plaque with 3 vessel branching. No acute finding or dilatation Right carotid system: Mixed density plaque primarily at the bifurcation. Proximal ICA stenosis measures 50% on coronal reformats. No ulceration or dissection. Left carotid system: Calcified plaque at the bifurcation without stenosis or ulceration. Vertebral arteries: Proximal subclavian atherosclerosis without flow reducing stenosis. The left vertebral artery is dominant the smoothly contoured, and widely patent. Skeleton: Generalized cervical spine degeneration  with multilevel anterolisthesis. Other neck: No acute finding Upper chest: No acute finding Review of the MIP images confirms the above findings CTA HEAD FINDINGS Anterior circulation: Atheromatous calcification of the carotid siphons. No branch occlusion, beading, or aneurysm. No proximal flow limiting stenosis. Posterior circulation: Left dominant vertebral artery. The vertebral and basilar arteries are widely patent. High-grade narrowing at the left PCA bifurcation although no major branch occlusion. No beading or aneurysm Venous sinuses: Unremarkable for the arterial phase Anatomic variants: None significant Review of the MIP images confirms the above findings CT Brain Perfusion Findings: ASPECTS: 10 CBF (<30%) Volume: 0mL Perfusion (Tmax>6.0s) volume: 0mL With very strict time parameters there is relatively delayed perfusion in the right MCA upper division and watershed territories. IMPRESSION: 1. No emergent large vessel occlusion. 2. No infarct or ischemia by standard threshold CT perfusion. With very strict time parameters there is relatively delayed perfusion to the upper division right MCA territory and watershed which may be related to a 50-55% right ICA origin stenosis. 3. High-grade left PCA branch narrowing. Electronically Signed   By: Tiburcio Pea M.D.   On: 06/06/2023 05:27   CT CEREBRAL PERFUSION W CONTRAST  Result Date: 06/06/2023 CLINICAL DATA:  Code stroke.  Left-sided weakness EXAM: CT ANGIOGRAPHY HEAD AND NECK CT PERFUSION BRAIN TECHNIQUE: Multidetector CT imaging of the head and neck was performed using the standard protocol during bolus administration of intravenous contrast. Multiplanar CT image reconstructions and MIPs were obtained to evaluate the vascular anatomy. Carotid stenosis measurements (when applicable) are obtained utilizing NASCET criteria, using the distal internal carotid diameter as the denominator. Multiphase CT imaging of the brain was performed following IV bolus  contrast injection. Subsequent parametric perfusion maps were calculated using RAPID software. RADIATION DOSE REDUCTION: This exam was performed according to the departmental dose-optimization program which includes automated exposure control, adjustment of the mA and/or kV according to patient size and/or use of iterative reconstruction technique. CONTRAST:  OMNIPAQUE IOHEXOL 350 MG/ML SOLN COMPARISON:  MRA of the head and neck 04/06/2023 FINDINGS: CTA NECK FINDINGS Aortic arch: Atheromatous plaque with 3 vessel branching. No acute finding or dilatation Right carotid system: Mixed density plaque primarily at the bifurcation. Proximal ICA stenosis measures 50% on coronal reformats. No ulceration or dissection. Left carotid system: Calcified plaque at the bifurcation without stenosis or ulceration. Vertebral arteries: Proximal subclavian atherosclerosis without flow reducing stenosis.  The left vertebral artery is dominant the smoothly contoured, and widely patent. Skeleton: Generalized cervical spine degeneration with multilevel anterolisthesis. Other neck: No acute finding Upper chest: No acute finding Review of the MIP images confirms the above findings CTA HEAD FINDINGS Anterior circulation: Atheromatous calcification of the carotid siphons. No branch occlusion, beading, or aneurysm. No proximal flow limiting stenosis. Posterior circulation: Left dominant vertebral artery. The vertebral and basilar arteries are widely patent. High-grade narrowing at the left PCA bifurcation although no major branch occlusion. No beading or aneurysm Venous sinuses: Unremarkable for the arterial phase Anatomic variants: None significant Review of the MIP images confirms the above findings CT Brain Perfusion Findings: ASPECTS: 10 CBF (<30%) Volume: 0mL Perfusion (Tmax>6.0s) volume: 0mL With very strict time parameters there is relatively delayed perfusion in the right MCA upper division and watershed territories. IMPRESSION: 1.  No emergent large vessel occlusion. 2. No infarct or ischemia by standard threshold CT perfusion. With very strict time parameters there is relatively delayed perfusion to the upper division right MCA territory and watershed which may be related to a 50-55% right ICA origin stenosis. 3. High-grade left PCA branch narrowing. Electronically Signed   By: Tiburcio Pea M.D.   On: 06/06/2023 05:27   CT HEAD CODE STROKE WO CONTRAST  Result Date: 06/06/2023 CLINICAL DATA:  Code stroke.  Left-sided weakness EXAM: CT HEAD WITHOUT CONTRAST TECHNIQUE: Contiguous axial images were obtained from the base of the skull through the vertex without intravenous contrast. RADIATION DOSE REDUCTION: This exam was performed according to the departmental dose-optimization program which includes automated exposure control, adjustment of the mA and/or kV according to patient size and/or use of iterative reconstruction technique. COMPARISON:  Head CT 04/06/2023 FINDINGS: Brain: No evidence of acute infarction, hemorrhage, hydrocephalus, extra-axial collection or mass lesion/mass effect. Small patchy areas of chronic cortically based infarct along the superior right frontal lobe best seen on sagittal reformats. Generalized cerebral volume loss and chronic small vessel ischemia. Vascular: No hyperdense vessel or unexpected calcification. Right MCA branch region calcification is chronic. Skull: Normal. Negative for fracture or focal lesion. Sinuses/Orbits: Preferential gaze to the right Other: Discussed with Dr. Ezzie Dural while in progress ASPECTS [facility] Stroke Program Early CT Score) - Ganglionic level infarction (caudate, lentiform nuclei, internal capsule, insula, M1-M3 cortex): 7 - Supraganglionic infarction (M4-M6 cortex): 3 Total score (0-10 with 10 being normal): 10 IMPRESSION: 1. No acute finding. 2. Atrophy and chronic small vessel ischemia with small chronic right frontal cortex infarcts. Electronically Signed   By: Tiburcio Pea  M.D.   On: 06/06/2023 05:18    Pending Labs Unresulted Labs (From admission, onward)     Start     Ordered   06/13/23 0500  Creatinine, serum  (enoxaparin (LOVENOX)    CrCl >/= 30 ml/min)  Weekly,   R     Comments: while on enoxaparin therapy    06/06/23 0922   06/06/23 0446  Urine rapid drug screen (hosp performed)  Once,   STAT        06/06/23 0445   06/06/23 0446  Urinalysis, Routine w reflex microscopic -Urine, Clean Catch  Once,   URGENT       Question:  Specimen Source  Answer:  Urine, Clean Catch   06/06/23 0445            Vitals/Pain Today's Vitals   06/06/23 1115 06/06/23 1325 06/06/23 1432 06/06/23 1434  BP:  (Abnormal) 153/65    Pulse:  66    Resp:  20    Temp:    97.6 F (36.4 C)  TempSrc:    Axillary  SpO2:  100%    Weight:      Height:      PainSc: 0-No pain  Asleep     Isolation Precautions No active isolations  Medications Medications  donepezil (ARICEPT) tablet 5 mg (5 mg Oral Not Given 06/06/23 0931)  gabapentin (NEURONTIN) capsule 200 mg (200 mg Oral Not Given 06/06/23 0931)  levothyroxine (SYNTHROID) tablet 50 mcg (has no administration in time range)  nitrofurantoin (macrocrystal-monohydrate) (MACROBID) capsule 100 mg (has no administration in time range)  polyethylene glycol (MIRALAX / GLYCOLAX) packet 17 g (17 g Oral Not Given 06/06/23 0931)  QUEtiapine (SEROQUEL) tablet 25 mg (has no administration in time range)  rosuvastatin (CRESTOR) tablet 5 mg (5 mg Oral Not Given 06/06/23 0932)   stroke: early stages of recovery book (has no administration in time range)  0.9 %  sodium chloride infusion ( Intravenous New Bag/Given 06/06/23 1020)  acetaminophen (TYLENOL) tablet 650 mg (has no administration in time range)    Or  acetaminophen (TYLENOL) 160 MG/5ML solution 650 mg (has no administration in time range)    Or  acetaminophen (TYLENOL) suppository 650 mg (has no administration in time range)  enoxaparin (LOVENOX) injection 40 mg (40 mg Subcutaneous  Given 06/06/23 1024)  pantoprazole (PROTONIX) injection 40 mg (40 mg Intravenous Given 06/06/23 1020)  ondansetron (ZOFRAN) injection 4 mg (has no administration in time range)  melatonin tablet 3 mg (has no administration in time range)  aspirin chewable tablet 81 mg (has no administration in time range)  ticagrelor (BRILINTA) tablet 90 mg (has no administration in time range)  iohexol (OMNIPAQUE) 350 MG/ML injection 115 mL (115 mLs Intravenous Contrast Given 06/06/23 0514)  levETIRAcetam (KEPPRA) 2,000 mg in sodium chloride 0.9 % 250 mL IVPB (0 mg Intravenous Stopped 06/06/23 0647)    Mobility non-ambulatory     Focused Assessments Neuro Assessment Handoff:  Swallow screen pass? Yes  Cardiac Rhythm: Normal sinus rhythm NIH Stroke Scale  Dizziness Present: No Headache Present: No Interval: Shift assessment Level of Consciousness (1a.)   : Alert, keenly responsive LOC Questions (1b. )   : Answers one question correctly LOC Commands (1c. )   : Performs both tasks correctly Best Gaze (2. )  : Normal Visual (3. )  : Partial hemianopia Facial Palsy (4. )    : Minor paralysis Motor Arm, Left (5a. )   : Some effort against gravity Motor Arm, Right (5b. ) : No drift Motor Leg, Left (6a. )  : Some effort against gravity Motor Leg, Right (6b. ) : No drift Limb Ataxia (7. ): Present in one limb Sensory (8. )  : Normal, no sensory loss Best Language (9. )  : No aphasia Dysarthria (10. ): Mild-to-moderate dysarthria, patient slurs at least some words and, at worst, can be understood with some difficulty Extinction/Inattention (11.)   : No Abnormality Complete NIHSS TOTAL: 9 Last date known well: 06/05/23 Last time known well: 2110 Neuro Assessment: Exceptions to [facility] Neuro Checks:   Initial (06/06/23 0445)  Has TPA been given? No If patient is a Neuro Trauma and patient is going to OR before floor call report to 4N Charge nurse: [phone] or [phone]   R Recommendations: See  Admitting Provider Note  Report given to:   Additional Notes: Next Q4 1920

## 2023-06-06 NOTE — ED Provider Notes (Signed)
[place] EMERGENCY DEPARTMENT AT [facility] Provider Note   CSN: [id number] Arrival date & time: 06/06/23  0443     History  No chief complaint on file.   Sarah Freeman is a 89 y.o. female.  The history is provided by the EMS personnel. The history is limited by the condition of the patient.  Cerebrovascular Accident This is a new problem. The current episode started 3 to 5 hours ago. The problem occurs constantly. The problem has not changed since onset.Nothing aggravates the symptoms. Nothing relieves the symptoms. She has tried nothing for the symptoms. The treatment provided no relief.  LSN 910 pm prior to bed.  Larey Seat this am on DOAC.       Home Medications Prior to Admission medications   Medication Sig Start Date End Date Taking? Authorizing Provider  aspirin EC 81 MG tablet Take 81 mg by mouth daily. Swallow whole.    [provider]  cholecalciferol (VITAMIN D3) 25 MCG (1000 UNIT) tablet Take 1,000 Units by mouth daily.    [provider]  clopidogrel (PLAVIX) 75 MG tablet Take 1 tablet (75 mg total) by mouth daily. 01/04/23   Johnson, Megan P, DO  cyanocobalamin 1000 MCG tablet Take 1 tablet (1,000 mcg total) by mouth daily. 04/19/23   Amin, Loura Halt, MD  donepezil (ARICEPT) 5 MG tablet Take 1 tablet (5 mg total) by mouth at bedtime. 01/04/23   Johnson, Megan P, DO  gabapentin (NEURONTIN) 100 MG capsule TAKE 2 CAPSULE(100 MG) BY MOUTH IN THE AM and 1 AT PM Patient taking differently: 2 mg at bedtime. TAKE 2 CAPSULE(100 MG) BY MOUTH IN THE AM and 1 AT PM 01/04/23   Johnson, Megan P, DO  levothyroxine (SYNTHROID) 50 MCG tablet Take 1 tablet (50 mcg total) by mouth daily before breakfast. 01/10/23   Olevia Perches P, DO  nitrofurantoin, macrocrystal-monohydrate, (MACROBID) 100 MG capsule Take 1 capsule (100 mg total) by mouth at bedtime. 11/08/22   Alfredo Martinez, MD  pantoprazole (PROTONIX) 40 MG tablet Take 1 tablet (40 mg total) by mouth  daily. 05/17/23   Johnson, Megan P, DO  polyethylene glycol (MIRALAX / GLYCOLAX) 17 g packet Take 17 g by mouth daily.    [provider]  PROLIA 60 MG/ML SOSY injection Inject 60 mg into the skin every 6 (six) months. 05/22/20   [provider]  QUEtiapine (SEROQUEL) 25 MG tablet Take 0.5-1 tablets (12.5-25 mg total) by mouth every evening. 05/17/23   Johnson, Megan P, DO  simvastatin (ZOCOR) 20 MG tablet Take 1 tablet (20 mg total) by mouth daily at 6 PM. 05/17/23   Olevia Perches P, DO      Allergies    Statins, Bacitracin, Neomycin, Neosporin + pain relief max st  [neomy-bacit-polymyx-pramoxine], Polymyxin b, and Neomycin-polymyxin-gramicidin    Review of Systems   Review of Systems  Unable to perform ROS: Acuity of condition  Neurological:  Positive for speech difficulty and weakness.    Physical Exam Updated Vital Signs Ht 4\' 8"  (1.422 m)   Wt 77.4 kg   BMI 38.26 kg/m  Physical Exam Vitals and nursing note reviewed. Exam conducted with a chaperone present.  Constitutional:      General: She is not in acute distress.    Appearance: She is well-developed.  HENT:     Head: Normocephalic and atraumatic.     Nose: Nose normal.  Eyes:     Pupils: Pupils are equal, round, and reactive to light.  Comments: Gaze preference to the right   Cardiovascular:     Rate and Rhythm: Normal rate and regular rhythm.     Pulses: Normal pulses.     Heart sounds: Normal heart sounds.  Pulmonary:     Effort: Pulmonary effort is normal. No respiratory distress.     Breath sounds: Normal breath sounds.  Abdominal:     General: Bowel sounds are normal. There is no distension.     Palpations: Abdomen is soft.     Tenderness: There is no abdominal tenderness. There is no guarding or rebound.  Genitourinary:    Vagina: No vaginal discharge.  Musculoskeletal:        General: Normal range of motion.     Cervical back: Neck supple.  Skin:    General: Skin is dry.      Capillary Refill: Capillary refill takes less than 2 seconds.     Findings: No erythema or rash.  Neurological:     General: No focal deficit present.     Motor: Weakness present.     Deep Tendon Reflexes: Reflexes normal.  Psychiatric:        Mood and Affect: Mood normal.     ED Results / Procedures / Treatments   Labs (all labs ordered are listed, but only abnormal results are displayed) Results for orders placed or performed during the hospital encounter of 06/06/23  Ethanol  Result Value Ref Range   Alcohol, Ethyl (B) <10 <10 mg/dL  Protime-INR  Result Value Ref Range   Prothrombin Time 13.8 11.4 - 15.2 seconds   INR 1.0 0.8 - 1.2  APTT  Result Value Ref Range   aPTT 26 24 - 36 seconds  CBC  Result Value Ref Range   WBC 7.7 4.0 - 10.5 K/uL   RBC 3.85 (L) 3.87 - 5.11 MIL/uL   Hemoglobin 12.0 12.0 - 15.0 g/dL   HCT 16.1 09.6 - 04.5 %   MCV 95.6 80.0 - 100.0 fL   MCH 31.2 26.0 - 34.0 pg   MCHC 32.6 30.0 - 36.0 g/dL   RDW 40.9 81.1 - 91.4 %   Platelets 268 150 - 400 K/uL   nRBC 0.0 0.0 - 0.2 %  Differential  Result Value Ref Range   Neutrophils Relative % 43 %   Neutro Abs 3.3 1.7 - 7.7 K/uL   Lymphocytes Relative 38 %   Lymphs Abs 2.9 0.7 - 4.0 K/uL   Monocytes Relative 12 %   Monocytes Absolute 0.9 0.1 - 1.0 K/uL   Eosinophils Relative 6 %   Eosinophils Absolute 0.5 0.0 - 0.5 K/uL   Basophils Relative 1 %   Basophils Absolute 0.1 0.0 - 0.1 K/uL   Immature Granulocytes 0 %   Abs Immature Granulocytes 0.01 0.00 - 0.07 K/uL  Comprehensive metabolic panel  Result Value Ref Range   Sodium 137 135 - 145 mmol/L   Potassium 3.8 3.5 - 5.1 mmol/L   Chloride 102 98 - 111 mmol/L   CO2 25 22 - 32 mmol/L   Glucose, Bld 101 (H) 70 - 99 mg/dL   BUN 9 8 - 23 mg/dL   Creatinine, Ser 7.82 0.44 - 1.00 mg/dL   Calcium 8.8 (L) 8.9 - 10.3 mg/dL   Total Protein 6.4 (L) 6.5 - 8.1 g/dL   Albumin 2.9 (L) 3.5 - 5.0 g/dL   AST 43 (H) 15 - 41 U/L   ALT 38 0 - 44 U/L   Alkaline  Phosphatase 51 38 -  126 U/L   Total Bilirubin 0.5 0.3 - 1.2 mg/dL   GFR, Estimated 56 (L) >60 mL/min   Anion gap 10 5 - 15  I-stat chem 8, ED  Result Value Ref Range   Sodium 138 135 - 145 mmol/L   Potassium 3.9 3.5 - 5.1 mmol/L   Chloride 103 98 - 111 mmol/L   BUN 9 8 - 23 mg/dL   Creatinine, Ser 1.61 0.44 - 1.00 mg/dL   Glucose, Bld 98 70 - 99 mg/dL   Calcium, Ion 0.96 0.45 - 1.40 mmol/L   TCO2 25 22 - 32 mmol/L   Hemoglobin 12.6 12.0 - 15.0 g/dL   HCT 40.9 81.1 - 91.4 %  CBG monitoring, ED  Result Value Ref Range   Glucose-Capillary 87 70 - 99 mg/dL   CT ANGIO HEAD NECK W WO CM (CODE STROKE)  Result Date: 06/06/2023 CLINICAL DATA:  Code stroke.  Left-sided weakness EXAM: CT ANGIOGRAPHY HEAD AND NECK CT PERFUSION BRAIN TECHNIQUE: Multidetector CT imaging of the head and neck was performed using the standard protocol during bolus administration of intravenous contrast. Multiplanar CT image reconstructions and MIPs were obtained to evaluate the vascular anatomy. Carotid stenosis measurements (when applicable) are obtained utilizing NASCET criteria, using the distal internal carotid diameter as the denominator. Multiphase CT imaging of the brain was performed following IV bolus contrast injection. Subsequent parametric perfusion maps were calculated using RAPID software. RADIATION DOSE REDUCTION: This exam was performed according to the departmental dose-optimization program which includes automated exposure control, adjustment of the mA and/or kV according to patient size and/or use of iterative reconstruction technique. CONTRAST:  OMNIPAQUE IOHEXOL 350 MG/ML SOLN COMPARISON:  MRA of the head and neck 04/06/2023 FINDINGS: CTA NECK FINDINGS Aortic arch: Atheromatous plaque with 3 vessel branching. No acute finding or dilatation Right carotid system: Mixed density plaque primarily at the bifurcation. Proximal ICA stenosis measures 50% on coronal reformats. No ulceration or dissection. Left  carotid system: Calcified plaque at the bifurcation without stenosis or ulceration. Vertebral arteries: Proximal subclavian atherosclerosis without flow reducing stenosis. The left vertebral artery is dominant the smoothly contoured, and widely patent. Skeleton: Generalized cervical spine degeneration with multilevel anterolisthesis. Other neck: No acute finding Upper chest: No acute finding Review of the MIP images confirms the above findings CTA HEAD FINDINGS Anterior circulation: Atheromatous calcification of the carotid siphons. No branch occlusion, beading, or aneurysm. No proximal flow limiting stenosis. Posterior circulation: Left dominant vertebral artery. The vertebral and basilar arteries are widely patent. High-grade narrowing at the left PCA bifurcation although no major branch occlusion. No beading or aneurysm Venous sinuses: Unremarkable for the arterial phase Anatomic variants: None significant Review of the MIP images confirms the above findings CT Brain Perfusion Findings: ASPECTS: 10 CBF (<30%) Volume: 0mL Perfusion (Tmax>6.0s) volume: 0mL With very strict time parameters there is relatively delayed perfusion in the right MCA upper division and watershed territories. IMPRESSION: 1. No emergent large vessel occlusion. 2. No infarct or ischemia by standard threshold CT perfusion. With very strict time parameters there is relatively delayed perfusion to the upper division right MCA territory and watershed which may be related to a 50-55% right ICA origin stenosis. 3. High-grade left PCA branch narrowing. Electronically Signed   By: Tiburcio Pea M.D.   On: 06/06/2023 05:27   CT CEREBRAL PERFUSION W CONTRAST  Result Date: 06/06/2023 CLINICAL DATA:  Code stroke.  Left-sided weakness EXAM: CT ANGIOGRAPHY HEAD AND NECK CT PERFUSION BRAIN TECHNIQUE: Multidetector CT imaging of  the head and neck was performed using the standard protocol during bolus administration of intravenous contrast. Multiplanar  CT image reconstructions and MIPs were obtained to evaluate the vascular anatomy. Carotid stenosis measurements (when applicable) are obtained utilizing NASCET criteria, using the distal internal carotid diameter as the denominator. Multiphase CT imaging of the brain was performed following IV bolus contrast injection. Subsequent parametric perfusion maps were calculated using RAPID software. RADIATION DOSE REDUCTION: This exam was performed according to the departmental dose-optimization program which includes automated exposure control, adjustment of the mA and/or kV according to patient size and/or use of iterative reconstruction technique. CONTRAST:  OMNIPAQUE IOHEXOL 350 MG/ML SOLN COMPARISON:  MRA of the head and neck 04/06/2023 FINDINGS: CTA NECK FINDINGS Aortic arch: Atheromatous plaque with 3 vessel branching. No acute finding or dilatation Right carotid system: Mixed density plaque primarily at the bifurcation. Proximal ICA stenosis measures 50% on coronal reformats. No ulceration or dissection. Left carotid system: Calcified plaque at the bifurcation without stenosis or ulceration. Vertebral arteries: Proximal subclavian atherosclerosis without flow reducing stenosis. The left vertebral artery is dominant the smoothly contoured, and widely patent. Skeleton: Generalized cervical spine degeneration with multilevel anterolisthesis. Other neck: No acute finding Upper chest: No acute finding Review of the MIP images confirms the above findings CTA HEAD FINDINGS Anterior circulation: Atheromatous calcification of the carotid siphons. No branch occlusion, beading, or aneurysm. No proximal flow limiting stenosis. Posterior circulation: Left dominant vertebral artery. The vertebral and basilar arteries are widely patent. High-grade narrowing at the left PCA bifurcation although no major branch occlusion. No beading or aneurysm Venous sinuses: Unremarkable for the arterial phase Anatomic variants: None  significant Review of the MIP images confirms the above findings CT Brain Perfusion Findings: ASPECTS: 10 CBF (<30%) Volume: 0mL Perfusion (Tmax>6.0s) volume: 0mL With very strict time parameters there is relatively delayed perfusion in the right MCA upper division and watershed territories. IMPRESSION: 1. No emergent large vessel occlusion. 2. No infarct or ischemia by standard threshold CT perfusion. With very strict time parameters there is relatively delayed perfusion to the upper division right MCA territory and watershed which may be related to a 50-55% right ICA origin stenosis. 3. High-grade left PCA branch narrowing. Electronically Signed   By: Tiburcio Pea M.D.   On: 06/06/2023 05:27   CT HEAD CODE STROKE WO CONTRAST  Result Date: 06/06/2023 CLINICAL DATA:  Code stroke.  Left-sided weakness EXAM: CT HEAD WITHOUT CONTRAST TECHNIQUE: Contiguous axial images were obtained from the base of the skull through the vertex without intravenous contrast. RADIATION DOSE REDUCTION: This exam was performed according to the departmental dose-optimization program which includes automated exposure control, adjustment of the mA and/or kV according to patient size and/or use of iterative reconstruction technique. COMPARISON:  Head CT 04/06/2023 FINDINGS: Brain: No evidence of acute infarction, hemorrhage, hydrocephalus, extra-axial collection or mass lesion/mass effect. Small patchy areas of chronic cortically based infarct along the superior right frontal lobe best seen on sagittal reformats. Generalized cerebral volume loss and chronic small vessel ischemia. Vascular: No hyperdense vessel or unexpected calcification. Right MCA branch region calcification is chronic. Skull: Normal. Negative for fracture or focal lesion. Sinuses/Orbits: Preferential gaze to the right Other: Discussed with Dr. Ezzie Dural while in progress ASPECTS [facility] Stroke Program Early CT Score) - Ganglionic level infarction (caudate, lentiform nuclei,  internal capsule, insula, M1-M3 cortex): 7 - Supraganglionic infarction (M4-M6 cortex): 3 Total score (0-10 with 10 being normal): 10 IMPRESSION: 1. No acute finding. 2. Atrophy and chronic small  vessel ischemia with small chronic right frontal cortex infarcts. Electronically Signed   By: Tiburcio Pea M.D.   On: 06/06/2023 05:18    EKG NSR rate 60 normal intervals       Radiology No results found.  Procedures Procedures    Medications Ordered in ED Medications - No data to display  ED Course/ Medical Decision Making/ A&P                             Medical Decision Making Patient LSN 910 Pm awoke with weakness and speech deficit and then fell   Amount and/or Complexity of Data Reviewed Independent Historian: EMS    Details: See above  External Data Reviewed: notes.    Details: Previous notes reviewed  Labs: ordered.    Details: Normal sodium 138, normal potassium 3.9, normal creatinine 1.17, normal LFTs, normal white count 7.7 normal hemoglobin 12, normal platelet count. Negative ETOH.  Normal coags.   Radiology: ordered and independent interpretation performed.    Details: CT without bleed by me  ECG/medicine tests: ordered and independent interpretation performed. Decision-making details documented in ED Course. Discussion of management or test interpretation with external provider(s): Seen by Dr. Derry Lory. Admit to medicine.  MRI ordered.    Risk Prescription drug management.  Critical Care Total time providing critical care: 60 minutes (Code stroke and with bed side care )    Final Clinical Impression(s) / ED Diagnoses Final diagnoses:  None   The patient appears reasonably stabilized for admission considering the current resources, flow, and capabilities available in the ED at this time, and I doubt any other [facility] requiring further screening and/or treatment in the ED prior to admission.      Shadae Reino, MD 06/06/23 [phone]

## 2023-06-06 NOTE — Evaluation (Signed)
Clinical/Bedside Swallow Evaluation Patient Details  Name: Sarah Freeman MRN: [id number] Date of Birth: 03-13-1931  Today's Date: 06/06/2023 Time: SLP Start Time (ACUTE ONLY): 1055 SLP Stop Time (ACUTE ONLY): 1105 SLP Time Calculation (min) (ACUTE ONLY): 10 min  Past Medical History:  Past Medical History:  Diagnosis Date   Hyperlipidemia    Hypothyroid    Stroke [facility])    Urinary incontinence    Past Surgical History:  Past Surgical History:  Procedure Laterality Date   BREAST BIOPSY Left 2000?   neg. dr. R[place]ford Nail office   CHOLECYSTECTOMY  1964   COLONOSCOPY     Dr Maryruth Bun   DILATION AND CURETTAGE OF UTERUS     EYE SURGERY     LOOP RECORDER INSERTION N/A 02/09/2018   Procedure: LOOP RECORDER INSERTION;  Surgeon: Duke Salvia, MD;  Location: [facility] INVASIVE CV LAB;  Service: Cardiovascular;  Laterality: N/A;   SKIN CANCER EXCISION     face   TEE WITHOUT CARDIOVERSION N/A 02/09/2018   Procedure: TRANSESOPHAGEAL ECHOCARDIOGRAM (TEE);  Surgeon: Antonieta Iba, MD;  Location: [facility] ORS;  Service: Cardiovascular;  Laterality: N/A;   TOTAL ABDOMINAL HYSTERECTOMY W/ BILATERAL SALPINGOOPHORECTOMY     age 23   HPI:  Sarah Freeman is a 89 y.o. female who presented to [facility] via EMS as a Code Stroke with left sided weakness and R gaze deviation, after getting up to go to the bathroom in the middle of the night and falling.  MRI 7/8: "Acute infarct in the deep right cerebral white matter affecting  external capsule and corona radiata. Small separate right frontal  and parietal white matter infarcts."  Pt with medical history significant of dementia, prior CVA in 2017, hypertension, hyperlipidemia, bilateral carotid artery stenosis, hypothyroidism, stage III CKD, GERD, aortic atherosclerosis, and recent hospitalization in May 2024 and found to have a small acute infarct in the right corona radiata    Assessment / Plan / Recommendation  Clinical Impression  Pt presents with clinical  indicators of pharyngeal dysphagia in setting of new CVA.  Pt was unable to consisently follow directions for OME, but there was L asymmetry noted.  With thin liquid by straw, pt required multiple swallows and exhibited immediate cough.  There was slightly delayed cough with puree x1, but pt exhibited adequate oral clearance.  With regular solid, pt had difficutly biting cracker and only had a very small amount.  Pt was given soft solid trial by spoon.  There was trace L oral residue after the swallow.  Pt would benefit from instrumental swallowing assessment prior to initiation of oral diet.    Recommed pt remain NPO pending results of MBSS planned for later this date.   SLP Visit Diagnosis: Dysphagia, unspecified (R13.10)    Aspiration Risk  Moderate aspiration risk    Diet Recommendation NPO    Medication Administration: Via alternative means    Other  Recommendations Oral Care Recommendations: Oral care QID    Recommendations for follow up therapy are one component of a multi-disciplinary discharge planning process, led by the attending physician.  Recommendations may be updated based on patient status, additional functional criteria and insurance authorization.  Follow up Recommendations  (Pending results of MBS)      Assistance Recommended at Discharge  N/A  Functional Status Assessment  (Pending results of MBS)  Frequency and Duration  (Pending results of MBS)          Prognosis Prognosis for improved oropharyngeal function:  (Pending results of  MBS)      Swallow Study   General Date of Onset: 06/06/23 HPI: Sarah Freeman is a 89 y.o. female who presented to [facility] via EMS as a Code Stroke with left sided weakness and R gaze deviation, after getting up to go to the bathroom in the middle of the night and falling.  MRI 7/8: "Acute infarct in the deep right cerebral white matter affecting  external capsule and corona radiata. Small separate right frontal  and parietal white  matter infarcts."  Pt with medical history significant of dementia, prior CVA in 2017, hypertension, hyperlipidemia, bilateral carotid artery stenosis, hypothyroidism, stage III CKD, GERD, aortic atherosclerosis, and recent hospitalization in May 2024 and found to have a small acute infarct in the right corona radiata Type of Study: Bedside Swallow Evaluation Previous Swallow Assessment: None Diet Prior to this Study: NPO Respiratory Status: Room air History of Recent Intubation: No Behavior/Cognition: Alert;Cooperative Oral Cavity Assessment: Within Functional Limits Oral Care Completed by SLP: No Oral Cavity - Dentition: Dentures, top;Dentures, bottom Self-Feeding Abilities: Needs assist Patient Positioning: Upright in bed Baseline Vocal Quality: Normal Volitional Cough: Cognitively unable to elicit Volitional Swallow: Unable to elicit    Oral/Motor/Sensory Function Overall Oral Motor/Sensory Function: Mild impairment Facial ROM: Reduced left Facial Symmetry: Abnormal symmetry left Facial Strength: Reduced left Lingual Strength: Reduced   Ice Chips Ice chips: Not tested   Thin Liquid Thin Liquid: Impaired Pharyngeal  Phase Impairments: Multiple swallows;Cough - Immediate;Wet Vocal Quality    Nectar Thick Nectar Thick Liquid: Not tested   Honey Thick Honey Thick Liquid: Not tested   Puree Puree: Impaired Pharyngeal Phase Impairments: Cough - Delayed   Solid     Solid: Impaired Oral Phase Impairments: Impaired mastication Oral Phase Functional Implications: Oral residue;Prolonged oral transit;Left anterior spillage (L)      Kerrie Pleasure, MA, CCC-SLP Acute Rehabilitation Services Office: [phone] 06/06/2023,11:25 AM

## 2023-06-06 NOTE — Consult Note (Signed)
NEUROLOGY CONSULTATION NOTE   Date of service: June 06, 2023 Patient Name: Sarah Freeman MRN:  [id number] DOB:  [birth year] Reason for consult: "L sided weakness and R gaze deviation" Requesting Provider: Cy Blamer, MD _ _ _   _ __   _ __ _ _  __ __   _ __   __ _  History of Present Illness  Sarah Freeman is a 89 y.o. female with PMH significant for prior strokes, HLD, hypothyroidism, dementia who presents with left sided weakness and R gaze deviation.  Last seen normal at 2110 by her care taker when she went to bed. In the middle of the night, she got up at 320AM to go to the bathroom and called out for her care taker as she fell on the floor.  When care taker and family got to her, she was not moving her left side, only looking to her right. She had urinated on herself. They called EMS and she was brought in to the ED as a code stroke.  LKW: 2110 mRS: 3 tNKASE: not offered, recent stroke on 04/06/23 and within the last 3 months and thus not a candidate for tnkase. Thrombectomy: not offered, no LVO NIHSS components Score: Comment  1a Level of Conscious 0[x]  1[]  2[]  3[]      1b LOC Questions 0[]  1[x]  2[]       1c LOC Commands 0[x]  1[]  2[]       2 Best Gaze 0[]  1[]  2[x]       3 Visual 0[]  1[]  2[x]  3[]      4 Facial Palsy 0[]  1[]  2[x]  3[]      5a Motor Arm - left 0[]  1[]  2[x]  3[]  4[]  UN[]    5b Motor Arm - Right 0[x]  1[]  2[]  3[]  4[]  UN[]    6a Motor Leg - Left 0[]  1[]  2[x]  3[]  4[]  UN[]    6b Motor Leg - Right 0[x]  1[]  2[]  3[]  4[]  UN[]    7 Limb Ataxia 0[x]  1[]  2[]  3[]  UN[]     8 Sensory 0[]  1[x]  2[]  UN[]      9 Best Language 0[x]  1[]  2[]  3[]      10 Dysarthria 0[]  1[x]  2[]  UN[]      11 Extinct. and Inattention 0[]  1[x]  2[]       TOTAL: 14      ROS   Unable to obtain detailed ROS given the acuity of the situation.  Past History   Past Medical History:  Diagnosis Date   Hyperlipidemia    Hypothyroid    Stroke [facility])    Urinary incontinence    Past Surgical History:  Procedure  Laterality Date   BREAST BIOPSY Left 2000?   neg. dr. [place] Nail office   CHOLECYSTECTOMY  1964   COLONOSCOPY     Dr Maryruth Bun   DILATION AND CURETTAGE OF UTERUS     EYE SURGERY     LOOP RECORDER INSERTION N/A 02/09/2018   Procedure: LOOP RECORDER INSERTION;  Surgeon: Duke Salvia, MD;  Location: [facility] INVASIVE CV LAB;  Service: Cardiovascular;  Laterality: N/A;   SKIN CANCER EXCISION     face   TEE WITHOUT CARDIOVERSION N/A 02/09/2018   Procedure: TRANSESOPHAGEAL ECHOCARDIOGRAM (TEE);  Surgeon: Antonieta Iba, MD;  Location: [facility] ORS;  Service: Cardiovascular;  Laterality: N/A;   TOTAL ABDOMINAL HYSTERECTOMY W/ BILATERAL SALPINGOOPHORECTOMY     age 9   Family History  Problem Relation Age of Onset   Cancer Father        prostate   Stroke Father  Hypertension Sister    Hyperlipidemia Daughter    Heart disease Son    Diabetes Maternal Grandmother    Heart disease Maternal Grandmother    Heart disease Maternal Grandfather    Heart disease Daughter    CAD Mother    Failure to thrive Mother    Breast cancer Neg Hx    Social History   Socioeconomic History   Marital status: Widowed    Spouse name: Not on file   Number of children: Not on file   Years of education: Not on file   Highest education level: Not on file  Occupational History   Occupation: retired  Tobacco Use   Smoking status: Never    Passive exposure: Never   Smokeless tobacco: Never  Vaping Use   Vaping Use: Never used  Substance and Sexual Activity   Alcohol use: No    Alcohol/week: 0.0 standard drinks of alcohol   Drug use: No   Sexual activity: Not Currently  Other Topics Concern   Not on file  Social History Narrative   Not on file   Social Determinants of Health   Financial Resource Strain: Low Risk  (02/01/2023)   Overall Financial Resource Strain (CARDIA)    Difficulty of Paying Living Expenses: Not hard at all  Food Insecurity: No Food Insecurity (04/06/2023)   Hunger Vital Sign     Worried About Running Out of Food in the Last Year: Never true    Ran Out of Food in the Last Year: Never true  Transportation Needs: No Transportation Needs (04/06/2023)   PRAPARE - Administrator, Civil Service (Medical): No    Lack of Transportation (Non-Medical): No  Physical Activity: Insufficiently Active (02/01/2023)   Exercise Vital Sign    Days of Exercise per Week: 4 days    Minutes of Exercise per Session: 10 min  Stress: No Stress Concern Present (02/01/2023)   [employer] of Occupational Health - Occupational Stress Questionnaire    Feeling of Stress : Not at all  Social Connections: Moderately Isolated (02/01/2023)   Social Connection and Isolation Panel [NHANES]    Frequency of Communication with Friends and Family: More than three times a week    Frequency of Social Gatherings with Friends and Family: More than three times a week    Attends Religious Services: More than 4 times per year    Active Member of [employer] or Organizations: No    Attends Banker Meetings: Never    Marital Status: Widowed   Allergies  Allergen Reactions   Statins Other (See Comments)    Leg cramps Leg cramps   Bacitracin    Neomycin    Neosporin + Pain Relief Max St  [Neomy-Bacit-Polymyx-Pramoxine]    Polymyxin B    Neomycin-Polymyxin-Gramicidin Other (See Comments)    Pt states wounds do not heal with neosporin Pt states wounds do not heal with neosporin    Medications  (Not in a hospital admission)    Vitals   Vitals:   06/06/23 0400  Weight: 77.4 kg  Height: 4\' 8"  (1.422 m)     Body mass index is 38.26 kg/m.  Physical Exam   General: Laying comfortably in bed; in no acute distress.  HENT: Normal oropharynx and mucosa. Normal external appearance of ears and nose.  Neck: Supple, no pain or tenderness  CV: No JVD. No peripheral edema.  Pulmonary: Symmetric Chest rise. Normal respiratory effort.  Abdomen: Soft to touch, non-tender.  Ext: No  cyanosis,  edema, or deformity  Skin: No rash. Normal palpation of skin.   Musculoskeletal: Normal digits and nails by inspection. No clubbing.   Neurologic Examination  Mental status/Cognition: Alert, oriented to self, but not to place, month and year, poor attention.  Speech/language: dysarthric speech, Fluent, comprehension intact, object naming intact, repetition intact. Cranial nerves:   CN II Pupils equal and reactive to light, L hemianopsia   CN III,IV,VI R gaz deviation, does not cross midline.   CN V normal sensation in V1, V2, and V3 segments bilaterally   CN VII L facial droop   CN VIII normal hearing to speech    CN IX & X normal palatal elevation, no uvular deviation    CN XI 5/5 head turn and 5/5 shoulder shrug bilaterally    CN XII midline tongue protrusion    Motor:  Muscle bulk: normal, tone flaccid in LUE. Mvmt Root Nerve  Muscle Right Left Comments  SA C5/6 Ax Deltoid  3   EF C5/6 Mc Biceps 5 3   EE C6/7/8 Rad Triceps 5 3   WF C6/7 Med FCR     WE C7/8 PIN ECU     F Ab C8/T1 U ADM/FDI 5 3   HF L1/2/3 Fem Illopsoas 4+ 3   KE L2/3/4 Fem Quad     DF L4/5 D Peron Tib Ant 5 3   PF S1/2 Tibial Grc/Sol 5 3    Sensation:  Light touch Decreased on the left with some sensory extinction.   Pin prick    Temperature    Vibration   Proprioception    Coordination/Complex Motor:  - Finger to Nose intact on the right - Heel to shin unable to get her to do - Rapid alternating movement are slowed on the right, unable to do with left. - Gait: deferred for patient safett.  Labs   CBC:  Recent Labs  Lab 06/06/23 0450  HGB 12.6  HCT 37.0    Basic Metabolic Panel:  Lab Results  Component Value Date   NA 138 06/06/2023   K 3.9 06/06/2023   CO2 24 05/17/2023   GLUCOSE 98 06/06/2023   BUN 9 06/06/2023   CREATININE 1.00 06/06/2023   CALCIUM 9.5 05/17/2023   GFRNONAA >60 04/18/2023   GFRAA 77 06/16/2020   Lipid Panel:  Lab Results  Component Value Date   LDLCALC 78  05/17/2023   HgbA1c:  Lab Results  Component Value Date   HGBA1C 6.0 (H) 04/06/2023   Urine Drug Screen:     Component Value Date/Time   LABOPIA NONE DETECTED 01/18/2018 1925   COCAINSCRNUR NONE DETECTED 01/18/2018 1925   LABBENZ NONE DETECTED 01/18/2018 1925   AMPHETMU NONE DETECTED 01/18/2018 1925   THCU NONE DETECTED 01/18/2018 1925   LABBARB NONE DETECTED 01/18/2018 1925    Alcohol Level     Component Value Date/Time   ETH <10 04/06/2023 1136    CT Head without contrast(Personally reviewed): CTH was negative for a large hypodensity concerning for a large territory infarct or hyperdensity concerning for an ICH  CT angio Head and Neck with contrast(Personally reviewed): No LVO  CT Perfusion(Personally reviewed): Delayed perfusion in he R MCA which could be attributed to R ICA 50-55% stenosis but this is chronic and noted on prior vessel imaging too  MRI Brain: pending  rEEG:  pending  Impression   Sarah Freeman is a 89 y.o. female with PMH significant for prior strokes, HLD, hypothyroidism, dementia who presents with  left sided weakness and R gaze deviation. Slightly improved when  examined her again in the ED but still significant deficit. Either had a stroke with initial LVO but subsequent breakdown of thrombus before CTA or could have had a seizure. I suspect former but she does seem to have prior cortical R MCA stroke which could put her at risk for seizure. However, no obvious jerking concerning for seizure noted during my evaluation or endorsed by family.  Recommendations  - MRI Brain without contrast - LTM EEG - given Keppra load of 2000mg  IV once. - we will continue to follow. ______________________________________________________________________ This patient is critically ill and at significant risk of neurological worsening, death and care requires constant monitoring of vital signs, hemodynamics,respiratory and cardiac monitoring, neurological  assessment, discussion with family, other specialists and medical decision making of high complexity. I spent 45 minutes of neurocritical care time  in the care of  this patient. This was time spent independent of any time provided by nurse practitioner or PA.  Erick Blinks [facility] 06/06/2023  6:50 AM   Thank you for the opportunity to take part in the care of this patient. If you have any further questions, please contact the neurology consultation attending.  Signed,  Erick Blinks [facility] _ _ _   _ __   _ __ _ _  __ __   _ __   __ _

## 2023-06-06 NOTE — Progress Notes (Signed)
Modified Barium Swallow Study  Patient Details  Name: Sarah Freeman MRN: [id number] Date of Birth: [birth year]  Today's Date: 06/06/2023  Modified Barium Swallow completed.  Full report located under Chart Review in the Imaging Section.  History of Present Illness Sarah Freeman is a 89 y.o. female who presented to [facility] via EMS as a Code Stroke with left sided weakness and R gaze deviation, after getting up to go to the bathroom in the middle of the night and falling.  MRI 7/8: "Acute infarct in the deep right cerebral white matter affecting  external capsule and corona radiata. Small separate right frontal  and parietal white matter infarcts."  Pt with medical history significant of dementia, prior CVA in 2017, hypertension, hyperlipidemia, bilateral carotid artery stenosis, hypothyroidism, stage III CKD, GERD, aortic atherosclerosis, and recent hospitalization in May 2024 and found to have a small acute infarct in the right corona radiata   Clinical Impression Pt presents with an overall functional oropharyngeal swallow. Generalized oral weakness and acute L sided asymmetry contribute to instances of anterior spillage and trace residue, which lines the tongue and eventually clears with subsequent swallows. Pt had one instance of sensed aspiration during the initial teaspoonful of thin liquids that was independently ejected with a cough (PAS 6) in which the bolus spilled over the arytenoids during the swallow prior to complete closure of the laryngeal vestibule. No other instances of penetration or aspiration occurred with subsequent challenging sips of thin liquids, nectar thick liquids, honey thick liquids, or purees. When cued to take a bite of the graham cracker with purees, pt only took the puree; therefore solids were unable to be tested today. The pill was administered with thin liquids via straw and passed without difficulty. Pt may benefit from continued SLP f/u to address potential to  upgrade diet and target functional compensatory strategies. Recommend initiating diet of Dys 1 textures with thin liquids and meds given whole with liquids as mentation allows (or crushed in puree PRN). Recommend full superivison during meal times. Factors that may increase risk of adverse event in presence of aspiration [name]): Poor general health and/or compromised immunity;Reduced cognitive function;Limited mobility;Frail or deconditioned  Swallow Evaluation Recommendations Recommendations: PO diet PO Diet Recommendation: Dysphagia 1 (Pureed);Thin liquids (Level 0) Liquid Administration via: Straw;Cup Medication Administration: Whole meds with liquid Supervision: Full supervision/cueing for swallowing strategies Swallowing strategies  : Minimize environmental distractions;Slow rate;Small bites/sips;Check for anterior loss Postural changes: Position pt fully upright for meals Oral care recommendations: Oral care BID (2x/day)      Gwynneth Aliment, M.A., CF-SLP Speech Language Pathology, Acute Rehabilitation Services  Secure Chat preferred [phone]  06/06/2023,3:03 PM

## 2023-06-06 NOTE — ED Notes (Signed)
Patient transported to MRI 

## 2023-06-06 NOTE — ED Triage Notes (Signed)
Patient BIB EMS from home due to stroke like symptoms. EMS reports left sided deficits, slurred speech, and right sided gaze. EMS reports patient fells in bathroom, is on thinners. LKW 9:10 PM. Patient alert.

## 2023-06-06 NOTE — H&P (Addendum)
History and Physical    Patient: Sarah Freeman ZOX:[id number] DOB: 04-15-1931 DOA: 06/06/2023 DOS: the patient was seen and examined on 06/06/2023 PCP: Dorcas Carrow, DO  Patient coming from: Home  Chief Complaint:  Chief Complaint  Patient presents with   Code Stroke   HPI: Sarah Freeman is a 89 y.o. female with medical history significant of dementia, prior CVA in 2017, hypertension, hyperlipidemia, bilateral carotid artery stenosis, hypothyroidism, stage III CKD, GERD, aortic atherosclerosis, and recent hospitalization in May 2024 and found to have a small acute infarct in the right corona radiata.    She presented to [facility] after getting up to go to the bathroom in the middle of the night and falling. She called out to her care taker as she fell on the floor. When care taker and family got to her, she was not moving her left side,  looking only to the right, and she had an episode of urinary incontinence. EMS was called and she arrived to [facility] as a Code Stroke with left sided weakness and R gaze deviation.   ED Course: In the ED CT head code stroke showed no acute findings, CT angio head and neck with no emergent LVO, no infarct or ischemia, and CT Cerebral perfusion also negative. Seen by Neurology overnight, they suspect patient had a stroke with LVO and breakdown of thrombus prior to imaging. Because the patient had a prior MCA stroke putting her at increased risk of seizure that is also on the differential and she was loaded with 2g of Keppra. [facility] contacted for admission.  Update: MRI Brain showed "Acute infarct in the deep right cerebral white matter affecting external capsule and corona radiata. Small separate right frontal and parietal white matter infarcts"  Review of Systems: unable to review all systems due to the inability of the patient to answer questions. Past Medical History:  Diagnosis Date   Hyperlipidemia    Hypothyroid    Stroke [facility])    Urinary incontinence     Past Surgical History:  Procedure Laterality Date   BREAST BIOPSY Left 2000?   neg. dr. Rutherford Nail office   CHOLECYSTECTOMY  1964   COLONOSCOPY     Dr Maryruth Bun   DILATION AND CURETTAGE OF UTERUS     EYE SURGERY     LOOP RECORDER INSERTION N/A 02/09/2018   Procedure: LOOP RECORDER INSERTION;  Surgeon: Duke Salvia, MD;  Location: [facility] INVASIVE CV LAB;  Service: Cardiovascular;  Laterality: N/A;   SKIN CANCER EXCISION     face   TEE WITHOUT CARDIOVERSION N/A 02/09/2018   Procedure: TRANSESOPHAGEAL ECHOCARDIOGRAM (TEE);  Surgeon: Antonieta Iba, MD;  Location: [facility] ORS;  Service: Cardiovascular;  Laterality: N/A;   TOTAL ABDOMINAL HYSTERECTOMY W/ BILATERAL SALPINGOOPHORECTOMY     age 4   Social History:  reports that she has never smoked. She has never been exposed to tobacco smoke. She has never used smokeless tobacco. She reports that she does not drink alcohol and does not use drugs.  Allergies  Allergen Reactions   Statins Other (See Comments)    Leg cramps    Bacitracin Other (See Comments)    Doesn't help wound to heal   Neomycin-Polymyxin-Gramicidin Other (See Comments)    Pt states wounds do not heal with neosporin     Family History  Problem Relation Age of Onset   Cancer Father        prostate   Stroke Father    Hypertension Sister  Hyperlipidemia Daughter    Heart disease Son    Diabetes Maternal Grandmother    Heart disease Maternal Grandmother    Heart disease Maternal Grandfather    Heart disease Daughter    CAD Mother    Failure to thrive Mother    Breast cancer Neg Hx     Prior to Admission medications   Medication Sig Start Date End Date Taking? Authorizing Provider  aspirin EC 81 MG tablet Take 81 mg by mouth daily. Swallow whole.   Yes [provider]  cholecalciferol (VITAMIN D3) 25 MCG (1000 UNIT) tablet Take 1,000 Units by mouth daily.   Yes [provider]  clopidogrel (PLAVIX) 75 MG tablet Take 1 tablet (75 mg total)  by mouth daily. 01/04/23  Yes Johnson, Megan P, DO  cyanocobalamin 1000 MCG tablet Take 1 tablet (1,000 mcg total) by mouth daily. 04/19/23  Yes Amin, Ankit Chirag, MD  donepezil (ARICEPT) 5 MG tablet Take 1 tablet (5 mg total) by mouth at bedtime. Patient taking differently: Take 5 mg by mouth daily. 01/04/23  Yes Johnson, Megan P, DO  gabapentin (NEURONTIN) 100 MG capsule TAKE 2 CAPSULE(100 MG) BY MOUTH IN THE AM and 1 AT PM Patient taking differently: Take 2 mg by mouth daily. 01/04/23  Yes Johnson, Megan P, DO  levothyroxine (SYNTHROID) 50 MCG tablet Take 1 tablet (50 mcg total) by mouth daily before breakfast. 01/10/23  Yes Johnson, Megan P, DO  nitrofurantoin, macrocrystal-monohydrate, (MACROBID) 100 MG capsule Take 1 capsule (100 mg total) by mouth at bedtime. 11/08/22  Yes MacDiarmid, Lorin Picket, MD  pantoprazole (PROTONIX) 40 MG tablet Take 1 tablet (40 mg total) by mouth daily. 05/17/23  Yes Johnson, Megan P, DO  polyethylene glycol (MIRALAX / GLYCOLAX) 17 g packet Take 17 g by mouth daily.   Yes [provider]  PROLIA 60 MG/ML SOSY injection Inject 60 mg into the skin every 6 (six) months. 05/22/20  Yes [provider]  QUEtiapine (SEROQUEL) 25 MG tablet Take 0.5-1 tablets (12.5-25 mg total) by mouth every evening. Patient taking differently: Take 25 mg by mouth every evening. 05/17/23  Yes Johnson, Megan P, DO  simvastatin (ZOCOR) 20 MG tablet Take 1 tablet (20 mg total) by mouth daily at 6 PM. Patient taking differently: Take 20 mg by mouth daily. 05/17/23  Yes Dorcas Carrow, DO    Physical Exam: Vitals:   06/06/23 0400 06/06/23 0435 06/06/23 0436  BP:   (!) 140/80  Pulse:   67  Resp:   18  Temp:   98 F (36.7 C)  TempSrc:   Oral  SpO2:  98% 100%  Weight: 77.4 kg    Height: 4\' 8"  (1.422 m)      Constitutional: NAD, calm, comfortable Eyes: PERRL, lids and conjunctivae normal ENMT: Mucous membranes are moist. Posterior pharynx clear of any exudate or lesions.Upper  dentures in place.  Neck: normal, supple, no masses, no thyromegaly Respiratory: clear to auscultation bilaterally, no wheezing, no crackles. Normal respiratory effort. No accessory muscle use.  Cardiovascular: Regular rate and rhythm, no murmurs / rubs / gallops. No extremity edema. 2+ pedal pulses. No carotid bruits.  Abdomen: no tenderness, no masses palpated. No hepatosplenomegaly. Bowel sounds positive.  Musculoskeletal: no clubbing / cyanosis. No joint deformity upper and lower extremities. Good ROM, no contractures. Normal muscle tone.  Skin: no rashes, lesions, ulcers. No induration Neurologic: Alert, dysarthric, (L) side weakness. Oriented to self,recognizes children at bedside and able to tell me their names.  Data Reviewed:  CBC    Component Value Date/Time   WBC 7.7 06/06/2023 0447   RBC 3.85 (L) 06/06/2023 0447   HGB 12.6 06/06/2023 0450   HGB 12.6 05/17/2023 1524   HCT 37.0 06/06/2023 0450   HCT 37.2 05/17/2023 1524   PLT 268 06/06/2023 0447   PLT 322 05/17/2023 1524   MCV 95.6 06/06/2023 0447   MCV 93 05/17/2023 1524   MCH 31.2 06/06/2023 0447   MCHC 32.6 06/06/2023 0447   RDW 13.7 06/06/2023 0447   RDW 13.3 05/17/2023 1524   LYMPHSABS 2.9 06/06/2023 0447   LYMPHSABS 3.3 (H) 05/17/2023 1524   MONOABS 0.9 06/06/2023 0447   EOSABS 0.5 06/06/2023 0447   EOSABS 0.4 05/17/2023 1524   BASOSABS 0.1 06/06/2023 0447   BASOSABS 0.1 05/17/2023 1524   Differential  Result Value Ref Range    Neutrophils Relative % 43 %    Neutro Abs 3.3 1.7 - 7.7 K/uL    Lymphocytes Relative 38 %    Lymphs Abs 2.9 0.7 - 4.0 K/uL    Monocytes Relative 12 %    Monocytes Absolute 0.9 0.1 - 1.0 K/uL    Eosinophils Relative 6 %    Eosinophils Absolute 0.5 0.0 - 0.5 K/uL    Basophils Relative 1 %    Basophils Absolute 0.1 0.0 - 0.1 K/uL    Immature Granulocytes 0 %    Abs Immature Granulocytes 0.01 0.00 - 0.07 K/uL    CMP     Component Value Date/Time   NA 138 06/06/2023 0450   NA  136 05/17/2023 1524   K 3.9 06/06/2023 0450   CL 103 06/06/2023 0450   CO2 25 06/06/2023 0447   GLUCOSE 98 06/06/2023 0450   BUN 9 06/06/2023 0450   BUN 8 (L) 05/17/2023 1524   CREATININE 1.00 06/06/2023 0450   CALCIUM 8.8 (L) 06/06/2023 0447   PROT 6.4 (L) 06/06/2023 0447   PROT 7.1 05/17/2023 1524   ALBUMIN 2.9 (L) 06/06/2023 0447   ALBUMIN 3.9 05/17/2023 1524   AST 43 (H) 06/06/2023 0447   ALT 38 06/06/2023 0447   ALKPHOS 51 06/06/2023 0447   BILITOT 0.5 06/06/2023 0447   BILITOT 0.3 05/17/2023 1524   EGFR 57 (L) 05/17/2023 1524   GFRNONAA 56 (L) 06/06/2023 0447    PT/INR    Component Value Date/Time   PROTHROMIN TIME INR 13.8 1.0 06/06/2023 0447    APTT    Component Value Date/Time   APTT 26 06/06/2023 0447    Alcohol Level    Component Value Date/Time   ETH <10 06/06/2023 0447     MR BRAIN WO CONTRAST  Result Date: 06/06/2023 CLINICAL DATA:  Neuro deficit with acute stroke suspected EXAM: MRI HEAD WITHOUT CONTRAST TECHNIQUE: Multiplanar, multiecho pulse sequences of the brain and surrounding structures were obtained without intravenous contrast. COMPARISON:  Head CT and CTA from earlier today. Brain MRI 04/06/2023 FINDINGS: Brain: Confluent restricted diffusion in the right corona radiata and external capsule. Separate area of small restricted diffusion in the right superior frontal and parietal white matter. Background of extensive chronic small vessel ischemia. Small chronic right cerebral cortex infarcts. No hemorrhage, hydrocephalus, or collection. Vascular: Major flow voids are preserved.  There was preceding CTA. Skull and upper cervical spine: Normal marrow signal Sinuses/Orbits: Bilateral cataract resection. Partial right mastoid opacification. IMPRESSION: 1. Acute infarct in the deep right cerebral white matter affecting external capsule and corona radiata. Small separate right frontal and parietal white matter infarcts.  2. Extensive chronic small vessel  disease and small chronic right frontal cortex infarcts. Electronically Signed   By: Tiburcio Pea M.D.   On: 06/06/2023 08:03   CT ANGIO HEAD NECK W WO CM (CODE STROKE)  Result Date: 06/06/2023 CLINICAL DATA:  Code stroke.  Left-sided weakness EXAM: CT ANGIOGRAPHY HEAD AND NECK CT PERFUSION BRAIN TECHNIQUE: Multidetector CT imaging of the head and neck was performed using the standard protocol during bolus administration of intravenous contrast. Multiplanar CT image reconstructions and MIPs were obtained to evaluate the vascular anatomy. Carotid stenosis measurements (when applicable) are obtained utilizing NASCET criteria, using the distal internal carotid diameter as the denominator. Multiphase CT imaging of the brain was performed following IV bolus contrast injection. Subsequent parametric perfusion maps were calculated using RAPID software. RADIATION DOSE REDUCTION: This exam was performed according to the departmental dose-optimization program which includes automated exposure control, adjustment of the mA and/or kV according to patient size and/or use of iterative reconstruction technique. CONTRAST:  OMNIPAQUE IOHEXOL 350 MG/ML SOLN COMPARISON:  MRA of the head and neck 04/06/2023 FINDINGS: CTA NECK FINDINGS Aortic arch: Atheromatous plaque with 3 vessel branching. No acute finding or dilatation Right carotid system: Mixed density plaque primarily at the bifurcation. Proximal ICA stenosis measures 50% on coronal reformats. No ulceration or dissection. Left carotid system: Calcified plaque at the bifurcation without stenosis or ulceration. Vertebral arteries: Proximal subclavian atherosclerosis without flow reducing stenosis. The left vertebral artery is dominant the smoothly contoured, and widely patent. Skeleton: Generalized cervical spine degeneration with multilevel anterolisthesis. Other neck: No acute finding Upper chest: No acute finding Review of the MIP images confirms the above findings  CTA HEAD FINDINGS Anterior circulation: Atheromatous calcification of the carotid siphons. No branch occlusion, beading, or aneurysm. No proximal flow limiting stenosis. Posterior circulation: Left dominant vertebral artery. The vertebral and basilar arteries are widely patent. High-grade narrowing at the left PCA bifurcation although no major branch occlusion. No beading or aneurysm Venous sinuses: Unremarkable for the arterial phase Anatomic variants: None significant Review of the MIP images confirms the above findings CT Brain Perfusion Findings: ASPECTS: 10 CBF (<30%) Volume: 0mL Perfusion (Tmax>6.0s) volume: 0mL With very strict time parameters there is relatively delayed perfusion in the right MCA upper division and watershed territories. IMPRESSION: 1. No emergent large vessel occlusion. 2. No infarct or ischemia by standard threshold CT perfusion. With very strict time parameters there is relatively delayed perfusion to the upper division right MCA territory and watershed which may be related to a 50-55% right ICA origin stenosis. 3. High-grade left PCA branch narrowing. Electronically Signed   By: Tiburcio Pea M.D.   On: 06/06/2023 05:27   CT CEREBRAL PERFUSION W CONTRAST  Result Date: 06/06/2023 CLINICAL DATA:  Code stroke.  Left-sided weakness EXAM: CT ANGIOGRAPHY HEAD AND NECK CT PERFUSION BRAIN TECHNIQUE: Multidetector CT imaging of the head and neck was performed using the standard protocol during bolus administration of intravenous contrast. Multiplanar CT image reconstructions and MIPs were obtained to evaluate the vascular anatomy. Carotid stenosis measurements (when applicable) are obtained utilizing NASCET criteria, using the distal internal carotid diameter as the denominator. Multiphase CT imaging of the brain was performed following IV bolus contrast injection. Subsequent parametric perfusion maps were calculated using RAPID software. RADIATION DOSE REDUCTION: This exam was performed  according to the departmental dose-optimization program which includes automated exposure control, adjustment of the mA and/or kV according to patient size and/or use of iterative reconstruction technique. CONTRAST:  OMNIPAQUE  IOHEXOL 350 MG/ML SOLN COMPARISON:  MRA of the head and neck 04/06/2023 FINDINGS: CTA NECK FINDINGS Aortic arch: Atheromatous plaque with 3 vessel branching. No acute finding or dilatation Right carotid system: Mixed density plaque primarily at the bifurcation. Proximal ICA stenosis measures 50% on coronal reformats. No ulceration or dissection. Left carotid system: Calcified plaque at the bifurcation without stenosis or ulceration. Vertebral arteries: Proximal subclavian atherosclerosis without flow reducing stenosis. The left vertebral artery is dominant the smoothly contoured, and widely patent. Skeleton: Generalized cervical spine degeneration with multilevel anterolisthesis. Other neck: No acute finding Upper chest: No acute finding Review of the MIP images confirms the above findings CTA HEAD FINDINGS Anterior circulation: Atheromatous calcification of the carotid siphons. No branch occlusion, beading, or aneurysm. No proximal flow limiting stenosis. Posterior circulation: Left dominant vertebral artery. The vertebral and basilar arteries are widely patent. High-grade narrowing at the left PCA bifurcation although no major branch occlusion. No beading or aneurysm Venous sinuses: Unremarkable for the arterial phase Anatomic variants: None significant Review of the MIP images confirms the above findings CT Brain Perfusion Findings: ASPECTS: 10 CBF (<30%) Volume: 0mL Perfusion (Tmax>6.0s) volume: 0mL With very strict time parameters there is relatively delayed perfusion in the right MCA upper division and watershed territories. IMPRESSION: 1. No emergent large vessel occlusion. 2. No infarct or ischemia by standard threshold CT perfusion. With very strict time parameters there is  relatively delayed perfusion to the upper division right MCA territory and watershed which may be related to a 50-55% right ICA origin stenosis. 3. High-grade left PCA branch narrowing. Electronically Signed   By: Tiburcio Pea M.D.   On: 06/06/2023 05:27   CT HEAD CODE STROKE WO CONTRAST  Result Date: 06/06/2023 CLINICAL DATA:  Code stroke.  Left-sided weakness EXAM: CT HEAD WITHOUT CONTRAST TECHNIQUE: Contiguous axial images were obtained from the base of the skull through the vertex without intravenous contrast. RADIATION DOSE REDUCTION: This exam was performed according to the departmental dose-optimization program which includes automated exposure control, adjustment of the mA and/or kV according to patient size and/or use of iterative reconstruction technique. COMPARISON:  Head CT 04/06/2023 FINDINGS: Brain: No evidence of acute infarction, hemorrhage, hydrocephalus, extra-axial collection or mass lesion/mass effect. Small patchy areas of chronic cortically based infarct along the superior right frontal lobe best seen on sagittal reformats. Generalized cerebral volume loss and chronic small vessel ischemia. Vascular: No hyperdense vessel or unexpected calcification. Right MCA branch region calcification is chronic. Skull: Normal. Negative for fracture or focal lesion. Sinuses/Orbits: Preferential gaze to the right Other: Discussed with Dr. Ezzie Dural while in progress ASPECTS [facility] Stroke Program Early CT Score) - Ganglionic level infarction (caudate, lentiform nuclei, internal capsule, insula, M1-M3 cortex): 7 - Supraganglionic infarction (M4-M6 cortex): 3 Total score (0-10 with 10 being normal): 10 IMPRESSION: 1. No acute finding. 2. Atrophy and chronic small vessel ischemia with small chronic right frontal cortex infarcts. Electronically Signed   By: Tiburcio Pea M.D.   On: 06/06/2023 05:18       EKG interpretation: NSR with HR of 60bpm.  Assessment and Plan: # Cerebrovascular Accident # Rule  out Seizure MRI Brain WO showed acute infarct in the deep right cerebral white matter affecting external capsule and corona radiata. Small separate right frontal and parietal white matter infarcts. - Permissive HTN goal B/P <220/110. PRN hydralazine if BP above these parameters. - Recent ECHO completed 04/06/2023 - ASA + Plavix per neuro - A1C 6.0 in May  - Lipid panel -  Started on Simvastatin 20 mg Daily during May admission, family reports compliance and she has not had side effects/leg cramping, will trial low dose Crestor 5 mg since she has had another CVA on ASA+Plavix - q4H CBG monitoring - Neuro checks - STAT head CT for any change in neuro exam - Monitor on Telemetry - PT/OT/SLP  - Neurology consulted, appreciate their recommendations and management  #Hypertension - Hold home antihypertensives to allow for permissive hypertension  #Chronic Kidney Disease, Stage 3 Creatinine 1.0 on BMP this morning, baseline ~0.9. - Daily BMP - mIVF at 75 mL/hr while NPO  #Chronic UTI - Continue Home Macrobid  #Memory Loss Likely vascular dementia in setting of multiple prior strokes and small vessel disease seen on  - Continue home Aricept  #Hypothyroidism - Continue home levothyroxine  VTE prophylaxis:  Lovenox GI prophylaxis: Protonix Diet: NPO pending Swallow Study Access: PIV Lines: NONE Code Status:  Telemetry: Yes Disposition: Admit to progressive   Advance Care Planning: Code Status: DNR  Consults: Neurology  Family Communication: Son and Daughter at bedside  Severity of Illness: The appropriate patient status for this patient is INPATIENT. Inpatient status is judged to be reasonable and necessary in order to provide the required intensity of service to ensure the patient's safety. The patient's presenting symptoms, physical exam findings, and initial radiographic and laboratory data in the context of their chronic comorbidities is felt to place them at high risk for  further clinical deterioration. Furthermore, it is not anticipated that the patient will be medically stable for discharge from the hospital within 2 midnights of admission.   * I certify that at the point of admission it is my clinical judgment that the patient will require inpatient hospital care spanning beyond 2 midnights from the point of admission due to high intensity of service, high risk for further deterioration and high frequency of surveillance required.*  To reach the provider On-Call:   7AM- 7PM see care teams to locate the attending and reach out to them via [email or web address].  7PM-7AM contact night-coverage If you still have difficulty reaching the appropriate provider, please page the [facility] (Director on Call) for [facility] on amion for assistance  This document was prepared using Sales executive software and may include unintentional dictation errors.  Bishop Limbo FNP-BC, PMHNP-BC Nurse Practitioner [facility] [place]  =====================================================  Assessment & Plan:   Principal Problem:   Stroke-like symptoms Active Problems:   Hypothyroidism   Statin intolerance   Chronic UTI   Chronic kidney disease, stage 3 (HCC)   Dyslipidemia  Agree with the above assessment and plan; briefly patient presented to the hospital with history of recent CVA just 2 months prior with similar symptoms of left sided weakness noted after a fall (likely cause by the weakness).  CT unremarkable at intake but MRI confirms new acute CVA - neurology consulted - EEG unremarkable for seizures. Defer to neuro on further medication changes - already on asa/plavix from stroke 2 months ago - unclear if patient would benefit from increased regimen or if risks of bleeding with falls would outweigh any benefit from antiplatelets/anticoagulation.  Noted ongoing left sided weakness/drift, dysarthria, alert oriented to person/family.   Time spent:   Azucena Fallen, DO [facility]  If 7PM-7AM, please contact night-coverage [email or web address]  06/06/2023, 4:13 PM

## 2023-06-07 DIAGNOSIS — R2990 Unspecified symptoms and signs involving the nervous system: Secondary | ICD-10-CM | POA: Diagnosis not present

## 2023-06-07 LAB — GLUCOSE, CAPILLARY
Glucose-Capillary: 74 mg/dL (ref 70–99)
Glucose-Capillary: 85 mg/dL (ref 70–99)
Glucose-Capillary: 132 mg/dL — ABNORMAL HIGH (ref 70–99)
Glucose-Capillary: 107 mg/dL — ABNORMAL HIGH (ref 70–99)
Glucose-Capillary: 136 mg/dL — ABNORMAL HIGH (ref 70–99)

## 2023-06-07 NOTE — Progress Notes (Addendum)
STROKE TEAM PROGRESS NOTE   INTERVAL HISTORY Her family is at the bedside.  Neurological exam is stable and unchanged No new events overnight   Vitals:   06/06/23 2329 06/07/23 0410 06/07/23 0942 06/07/23 1213  BP: (!) 160/83 (!) 148/87 132/70 120/64  Pulse: 76 (!) 47 67 77  Resp:   20 20  Temp: 97.8 F (36.6 C) 97.6 F (36.4 C) 97.7 F (36.5 C) 98.3 F (36.8 C)  TempSrc: Oral Oral Oral Oral  SpO2: (!) 81% (!) 75% 99% 100%  Weight:      Height:       CBC:  Recent Labs  Lab 06/06/23 0447 06/06/23 0450  WBC 7.7  --   NEUTROABS 3.3  --   HGB 12.0 12.6  HCT 36.8 37.0  MCV 95.6  --   PLT 268  --     Basic Metabolic Panel:  Recent Labs  Lab 06/06/23 0447 06/06/23 0450  NA 137 138  K 3.8 3.9  CL 102 103  CO2 25  --   GLUCOSE 101* 98  BUN 9 9  CREATININE 0.97 1.00  CALCIUM 8.8*  --     Lipid Panel: No results for input(s): "CHOL", "TRIG", "HDL", "CHOLHDL", "VLDL", "LDLCALC" in the last 168 hours. HgbA1c: No results for input(s): "HGBA1C" in the last 168 hours. Urine Drug Screen:  Recent Labs  Lab 06/06/23 1750  LABOPIA NONE DETECTED  COCAINSCRNUR NONE DETECTED  LABBENZ NONE DETECTED  AMPHETMU NONE DETECTED  THCU NONE DETECTED  LABBARB NONE DETECTED    Alcohol Level  Recent Labs  Lab 06/06/23 0447  ETH <10     IMAGING past 24 hours EEG adult  Result Date: 06/06/2023 Sarah Quest, MD     06/06/2023  3:54 PM Patient Name: Sarah Freeman MRN: [id number] Epilepsy Attending: Charlsie Freeman Referring Physician/Provider: Caryl Pina, MD Date: 06/06/2023 Duration: 22.43 mins Patient history: 89yo F left sided weakness and R gaze deviation. EEG to evaluate for seizure Level of alertness: Awake AEDs during EEG study: GBP Technical aspects: This EEG study was done with scalp electrodes positioned according to the 10-20 International system of electrode placement. Electrical activity was reviewed with band pass filter of 1-70Hz , sensitivity of 7 uV/mm, display  speed of 60mm/sec with a 60Hz  notched filter applied as appropriate. EEG data were recorded continuously and digitally stored.  Video monitoring was available and reviewed as appropriate. Description: The posterior dominant rhythm consists of 7.5 Hz activity of moderate voltage (25-35 uV) seen predominantly in posterior head regions, symmetric and reactive to eye opening and eye closing. EEG showed intermittent generalized 3 to 6 Hz theta-delta slowing. Hyperventilation and photic stimulation were not performed.   ABNORMALITY - Intermittent slow, generalized IMPRESSION: This study is suggestive of mild diffuse encephalopathy, nonspecific etiology. No seizures or epileptiform discharges were seen throughout the recording. Sarah Freeman   DG Swallowing Func-Speech Pathology  Result Date: 06/06/2023 Table formatting from the original result was not included. Modified Barium Swallow Study Patient Details Name: Sarah Freeman MRN: [id number] Date of Birth: 09-24-1931 Today's Date: 06/06/2023 HPI/PMH: HPI: Sarah Freeman is a 89 y.o. female who presented to [facility] via EMS as a Code Stroke with left sided weakness and R gaze deviation, after getting up to go to the bathroom in the middle of the night and falling.  MRI 7/8: "Acute infarct in the deep right cerebral white matter affecting  external capsule and corona radiata. Small separate right frontal  and  parietal white matter infarcts."  Pt with medical history significant of dementia, prior CVA in 2017, hypertension, hyperlipidemia, bilateral carotid artery stenosis, hypothyroidism, stage III CKD, GERD, aortic atherosclerosis, and recent hospitalization in May 2024 and found to have a small acute infarct in the right corona radiata Clinical Impression: Clinical Impression: Pt presents with an overall functional oropharyngeal swallow. Generalized oral weakness and acute L sided asymmetry contribute to instances of anterior spillage and trace residue, which lines the  tongue and eventually clears with subsequent swallows. Pt had one instance of sensed aspiration during the initial teaspoonful of thin liquids that was independently ejected with a cough (PAS 6) in which the bolus spilled over the arytenoids during the swallow prior to complete closure of the laryngeal vestibule. No other instances of penetration or aspiration occurred with subsequent challenging sips of thin liquids, nectar thick liquids, honey thick liquids, or purees. When cued to take a bite of the graham cracker with purees, pt only took the puree; therefore solids were unable to be tested today. The pill was administered with thin liquids via straw and passed without difficulty. Pt may benefit from continued SLP f/u to address potential to upgrade diet and target functional compensatory strategies. Recommend initiating diet of Dys 1 textures with thin liquids and meds given whole with liquids as mentation allows (or crushed in puree PRN). Recommend full superivison during meal times. Factors that may increase risk of adverse event in presence of aspiration Sarah Freeman & Sarah Freeman 2021): Factors that may increase risk of adverse event in presence of aspiration Sarah Freeman & Sarah Freeman 2021): Poor general health and/or compromised immunity; Reduced cognitive function; Limited mobility; Frail or deconditioned Recommendations/Plan: Swallowing Evaluation Recommendations Swallowing Evaluation Recommendations Recommendations: PO diet PO Diet Recommendation: Dysphagia 1 (Pureed); Thin liquids (Level 0) Liquid Administration via: Straw; Cup Medication Administration: Whole meds with liquid Supervision: Full supervision/cueing for swallowing strategies Swallowing strategies  : Minimize environmental distractions; Slow rate; Small bites/sips; Check for anterior loss Postural changes: Position pt fully upright for meals Oral care recommendations: Oral care BID (2x/day) Treatment Plan Treatment Plan Treatment recommendations: Therapy  as outlined in treatment plan below Follow-up recommendations: Skilled nursing-short term rehab (<3 hours/day) Functional status assessment: Patient has had a recent decline in their functional status and demonstrates the ability to make significant improvements in function in a reasonable and predictable amount of time. Treatment frequency: Min 2x/week Treatment duration: 2 weeks Interventions: Aspiration precaution training; Compensatory techniques; Patient/family education; Trials of upgraded texture/liquids; Diet toleration management by SLP Recommendations Recommendations for follow up therapy are one component of a multi-disciplinary discharge planning process, led by the attending physician.  Recommendations may be updated based on patient status, additional functional criteria and insurance authorization. Assessment: Orofacial Exam: Orofacial Exam Oral Cavity: Oral Hygiene: WFL Oral Cavity - Dentition: Dentures, top; Dentures, bottom Orofacial Anatomy: Other (comment) (L sided assymetry) Oral Motor/Sensory Function: Generalized oral weakness Anatomy: Anatomy: Suspected cervical osteophytes Boluses Administered: Boluses Administered Boluses Administered: Thin liquids (Level 0); Mildly thick liquids (Level 2, nectar thick); Moderately thick liquids (Level 3, honey thick); Puree  Oral Impairment Domain: Oral Impairment Domain Lip Closure: Escape progressing to mid-chin Tongue control during bolus hold: Not tested Bolus preparation/mastication: -- (unable to test solids) Bolus transport/lingual motion: Brisk tongue motion Oral residue: Trace residue lining oral structures Location of oral residue : Tongue Initiation of pharyngeal swallow : Pyriform sinuses  Pharyngeal Impairment Domain: Pharyngeal Impairment Domain Soft palate elevation: No bolus between soft palate (SP)/pharyngeal wall (PW) Laryngeal elevation: Complete  superior movement of thyroid cartilage with complete approximation of arytenoids to  epiglottic petiole Anterior hyoid excursion: Partial anterior movement Epiglottic movement: Partial inversion Laryngeal vestibule closure: Incomplete, narrow column air/contrast in laryngeal vestibule Pharyngeal stripping wave : Present - diminished Pharyngeal contraction (A/P view only): N/A Pharyngoesophageal segment opening: Complete distension and complete duration, no obstruction of flow Tongue base retraction: Trace column of contrast or air between tongue base and PPW Pharyngeal residue: Complete pharyngeal Sarah Location of pharyngeal residue: N/A  Esophageal Impairment Domain: Esophageal Impairment Domain Esophageal Sarah upright position: Complete Sarah, esophageal coating Pill: Pill Consistency administered: Thin liquids (Level 0) Thin liquids (Level 0): [facility] Penetration/Aspiration Scale Score: Penetration/Aspiration Scale Score 6.  Material enters airway, passes BELOW cords then ejected out: Thin liquids (Level 0) Compensatory Strategies: Compensatory Strategies Compensatory strategies: Yes Straw: Effective   General Information: Caregiver present: Yes  Diet Prior to this Study: NPO   Temperature : Normal   Respiratory Status: WFL   Supplemental O2: None (Room air)   History of Recent Intubation: No  Behavior/Cognition: Alert; Cooperative; Requires cueing Self-Feeding Abilities: Needs assist with self-feeding Baseline vocal quality/speech: Normal Volitional Cough: Able to elicit Volitional Swallow: Able to elicit Exam Limitations: No limitations Goal Planning: Prognosis for improved oropharyngeal function: Good Barriers to Reach Goals: Cognitive deficits No data recorded Patient/Family Stated Goal: wants potatoes Consulted and agree with results and recommendations: Patient; Family member/caregiver Pain: Pain Assessment Pain Assessment: Faces Faces Pain Scale: 0 End of Session: Start Time:SLP Start Time (ACUTE ONLY): 1343 Stop Time: SLP Stop Time (ACUTE ONLY): 1401 Time Calculation:SLP Time  Calculation (min) (ACUTE ONLY): 18 min Charges: SLP Evaluations $ SLP Speech Visit: 1 Visit SLP Evaluations $BSS Swallow: 1 Procedure $MBS Swallow: 1 Procedure SLP visit diagnosis: SLP Visit Diagnosis: Dysphagia, unspecified (R13.10) Past Medical History: Past Medical History: Diagnosis Date  Hyperlipidemia   Hypothyroid   Stroke [facility])   Urinary incontinence  Past Surgical History: Past Surgical History: Procedure Laterality Date  BREAST BIOPSY Left 2000?  neg. dr. Rutherford Nail office  CHOLECYSTECTOMY  1964  COLONOSCOPY    Dr Maryruth Bun  DILATION AND CURETTAGE OF UTERUS    EYE SURGERY    LOOP RECORDER INSERTION N/A 02/09/2018  Procedure: LOOP RECORDER INSERTION;  Surgeon: Duke Salvia, MD;  Location: [facility] INVASIVE CV LAB;  Service: Cardiovascular;  Laterality: N/A;  SKIN CANCER EXCISION    face  TEE WITHOUT CARDIOVERSION N/A 02/09/2018  Procedure: TRANSESOPHAGEAL ECHOCARDIOGRAM (TEE);  Surgeon: Antonieta Iba, MD;  Location: [facility] ORS;  Service: Cardiovascular;  Laterality: N/A;  TOTAL ABDOMINAL HYSTERECTOMY W/ BILATERAL SALPINGOOPHORECTOMY    age 15 Gwynneth Aliment, Florestine Avers., CF-SLP Speech Language Pathology, Acute Rehabilitation Services Secure Chat preferred [phone] 06/06/2023, 3:05 PM   PHYSICAL EXAM  Temp:  [97.6 F (36.4 C)-98.3 F (36.8 C)] 98.3 F (36.8 C) (07/09 1213) Pulse Rate:  [47-89] 77 (07/09 1213) Resp:  [15-20] 20 (07/09 1213) BP: (120-160)/(64-87) 120/64 (07/09 1213) SpO2:  [75 %-100 %] 100 % (07/09 1213)  General - Well nourished, well developed, in no apparent distress. Cardiovascular - Regular rhythm and rate.  Mental Status -  Patient is drowsy has eyes closed, will open eyes to voice.  She is alert and oriented to self, age, place, and people in the room.  Pupils are equal and reactive, has a right gaze cannot cross midline, visual fields appear to be full to finger count.  She will follow commands, moderate dysarthria, no aphasia, slight paucity of speech.  She has a left  facial  droop, tongue deviates to the left.  Right upper extremity 5 out of 5, left upper extremity 2 out of 5, right lower extremity 4 out of 5 left lower extremity 3 out of 5.  Sensation appears to be intact no ataxia  ASSESSMENT/PLAN Sarah Freeman is a 89 y.o. female with history of HTN, Hypothyroidism, prior CVA and dementia presenting with  left side weakness and right gaze deviation. She had fallen when she got up to go to the bathroom and hollered out, caretaker founs her on the floor with above symptoms  Stroke:  Acute right CR and parietal periventricular WM ischemic infarcts  Etiology:  small vs. large vessel disease   CT head No acute abnormality. Small vessel disease. ASPECTS 10. Right MCA branch region calcification is chronic   CTA head & neck No LVO, Proximal R ICA stenosis measures 50% on coronal reformats.  CT perfusion  no infarct or ischemia  MRI  acute right external capsule and corona radiata 2D Echo  (5/9) EF 60-65%. LV with grade I diastolic dysfunction EEG mild diffuse encephalopathy LDL 78 HgbA1c 6.0 VTE prophylaxis -Lovenox aspirin 81 mg daily and clopidogrel 75 mg daily prior to admission, now on aspirin 81 mg and Brilinta 90 mg twice daily for 30 days then Plavix 75 mg daily for monotherapy. Family declined OCEANIC trial Therapy recommendations:  SNF  Disposition:  pending   Hx of stroke 01/2016 presented with left-sided numbness and weakness.  MRI showed many right cortical and subcortical small infarcts.  Carotid Doppler negative.  EF 70%, A1c 5.8, LDL 150.  Discharged on Plavix. 12/2017 admitted for inability to control of left upper extremity.  MRI showed a few small acute infarct in the right parietal deep white matter.  Concerning for embolic source.  Carotid Doppler negative.  EF 60 to 65%.  A1c 6.1, LDL 88.  TEE EF 55 to 60%.  Loop recorder placed For 2 years of loop recorder monitoring, no A-fib found. 03/2023 admitted for left leg weakness.  MRI showed small  acute infarct in the right CR.  MRA head and neck intracranial atherosclerosis involving bilateral PCAs.  Discharged on DAPT for 3 months and then aspirin alone, as well as Lipitor 80.  Hypertension Home meds:  none Stable Per family, patient baseline BP at home 90s to 110s Avoid low BP, avoid dehydration Long-term BP goal normotensive  Hyperlipidemia Home meds:  simvastatin 20, resumed in hospital LDL 78, goal < 70 No high intensity statin given advanced age and LDL not far from goal. Continue Zocor 20 at discharge  Other Stroke Risk Factors Advanced Age >/= 32  Obesity, Body mass index is 38.26 kg/m., BMI >/= 30 associated with increased stroke risk, recommend weight loss, diet and exercise as appropriate   Other Active Problems Hypothyroidism  Hospital day # 1  Gevena Mart DNP, ACNPC-AG  [facility]  ATTENDING NOTE: I reviewed above note and agree with the assessment and plan. Pt was seen and examined.   Daughters are at bedside.  Patient lying in bed, sleepy and drowsy, however neuro stable.  Discussed with family regarding oceanic trial, but family has declined it.  PT and OT recommend SNF.  Continue aspirin and Brilinta for 1 months and then Plavix alone.  Continue statin.  For detailed assessment and plan, please refer to above/below as I have made changes wherever appropriate.   Marvel Plan, MD PhD Stroke Neurology 06/07/2023 5:03 PM     To contact Stroke  Continuity provider, please refer to [email or web address]. After hours, contact General Neurology

## 2023-06-07 NOTE — [place] FL2 (Signed)
[place] MEDICAID FL2 LEVEL OF CARE FORM     IDENTIFICATION  Patient Name: Sarah Freeman Birthdate: [birth year] Sex: female Admission Date (Current Location): 06/06/2023  [facility] and [place] Number:  Chiropodist and Address:  [facility] [place], [address] [place], [place] [address]      Provider Number: [id number]  Attending Physician Name and Address:  Azucena Fallen, MD  Relative Name and Phone Number:       Current Level of Care: Hospital Recommended Level of Care: Skilled Nursing Facility Prior Approval Number:    Date Approved/Denied:   PASRR Number: [id number]  Discharge Plan: SNF    Current Diagnoses: Patient Active Problem List   Diagnosis Date Noted   Stroke-like symptoms 06/06/2023   Left hemiplegia (HCC) 05/19/2023   History of CVA with residual deficit 05/17/2023   Dyslipidemia 04/06/2023   Memory loss 12/29/2021   Aortic atherosclerosis (HCC) 02/27/2021   SVT (supraventricular tachycardia) 10/11/2018   HTN (hypertension) 10/11/2018   Chronic kidney disease, stage 3 (HCC) 05/24/2018   Morbid obesity (HCC) 02/02/2018   Carotid stenosis 02/01/2018   Left arm weakness 01/18/2018   Chronic UTI 11/04/2017   Statin intolerance 06/14/2017   Counseling regarding advanced directives and goals of care 02/22/2017   Weakness of left leg 02/25/2016   TIA (transient ischemic attack) 02/15/2016   Osteopenia 01/29/2016   Menopausal state 01/29/2016   Hypothyroidism 01/29/2016   GERD (gastroesophageal reflux disease) 01/29/2016   Hypercholesterolemia 01/29/2016   Lumbar spinal stenosis 01/29/2016   Stress incontinence 01/29/2016   Breast microcalcification, mammographic 06/17/2015    Orientation RESPIRATION BLADDER Height & Weight     Self  Normal Incontinent Weight: 170 lb 10.2 oz (77.4 kg) Height:  4\' 8"  (142.2 cm)  BEHAVIORAL SYMPTOMS/MOOD NEUROLOGICAL BOWEL NUTRITION STATUS      Incontinent Diet  AMBULATORY  STATUS COMMUNICATION OF NEEDS Skin   Extensive Assist Verbally Normal                       Personal Care Assistance Level of Assistance  Bathing, Feeding, Dressing Bathing Assistance: Maximum assistance Feeding assistance: Limited assistance Dressing Assistance: Maximum assistance     Functional Limitations Info  Speech     Speech Info: Impaired (dysarthria)    SPECIAL CARE FACTORS FREQUENCY  PT (By licensed PT), OT (By licensed OT)     PT Frequency: 5x/wk OT Frequency: 5x/wk            Contractures Contractures Info: Not present    Additional Factors Info  Code Status, Allergies, Psychotropic Code Status Info: DNR Allergies Info: Statins, Bacitracin, Neomycin-polymyxin-gramicidin Psychotropic Info: Aricept 5mg  daily; Seroquel 25mg  daily at bed         Current Medications (06/07/2023):  This is the current hospital active medication list Current Facility-Administered Medications  Medication Dose Route Frequency Provider Last Rate Last Admin   acetaminophen (TYLENOL) tablet 650 mg  650 mg Oral Q4H PRN Foust, Katy L, NP       Or   acetaminophen (TYLENOL) 160 MG/5ML solution 650 mg  650 mg Per Tube Q4H PRN Foust, Katy L, NP       Or   acetaminophen (TYLENOL) suppository 650 mg  650 mg Rectal Q4H PRN Foust, Katy L, NP       aspirin suppository 300 mg  300 mg Rectal Daily Gevena Mart A, NP   300 mg at 06/07/23 0918   donepezil (ARICEPT) tablet 5  mg  5 mg Oral Daily Foust, Katy L, NP   5 mg at 06/07/23 0918   enoxaparin (LOVENOX) injection 40 mg  40 mg Subcutaneous Q24H Foust, Katy L, NP   40 mg at 06/07/23 0918   gabapentin (NEURONTIN) capsule 200 mg  200 mg Oral Daily Foust, Katy L, NP   200 mg at 06/07/23 1610   levothyroxine (SYNTHROID) tablet 50 mcg  50 mcg Oral QAC breakfast Foust, Katy L, NP   50 mcg at 06/07/23 0918   melatonin tablet 3 mg  3 mg Oral QHS PRN Foust, Katy L, NP   3 mg at 06/06/23 2123   nitrofurantoin (macrocrystal-monohydrate) (MACROBID)  capsule 100 mg  100 mg Oral QHS Foust, Katy L, NP   100 mg at 06/06/23 2131   ondansetron (ZOFRAN) injection 4 mg  4 mg Intravenous Q8H PRN Foust, Katy L, NP       pantoprazole (PROTONIX) injection 40 mg  40 mg Intravenous Q24H Foust, Katy L, NP   40 mg at 06/07/23 0918   polyethylene glycol (MIRALAX / GLYCOLAX) packet 17 g  17 g Oral Daily Foust, Katy L, NP       QUEtiapine (SEROQUEL) tablet 25 mg  25 mg Oral QHS Foust, Katy L, NP   25 mg at 06/06/23 2123   simvastatin (ZOCOR) tablet 20 mg  20 mg Oral Daily Marvel Plan, MD   20 mg at 06/07/23 9604   ticagrelor (BRILINTA) tablet 90 mg  90 mg Oral BID Gevena Mart A, NP   90 mg at 06/07/23 5409     Discharge Medications: Please see discharge summary for a list of discharge medications.  Relevant Imaging Results:  Relevant Lab Results:   Additional Information SS#: [id number]  Baldemar Lenis, LCSW

## 2023-06-07 NOTE — Evaluation (Signed)
Occupational Therapy Evaluation Patient Details Name: Sarah Freeman MRN: [id number] DOB: Aug 03, 1931 Today's Date: 06/07/2023   History of Present Illness Pt is a 89 y/o female presenting after a fall at home and L sided weakness. MRI brain showed acute infarct in deep R cerebral white matter impacting external capsule and corona radiata, as well as small R frontal and parietal white matter infarcts. PMH: dementia, prior CVA in 2017 and May 2024, hypertension, hyperlipidemia, bilateral carotid artery stenosis, hypothyroidism, stage III CKD, GERD, aortic atherosclerosis   Clinical Impression   PTA, pt lives alone with recent 24/7 supervision/assist since DC from SNF rehab. Pt typically ambulatory with RW, able to manage ADLs w/ light assist and perform basic IADLs. Pt presents now with deficits in cognition (A&Ox1 and very impulsive), L sided strength, L inattention w/ questionable visual deficits and balance. Overall, pt requires Max A x 2 for bed mobility and BSC transfers with minimal activation on L side to assist. Pt's son entering at end of session, interested in low intensity rehab at DC with preference of [facility] if possible. Will continue to follow acutely.      Recommendations for follow up therapy are one component of a multi-disciplinary discharge planning process, led by the attending physician.  Recommendations may be updated based on patient status, additional functional criteria and insurance authorization.   Assistance Recommended at Discharge Frequent or constant Supervision/Assistance  Patient can return home with the following Two people to help with walking and/or transfers;Two people to help with bathing/dressing/bathroom    Functional Status Assessment  Patient has had a recent decline in their functional status and demonstrates the ability to make significant improvements in function in a reasonable and predictable amount of time.  Equipment Recommendations  BSC/3in1     Recommendations for Other Services       Precautions / Restrictions Precautions Precautions: Fall;Other (comment) Precaution Comments: impulsive, R mitt, urine incontinence Restrictions Weight Bearing Restrictions: No      Mobility Bed Mobility Overal bed mobility: Needs Assistance Bed Mobility: Supine to Sit, Sit to Supine     Supine to sit: Max assist, +2 for physical assistance, +2 for safety/equipment, HOB elevated Sit to supine: Max assist, +2 for physical assistance, +2 for safety/equipment   General bed mobility comments: initially attempted to exit L side though pt initiating to R side only. Pt able to bring R LE to EOB and pull on bedrail with R hand. Assist for LLE and truncal assist to sit EOB. Assist for trunk and LE back to bed    Transfers Overall transfer level: Needs assistance Equipment used: 2 person hand held assist Transfers: Sit to/from Stand, Bed to chair/wheelchair/BSC Sit to Stand: Max assist, +2 physical assistance, +2 safety/equipment Stand pivot transfers: Max assist, +2 physical assistance, +2 safety/equipment         General transfer comment: Minimal initiation or activation of L side to/from [facility]. assist needed to swing hips from surface to surface and maintain balance      Balance Overall balance assessment: Needs assistance Sitting-balance support: Feet supported, No upper extremity supported Sitting balance-Leahy Scale: Fair Sitting balance - Comments: initially Min A regaining to close min guard   Standing balance support: Bilateral upper extremity supported, During functional activity Standing balance-Leahy Scale: Poor                             ADL either performed or assessed with clinical judgement  ADL Overall ADL's : Needs assistance/impaired Eating/Feeding: Minimal assistance;Sitting Eating/Feeding Details (indicate cue type and reason): able to reach for water cup on tray table slightly to the left and take  a sip. Anticipate assist needed with utensils, etc Grooming: Minimal assistance;Bed level;Sitting;Wash/dry face Grooming Details (indicate cue type and reason): able to wash face bed level with R hand, anticipate some assist needed for bimanual tasks. pt washed face multiple times while on BSC Upper Body Bathing: Maximal assistance;Sitting   Lower Body Bathing: Total assistance;+2 for physical assistance;+2 for safety/equipment;Sit to/from stand   Upper Body Dressing : Maximal assistance;Sitting   Lower Body Dressing: Total assistance;+2 for physical assistance;+2 for safety/equipment;Sit to/from stand   Toilet Transfer: Maximal assistance;+2 for physical assistance;+2 for safety/equipment;Stand-pivot;BSC/3in1 Toilet Transfer Details (indicate cue type and reason): +2 assist as pt reporting need to urinate and impulsively attempting to stand at bedside. in standing, noted with L sided weakness and minimal activation. Cued for [facility] transfer rather than bathroom attempt. Once on [facility], pt reports need to urinate (though does not on BSC due to not being on BSC, etc). Toileting- Clothing Manipulation and Hygiene: Total assistance;+2 for physical assistance;+2 for safety/equipment;Sit to/from stand               Vision Baseline Vision/History: 1 Wears glasses Ability to See in Adequate Light: 2 Moderately impaired Patient Visual Report: Other (comment) (question L inattention) Vision Assessment?: Vision impaired- to be further tested in functional context Additional Comments: to be further assessed; L inattention but was able to cross midline slightly to stimuli on L side. difficult to fully assess due to impaired cognition     Perception     Praxis      Pertinent Vitals/Pain Pain Assessment Pain Assessment: No/denies pain     Hand Dominance Right   Extremity/Trunk Assessment Upper Extremity Assessment Upper Extremity Assessment: LUE deficits/detail LUE Deficits / Details: some  increased tone with stretching of digits and elbow but easily stretched passively. some light/weak grasp but noted inattention LUE Sensation: decreased light touch LUE Coordination: decreased fine motor;decreased gross motor   Lower Extremity Assessment Lower Extremity Assessment: Defer to PT evaluation   Cervical / Trunk Assessment Cervical / Trunk Assessment: Kyphotic   Communication Communication Communication: HOH   Cognition Arousal/Alertness: Awake/alert Behavior During Therapy: Restless, Impulsive, Flat affect Overall Cognitive Status: Impaired/Different from baseline Area of Impairment: Orientation, Attention, Following commands, Memory, Safety/judgement, Awareness, Problem solving                 Orientation Level: Disoriented to, Place, Time, Situation Current Attention Level: Focused Memory: Decreased short-term memory Following Commands: Follows one step commands inconsistently, Follows one step commands with increased time Safety/Judgement: Decreased awareness of safety, Decreased awareness of deficits Awareness: Intellectual Problem Solving: Difficulty sequencing, Requires verbal cues, Requires tactile cues General Comments: A&Ox1, reports being at her house, demanding therapists fold the linens in the chair and go in the other room to get her depends. Inconsistent command following due to confusion and impulsivity, attempting to stand without assist. Requested to use bathroom - cued for [facility] due to L sided weakness though pt would not urinate while on BSC ; wanting mesh underwear off then on, etc.     General Comments  Son, Link Snuffer entering at end of session - gave detailed background info on recent hospitalization, pt dementia and changing behaviors and request for [facility] if possible    Exercises     Shoulder Instructions  Home Living Family/patient expects to be discharged to:: Private residence Living Arrangements: Alone Available Help at  Discharge: Family;Available 24 hours/day;Personal care attendant Type of Home: House Home Access: Stairs to enter [employer] of Steps: 3 Entrance Stairs-Rails: Left Home Layout: One level     Bathroom Shower/Tub: Sponge bathes at baseline   Bathroom Toilet: Standard Bathroom Accessibility: Yes   Home Equipment: Agricultural consultant (2 wheels);Grab bars - tub/shower   Additional Comments: some info from prior admission and some from limited convo w/ family on eval. After DC from [facility], family arranged 24/7 care with family and hired caregivers      Prior Functioning/Environment Prior Level of Function : Independent/Modified Independent             Mobility Comments: using RW for mobility ADLs Comments: Since DC from SNF to home, pt has been receiving some assist for LB bathing/dressing as needed, able to make sandwich and coffee at home. walking to bathroom, wears depends        OT Problem List: Decreased strength;Impaired balance (sitting and/or standing);Decreased range of motion;Decreased activity tolerance;Impaired vision/perception;Decreased cognition;Decreased coordination;Decreased safety awareness;Decreased knowledge of use of DME or AE;Impaired UE functional use      OT Treatment/Interventions: Self-care/ADL training;Therapeutic exercise;Energy conservation;DME and/or AE instruction;Therapeutic activities;Visual/perceptual remediation/compensation;Patient/family education    OT Goals(Current goals can be found in the care plan section) Acute Rehab OT Goals Patient Stated Goal: for pt to go to [facility] rehab OT Goal Formulation: With patient/family Time For Goal Achievement: 06/21/23 Potential to Achieve Goals: Good  OT Frequency: Min 2X/week    Co-evaluation PT/OT/SLP Co-Evaluation/Treatment: Yes Reason for Co-Treatment: Necessary to address cognition/behavior during functional activity;For patient/therapist safety;To address functional/ADL  transfers   OT goals addressed during session: ADL's and self-care;Proper use of Adaptive equipment and DME      AM-PAC OT "6 Clicks" Daily Activity     Outcome Measure Help from another person eating meals?: A Little Help from another person taking care of personal grooming?: A Little Help from another person toileting, which includes using toliet, bedpan, or urinal?: Total Help from another person bathing (including washing, rinsing, drying)?: A Lot Help from another person to put on and taking off regular upper body clothing?: A Lot Help from another person to put on and taking off regular lower body clothing?: Total 6 Click Score: 12   End of Session Equipment Utilized During Treatment: Gait belt Nurse Communication: Mobility status  Activity Tolerance: Patient tolerated treatment well Patient left: in bed;with call bell/phone within reach;with bed alarm set;with family/visitor present  OT Visit Diagnosis: Unsteadiness on feet (R26.81);Other abnormalities of gait and mobility (R26.89);Muscle weakness (generalized) (M62.81)                Time: 1610-9604 OT Time Calculation (min): 44 min Charges:  OT General Charges $OT Visit: 1 Visit OT Evaluation $OT Eval Moderate Complexity: 1 Mod OT Treatments $Self Care/Home Management : 8-22 mins  Bradd Canary, OTR/L Acute Rehab Services Office: [phone]   Lorre Munroe 06/07/2023, 9:09 AM

## 2023-06-07 NOTE — Progress Notes (Addendum)
Speech Language Pathology Treatment: Dysphagia  Patient Details Name: Sarah Freeman MRN: [id number] DOB: 25-May-1931 Today's Date: 06/07/2023 Time: 1050-1102 SLP Time Calculation (min) (ACUTE ONLY): 12 min  Assessment / Plan / Recommendation Clinical Impression  Pt's daughter and son present in pt room upon SLP arrival. They report pt typically eats and drinks all textures with no difficulty. They state they are always present at mealtimes and pt has had a good appetite and no overt difficulty with her meal trays. Today, pt appears to be more drowsy and required increased cueing to attend to PO presentations. Pt was observed with trials of thin liquids and Dys 3 type textures (graham crackers dipped in applesauce). No overt s/s of aspiration were noted throughout PO trials; however, pt had increased oral residue noted with solids. She required increased cueing to initiate a swallow, and had significantly more instances of oral holding observed specifically with solids. Education was provided to family members regarding giving verbal cues during meal times and providing a liquid wash when needed. Recommend continuing diet of Dys 1 textures with thin liquids due to her fluctuating mentation. Pt requires full supervision during mealtimes. SLP will continue to f/u to assess pt's ability to upgrade her diet to more solid consistencies.     HPI HPI: Sarah Freeman is a 89 y.o. female who presented to [facility] via EMS as a Code Stroke with left sided weakness and R gaze deviation, after getting up to go to the bathroom in the middle of the night and falling.  MRI 7/8: "Acute infarct in the deep right cerebral white matter affecting  external capsule and corona radiata. Small separate right frontal  and parietal white matter infarcts."  Pt with medical history significant of dementia, prior CVA in 2017, hypertension, hyperlipidemia, bilateral carotid artery stenosis, hypothyroidism, stage III CKD, GERD, aortic  atherosclerosis, and recent hospitalization in May 2024 and found to have a small acute infarct in the right corona radiata      SLP Plan  Continue with current plan of care      Recommendations for follow up therapy are one component of a multi-disciplinary discharge planning process, led by the attending physician.  Recommendations may be updated based on patient status, additional functional criteria and insurance authorization.    Recommendations  Diet recommendations: Dysphagia 1 (puree);Thin liquid Liquids provided via: Straw;Cup Medication Administration: Whole meds with liquid (crushed in puree PRN) Supervision: Full supervision/cueing for compensatory strategies Compensations: Minimize environmental distractions;Small sips/bites;Slow rate Postural Changes and/or Swallow Maneuvers: Seated upright 90 degrees;Upright 30-60 min after meal                  Oral care BID   Frequent or constant Supervision/Assistance Dysphagia, unspecified (R13.10)     Continue with current plan of care     Gwynneth Aliment, M.A., CF-SLP Speech Language Pathology, Acute Rehabilitation Services  Secure Chat preferred [phone]   06/07/2023, 11:08 AM

## 2023-06-07 NOTE — Progress Notes (Addendum)
PROGRESS NOTE    Sarah Freeman  ONG:[id number] DOB: 17-Nov-1931 DOA: 06/06/2023 PCP: Dorcas Carrow, DO   Brief Narrative:  Sarah Freeman is a 89 y.o. female with medical history significant of dementia, prior CVA in 2017, hypertension, hyperlipidemia, bilateral carotid artery stenosis, hypothyroidism, stage III CKD, GERD, aortic atherosclerosis, and recent hospitalization in May 2024 and found to have a small acute infarct in the right corona radiata.  Patient presents to [facility] after fall, unwitnessed, at night at home where she was witnessed to not be moving her left side.  Presents to the hospital for code stroke evaluation.  Hospitalist called for admission, neurology called in consult.   Assessment & Plan:   Principal Problem:   Stroke-like symptoms Active Problems:   Hypothyroidism   Statin intolerance   Chronic UTI   Chronic kidney disease, stage 3 (HCC)   Dyslipidemia   Acute CVA, POA Seizure unlikely -MRI confirms acute infarct, neurology following -Given recent smaller stroke with recurrence now in the same area but larger while on Plavix and aspirin neurology recommending further evaluation for study for factor XI inhibitor ([place] trial) -Ultimate disposition likely pending physical therapy evaluation currently recommending SNF. -Recent neurowork-up in May for similar episode, no indication to repeat echo at this time -Transition from simvastatin to Crestor low-dose given questionable history of statin intolerance.  Advance as tolerate to high-dose statin if possible.   Hypertension -Currently undergoing permissive hypertension, resume home medications on next 24 to 48 hours  Chronic Kidney Disease, Stage 3a -IV fluid ongoing while n.p.o. -Reported CKD 3A however creatinine/BUN appear to be within normal limits -will verify this diagnosis with PCP at discharge   Chronic UTI - Continue Home Macrobid   Memory Loss -Questionably vascular dementia in the  setting of strokes and small vessel disease.  On Aricept. -Not admitted per daughter at bedside, while previously able to perform all ADLs she is now limited to simple tasks including feeding herself, ambulate independently but denies patient has been able to close or bathe herself independently for some time    #Hypothyroidism - Continue home levothyroxine   DVT prophylaxis: enoxaparin (LOVENOX) injection 40 mg Start: 06/06/23 0930 Code Status:   Code Status: DNR Family Communication: Daughter at bedside  Status is: Inpatient  Dispo: The patient is from: Home              Anticipated d/c is to: SNF              Anticipated d/c date is: 24 to 48 hours              Patient currently not medically stable for discharge while being evaluated by PT neurology and possible enrollment in trial study for factor XI inhibitor  Consultants:  Neurology  Procedures:  None  Antimicrobials:  None  Subjective: No acute issues or events overnight, patient somnolent but easily arousable review of systems limited due to mental status.  Daughter indicates patient is resting comfortably  Objective: Vitals:   06/06/23 1832 06/06/23 1948 06/06/23 2329 06/07/23 0410  BP: 125/79 130/74 (!) 160/83 (!) 148/87  Pulse: 61 89 76 (!) 47  Resp: 16 15    Temp: 97.9 F (36.6 C) 98.2 F (36.8 C) 97.8 F (36.6 C) 97.6 F (36.4 C)  TempSrc: Oral Oral Oral Oral  SpO2: 100% (!) 82% (!) 81% (!) 75%  Weight:      Height:        Intake/Output Summary (Last 24  hours) at 06/07/2023 0731 Last data filed at 06/07/2023 0544 Gross per 24 hour  Intake 250 ml  Output 600 ml  Net -350 ml   Filed Weights   Jun 22, 2023 0400  Weight: 77.4 kg    Examination:  General: Somnolent, easily arousable, alert to person HEENT:  Normocephalic atraumatic.  Sclerae nonicteric, noninjected.  Extraocular movements intact bilaterally. Neck:  Without mass or deformity.  Trachea is midline. Lungs:  Clear to auscultate bilaterally  without rhonchi, wheeze, or rales. Heart:  Regular rate and rhythm.  Without murmurs, rubs, or gallops. Abdomen:  Soft, nontender, nondistended.  Without guarding or rebound. Extremities: Left upper and lower extremity deficits/weakness  Data Reviewed: I have personally reviewed following labs and imaging studies  CBC: Recent Labs  Lab Jun 22, 2023 0447 06/22/2023 0450  WBC 7.7  --   NEUTROABS 3.3  --   HGB 12.0 12.6  HCT 36.8 37.0  MCV 95.6  --   PLT 268  --    Basic Metabolic Panel: Recent Labs  Lab 2023/06/22 0447 06/22/2023 0450  NA 137 138  K 3.8 3.9  CL 102 103  CO2 25  --   GLUCOSE 101* 98  BUN 9 9  CREATININE 0.97 1.00  CALCIUM 8.8*  --    GFR: Estimated Creatinine Clearance: 31.1 mL/min (by C-G formula based on SCr of 1 mg/dL). Liver Function Tests: Recent Labs  Lab 2023/06/22 0447  AST 43*  ALT 38  ALKPHOS 51  BILITOT 0.5  PROT 6.4*  ALBUMIN 2.9*   Coagulation Profile: Recent Labs  Lab June 22, 2023 0447  INR 1.0    CBG: Recent Labs  Lab 06-22-23 0445 2023-06-22 2106 06-22-2023 2334 06/07/23 0414  GLUCAP 87 98 105* 74    No results found for this or any previous visit (from the past 240 hour(s)).   Radiology Studies: EEG adult  Result Date: June 22, 2023 Charlsie Quest, MD     06/22/23  3:54 PM Patient Name: Sarah Freeman MRN: [id number] Epilepsy Attending: Charlsie Quest Referring Physician/Provider: Caryl Pina, MD Date: 22-Jun-2023 Duration: 22.43 mins Patient history: 89yo F left sided weakness and R gaze deviation. EEG to evaluate for seizure Level of alertness: Awake AEDs during EEG study: GBP Technical aspects: This EEG study was done with scalp electrodes positioned according to the 10-20 International system of electrode placement. Electrical activity was reviewed with band pass filter of 1-70Hz , sensitivity of 7 uV/mm, display speed of 61mm/sec with a 60Hz  notched filter applied as appropriate. EEG data were recorded continuously and digitally  stored.  Video monitoring was available and reviewed as appropriate. Description: The posterior dominant rhythm consists of 7.5 Hz activity of moderate voltage (25-35 uV) seen predominantly in posterior head regions, symmetric and reactive to eye opening and eye closing. EEG showed intermittent generalized 3 to 6 Hz theta-delta slowing. Hyperventilation and photic stimulation were not performed.   ABNORMALITY - Intermittent slow, generalized IMPRESSION: This study is suggestive of mild diffuse encephalopathy, nonspecific etiology. No seizures or epileptiform discharges were seen throughout the recording. Charlsie Quest   DG Swallowing Func-Speech Pathology  Result Date: June 22, 2023 Table formatting from the original result was not included. Modified Barium Swallow Study Patient Details Name: Sarah Freeman MRN: [id number] Date of Birth: Apr 09, 1931 Today's Date: June 22, 2023 HPI/PMH: HPI: Sarah Freeman is a 89 y.o. female who presented to [facility] via EMS as a Code Stroke with left sided weakness and R gaze deviation, after getting up to go to the bathroom in the  middle of the night and falling.  MRI 7/8: "Acute infarct in the deep right cerebral white matter affecting  external capsule and corona radiata. Small separate right frontal  and parietal white matter infarcts."  Pt with medical history significant of dementia, prior CVA in 2017, hypertension, hyperlipidemia, bilateral carotid artery stenosis, hypothyroidism, stage III CKD, GERD, aortic atherosclerosis, and recent hospitalization in May 2024 and found to have a small acute infarct in the right corona radiata Clinical Impression: Clinical Impression: Pt presents with an overall functional oropharyngeal swallow. Generalized oral weakness and acute L sided asymmetry contribute to instances of anterior spillage and trace residue, which lines the tongue and eventually clears with subsequent swallows. Pt had one instance of sensed aspiration during the initial  teaspoonful of thin liquids that was independently ejected with a cough (PAS 6) in which the bolus spilled over the arytenoids during the swallow prior to complete closure of the laryngeal vestibule. No other instances of penetration or aspiration occurred with subsequent challenging sips of thin liquids, nectar thick liquids, honey thick liquids, or purees. When cued to take a bite of the graham cracker with purees, pt only took the puree; therefore solids were unable to be tested today. The pill was administered with thin liquids via straw and passed without difficulty. Pt may benefit from continued SLP f/u to address potential to upgrade diet and target functional compensatory strategies. Recommend initiating diet of Dys 1 textures with thin liquids and meds given whole with liquids as mentation allows (or crushed in puree PRN). Recommend full superivison during meal times. Factors that may increase risk of adverse event in presence of aspiration Sarah Freeman & Clearance Coots 2021): Factors that may increase risk of adverse event in presence of aspiration Sarah Freeman & Clearance Coots 2021): Poor general health and/or compromised immunity; Reduced cognitive function; Limited mobility; Frail or deconditioned Recommendations/Plan: Swallowing Evaluation Recommendations Swallowing Evaluation Recommendations Recommendations: PO diet PO Diet Recommendation: Dysphagia 1 (Pureed); Thin liquids (Level 0) Liquid Administration via: Straw; Cup Medication Administration: Whole meds with liquid Supervision: Full supervision/cueing for swallowing strategies Swallowing strategies  : Minimize environmental distractions; Slow rate; Small bites/sips; Check for anterior loss Postural changes: Position pt fully upright for meals Oral care recommendations: Oral care BID (2x/day) Treatment Plan Treatment Plan Treatment recommendations: Therapy as outlined in treatment plan below Follow-up recommendations: Skilled nursing-short term rehab (<3 hours/day)  Functional status assessment: Patient has had a recent decline in their functional status and demonstrates the ability to make significant improvements in function in a reasonable and predictable amount of time. Treatment frequency: Min 2x/week Treatment duration: 2 weeks Interventions: Aspiration precaution training; Compensatory techniques; Patient/family education; Trials of upgraded texture/liquids; Diet toleration management by SLP Recommendations Recommendations for follow up therapy are one component of a multi-disciplinary discharge planning process, led by the attending physician.  Recommendations may be updated based on patient status, additional functional criteria and insurance authorization. Assessment: Orofacial Exam: Orofacial Exam Oral Cavity: Oral Hygiene: WFL Oral Cavity - Dentition: Dentures, top; Dentures, bottom Orofacial Anatomy: Other (comment) (L sided assymetry) Oral Motor/Sensory Function: Generalized oral weakness Anatomy: Anatomy: Suspected cervical osteophytes Boluses Administered: Boluses Administered Boluses Administered: Thin liquids (Level 0); Mildly thick liquids (Level 2, nectar thick); Moderately thick liquids (Level 3, honey thick); Puree  Oral Impairment Domain: Oral Impairment Domain Lip Closure: Escape progressing to mid-chin Tongue control during bolus hold: Not tested Bolus preparation/mastication: -- (unable to test solids) Bolus transport/lingual motion: Brisk tongue motion Oral residue: Trace residue lining oral structures Location of oral  residue : Tongue Initiation of pharyngeal swallow : Pyriform sinuses  Pharyngeal Impairment Domain: Pharyngeal Impairment Domain Soft palate elevation: No bolus between soft palate (SP)/pharyngeal wall (PW) Laryngeal elevation: Complete superior movement of thyroid cartilage with complete approximation of arytenoids to epiglottic petiole Anterior hyoid excursion: Partial anterior movement Epiglottic movement: Partial inversion Laryngeal  vestibule closure: Incomplete, narrow column air/contrast in laryngeal vestibule Pharyngeal stripping wave : Present - diminished Pharyngeal contraction (A/P view only): N/A Pharyngoesophageal segment opening: Complete distension and complete duration, no obstruction of flow Tongue base retraction: Trace column of contrast or air between tongue base and PPW Pharyngeal residue: Complete pharyngeal clearance Location of pharyngeal residue: N/A  Esophageal Impairment Domain: Esophageal Impairment Domain Esophageal clearance upright position: Complete clearance, esophageal coating Pill: Pill Consistency administered: Thin liquids (Level 0) Thin liquids (Level 0): [facility] Penetration/Aspiration Scale Score: Penetration/Aspiration Scale Score 6.  Material enters airway, passes BELOW cords then ejected out: Thin liquids (Level 0) Compensatory Strategies: Compensatory Strategies Compensatory strategies: Yes Straw: Effective   General Information: Caregiver present: Yes  Diet Prior to this Study: NPO   Temperature : Normal   Respiratory Status: WFL   Supplemental O2: None (Room air)   History of Recent Intubation: No  Behavior/Cognition: Alert; Cooperative; Requires cueing Self-Feeding Abilities: Needs assist with self-feeding Baseline vocal quality/speech: Normal Volitional Cough: Able to elicit Volitional Swallow: Able to elicit Exam Limitations: No limitations Goal Planning: Prognosis for improved oropharyngeal function: Good Barriers to Reach Goals: Cognitive deficits No data recorded Patient/Family Stated Goal: wants potatoes Consulted and agree with results and recommendations: Patient; Family member/caregiver Pain: Pain Assessment Pain Assessment: Faces Faces Pain Scale: 0 End of Session: Start Time:SLP Start Time (ACUTE ONLY): 1343 Stop Time: SLP Stop Time (ACUTE ONLY): 1401 Time Calculation:SLP Time Calculation (min) (ACUTE ONLY): 18 min Charges: SLP Evaluations $ SLP Speech Visit: 1 Visit SLP Evaluations $BSS Swallow:  1 Procedure $MBS Swallow: 1 Procedure SLP visit diagnosis: SLP Visit Diagnosis: Dysphagia, unspecified (R13.10) Past Medical History: Past Medical History: Diagnosis Date  Hyperlipidemia   Hypothyroid   Stroke [facility])   Urinary incontinence  Past Surgical History: Past Surgical History: Procedure Laterality Date  BREAST BIOPSY Left 2000?  neg. dr. Rutherford Nail office  CHOLECYSTECTOMY  1964  COLONOSCOPY    Dr Maryruth Bun  DILATION AND CURETTAGE OF UTERUS    EYE SURGERY    LOOP RECORDER INSERTION N/A 02/09/2018  Procedure: LOOP RECORDER INSERTION;  Surgeon: Duke Salvia, MD;  Location: [facility] INVASIVE CV LAB;  Service: Cardiovascular;  Laterality: N/A;  SKIN CANCER EXCISION    face  TEE WITHOUT CARDIOVERSION N/A 02/09/2018  Procedure: TRANSESOPHAGEAL ECHOCARDIOGRAM (TEE);  Surgeon: Antonieta Iba, MD;  Location: [facility] ORS;  Service: Cardiovascular;  Laterality: N/A;  TOTAL ABDOMINAL HYSTERECTOMY W/ BILATERAL SALPINGOOPHORECTOMY    age 40 Gwynneth Aliment, M.A., CF-SLP Speech Language Pathology, Acute Rehabilitation Services Secure Chat preferred [phone] 06/06/2023, 3:05 PM  MR BRAIN WO CONTRAST  Result Date: 06/06/2023 CLINICAL DATA:  Neuro deficit with acute stroke suspected EXAM: MRI HEAD WITHOUT CONTRAST TECHNIQUE: Multiplanar, multiecho pulse sequences of the brain and surrounding structures were obtained without intravenous contrast. COMPARISON:  Head CT and CTA from earlier today. Brain MRI 04/06/2023 FINDINGS: Brain: Confluent restricted diffusion in the right corona radiata and external capsule. Separate area of small restricted diffusion in the right superior frontal and parietal white matter. Background of extensive chronic small vessel ischemia. Small chronic right cerebral cortex infarcts. No hemorrhage, hydrocephalus, or collection. Vascular: Major flow voids are preserved.  There was preceding CTA. Skull and upper cervical spine: Normal marrow signal Sinuses/Orbits: Bilateral cataract resection. Partial right  mastoid opacification. IMPRESSION: 1. Acute infarct in the deep right cerebral white matter affecting external capsule and corona radiata. Small separate right frontal and parietal white matter infarcts. 2. Extensive chronic small vessel disease and small chronic right frontal cortex infarcts. Electronically Signed   By: Tiburcio Pea M.D.   On: 06/06/2023 08:03   CT ANGIO HEAD NECK W WO CM (CODE STROKE)  Result Date: 06/06/2023 CLINICAL DATA:  Code stroke.  Left-sided weakness EXAM: CT ANGIOGRAPHY HEAD AND NECK CT PERFUSION BRAIN TECHNIQUE: Multidetector CT imaging of the head and neck was performed using the standard protocol during bolus administration of intravenous contrast. Multiplanar CT image reconstructions and MIPs were obtained to evaluate the vascular anatomy. Carotid stenosis measurements (when applicable) are obtained utilizing NASCET criteria, using the distal internal carotid diameter as the denominator. Multiphase CT imaging of the brain was performed following IV bolus contrast injection. Subsequent parametric perfusion maps were calculated using RAPID software. RADIATION DOSE REDUCTION: This exam was performed according to the departmental dose-optimization program which includes automated exposure control, adjustment of the mA and/or kV according to patient size and/or use of iterative reconstruction technique. CONTRAST:  OMNIPAQUE IOHEXOL 350 MG/ML SOLN COMPARISON:  MRA of the head and neck 04/06/2023 FINDINGS: CTA NECK FINDINGS Aortic arch: Atheromatous plaque with 3 vessel branching. No acute finding or dilatation Right carotid system: Mixed density plaque primarily at the bifurcation. Proximal ICA stenosis measures 50% on coronal reformats. No ulceration or dissection. Left carotid system: Calcified plaque at the bifurcation without stenosis or ulceration. Vertebral arteries: Proximal subclavian atherosclerosis without flow reducing stenosis. The left vertebral artery is dominant  the smoothly contoured, and widely patent. Skeleton: Generalized cervical spine degeneration with multilevel anterolisthesis. Other neck: No acute finding Upper chest: No acute finding Review of the MIP images confirms the above findings CTA HEAD FINDINGS Anterior circulation: Atheromatous calcification of the carotid siphons. No branch occlusion, beading, or aneurysm. No proximal flow limiting stenosis. Posterior circulation: Left dominant vertebral artery. The vertebral and basilar arteries are widely patent. High-grade narrowing at the left PCA bifurcation although no major branch occlusion. No beading or aneurysm Venous sinuses: Unremarkable for the arterial phase Anatomic variants: None significant Review of the MIP images confirms the above findings CT Brain Perfusion Findings: ASPECTS: 10 CBF (<30%) Volume: 0mL Perfusion (Tmax>6.0s) volume: 0mL With very strict time parameters there is relatively delayed perfusion in the right MCA upper division and watershed territories. IMPRESSION: 1. No emergent large vessel occlusion. 2. No infarct or ischemia by standard threshold CT perfusion. With very strict time parameters there is relatively delayed perfusion to the upper division right MCA territory and watershed which may be related to a 50-55% right ICA origin stenosis. 3. High-grade left PCA branch narrowing. Electronically Signed   By: Tiburcio Pea M.D.   On: 06/06/2023 05:27   CT CEREBRAL PERFUSION W CONTRAST  Result Date: 06/06/2023 CLINICAL DATA:  Code stroke.  Left-sided weakness EXAM: CT ANGIOGRAPHY HEAD AND NECK CT PERFUSION BRAIN TECHNIQUE: Multidetector CT imaging of the head and neck was performed using the standard protocol during bolus administration of intravenous contrast. Multiplanar CT image reconstructions and MIPs were obtained to evaluate the vascular anatomy. Carotid stenosis measurements (when applicable) are obtained utilizing NASCET criteria, using the distal internal carotid  diameter as the denominator. Multiphase CT imaging of the brain was performed following IV bolus contrast injection. Subsequent  parametric perfusion maps were calculated using RAPID software. RADIATION DOSE REDUCTION: This exam was performed according to the departmental dose-optimization program which includes automated exposure control, adjustment of the mA and/or kV according to patient size and/or use of iterative reconstruction technique. CONTRAST:  OMNIPAQUE IOHEXOL 350 MG/ML SOLN COMPARISON:  MRA of the head and neck 04/06/2023 FINDINGS: CTA NECK FINDINGS Aortic arch: Atheromatous plaque with 3 vessel branching. No acute finding or dilatation Right carotid system: Mixed density plaque primarily at the bifurcation. Proximal ICA stenosis measures 50% on coronal reformats. No ulceration or dissection. Left carotid system: Calcified plaque at the bifurcation without stenosis or ulceration. Vertebral arteries: Proximal subclavian atherosclerosis without flow reducing stenosis. The left vertebral artery is dominant the smoothly contoured, and widely patent. Skeleton: Generalized cervical spine degeneration with multilevel anterolisthesis. Other neck: No acute finding Upper chest: No acute finding Review of the MIP images confirms the above findings CTA HEAD FINDINGS Anterior circulation: Atheromatous calcification of the carotid siphons. No branch occlusion, beading, or aneurysm. No proximal flow limiting stenosis. Posterior circulation: Left dominant vertebral artery. The vertebral and basilar arteries are widely patent. High-grade narrowing at the left PCA bifurcation although no major branch occlusion. No beading or aneurysm Venous sinuses: Unremarkable for the arterial phase Anatomic variants: None significant Review of the MIP images confirms the above findings CT Brain Perfusion Findings: ASPECTS: 10 CBF (<30%) Volume: 0mL Perfusion (Tmax>6.0s) volume: 0mL With very strict time parameters there is  relatively delayed perfusion in the right MCA upper division and watershed territories. IMPRESSION: 1. No emergent large vessel occlusion. 2. No infarct or ischemia by standard threshold CT perfusion. With very strict time parameters there is relatively delayed perfusion to the upper division right MCA territory and watershed which may be related to a 50-55% right ICA origin stenosis. 3. High-grade left PCA branch narrowing. Electronically Signed   By: Tiburcio Pea M.D.   On: 06/06/2023 05:27   CT HEAD CODE STROKE WO CONTRAST  Result Date: 06/06/2023 CLINICAL DATA:  Code stroke.  Left-sided weakness EXAM: CT HEAD WITHOUT CONTRAST TECHNIQUE: Contiguous axial images were obtained from the base of the skull through the vertex without intravenous contrast. RADIATION DOSE REDUCTION: This exam was performed according to the departmental dose-optimization program which includes automated exposure control, adjustment of the mA and/or kV according to patient size and/or use of iterative reconstruction technique. COMPARISON:  Head CT 04/06/2023 FINDINGS: Brain: No evidence of acute infarction, hemorrhage, hydrocephalus, extra-axial collection or mass lesion/mass effect. Small patchy areas of chronic cortically based infarct along the superior right frontal lobe best seen on sagittal reformats. Generalized cerebral volume loss and chronic small vessel ischemia. Vascular: No hyperdense vessel or unexpected calcification. Right MCA branch region calcification is chronic. Skull: Normal. Negative for fracture or focal lesion. Sinuses/Orbits: Preferential gaze to the right Other: Discussed with Dr. Ezzie Dural while in progress ASPECTS [facility] Stroke Program Early CT Score) - Ganglionic level infarction (caudate, lentiform nuclei, internal capsule, insula, M1-M3 cortex): 7 - Supraganglionic infarction (M4-M6 cortex): 3 Total score (0-10 with 10 being normal): 10 IMPRESSION: 1. No acute finding. 2. Atrophy and chronic small vessel  ischemia with small chronic right frontal cortex infarcts. Electronically Signed   By: Tiburcio Pea M.D.   On: 06/06/2023 05:18        Scheduled Meds:   stroke: early stages of recovery book   Does not apply Once   aspirin  300 mg Rectal Daily   donepezil  5 mg Oral Daily  enoxaparin (LOVENOX) injection  40 mg Subcutaneous Q24H   gabapentin  200 mg Oral Daily   levothyroxine  50 mcg Oral QAC breakfast   nitrofurantoin (macrocrystal-monohydrate)  100 mg Oral QHS   pantoprazole (PROTONIX) IV  40 mg Intravenous Q24H   polyethylene glycol  17 g Oral Daily   QUEtiapine  25 mg Oral QHS   simvastatin  20 mg Oral Daily   ticagrelor  90 mg Oral BID   Continuous Infusions:  sodium chloride 75 mL/hr at 06/06/23 2348     LOS: 1 day    Time spent:    Azucena Fallen, DO [facility]  If 7PM-7AM, please contact night-coverage [email or web address]  06/07/2023, 7:31 AM

## 2023-06-07 NOTE — Evaluation (Signed)
Physical Therapy Evaluation Patient Details Name: Sarah Freeman MRN: [id number] DOB: [birth year] Today's Date: 06/07/2023  History of Present Illness  Pt is a 89 y/o female presenting after a fall at home and L sided weakness. MRI brain showed acute infarct in deep R cerebral white matter impacting external capsule and corona radiata, as well as small R frontal and parietal white matter infarcts. PMH: dementia, prior CVA in 2017 and May 2024, hypertension, hyperlipidemia, bilateral carotid artery stenosis, hypothyroidism, stage III CKD, GERD, aortic atherosclerosis   Clinical Impression  Pt admitted with above. Pt with know dementia PTA and has 24/7 care at home in which she was functioning a supervision level for cognitive deficits more so than functional deficits. Pt now presenting with L hemiparesis, further confusion, L inattention, impaired balance, and is currently requiring maxAX2 for transfers. Pt to benefit from inpatient rehab program < 3 hours/day to address above deficits to allow for safe transition home with 24/7 assist. Son, Link Snuffer stated they preferred [facility].        Assistance Recommended at Discharge Frequent or constant Supervision/Assistance  If plan is discharge home, recommend the following:  Can travel by private vehicle  A lot of help with walking and/or transfers;A lot of help with bathing/dressing/bathroom;Assist for transportation;Help with stairs or ramp for entrance   No    Equipment Recommendations None recommended by PT  Recommendations for Other Services       Functional Status Assessment Patient has had a recent decline in their functional status and demonstrates the ability to make significant improvements in function in a reasonable and predictable amount of time.     Precautions / Restrictions Precautions Precautions: Fall;Other (comment) Precaution Comments: impulsive, R mitt, urine incontinence, L inattn Restrictions Weight Bearing  Restrictions: No      Mobility  Bed Mobility Overal bed mobility: Needs Assistance Bed Mobility: Supine to Sit, Sit to Supine     Supine to sit: Max assist, +2 for physical assistance, +2 for safety/equipment, HOB elevated Sit to supine: Max assist, +2 for physical assistance, +2 for safety/equipment   General bed mobility comments: initially attempted to exit L side though pt initiating to R side only. Pt able to bring R LE to EOB and pull on bedrail with R hand. Assist for LLE and truncal assist to sit EOB. Assist for trunk and LE back to bed    Transfers Overall transfer level: Needs assistance Equipment used: 2 person hand held assist (face to face transfer with gait belt and hands on buttocks) Transfers: Sit to/from Stand, Bed to chair/wheelchair/BSC Sit to Stand: Max assist, +2 physical assistance, +2 safety/equipment Stand pivot transfers: Max assist, +2 physical assistance, +2 safety/equipment         General transfer comment: Minimal initiation or activation of L side to/from [facility] assist needed to swing hips from surface to surface and maintain balance, pt resistant due to "I need to pee right now" and now comprehending that she could urinate in the [facility]    Ambulation/Gait               General Gait Details: unable  Stairs            Wheelchair Mobility     Tilt Bed    Modified Rankin (Stroke Patients Only) Modified Rankin (Stroke Patients Only) Pre-Morbid Rankin Score: Moderate disability Modified Rankin: Severe disability     Balance Overall balance assessment: Needs assistance Sitting-balance support: Feet supported, No upper extremity supported Sitting balance-Leahy  Scale: Fair Sitting balance - Comments: initially Min A regaining to close min guard, posterior bias, minimal use of L UE to support self   Standing balance support: Bilateral upper extremity supported, During functional activity Standing balance-Leahy Scale: Poor Standing  balance comment: dependent on external support                             Pertinent Vitals/Pain Pain Assessment Pain Assessment: No/denies pain Faces Pain Scale: No hurt    Home Living Family/patient expects to be discharged to:: Private residence Living Arrangements: Alone Available Help at Discharge: Family;Available 24 hours/day;Personal care attendant Type of Home: House Home Access: Stairs to enter Entrance Stairs-Rails: Left Entrance Stairs-Number of Steps: 3   Home Layout: One level Home Equipment: Agricultural consultant (2 wheels);Grab bars - tub/shower Additional Comments: some info from prior admission and some from limited convo w/ family on eval. After DC from [facility] SN[facility], family arranged 24/7 care with family and hired caregivers at patients home    Prior Function Prior Level of Function : Needs assist  Cognitive Assist : Mobility (cognitive);ADLs (cognitive) Mobility (Cognitive): Intermittent cues ADLs (Cognitive): Intermittent cues       Mobility Comments: using RW for mobility ADLs Comments: Since DC from SNF to home, pt has been receiving some assist for LB bathing/dressing as needed, able to make sandwich and coffee at home. walking to bathroom, wears depends     Hand Dominance   Dominant Hand: Right    Extremity/Trunk Assessment   Upper Extremity Assessment Upper Extremity Assessment: Defer to OT evaluation LUE Deficits / Details: some increased tone with stretching of digits and elbow but easily stretched passively. some light/weak grasp but noted inattention LUE Sensation: decreased light touch LUE Coordination: decreased fine motor;decreased gross motor    Lower Extremity Assessment Lower Extremity Assessment: LLE deficits/detail LLE Deficits / Details: pt unable to follow commands for MMT, pt with noted hyperextension of L knee in standing, minimal active movement, noted less tone    Cervical / Trunk Assessment Cervical / Trunk  Assessment: Kyphotic  Communication   Communication: HOH  Cognition Arousal/Alertness: Awake/alert Behavior During Therapy: Restless, Impulsive Overall Cognitive Status: Impaired/Different from baseline (history of dementia but now more hostile) Area of Impairment: Orientation, Attention, Following commands, Memory, Safety/judgement, Awareness, Problem solving                 Orientation Level: Disoriented to, Place, Time, Situation Current Attention Level: Focused Memory: Decreased short-term memory Following Commands: Follows one step commands inconsistently, Follows one step commands with increased time Safety/Judgement: Decreased awareness of safety, Decreased awareness of deficits Awareness: Intellectual Problem Solving: Difficulty sequencing, Requires verbal cues, Requires tactile cues General Comments: A&Ox1, reports being at her house, demanding therapists fold the linens in the chair and go in the other room to get her depends. Inconsistent command following due to confusion and impulsivity, attempting to stand without assist. Requested to use bathroom - cued for BSC due to L sided weakness though pt would not urinate while on BSC ; wanting mesh underwear off then on, refused to urinate on BSC because "I want to go into my bathroom" pt un able to redirected        General Comments General comments (skin integrity, edema, etc.): pt with a skin tear on nose that eventually stopped bleeding. VSS, Son, Link Snuffer entered room at completion of treatment, able to give PLOF and accurate home set up  Exercises     Assessment/Plan    PT Assessment Patient needs continued PT services  PT Problem List Decreased strength;Decreased range of motion;Decreased activity tolerance;Decreased mobility;Decreased balance;Decreased coordination;Decreased cognition;Decreased knowledge of use of DME;Decreased safety awareness;Decreased knowledge of precautions       PT Treatment Interventions DME  instruction;Gait training;Stair training;Functional mobility training;Therapeutic activities;Therapeutic exercise;Balance training;Neuromuscular re-education    PT Goals (Current goals can be found in the Care Plan section)  Acute Rehab PT Goals Patient Stated Goal: unable to state PT Goal Formulation: With patient/family Time For Goal Achievement: 06/21/23 Potential to Achieve Goals: Good    Frequency Min 3X/week     Co-evaluation PT/OT/SLP Co-Evaluation/Treatment: Yes Reason for Co-Treatment: Necessary to address cognition/behavior during functional activity;For patient/therapist safety;To address functional/ADL transfers PT goals addressed during session: Mobility/safety with mobility OT goals addressed during session: ADL's and self-care;Proper use of Adaptive equipment and DME       AM-PAC PT "6 Clicks" Mobility  Outcome Measure Help needed turning from your back to your side while in a flat bed without using bedrails?: A Lot Help needed moving from lying on your back to sitting on the side of a flat bed without using bedrails?: A Lot Help needed moving to and from a bed to a chair (including a wheelchair)?: A Lot Help needed standing up from a chair using your arms (e.g., wheelchair or bedside chair)?: A Lot Help needed to walk in hospital room?: Total Help needed climbing 3-5 steps with a railing? : Total 6 Click Score: 10    End of Session Equipment Utilized During Treatment: Gait belt Activity Tolerance: Treatment limited secondary to agitation Patient left: in bed;with call bell/phone within reach;with bed alarm set;with family/visitor present Nurse Communication: Mobility status (the need for a purwick placement due to soiled purwick) PT Visit Diagnosis: Unsteadiness on feet (R26.81);Muscle weakness (generalized) (M62.81);Hemiplegia and hemiparesis Hemiplegia - Right/Left: Left Hemiplegia - dominant/non-dominant: Non-dominant Hemiplegia - caused by: Cerebral  infarction    Time: 1610-9604 PT Time Calculation (min) (ACUTE ONLY): 41 min   Charges:   PT Evaluation $PT Eval Moderate Complexity: 1 Mod   PT General Charges $$ ACUTE PT VISIT: 1 Visit         Lewis Shock, PT, DPT Acute Rehabilitation Services Secure chat preferred Office #: [phone]   Iona Hansen 06/07/2023, 11:20 AM

## 2023-06-08 ENCOUNTER — Other Ambulatory Visit: Payer: Self-pay

## 2023-06-08 DIAGNOSIS — R2990 Unspecified symptoms and signs involving the nervous system: Secondary | ICD-10-CM | POA: Diagnosis not present

## 2023-06-08 DIAGNOSIS — I6930 Unspecified sequelae of cerebral infarction: Secondary | ICD-10-CM

## 2023-06-08 LAB — GLUCOSE, CAPILLARY
Glucose-Capillary: 112 mg/dL — ABNORMAL HIGH (ref 70–99)
Glucose-Capillary: 120 mg/dL — ABNORMAL HIGH (ref 70–99)
Glucose-Capillary: 122 mg/dL — ABNORMAL HIGH (ref 70–99)
Glucose-Capillary: 123 mg/dL — ABNORMAL HIGH (ref 70–99)
Glucose-Capillary: 136 mg/dL — ABNORMAL HIGH (ref 70–99)
Glucose-Capillary: 88 mg/dL (ref 70–99)

## 2023-06-08 MED ORDER — ASPIRIN 81 MG PO TBEC
81.0000 mg | DELAYED_RELEASE_TABLET | Freq: Every day | ORAL | Status: DC
  Administered 2023-06-09: 81 mg via ORAL
  Filled 2023-06-08: qty 1

## 2023-06-08 NOTE — Progress Notes (Addendum)
STROKE TEAM PROGRESS NOTE   INTERVAL HISTORY Her daughter is at the bedside. Pt lifted to chair, still lethargic and sleepy with eyes closed. However, easily open on voice and command.   Vitals:   06/08/23 0410 06/08/23 0752 06/08/23 1226 06/08/23 1537  BP: (!) 151/79 (!) 166/82 130/65 (!) 151/66  Pulse: 76 75 94 80  Resp:  18 18 18   Temp: 98.6 F (37 C) 98.2 F (36.8 C) 98.6 F (37 C) 99.6 F (37.6 C)  TempSrc: Oral Oral Axillary Oral  SpO2: (!) 84% 100% 91% 90%  Weight:      Height:       CBC:  Recent Labs  Lab 06/06/23 0447 06/06/23 0450  WBC 7.7  --   NEUTROABS 3.3  --   HGB 12.0 12.6  HCT 36.8 37.0  MCV 95.6  --   PLT 268  --    Basic Metabolic Panel:  Recent Labs  Lab 06/06/23 0447 06/06/23 0450  NA 137 138  K 3.8 3.9  CL 102 103  CO2 25  --   GLUCOSE 101* 98  BUN 9 9  CREATININE 0.97 1.00  CALCIUM 8.8*  --    Lipid Panel: No results for input(s): "CHOL", "TRIG", "HDL", "CHOLHDL", "VLDL", "LDLCALC" in the last 168 hours. HgbA1c: No results for input(s): "HGBA1C" in the last 168 hours. Urine Drug Screen:  Recent Labs  Lab 06/06/23 1750  LABOPIA NONE DETECTED  COCAINSCRNUR NONE DETECTED  LABBENZ NONE DETECTED  AMPHETMU NONE DETECTED  THCU NONE DETECTED  LABBARB NONE DETECTED    Alcohol Level  Recent Labs  Lab 06/06/23 0447  ETH <10    IMAGING past 24 hours No results found.  PHYSICAL EXAM  Temp:  [97.8 F (36.6 C)-99.6 F (37.6 C)] 99.6 F (37.6 C) (07/10 1537) Pulse Rate:  [62-94] 80 (07/10 1537) Resp:  [18-20] 18 (07/10 1537) BP: (117-166)/(64-82) 151/66 (07/10 1537) SpO2:  [84 %-100 %] 90 % (07/10 1537)  General - Well nourished, well developed, in no apparent distress. Cardiovascular - Regular rhythm and rate.  Mental Status -  Patient is drowsy sleepy, with eyes closed, but easily open eyes to voice, more awake than yesterday.  She is orientated to place, people and age but not to time.  Pupils are equal and reactive, eyes  midline, not consistently blinking to visual threat.  She followed simple commands, no aphasia but mild to moderate dysarthria.  She has a left facial droop, tongue deviates to the left.  Right upper extremity no drift, left upper extremity 1/5 on pain stimulation. On sitting position, RLE 2/5 and LLE mild withdraw to pain.  Sensation, coordination not cooperative and gait not tested.   ASSESSMENT/PLAN [name] is a 89 y.o. female with history of HTN, Hypothyroidism, prior CVA and dementia presenting with  left side weakness and right gaze deviation. She had fallen when she got up to go to the bathroom and hollered out, caretaker founs her on the floor with above symptoms  Stroke:  Acute right CR and parietal periventricular WM ischemic infarcts  Etiology:  small vs. large vessel disease   CT head No acute abnormality. Small vessel disease. ASPECTS 10. Right MCA branch region calcification is chronic   CTA head & neck No LVO, Proximal R ICA stenosis measures 50% on coronal reformats.  CT perfusion  no infarct or ischemia  MRI  acute right external capsule and corona radiata 2D Echo  (5/9) EF 60-65%. LV with grade  I diastolic dysfunction EEG mild diffuse encephalopathy LDL 78 HgbA1c 6.0 VTE prophylaxis -Lovenox aspirin 81 mg daily and clopidogrel 75 mg daily prior to admission, now on aspirin 81 mg and Brilinta 90 mg twice daily for 30 days then Plavix 75 mg daily for monotherapy. Family declined OCEANIC trial Therapy recommendations:  SNF  Disposition:  pending   Hx of stroke 01/2016 presented with left-sided numbness and weakness.  MRI showed many right cortical and subcortical small infarcts.  Carotid Doppler negative.  EF 70%, A1c 5.8, LDL 150.  Discharged on Plavix. 12/2017 admitted for inability to control of left upper extremity.  MRI showed a few small acute infarct in the right parietal deep white matter.  Concerning for embolic source.  Carotid Doppler negative.  EF 60 to  65%.  A1c 6.1, LDL 88.  TEE EF 55 to 60%.  Loop recorder placed For 2 years of loop recorder monitoring, no A-fib found. 03/2023 admitted for left leg weakness.  MRI showed small acute infarct in the right CR.  MRA head and neck intracranial atherosclerosis involving bilateral PCAs.  Discharged on DAPT for 3 months and then aspirin alone, as well as Lipitor 80. Followed up with Dr. Malvin Johns at [facility]  Hypertension Home meds:  none Stable Per family, patient baseline BP at home 90s to 110s Avoid low BP, avoid dehydration Long-term BP goal normotensive  Hyperlipidemia Home meds:  simvastatin 20, resumed in hospital LDL 78, goal < 70 No high intensity statin given advanced age and LDL not far from goal. Continue Zocor 20 at discharge  Other Stroke Risk Factors Advanced Age >/= 25  Obesity, Body mass index is 38.26 kg/m., BMI >/= 30 associated with increased stroke risk, recommend weight loss, diet and exercise as appropriate   Other Active Problems Hypothyroidism  Hospital day # 2  Neurology will sign off. Please call with questions. Pt will follow up with [facility] Dr. Malvin Johns in 4 weeks and [place] on 08/29/23. Thanks for the consult.   Marvel Plan, MD PhD Stroke Neurology 06/08/2023 3:50 PM     To contact Stroke Continuity provider, please refer to [email or web address]. After hours, contact General Neurology

## 2023-06-08 NOTE — Plan of Care (Signed)
  Problem: Education: Goal: Knowledge of disease or condition will improve Outcome: Progressing   Problem: Ischemic Stroke/TIA Tissue Perfusion: Goal: Complications of ischemic stroke/TIA will be minimized Outcome: Progressing   Problem: Health Behavior/Discharge Planning: Goal: Ability to manage health-related needs will improve Outcome: Progressing Goal: Goals will be collaboratively established with patient/family Outcome: Progressing   Problem: Self-Care: Goal: Ability to participate in self-care as condition permits will improve Outcome: Progressing   Problem: Nutrition: Goal: Risk of aspiration will decrease Outcome: Progressing Goal: Dietary intake will improve Outcome: Progressing

## 2023-06-08 NOTE — TOC Initial Note (Signed)
Transition of Care [facility]) - Initial/Assessment Note    Patient Details  Name: Sarah Freeman MRN: [id number] Date of Birth: 22-Jun-1931  Transition of [facility]) CM/SW Contact:    Baldemar Lenis, LCSW Phone Number: 06/08/2023, 2:01 PM  Clinical Narrative:      CSW met with patient and daughter at bedside to discuss SNF placement. CSW heard previously that patient's son had been at bedside and asking for [facility] for SNF, daughters are asking for [facility] as she has been there before. Lengthy discussion with family on placement options and insurance coverage, as patient will be in copay days soon. Questions answered. CSW completed referral and sent to both [facility] and [facility]. CSW spoke with Admissions at [facility] to discuss referral, they do not have a bed available to offer the patient. CSW attempted to reach [facility] throughout the day, called multiple times and it was busy each time. Attempted to reach the Admissions cell phone and never received an answer, sent text messages. CSW met with daughter at bedside to update that [facility] could not be reached today and will try again tomorrow. CSW to follow.        Expected Discharge Plan: Skilled Nursing Facility Barriers to Discharge: Continued Medical Work up, English as a second language teacher   Patient Goals and CMS Choice Patient states their goals for this hospitalization and ongoing recovery are:: patient unable to participate in goal setting, not fully oriented CMS Medicare.gov Compare Post Acute Care list provided to:: Patient Represenative (must comment) Choice offered to / list presented to : Adult Children [place] ownership interest in [facility].provided to:: Adult Children    Expected Discharge Plan and Services     Post Acute Care Choice: Skilled Nursing Facility Living arrangements for the past 2 months: Single Family Home, Skilled Nursing Facility                                       Prior Living Arrangements/Services Living arrangements for the past 2 months: Single Family Home, Skilled Nursing Facility Lives with:: Self Patient language and need for interpreter reviewed:: No Do you feel safe going back to the place where you live?: Yes      Need for Family Participation in Patient Care: Yes (Comment) Care giver support system in place?: Yes (comment)   Criminal Activity/Legal Involvement Pertinent to Current Situation/Hospitalization: No - Comment as needed  Activities of Daily Living Home Assistive Devices/Equipment: Environmental consultant (specify type), Eyeglasses, Hearing aid ADL Screening (condition at time of admission) Patient's cognitive ability adequate to safely complete daily activities?: No (admission questions answered by pt son at bedside) Is the patient deaf or have difficulty hearing?: Yes Does the patient have difficulty seeing, even when wearing glasses/contacts?: Yes Does the patient have difficulty concentrating, remembering, or making decisions?: Yes Patient able to express need for assistance with ADLs?: Yes Does the patient have difficulty dressing or bathing?: Yes Independently performs ADLs?: No Does the patient have difficulty walking or climbing stairs?: Yes Weakness of Legs: Both Weakness of Arms/Hands: Both  Permission Sought/Granted Permission sought to share information with : Facility Medical sales representative, Family Supports Permission granted to share information with : Yes, Verbal Permission Granted  Share Information with NAME: Stann Mainland  Permission granted to share info w AGENCY: SNF  Permission granted to share info w Relationship: Children     Emotional Assessment  Appearance:: Appears stated age Attitude/Demeanor/Rapport: Unable to Assess Affect (typically observed): Unable to Assess Orientation: : Oriented to Self Alcohol / Substance Use: Not Applicable Psych Involvement: No (comment)  Admission  diagnosis:  Dysarthria [R47.1] Stroke-like symptoms [R29.90] Patient Active Problem List   Diagnosis Date Noted   Stroke-like symptoms 06/06/2023   Left hemiplegia (HCC) 05/19/2023   History of CVA with residual deficit 05/17/2023   Dyslipidemia 04/06/2023   Memory loss 12/29/2021   Aortic atherosclerosis (HCC) 02/27/2021   SVT (supraventricular tachycardia) 10/11/2018   HTN (hypertension) 10/11/2018   Chronic kidney disease, stage 3 (HCC) 05/24/2018   Morbid obesity (HCC) 02/02/2018   Carotid stenosis 02/01/2018   Left arm weakness 01/18/2018   Chronic UTI 11/04/2017   Statin intolerance 06/14/2017   Counseling regarding advanced directives and goals of care 02/22/2017   Weakness of left leg 02/25/2016   TIA (transient ischemic attack) 02/15/2016   Osteopenia 01/29/2016   Menopausal state 01/29/2016   Hypothyroidism 01/29/2016   GERD (gastroesophageal reflux disease) 01/29/2016   Hypercholesterolemia 01/29/2016   Lumbar spinal stenosis 01/29/2016   Stress incontinence 01/29/2016   Breast microcalcification, mammographic 06/17/2015   PCP:  Dorcas Carrow, DO Pharmacy:   [facility] - [place], [place] - [address] [place] [address] Phone: [phone] Fax: [phone]  [id number] - [place], [place] - [address] AT [facility] [address] [place] [address] [place] [address] Phone: [phone] Fax: [phone]     Social Determinants of Health (SDOH) Social History: SDOH Screenings   Food Insecurity: No Food Insecurity (06/08/2023)  Housing: Low Risk  (06/08/2023)  Transportation Needs: No Transportation Needs (06/08/2023)  Utilities: Not At Risk (06/08/2023)  Alcohol Screen: Low Risk  (02/01/2023)  Depression (PHQ2-9): Low Risk  (05/17/2023)  Financial Resource Strain: Low Risk  (02/01/2023)  Physical Activity: Insufficiently Active (02/01/2023)  Social Connections: Moderately Isolated  (02/01/2023)  Stress: No Stress Concern Present (02/01/2023)  Tobacco Use: Low Risk  (06/06/2023)   SDOH Interventions:     Readmission Risk Interventions     No data to display

## 2023-06-08 NOTE — Progress Notes (Signed)
Patient resting in bed with eyes closed. Mittens off. Patient's family members at the bedside. NAD. Respirations are even and unlabored. RA.

## 2023-06-08 NOTE — Progress Notes (Signed)
PROGRESS NOTE Sarah Freeman  ZOX:[id number] DOB: Jul 20, 1931 DOA: 06/06/2023 PCP: Dorcas Carrow, DO  Brief Narrative/Hospital Course: 89 yof with medical history significant of dementia, prior CVA in 2017, hypertension, hyperlipidemia, bilateral carotid artery stenosis, hypothyroidism, stage III CKD, GERD, aortic atherosclerosis, and recent hospitalization in May 2024 and found to have a small acute infarct in the right corona radiata presented after fall, unwitnessed, at night at home where she was witnessed to not be moving her left side. Patient was seen as a code stroke and admitted for further management. Seen by neurology underwent extensive workup found to have  Acute right CR and parietal periventricular WM ischemic infarcts , compared to CT head, CTA head- Right MCA branch region calcification is chronic  and neck,-no LVO, Proximal R ICA stenosis measures 50% on coronal reformats   CT perfusion, 2D echo with EF 60 to 65%, G1 DD EEG-mild diffuse encephalopathy, LDL 78 HbA1c 6.0. Neurology advised aspirin 81 mg plus Brilinta 90 mg x 30 days then Plavix 75 mg daily as monotherapy.  PTA on aspirin 81 and Plavix 75.  No high intensity statin given 89 years of age and LDL not far from and to continue Zocor 20 at discharge. Other issues>history of previous stroke, hypertension not on home meds, hyperlipidemia CKD stage III yea chronic UTI on Macrobid, memory loss with questionable vascular dementia Patient has been stabilized,awaiting for skilled nursing facility     Subjective: Seen and examined Seen at the bedside Patient is mildly sleepy able to wake up oriented to self she thinks brother is at the bedside not son  Assessment and Plan: Principal Problem:   Stroke-like symptoms Active Problems:   Hypothyroidism   Statin intolerance   Chronic UTI   Chronic kidney disease, stage 3 (HCC)   Dyslipidemia   Acute right CR and parietal periventricular WM ischemic infarcts w/ left-sided  weakness Seizure unlikely 01/2016:left-sided numbness and weakness- MRI with right cortical and subcortical small infarct 12/2017 with a few small acute infarct on the right parietal deep white matter-had loop recorder for 2 years no A-fib found underwent extensive workup found to have acute right TCAR and parietal periventricular AVM ischemic infarcts, compared to CT head, CTA head- Right MCA branch region calcification is chronic  and neck,-no LVO, Proximal R ICA stenosis measures 50% on coronal reformats   CT perfusion, recent 2D echo with EF 60 to 65%, G1 DD; EEG-mild diffuse encephalopathy, LDL 78 HbA1c 6.0. Neurology advised aspirin 81 mg plus Brilinta 90 mg x 30 days then Plavix 75 mg daily as monotherapy.  PTA on aspirin 81 and Plavix 75.  No high intensity statin given 89 years of age and LDL not far from and to continue Zocor 20 at discharge.  Given recent smaller stroke with recurrence now in the same area but larger while on Plavix and aspirin neurology recommending further evaluation for study for factor XI inhibitor ([place] trial)> but patient family declined WAITING on a skilled nursing facility placement.  Hypertension: BP fairly controlled allowed permissive hypertension-resume home meds in next 24 hours  Chronic Kidney Disease, Stage 3a Creatinine remains stable, encourage oral intake Recent Labs    01/04/23 0000 04/06/23 1136 04/07/23 0600 04/08/23 0607 04/12/23 0641 04/14/23 0555 04/16/23 0419 04/17/23 0506 04/18/23 0530 04/28/23 0000 05/17/23 1524 06/06/23 0447 06/06/23 0450  BUN 10 10 10 11   --   --  12 13 13 12  8* 9 9  CREATININE 0.90 0.96 0.84 0.94 0.94 0.87 0.92 0.78 0.84 0.9 0.95 0.97 1.00  CO2 23 24 24 23   --   --  24 24 25  25* 24 25  --     Chronic UTI: On Macrobid   Memory Loss ?  Vascular dementia in the setting of strokes and small vessel disease-PTA on Aricept previously able to perform all ADLs she is now limited to simple tasks including feeding  herself, ambulate independently but denies patient has been able to close or bathe herself independently for some time. Continue supportive care fall precaution    Hypothyroidism: Continue home levothyroxine  Obesity:Patient's Body mass index is 38.26 kg/m. : Will benefit with PCP follow-up, weight loss  healthy lifestyle and outpatient sleep evaluation.  Goals of care: Confirmed DNR status, overall prognosis does not.  Bright if patient fails to improve despite rehabilitation will benefit with palliative care evaluation/hospice evaluation, informed son.  Patient has desired no feeding tube and no resuscitative effort as per son  DVT prophylaxis: enoxaparin (LOVENOX) injection 40 mg Start: 06/06/23 0930 Code Status:   Code Status: DNR Family Communication: plan of care discussed with patient/son at bedside. Patient status is:  inpatient because of awaiting SNF Level of care: Progressive   Dispo: The patient is from: HOME            Anticipated disposition: SNF in Objective: Vitals last 24 hrs: Vitals:   06/07/23 2357 06/08/23 0005 06/08/23 0410 06/08/23 0752  BP: (!) 164/77 117/72 (!) 151/79 (!) 166/82  Pulse: 80 62 76 75  Resp:    18  Temp: 97.8 F (36.6 C) 98.2 F (36.8 C) 98.6 F (37 C) 98.2 F (36.8 C)  TempSrc: Oral Oral Oral Oral  SpO2: 98% 96% (!) 84% 100%  Weight:      Height:       Weight change:   Physical Examination: General exam: Somnolent able to wake up and tell me her name and oriented to self  HEENT:Oral mucosa moist, Ear/Nose WNL grossly Respiratory system: bilaterally CLEAR BS, no use of accessory muscle Cardiovascular system: S1 & S2 +, No JVD. Gastrointestinal system: Abdomen soft,NT,ND, BS+ Nervous System:Alert, awake, moving extremities. Extremities: mits+, unable to move lower extremities  Skin: No rashes,no icterus. MSK: Normal muscle bulk,tone, power  Medications reviewed:  Scheduled Meds:  [START ON 06/09/2023] aspirin EC  81 mg Oral Daily    donepezil  5 mg Oral Daily   enoxaparin (LOVENOX) injection  40 mg Subcutaneous Q24H   gabapentin  200 mg Oral Daily   levothyroxine  50 mcg Oral QAC breakfast   nitrofurantoin (macrocrystal-monohydrate)  100 mg Oral QHS   pantoprazole (PROTONIX) IV  40 mg Intravenous Q24H   polyethylene glycol  17 g Oral Daily   QUEtiapine  25 mg Oral QHS   simvastatin  20 mg Oral Daily   ticagrelor  90 mg Oral BID   Continuous Infusions:    Diet Order             DIET - DYS 1 Room service appropriate? Yes with Assist; Fluid consistency: Thin  Diet effective now                   Intake/Output Summary (Last 24 hours) at 06/08/2023 1120 Last data filed at 06/08/2023 0800 Gross per 24 hour  Intake 417 ml  Output --  Net 417 ml   Net IO Since Admission: -42.64 mL [06/08/23 1120]  Wt Readings from Last 3 Encounters:  06/06/23 77.4 kg  05/24/23 74.9 kg  05/17/23 75.1 kg  Unresulted Labs (From admission, onward)     Start     Ordered   06/13/23 0500  Creatinine, serum  (enoxaparin (LOVENOX)    CrCl >/= 30 ml/min)  Weekly,   R     Comments: while on enoxaparin therapy    06/15/2023 1610          Data Reviewed: I have personally reviewed following labs and imaging studies CBC: Recent Labs  Lab 15-Jun-2023 0447 06/15/23 0450  WBC 7.7  --   NEUTROABS 3.3  --   HGB 12.0 12.6  HCT 36.8 37.0  MCV 95.6  --   PLT 268  --    Basic Metabolic Panel: Recent Labs  Lab 2023/06/15 0447 15-Jun-2023 0450  NA 137 138  K 3.8 3.9  CL 102 103  CO2 25  --   GLUCOSE 101* 98  BUN 9 9  CREATININE 0.97 1.00  CALCIUM 8.8*  --    GFR: Estimated Creatinine Clearance: 31.1 mL/min (by C-G formula based on SCr of 1 mg/dL). Liver Function Tests: Recent Labs  Lab 2023-06-15 0447  AST 43*  ALT 38  ALKPHOS 51  BILITOT 0.5  PROT 6.4*  ALBUMIN 2.9*   Recent Labs  Lab 2023-06-15 0447  INR 1.0   CBG: Recent Labs  Lab 06/07/23 1702 06/07/23 2203 06/08/23 0001 06/08/23 0411 06/08/23 0751   GLUCAP 107* 136* 136* 88 112*  No results found for this or any previous visit (from the past 240 hour(s)).  Antimicrobials: Anti-infectives (From admission, onward)    Start     Dose/Rate Route Frequency Ordered Stop   06-15-2023 2200  nitrofurantoin (macrocrystal-monohydrate) (MACROBID) capsule 100 mg        100 mg Oral Daily at bedtime 2023-06-15 9604        Culture/Microbiology  Radiology Studies: EEG adult  Result Date: 06/15/23 Charlsie Quest, MD     2023/06/15  3:54 PM Patient Name: Sarah Freeman MRN: [id number] Epilepsy Attending: Charlsie Quest Referring Physician/Provider: Caryl Pina, MD Date: June 15, 2023 Duration: 22.43 mins Patient history: 89yo F left sided weakness and R gaze deviation. EEG to evaluate for seizure Level of alertness: Awake AEDs during EEG study: GBP Technical aspects: This EEG study was done with scalp electrodes positioned according to the 10-20 International system of electrode placement. Electrical activity was reviewed with band pass filter of 1-70Hz , sensitivity of 7 uV/mm, display speed of 3mm/sec with a 60Hz  notched filter applied as appropriate. EEG data were recorded continuously and digitally stored.  Video monitoring was available and reviewed as appropriate. Description: The posterior dominant rhythm consists of 7.5 Hz activity of moderate voltage (25-35 uV) seen predominantly in posterior head regions, symmetric and reactive to eye opening and eye closing. EEG showed intermittent generalized 3 to 6 Hz theta-delta slowing. Hyperventilation and photic stimulation were not performed.   ABNORMALITY - Intermittent slow, generalized IMPRESSION: This study is suggestive of mild diffuse encephalopathy, nonspecific etiology. No seizures or epileptiform discharges were seen throughout the recording. Charlsie Quest   DG Swallowing Func-Speech Pathology  Result Date: 06-15-23 Table formatting from the original result was not included. Modified Barium  Swallow Study Patient Details Name: Sarah Freeman MRN: [id number] Date of Birth: 1931/09/06 Today's Date: 06-15-2023 HPI/PMH: HPI: Sarah Freeman is a 89 y.o. female who presented to [facility] via EMS as a Code Stroke with left sided weakness and R gaze deviation, after getting up to go to the bathroom in the middle of the night and  falling.  MRI 7/8: "Acute infarct in the deep right cerebral white matter affecting  external capsule and corona radiata. Small separate right frontal  and parietal white matter infarcts."  Pt with medical history significant of dementia, prior CVA in 2017, hypertension, hyperlipidemia, bilateral carotid artery stenosis, hypothyroidism, stage III CKD, GERD, aortic atherosclerosis, and recent hospitalization in May 2024 and found to have a small acute infarct in the right corona radiata Clinical Impression: Clinical Impression: Pt presents with an overall functional oropharyngeal swallow. Generalized oral weakness and acute L sided asymmetry contribute to instances of anterior spillage and trace residue, which lines the tongue and eventually clears with subsequent swallows. Pt had one instance of sensed aspiration during the initial teaspoonful of thin liquids that was independently ejected with a cough (PAS 6) in which the bolus spilled over the arytenoids during the swallow prior to complete closure of the laryngeal vestibule. No other instances of penetration or aspiration occurred with subsequent challenging sips of thin liquids, nectar thick liquids, honey thick liquids, or purees. When cued to take a bite of the graham cracker with purees, pt only took the puree; therefore solids were unable to be tested today. The pill was administered with thin liquids via straw and passed without difficulty. Pt may benefit from continued SLP f/u to address potential to upgrade diet and target functional compensatory strategies. Recommend initiating diet of Dys 1 textures with thin liquids and meds  given whole with liquids as mentation allows (or crushed in puree PRN). Recommend full superivison during meal times. Factors that may increase risk of adverse event in presence of aspiration [name]): Factors that may increase risk of adverse event in presence of aspiration [name]): Poor general health and/or compromised immunity; Reduced cognitive function; Limited mobility; Frail or deconditioned Recommendations/Plan: Swallowing Evaluation Recommendations Swallowing Evaluation Recommendations Recommendations: PO diet PO Diet Recommendation: Dysphagia 1 (Pureed); Thin liquids (Level 0) Liquid Administration via: Straw; Cup Medication Administration: Whole meds with liquid Supervision: Full supervision/cueing for swallowing strategies Swallowing strategies  : Minimize environmental distractions; Slow rate; Small bites/sips; Check for anterior loss Postural changes: Position pt fully upright for meals Oral care recommendations: Oral care BID (2x/day) Treatment Plan Treatment Plan Treatment recommendations: Therapy as outlined in treatment plan below Follow-up recommendations: Skilled nursing-short term rehab (<3 hours/day) Functional status assessment: Patient has had a recent decline in their functional status and demonstrates the ability to make significant improvements in function in a reasonable and predictable amount of time. Treatment frequency: Min 2x/week Treatment duration: 2 weeks Interventions: Aspiration precaution training; Compensatory techniques; Patient/family education; Trials of upgraded texture/liquids; Diet toleration management by SLP Recommendations Recommendations for follow up therapy are one component of a multi-disciplinary discharge planning process, led by the attending physician.  Recommendations may be updated based on patient status, additional functional criteria and insurance authorization. Assessment: Orofacial Exam: Orofacial Exam Oral Cavity: Oral  Hygiene: WFL Oral Cavity - Dentition: Dentures, top; Dentures, bottom Orofacial Anatomy: Other (comment) (L sided assymetry) Oral Motor/Sensory Function: Generalized oral weakness Anatomy: Anatomy: Suspected cervical osteophytes Boluses Administered: Boluses Administered Boluses Administered: Thin liquids (Level 0); Mildly thick liquids (Level 2, nectar thick); Moderately thick liquids (Level 3, honey thick); Puree  Oral Impairment Domain: Oral Impairment Domain Lip Closure: Escape progressing to mid-chin Tongue control during bolus hold: Not tested Bolus preparation/mastication: -- (unable to test solids) Bolus transport/lingual motion: Brisk tongue motion Oral residue: Trace residue lining oral structures Location of oral residue : Tongue Initiation of  pharyngeal swallow : Pyriform sinuses  Pharyngeal Impairment Domain: Pharyngeal Impairment Domain Soft palate elevation: No bolus between soft palate (SP)/pharyngeal wall (PW) Laryngeal elevation: Complete superior movement of thyroid cartilage with complete approximation of arytenoids to epiglottic petiole Anterior hyoid excursion: Partial anterior movement Epiglottic movement: Partial inversion Laryngeal vestibule closure: Incomplete, narrow column air/contrast in laryngeal vestibule Pharyngeal stripping wave : Present - diminished Pharyngeal contraction (A/P view only): N/A Pharyngoesophageal segment opening: Complete distension and complete duration, no obstruction of flow Tongue base retraction: Trace column of contrast or air between tongue base and PPW Pharyngeal residue: Complete pharyngeal clearance Location of pharyngeal residue: N/A  Esophageal Impairment Domain: Esophageal Impairment Domain Esophageal clearance upright position: Complete clearance, esophageal coating Pill: Pill Consistency administered: Thin liquids (Level 0) Thin liquids (Level 0): [facility] Penetration/Aspiration Scale Score: Penetration/Aspiration Scale Score 6.  Material enters airway,  passes BELOW cords then ejected out: Thin liquids (Level 0) Compensatory Strategies: Compensatory Strategies Compensatory strategies: Yes Straw: Effective   General Information: Caregiver present: Yes  Diet Prior to this Study: NPO   Temperature : Normal   Respiratory Status: WFL   Supplemental O2: None (Room air)   History of Recent Intubation: No  Behavior/Cognition: Alert; Cooperative; Requires cueing Self-Feeding Abilities: Needs assist with self-feeding Baseline vocal quality/speech: Normal Volitional Cough: Able to elicit Volitional Swallow: Able to elicit Exam Limitations: No limitations Goal Planning: Prognosis for improved oropharyngeal function: Good Barriers to Reach Goals: Cognitive deficits No data recorded Patient/Family Stated Goal: wants potatoes Consulted and agree with results and recommendations: Patient; Family member/caregiver Pain: Pain Assessment Pain Assessment: Faces Faces Pain Scale: 0 End of Session: Start Time:SLP Start Time (ACUTE ONLY): 1343 Stop Time: SLP Stop Time (ACUTE ONLY): 1401 Time Calculation:SLP Time Calculation (min) (ACUTE ONLY): 18 min Charges: SLP Evaluations $ SLP Speech Visit: 1 Visit SLP Evaluations $BSS Swallow: 1 Procedure $MBS Swallow: 1 Procedure SLP visit diagnosis: SLP Visit Diagnosis: Dysphagia, unspecified (R13.10) Past Medical History: Past Medical History: Diagnosis Date  Hyperlipidemia   Hypothyroid   Stroke [facility])   Urinary incontinence  Past Surgical History: Past Surgical History: Procedure Laterality Date  BREAST BIOPSY Left 2000?  neg. dr. Rutherford Nail office  CHOLECYSTECTOMY  1964  COLONOSCOPY    Dr Maryruth Bun  DILATION AND CURETTAGE OF UTERUS    EYE SURGERY    LOOP RECORDER INSERTION N/A 02/09/2018  Procedure: LOOP RECORDER INSERTION;  Surgeon: Duke Salvia, MD;  Location: [facility] INVASIVE CV LAB;  Service: Cardiovascular;  Laterality: N/A;  SKIN CANCER EXCISION    face  TEE WITHOUT CARDIOVERSION N/A 02/09/2018  Procedure: TRANSESOPHAGEAL ECHOCARDIOGRAM (TEE);   Surgeon: Antonieta Iba, MD;  Location: [facility] ORS;  Service: Cardiovascular;  Laterality: N/A;  TOTAL ABDOMINAL HYSTERECTOMY W/ BILATERAL SALPINGOOPHORECTOMY    age 54 Gwynneth Aliment, M.A., CF-SLP Speech Language Pathology, Acute Rehabilitation Services Secure Chat preferred [phone] 06/06/2023, 3:05 PM    LOS: 2 days   Lanae Boast, MD [facility]  06/08/2023, 11:20 AM

## 2023-06-08 NOTE — TOC Progression Note (Signed)
Transition of Care [facility]) - Progression Note    Patient Details  Name: DEIONA HOOPER MRN: [id number] Date of Birth: 1931/04/14  [facility] CM/SW Contact  Baldemar Lenis, [place] Phone Number: 06/08/2023, 2:07 PM  Clinical Narrative:   CSW spoke with [employer] today and they can offer a bed for patient. CSW met with patient's family at bedside and provided update, they are in agreement. CSW answered questions about copay information. CSW asked CMA to initiate insurance authorization request. CSW later contacted by RN that patient's family had other questions about copay amounts after talking to insurance. CSW met with daughter again and answered questions. Insurance pending at this time. CSW to follow.    Expected Discharge Plan: Skilled Nursing Facility Barriers to Discharge: Continued Medical Work up, English as a second language teacher  Expected Discharge Plan and Services     Post Acute Care Choice: Skilled Nursing Facility Living arrangements for the past 2 months: Single Family Home, Skilled Nursing Facility                                       Social Determinants of Health (SDOH) Interventions SDOH Screenings   Food Insecurity: No Food Insecurity (06/08/2023)  Housing: Low Risk  (06/08/2023)  Transportation Needs: No Transportation Needs (06/08/2023)  Utilities: Not At Risk (06/08/2023)  Alcohol Screen: Low Risk  (02/01/2023)  Depression (PHQ2-9): Low Risk  (05/17/2023)  Financial Resource Strain: Low Risk  (02/01/2023)  Physical Activity: Insufficiently Active (02/01/2023)  Social Connections: Moderately Isolated (02/01/2023)  Stress: No Stress Concern Present (02/01/2023)  Tobacco Use: Low Risk  (06/06/2023)    Readmission Risk Interventions     No data to display

## 2023-06-08 NOTE — Hospital Course (Addendum)
89 yof with medical history significant of dementia, prior CVA in 2017, hypertension, hyperlipidemia, bilateral carotid artery stenosis, hypothyroidism, stage III CKD, GERD, aortic atherosclerosis, and recent hospitalization in May 2024 and found to have a small acute infarct in the right corona radiata presented after fall, unwitnessed, at night at home where she was witnessed to not be moving her left side. Patient was seen as a code stroke and admitted for further management. Seen by neurology underwent extensive workup found to have  Acute right CR and parietal periventricular WM ischemic infarcts , compared to CT head, CTA head- Right MCA branch region calcification is chronic  and neck,-no LVO, Proximal R ICA stenosis measures 50% on coronal reformats   CT perfusion, 2D echo with EF 60 to 65%, G1 DD EEG-mild diffuse encephalopathy, LDL 78 HbA1c 6.0. Neurology advised aspirin 81 mg plus Brilinta 90 mg x 30 days then Plavix 75 mg daily as monotherapy.  PTA on aspirin 81 and Plavix 75.  No high intensity statin given advanced age and LDL not far from and to continue Zocor 20 at discharge. Other issues>history of previous stroke, hypertension not on home meds, hyperlipidemia CKD stage III yea chronic UTI on Macrobid, memory loss with questionable vascular dementia Patient has been stabilized,awaiting for skilled nursing facility

## 2023-06-08 NOTE — Progress Notes (Signed)
EOS:  At start of shift, pt's family member excited room and stated to staff that pt was "shaking like she did before when she had her seizure before the stroke." This RN entered room, uncovered pt from blankets and discovered pt was furiously scratching her chest - pt had bleeding abrasion in center of chest. Pt cleaned up, brief changed, bed changed. Mittens placed on pt.

## 2023-06-09 DIAGNOSIS — R2990 Unspecified symptoms and signs involving the nervous system: Secondary | ICD-10-CM | POA: Diagnosis not present

## 2023-06-09 LAB — CBC
HCT: 39.8 % (ref 36.0–46.0)
Hemoglobin: 13.2 g/dL (ref 12.0–15.0)
MCH: 31.8 pg (ref 26.0–34.0)
MCHC: 33.2 g/dL (ref 30.0–36.0)
MCV: 95.9 fL (ref 80.0–100.0)
Platelets: 287 10*3/uL (ref 150–400)
RBC: 4.15 MIL/uL (ref 3.87–5.11)
RDW: 14 % (ref 11.5–15.5)
WBC: 13.5 10*3/uL — ABNORMAL HIGH (ref 4.0–10.5)
nRBC: 0 % (ref 0.0–0.2)

## 2023-06-09 LAB — GLUCOSE, CAPILLARY
Glucose-Capillary: 103 mg/dL — ABNORMAL HIGH (ref 70–99)
Glucose-Capillary: 103 mg/dL — ABNORMAL HIGH (ref 70–99)
Glucose-Capillary: 111 mg/dL — ABNORMAL HIGH (ref 70–99)
Glucose-Capillary: 120 mg/dL — ABNORMAL HIGH (ref 70–99)
Glucose-Capillary: 125 mg/dL — ABNORMAL HIGH (ref 70–99)

## 2023-06-09 LAB — BASIC METABOLIC PANEL
Anion gap: 10 (ref 5–15)
BUN: 10 mg/dL (ref 8–23)
CO2: 22 mmol/L (ref 22–32)
Calcium: 8.9 mg/dL (ref 8.9–10.3)
Chloride: 102 mmol/L (ref 98–111)
Creatinine, Ser: 1 mg/dL (ref 0.44–1.00)
GFR, Estimated: 54 mL/min — ABNORMAL LOW (ref >60)
Glucose, Bld: 116 mg/dL — ABNORMAL HIGH (ref 70–99)
Potassium: 3.8 mmol/L (ref 3.5–5.1)
Sodium: 134 mmol/L — ABNORMAL LOW (ref 135–145)

## 2023-06-09 MED ORDER — GABAPENTIN 100 MG PO CAPS: 200.0000 mg | ORAL_CAPSULE | Freq: Every day | ORAL | Status: DC

## 2023-06-09 MED ORDER — PANTOPRAZOLE SODIUM 40 MG PO TBEC: 40.0000 mg | DELAYED_RELEASE_TABLET | Freq: Every day | ORAL | Status: DC

## 2023-06-09 MED ORDER — TICAGRELOR 90 MG PO TABS: 90.0000 mg | ORAL_TABLET | Freq: Two times a day (BID) | ORAL | Status: AC

## 2023-06-09 NOTE — Plan of Care (Signed)
  Problem: Ischemic Stroke/TIA Tissue Perfusion: Goal: Complications of ischemic stroke/TIA will be minimized Outcome: Progressing   Problem: Nutrition: Goal: Risk of aspiration will decrease Outcome: Progressing Goal: Dietary intake will improve Outcome: Progressing   Problem: Education: Goal: Knowledge of General Education information will improve Description: Including pain rating scale, medication(s)/side effects and non-pharmacologic comfort measures Outcome: Progressing

## 2023-06-09 NOTE — Progress Notes (Signed)
Physical Therapy Treatment Patient Details Name: Sarah Freeman MRN: [id number] DOB: Oct 01, 1931 Today's Date: 06/09/2023   History of Present Illness Pt is a 89 y/o female presenting after a fall at home and L sided weakness. MRI brain showed acute infarct in deep R cerebral white matter impacting external capsule and corona radiata, as well as small R frontal and parietal white matter infarcts. PMH: dementia, prior CVA in 2017 and May 2024, hypertension, hyperlipidemia, bilateral carotid artery stenosis, hypothyroidism, stage III CKD, GERD, aortic atherosclerosis    PT Comments  Pt agreeable to treatment today, not hostile but was somewhat sleepy. Daughter present. Pt able to use LLE but has inattention to that side, frequent cues needed to use LLE with transfers. Pt came to EOB with max A +2 and stood within stedy. Pt transferred bed to [facility] for toileling. Had difficulty maintaining standing for clean up. Then transferred [facility] to recliner. Daughter encouraged to see pt OOB. Pt following basic commands today. Patient will benefit from continued inpatient follow up therapy, <3 hours/day. PT will continue to follow.     Assistance Recommended at Discharge Frequent or constant Supervision/Assistance  If plan is discharge home, recommend the following:  Can travel by private vehicle    A lot of help with walking and/or transfers;A lot of help with bathing/dressing/bathroom;Assist for transportation;Help with stairs or ramp for entrance   No  Equipment Recommendations  None recommended by PT    Recommendations for Other Services       Precautions / Restrictions Precautions Precautions: Fall;Other (comment) Precaution Comments: incontinence, L inattn Restrictions Weight Bearing Restrictions: No     Mobility  Bed Mobility Overal bed mobility: Needs Assistance Bed Mobility: Supine to Sit     Supine to sit: Max assist, +2 for physical assistance, HOB elevated     General bed  mobility comments: pt assisting with RUE and sliding RLE when cued. otherwise max A    Transfers Overall transfer level: Needs assistance Equipment used: Ambulation equipment used Transfers: Sit to/from Stand, Bed to chair/wheelchair/BSC Sit to Stand: Max assist, +2 physical assistance, +2 safety/equipment, From elevated surface           General transfer comment: assist needed for power up as well as support for LUE. Pt needed cueing to push with LLE. Pivoted to [facility] on stedy with urination and bowel mvmt. Then pivoted BSC to recliner. Lift pad in recliner for return to bed Transfer via Lift Equipment: Stedy  Ambulation/Gait               General Gait Details: unable to step feet in standing   Stairs             Wheelchair Mobility     Tilt Bed    Modified Rankin (Stroke Patients Only) Modified Rankin (Stroke Patients Only) Pre-Morbid Rankin Score: Moderate disability Modified Rankin: Severe disability     Balance Overall balance assessment: Needs assistance Sitting-balance support: Feet supported, No upper extremity supported Sitting balance-Leahy Scale: Poor Sitting balance - Comments: L lean Postural control: Left lateral lean Standing balance support: Bilateral upper extremity supported, During functional activity Standing balance-Leahy Scale: Zero Standing balance comment: max A +2 to stand and extend through trunk                            Cognition Arousal/Alertness: Lethargic Behavior During Therapy: Flat affect Overall Cognitive Status: Impaired/Different from baseline (history of dementia but now more sleepy)  Area of Impairment: Orientation, Attention, Following commands, Memory, Safety/judgement, Awareness, Problem solving                 Orientation Level: Disoriented to, Place, Time, Situation Current Attention Level: Sustained Memory: Decreased short-term memory Following Commands: Follows one step commands  inconsistently, Follows one step commands with increased time Safety/Judgement: Decreased awareness of safety, Decreased awareness of deficits Awareness: Intellectual Problem Solving: Difficulty sequencing, Requires verbal cues, Requires tactile cues General Comments: follows basic command. Inattention to L side but can turn face and eyes to L when cued. Instructed daughter in standing on L and increasing attention to L side        Exercises General Exercises - Lower Extremity Ankle Circles/Pumps: AROM, Both, 10 reps, Seated Long Arc Quad: AROM, Left, 10 reps, Seated    General Comments        Pertinent Vitals/Pain Pain Assessment Pain Assessment: Faces Faces Pain Scale: Hurts little more Pain Location: LUE, elbow to hand Pain Descriptors / Indicators: Sore Pain Intervention(s): Limited activity within patient's tolerance, Monitored during session    Home Living                          Prior Function            PT Goals (current goals can now be found in the care plan section) Acute Rehab PT Goals Patient Stated Goal: unable to state PT Goal Formulation: With patient/family Time For Goal Achievement: 06/21/23 Potential to Achieve Goals: Good Progress towards PT goals: Progressing toward goals    Frequency    Min 3X/week      PT Plan Current plan remains appropriate    Co-evaluation              AM-PAC PT "6 Clicks" Mobility   Outcome Measure  Help needed turning from your back to your side while in a flat bed without using bedrails?: A Lot Help needed moving from lying on your back to sitting on the side of a flat bed without using bedrails?: A Lot Help needed moving to and from a bed to a chair (including a wheelchair)?: A Lot Help needed standing up from a chair using your arms (e.g., wheelchair or bedside chair)?: A Lot Help needed to walk in hospital room?: Total Help needed climbing 3-5 steps with a railing? : Total 6 Click Score:  10    End of Session Equipment Utilized During Treatment: Gait belt Activity Tolerance: Treatment limited secondary to agitation Patient left: with call bell/phone within reach;with family/visitor present;in chair;with chair alarm set Nurse Communication: Mobility status;Need for lift equipment PT Visit Diagnosis: Unsteadiness on feet (R26.81);Muscle weakness (generalized) (M62.81);Hemiplegia and hemiparesis Hemiplegia - Right/Left: Left Hemiplegia - dominant/non-dominant: Non-dominant Hemiplegia - caused by: Cerebral infarction     Time: 0927-0956 PT Time Calculation (min) (ACUTE ONLY): 29 min  Charges:    $Therapeutic Activity: 23-37 mins PT General Charges $$ ACUTE PT VISIT: 1 Visit                     Lyanne Co, PT  Acute Rehab Services Secure chat preferred Office [phone]    Lawana Chambers Andy Allende 06/09/2023, 12:16 PM

## 2023-06-09 NOTE — Discharge Summary (Signed)
Physician Discharge Summary  Sarah Freeman ZOX:[id number] DOB: Oct 09, 1931 DOA: 06/06/2023  PCP: Dorcas Carrow, DO  Admit date: 06/06/2023 Discharge date: 06/09/2023 Recommendations for Outpatient Follow-up:  Follow up with PCP in 1 weeks-call for appointment Please obtain BMP/CBC in one week Follow-up with neurology as outpatient  Discharge Dispo: SNF Discharge Condition: Stable Code Status:   Code Status: DNR Diet recommendation:  Diet Order             DIET - DYS 1 Room service appropriate? Yes with Assist; Fluid consistency: Thin  Diet effective now                    Brief/Interim Summary: 89 yof with medical history significant of dementia, prior CVA in 2017, hypertension, hyperlipidemia, bilateral carotid artery stenosis, hypothyroidism, stage III CKD, GERD, aortic atherosclerosis, and recent hospitalization in May 2024 and found to have a small acute infarct in the right corona radiata presented after fall, unwitnessed, at night at home where she was witnessed to not be moving her left side. Patient was seen as a code stroke and admitted for further management. Seen by neurology underwent extensive workup found to have  Acute right CR and parietal periventricular WM ischemic infarcts , compared to CT head, CTA head- Right MCA branch region calcification is chronic  and neck,-no LVO, Proximal R ICA stenosis measures 50% on coronal reformats   CT perfusion, 2D echo with EF 60 to 65%, G1 DD EEG-mild diffuse encephalopathy, LDL 78 HbA1c 6.0. Neurology advised aspirin 81 mg plus Brilinta 90 mg x 30 days then Plavix 75 mg daily as monotherapy.  PTA on aspirin 81 and Plavix 75.  No high intensity statin given advanced age and LDL not far from and to continue Zocor 20 at discharge. Other issues>history of previous stroke, hypertension not on home meds, hyperlipidemia CKD stage III yea chronic UTI on Macrobid, memory loss with questionable vascular dementia Patient has been  stabilized,awaiting for skilled nursing facility   Discharge Diagnoses:  Principal Problem:   Stroke-like symptoms Active Problems:   Hypothyroidism   Statin intolerance   Chronic UTI   Chronic kidney disease, stage 3 (HCC)   Dyslipidemia   Acute right CR and parietal periventricular WM ischemic infarcts w/ left-sided weakness 01/2016:left-sided numbness and weakness- MRI with right cortical and subcortical small infarct 12/2017 with a few small acute infarct on the right parietal deep white matter-had loop recorder for 2 years no A-fib found S/P extensive workup found to have acute right TCR/parietal periventricular ischemia with dense left weakness  compared to CT head, CTA head- Right MCA branch region calcification is chronic  and neck,-no LVO, Proximal R ICA stenosis measures 50% on coronal reformats Recent 2D echo with EF 60 to 65%, G1 DD; EEG-mild diffuse encephalopathy-no seizure, she has had some twitching of the right arm  LDL 78 HbA1c 6.0. Given recent smaller stroke with recurrence now in the same area but larger while on Plavix and aspirin neurology recommending further evaluation for study for factor XI inhibitor ([place] trial)> but patient family declined PTA on aspirin 81 and Plavix 75. Neurology advised aspirin 81 mg plus Brilinta 90 mg x 30 days then Plavix 75 mg daily as monotherapy. No high intensity statin given advanced age and LDL not far from and to continue Zocor 20 at discharge. Again seen by neurology this morning due to daughter's questions of right arm shaking and cleared for discharge  Hypertension: BP fairly controlled without medication.  Chronic Kidney Disease, Stage 3a Creatinine remains stable, encourage oral intake Recent Labs    01/04/23 0000 04/06/23 1136 04/07/23 0600 04/08/23 0607 04/12/23 0641 04/14/23 0555 04/16/23 0419 04/17/23 0506 04/18/23 0530 04/28/23 0000 05/17/23 1524 06/06/23 0447 06/06/23 0450  BUN 10 10 10 11   --   --   12 13 13 12  8* 9 9  CREATININE 0.90 0.96 0.84 0.94 0.94 0.87 0.92 0.78 0.84 0.9 0.95 0.97 1.00  CO2 23 24 24 23   --   --  24 24 25  25* 24 25  --     Chronic UTI: On Macrobid   Memory Loss ?  Vascular dementia in the setting of strokes and small vessel disease-PTA on Aricept previously able to perform all ADLs she is now limited to simple tasks including feeding herself, ambulate independently but denies patient has been able to close or bathe herself independently for some time. Continue supportive care fall precaution  Hypothyroidism: Continue home levothyroxine  Obesity:Patient's Body mass index is 38.26 kg/m. : Will benefit with PCP follow-up, weight loss  healthy lifestyle and outpatient sleep evaluation.  Goals of care: Confirmed DNR status, overall prognosis does not.  Bright if patient fails to improve despite rehabilitation will benefit with palliative care evaluation/hospice evaluation, informed son.  Patient has desired no feeding tube and no resuscitative effort as per son  Consults: Neurology Subjective: Alert awake, family at the bedside, able to tell me her name  Discharge Exam: Vitals:   06/09/23 0750 06/09/23 1100  BP: 136/78 (P) 117/87  Pulse: 81 (P) 100  Resp: 18 (P) 18  Temp: 98.4 F (36.9 C) (P) 98.6 F (37 C)  SpO2: 95% (P) 99%   General: Pt is alert, awake, not in acute distress Cardiovascular: RRR, S1/S2 +, no rubs, no gallops Respiratory: CTA bilaterally, no wheezing, no rhonchi Abdominal: Soft, NT, ND, bowel sounds + Extremities: no edema, no cyanosis  Discharge Instructions  Discharge Instructions     Ambulatory referral to Neurology   Complete by: As directed    Follow up with Dr. Malvin Johns in 4 weeks. Pt is Dr. Daisy Blossom pt. Thanks   Discharge instructions   Complete by: As directed    continue aspirin 81 mg plus Brilinta 90 mg x 30 days then Plavix 75 mg daily as monotherapy  Please call call MD or return to ER for similar or worsening  recurring problem that brought you to hospital or if any fever,nausea/vomiting,abdominal pain, uncontrolled pain, chest pain,  shortness of breath or any other alarming symptoms.  Please follow-up your doctor as instructed in a week time and call the office for appointment.  Please avoid alcohol, smoking, or any other illicit substance and maintain healthy habits including taking your regular medications as prescribed.  You were cared for by a hospitalist during your hospital stay. If you have any questions about your discharge medications or the care you received while you were in the hospital after you are discharged, you can call the unit and ask to speak with the hospitalist on call if the hospitalist that took care of you is not available.  Once you are discharged, your primary care physician will handle any further medical issues. Please note that NO REFILLS for any discharge medications will be authorized once you are discharged, as it is imperative that you return to your primary care physician (or establish a relationship with a primary care physician if you do not have one) for your aftercare needs so that they can  reassess your need for medications and monitor your lab values   Increase activity slowly   Complete by: As directed       Allergies as of 06/09/2023       Reactions   Statins Other (See Comments)   Leg cramps   Bacitracin Other (See Comments)   Doesn't help wound to heal   Neomycin-polymyxin-gramicidin Other (See Comments)   Pt states wounds do not heal with neosporin        Medication List     TAKE these medications    aspirin EC 81 MG tablet Take 81 mg by mouth daily. Swallow whole.   cholecalciferol 25 MCG (1000 UNIT) tablet Commonly known as: VITAMIN D3 Take 1,000 Units by mouth daily.   cyanocobalamin 1000 MCG tablet Take 1 tablet (1,000 mcg total) by mouth daily.   donepezil 5 MG tablet Commonly known as: ARICEPT Take 1 tablet (5 mg total) by  mouth at bedtime. What changed: when to take this   gabapentin 100 MG capsule Commonly known as: NEURONTIN Take 2 capsules (200 mg total) by mouth daily. Start taking on: June 10, 2023 What changed:  how much to take how to take this when to take this additional instructions   levothyroxine 50 MCG tablet Commonly known as: SYNTHROID Take 1 tablet (50 mcg total) by mouth daily before breakfast.   nitrofurantoin (macrocrystal-monohydrate) 100 MG capsule Commonly known as: Macrobid Take 1 capsule (100 mg total) by mouth at bedtime.   pantoprazole 40 MG tablet Commonly known as: PROTONIX Take 1 tablet (40 mg total) by mouth daily.   polyethylene glycol 17 g packet Commonly known as: MIRALAX / GLYCOLAX Take 17 g by mouth daily.   Prolia 60 MG/ML Sosy injection Generic drug: denosumab Inject 60 mg into the skin every 6 (six) months.   QUEtiapine 25 MG tablet Commonly known as: SEROQUEL Take 0.5-1 tablets (12.5-25 mg total) by mouth every evening. What changed: how much to take   simvastatin 20 MG tablet Commonly known as: ZOCOR Take 1 tablet (20 mg total) by mouth daily at 6 PM. What changed: when to take this   ticagrelor 90 MG Tabs tablet Commonly known as: BRILINTA Take 1 tablet (90 mg total) by mouth 2 (two) times daily. continue aspirin 81 mg plus Brilinta 90 mg x 30 days then Plavix 75 mg daily as monotherapy        Follow-up Information     Johnson, Megan P, DO .   Specialty: Family Medicine Contact information: [address] [place] [address] [phone]         Elvin So C, PA-C Follow up on 08/29/2023.   Why: as scheduled Contact information: [address] [place] [address] [phone]         Morene Crocker, MD. Schedule an appointment as soon as possible for a visit in 1 month(s).   Specialty: Neurology Contact information: [phone] [facility] [place]  [address] [phone]                Allergies  Allergen Reactions   Statins Other (See Comments)    Leg cramps    Bacitracin Other (See Comments)    Doesn't help wound to heal   Neomycin-Polymyxin-Gramicidin Other (See Comments)    Pt states wounds do not heal with neosporin     The results of significant diagnostics from this hospitalization (including imaging, microbiology, ancillary and laboratory) are listed below for reference.  Microbiology: No results found for this or any previous visit (from the past 240 hour(s)).  Procedures/Studies: EEG adult  Result Date: June 11, 2023 Charlsie Quest, MD     2023-06-11  3:54 PM Patient Name: Sarah Freeman MRN: [id number] Epilepsy Attending: Charlsie Quest Referring Physician/Provider: Caryl Pina, MD Date: June 11, 2023 Duration: 22.43 mins Patient history: 89yo F left sided weakness and R gaze deviation. EEG to evaluate for seizure Level of alertness: Awake AEDs during EEG study: GBP Technical aspects: This EEG study was done with scalp electrodes positioned according to the 10-20 International system of electrode placement. Electrical activity was reviewed with band pass filter of 1-70Hz , sensitivity of 7 uV/mm, display speed of 5mm/sec with a 60Hz  notched filter applied as appropriate. EEG data were recorded continuously and digitally stored.  Video monitoring was available and reviewed as appropriate. Description: The posterior dominant rhythm consists of 7.5 Hz activity of moderate voltage (25-35 uV) seen predominantly in posterior head regions, symmetric and reactive to eye opening and eye closing. EEG showed intermittent generalized 3 to 6 Hz theta-delta slowing. Hyperventilation and photic stimulation were not performed.   ABNORMALITY - Intermittent slow, generalized IMPRESSION: This study is suggestive of mild diffuse encephalopathy, nonspecific etiology. No seizures or epileptiform discharges were seen throughout the recording.  Charlsie Quest   DG Swallowing Func-Speech Pathology  Result Date: June 11, 2023 Table formatting from the original result was not included. Modified Barium Swallow Study Patient Details Name: Sarah Freeman MRN: [id number] Date of Birth: [birth year] Today's Date: 06-11-23 HPI/PMH: HPI: Sarah Freeman is a 89 y.o. female who [facility] EMS as a Code Stroke with left sided weakness and R gaze deviation, after getting up to go to the bathroom in the middle of the night and falling.  MRI 7/8: "Acute infarct in the deep right cerebral white matter affecting  external capsule and corona radiata. Small separate right frontal  and parietal white matter infarcts."  Pt with medical history significant of dementia, prior CVA in 2017, hypertension, hyperlipidemia, bilateral carotid artery stenosis, hypothyroidism, stage III CKD, GERD, aortic atherosclerosis, and recent hospitalization in May 2024 and found to have a small acute infarct in the right corona radiata Clinical Impression: Clinical Impression: Pt presents with an overall functional oropharyngeal swallow. Generalized oral weakness and acute L sided asymmetry contribute to instances of anterior spillage and trace residue, which lines the tongue and eventually clears with subsequent swallows. Pt had one instance of sensed aspiration during the initial teaspoonful of thin liquids that was independently ejected with a cough (PAS 6) in which the bolus spilled over the arytenoids during the swallow prior to complete closure of the laryngeal vestibule. No other instances of penetration or aspiration occurred with subsequent challenging sips of thin liquids, nectar thick liquids, honey thick liquids, or purees. When cued to take a bite of the graham cracker with purees, pt only took the puree; therefore solids were unable to be tested today. The pill was administered with thin liquids via straw and passed without difficulty. Pt may benefit from continued SLP  f/u to address potential to upgrade diet and target functional compensatory strategies. Recommend initiating diet of Dys 1 textures with thin liquids and meds given whole with liquids as mentation allows (or crushed in puree PRN). Recommend full superivison during meal times. Factors that may increase risk of adverse event in presence of aspiration [name]): Factors that may increase risk of adverse event in presence of aspiration Rubye Oaks &  Clearance Coots 2021): Poor general health and/or compromised immunity; Reduced cognitive function; Limited mobility; Frail or deconditioned Recommendations/Plan: Swallowing Evaluation Recommendations Swallowing Evaluation Recommendations Recommendations: PO diet PO Diet Recommendation: Dysphagia 1 (Pureed); Thin liquids (Level 0) Liquid Administration via: Straw; Cup Medication Administration: Whole meds with liquid Supervision: Full supervision/cueing for swallowing strategies Swallowing strategies  : Minimize environmental distractions; Slow rate; Small bites/sips; Check for anterior loss Postural changes: Position pt fully upright for meals Oral care recommendations: Oral care BID (2x/day) Treatment Plan Treatment Plan Treatment recommendations: Therapy as outlined in treatment plan below Follow-up recommendations: Skilled nursing-short term rehab (<3 hours/day) Functional status assessment: Patient has had a recent decline in their functional status and demonstrates the ability to make significant improvements in function in a reasonable and predictable amount of time. Treatment frequency: Min 2x/week Treatment duration: 2 weeks Interventions: Aspiration precaution training; Compensatory techniques; Patient/family education; Trials of upgraded texture/liquids; Diet toleration management by SLP Recommendations Recommendations for follow up therapy are one component of a multi-disciplinary discharge planning process, led by the attending physician.  Recommendations may  be updated based on patient status, additional functional criteria and insurance authorization. Assessment: Orofacial Exam: Orofacial Exam Oral Cavity: Oral Hygiene: WFL Oral Cavity - Dentition: Dentures, top; Dentures, bottom Orofacial Anatomy: Other (comment) (L sided assymetry) Oral Motor/Sensory Function: Generalized oral weakness Anatomy: Anatomy: Suspected cervical osteophytes Boluses Administered: Boluses Administered Boluses Administered: Thin liquids (Level 0); Mildly thick liquids (Level 2, nectar thick); Moderately thick liquids (Level 3, honey thick); Puree  Oral Impairment Domain: Oral Impairment Domain Lip Closure: Escape progressing to mid-chin Tongue control during bolus hold: Not tested Bolus preparation/mastication: -- (unable to test solids) Bolus transport/lingual motion: Brisk tongue motion Oral residue: Trace residue lining oral structures Location of oral residue : Tongue Initiation of pharyngeal swallow : Pyriform sinuses  Pharyngeal Impairment Domain: Pharyngeal Impairment Domain Soft palate elevation: No bolus between soft palate (SP)/pharyngeal wall (PW) Laryngeal elevation: Complete superior movement of thyroid cartilage with complete approximation of arytenoids to epiglottic petiole Anterior hyoid excursion: Partial anterior movement Epiglottic movement: Partial inversion Laryngeal vestibule closure: Incomplete, narrow column air/contrast in laryngeal vestibule Pharyngeal stripping wave : Present - diminished Pharyngeal contraction (A/P view only): N/A Pharyngoesophageal segment opening: Complete distension and complete duration, no obstruction of flow Tongue base retraction: Trace column of contrast or air between tongue base and PPW Pharyngeal residue: Complete pharyngeal clearance Location of pharyngeal residue: N/A  Esophageal Impairment Domain: Esophageal Impairment Domain Esophageal clearance upright position: Complete clearance, esophageal coating Pill: Pill Consistency  administered: Thin liquids (Level 0) Thin liquids (Level 0): [facility] Penetration/Aspiration Scale Score: Penetration/Aspiration Scale Score 6.  Material enters airway, passes BELOW cords then ejected out: Thin liquids (Level 0) Compensatory Strategies: Compensatory Strategies Compensatory strategies: Yes Straw: Effective   General Information: Caregiver present: Yes  Diet Prior to this Study: NPO   Temperature : Normal   Respiratory Status: WFL   Supplemental O2: None (Room air)   History of Recent Intubation: No  Behavior/Cognition: Alert; Cooperative; Requires cueing Self-Feeding Abilities: Needs assist with self-feeding Baseline vocal quality/speech: Normal Volitional Cough: Able to elicit Volitional Swallow: Able to elicit Exam Limitations: No limitations Goal Planning: Prognosis for improved oropharyngeal function: Good Barriers to Reach Goals: Cognitive deficits No data recorded Patient/Family Stated Goal: wants potatoes Consulted and agree with results and recommendations: Patient; Family member/caregiver Pain: Pain Assessment Pain Assessment: Faces Faces Pain Scale: 0 End of Session: Start Time:SLP Start Time (ACUTE ONLY): 1343 Stop Time: SLP Stop Time (ACUTE ONLY): 1401  Time Calculation:SLP Time Calculation (min) (ACUTE ONLY): 18 min Charges: SLP Evaluations $ SLP Speech Visit: 1 Visit SLP Evaluations $BSS Swallow: 1 Procedure $MBS Swallow: 1 Procedure SLP visit diagnosis: SLP Visit Diagnosis: Dysphagia, unspecified (R13.10) Past Medical History: Past Medical History: Diagnosis Date  Hyperlipidemia   Hypothyroid   Stroke [facility])   Urinary incontinence  Past Surgical History: Past Surgical History: Procedure Laterality Date  BREAST BIOPSY Left 2000?  neg. dr. Rutherford Nail office  CHOLECYSTECTOMY  1964  COLONOSCOPY    Dr Maryruth Bun  DILATION AND CURETTAGE OF UTERUS    EYE SURGERY    LOOP RECORDER INSERTION N/A 02/09/2018  Procedure: LOOP RECORDER INSERTION;  Surgeon: Duke Salvia, MD;  Location: [facility] INVASIVE CV LAB;   Service: Cardiovascular;  Laterality: N/A;  SKIN CANCER EXCISION    face  TEE WITHOUT CARDIOVERSION N/A 02/09/2018  Procedure: TRANSESOPHAGEAL ECHOCARDIOGRAM (TEE);  Surgeon: Antonieta Iba, MD;  Location: [facility] ORS;  Service: Cardiovascular;  Laterality: N/A;  TOTAL ABDOMINAL HYSTERECTOMY W/ BILATERAL SALPINGOOPHORECTOMY    age 48 Gwynneth Aliment, M.A., CF-SLP Speech Language Pathology, Acute Rehabilitation Services Secure Chat preferred [phone] 06/06/2023, 3:05 PM  MR BRAIN WO CONTRAST  Result Date: 06/06/2023 CLINICAL DATA:  Neuro deficit with acute stroke suspected EXAM: MRI HEAD WITHOUT CONTRAST TECHNIQUE: Multiplanar, multiecho pulse sequences of the brain and surrounding structures were obtained without intravenous contrast. COMPARISON:  Head CT and CTA from earlier today. Brain MRI 04/06/2023 FINDINGS: Brain: Confluent restricted diffusion in the right corona radiata and external capsule. Separate area of small restricted diffusion in the right superior frontal and parietal white matter. Background of extensive chronic small vessel ischemia. Small chronic right cerebral cortex infarcts. No hemorrhage, hydrocephalus, or collection. Vascular: Major flow voids are preserved.  There was preceding CTA. Skull and upper cervical spine: Normal marrow signal Sinuses/Orbits: Bilateral cataract resection. Partial right mastoid opacification. IMPRESSION: 1. Acute infarct in the deep right cerebral white matter affecting external capsule and corona radiata. Small separate right frontal and parietal white matter infarcts. 2. Extensive chronic small vessel disease and small chronic right frontal cortex infarcts. Electronically Signed   By: Tiburcio Pea M.D.   On: 06/06/2023 08:03   CT ANGIO HEAD NECK W WO CM (CODE STROKE)  Result Date: 06/06/2023 CLINICAL DATA:  Code stroke.  Left-sided weakness EXAM: CT ANGIOGRAPHY HEAD AND NECK CT PERFUSION BRAIN TECHNIQUE: Multidetector CT imaging of the head and neck was  performed using the standard protocol during bolus administration of intravenous contrast. Multiplanar CT image reconstructions and MIPs were obtained to evaluate the vascular anatomy. Carotid stenosis measurements (when applicable) are obtained utilizing NASCET criteria, using the distal internal carotid diameter as the denominator. Multiphase CT imaging of the brain was performed following IV bolus contrast injection. Subsequent parametric perfusion maps were calculated using RAPID software. RADIATION DOSE REDUCTION: This exam was performed according to the departmental dose-optimization program which includes automated exposure control, adjustment of the mA and/or kV according to patient size and/or use of iterative reconstruction technique. CONTRAST:  OMNIPAQUE IOHEXOL 350 MG/ML SOLN COMPARISON:  MRA of the head and neck 04/06/2023 FINDINGS: CTA NECK FINDINGS Aortic arch: Atheromatous plaque with 3 vessel branching. No acute finding or dilatation Right carotid system: Mixed density plaque primarily at the bifurcation. Proximal ICA stenosis measures 50% on coronal reformats. No ulceration or dissection. Left carotid system: Calcified plaque at the bifurcation without stenosis or ulceration. Vertebral arteries: Proximal subclavian atherosclerosis without flow reducing stenosis. The left vertebral artery is dominant the smoothly  contoured, and widely patent. Skeleton: Generalized cervical spine degeneration with multilevel anterolisthesis. Other neck: No acute finding Upper chest: No acute finding Review of the MIP images confirms the above findings CTA HEAD FINDINGS Anterior circulation: Atheromatous calcification of the carotid siphons. No branch occlusion, beading, or aneurysm. No proximal flow limiting stenosis. Posterior circulation: Left dominant vertebral artery. The vertebral and basilar arteries are widely patent. High-grade narrowing at the left PCA bifurcation although no major branch occlusion. No  beading or aneurysm Venous sinuses: Unremarkable for the arterial phase Anatomic variants: None significant Review of the MIP images confirms the above findings CT Brain Perfusion Findings: ASPECTS: 10 CBF (<30%) Volume: 0mL Perfusion (Tmax>6.0s) volume: 0mL With very strict time parameters there is relatively delayed perfusion in the right MCA upper division and watershed territories. IMPRESSION: 1. No emergent large vessel occlusion. 2. No infarct or ischemia by standard threshold CT perfusion. With very strict time parameters there is relatively delayed perfusion to the upper division right MCA territory and watershed which may be related to a 50-55% right ICA origin stenosis. 3. High-grade left PCA branch narrowing. Electronically Signed   By: Tiburcio Pea M.D.   On: 06/06/2023 05:27   CT CEREBRAL PERFUSION W CONTRAST  Result Date: 06/06/2023 CLINICAL DATA:  Code stroke.  Left-sided weakness EXAM: CT ANGIOGRAPHY HEAD AND NECK CT PERFUSION BRAIN TECHNIQUE: Multidetector CT imaging of the head and neck was performed using the standard protocol during bolus administration of intravenous contrast. Multiplanar CT image reconstructions and MIPs were obtained to evaluate the vascular anatomy. Carotid stenosis measurements (when applicable) are obtained utilizing NASCET criteria, using the distal internal carotid diameter as the denominator. Multiphase CT imaging of the brain was performed following IV bolus contrast injection. Subsequent parametric perfusion maps were calculated using RAPID software. RADIATION DOSE REDUCTION: This exam was performed according to the departmental dose-optimization program which includes automated exposure control, adjustment of the mA and/or kV according to patient size and/or use of iterative reconstruction technique. CONTRAST:  OMNIPAQUE IOHEXOL 350 MG/ML SOLN COMPARISON:  MRA of the head and neck 04/06/2023 FINDINGS: CTA NECK FINDINGS Aortic arch: Atheromatous plaque with  3 vessel branching. No acute finding or dilatation Right carotid system: Mixed density plaque primarily at the bifurcation. Proximal ICA stenosis measures 50% on coronal reformats. No ulceration or dissection. Left carotid system: Calcified plaque at the bifurcation without stenosis or ulceration. Vertebral arteries: Proximal subclavian atherosclerosis without flow reducing stenosis. The left vertebral artery is dominant the smoothly contoured, and widely patent. Skeleton: Generalized cervical spine degeneration with multilevel anterolisthesis. Other neck: No acute finding Upper chest: No acute finding Review of the MIP images confirms the above findings CTA HEAD FINDINGS Anterior circulation: Atheromatous calcification of the carotid siphons. No branch occlusion, beading, or aneurysm. No proximal flow limiting stenosis. Posterior circulation: Left dominant vertebral artery. The vertebral and basilar arteries are widely patent. High-grade narrowing at the left PCA bifurcation although no major branch occlusion. No beading or aneurysm Venous sinuses: Unremarkable for the arterial phase Anatomic variants: None significant Review of the MIP images confirms the above findings CT Brain Perfusion Findings: ASPECTS: 10 CBF (<30%) Volume: 0mL Perfusion (Tmax>6.0s) volume: 0mL With very strict time parameters there is relatively delayed perfusion in the right MCA upper division and watershed territories. IMPRESSION: 1. No emergent large vessel occlusion. 2. No infarct or ischemia by standard threshold CT perfusion. With very strict time parameters there is relatively delayed perfusion to the upper division right MCA territory and watershed which  may be related to a 50-55% right ICA origin stenosis. 3. High-grade left PCA branch narrowing. Electronically Signed   By: Tiburcio Pea M.D.   On: 06/06/2023 05:27   CT HEAD CODE STROKE WO CONTRAST  Result Date: 06/06/2023 CLINICAL DATA:  Code stroke.  Left-sided weakness EXAM:  CT HEAD WITHOUT CONTRAST TECHNIQUE: Contiguous axial images were obtained from the base of the skull through the vertex without intravenous contrast. RADIATION DOSE REDUCTION: This exam was performed according to the departmental dose-optimization program which includes automated exposure control, adjustment of the mA and/or kV according to patient size and/or use of iterative reconstruction technique. COMPARISON:  Head CT 04/06/2023 FINDINGS: Brain: No evidence of acute infarction, hemorrhage, hydrocephalus, extra-axial collection or mass lesion/mass effect. Small patchy areas of chronic cortically based infarct along the superior right frontal lobe best seen on sagittal reformats. Generalized cerebral volume loss and chronic small vessel ischemia. Vascular: No hyperdense vessel or unexpected calcification. Right MCA branch region calcification is chronic. Skull: Normal. Negative for fracture or focal lesion. Sinuses/Orbits: Preferential gaze to the right Other: Discussed with Dr. Ezzie Dural while in progress ASPECTS [facility] Stroke Program Early CT Score) - Ganglionic level infarction (caudate, lentiform nuclei, internal capsule, insula, M1-M3 cortex): 7 - Supraganglionic infarction (M4-M6 cortex): 3 Total score (0-10 with 10 being normal): 10 IMPRESSION: 1. No acute finding. 2. Atrophy and chronic small vessel ischemia with small chronic right frontal cortex infarcts. Electronically Signed   By: Tiburcio Pea M.D.   On: 06/06/2023 05:18    Labs: BNP (last 3 results) No results for input(s): "BNP" in the last 8760 hours. Basic Metabolic Panel: Recent Labs  Lab 06/06/23 0447 06/06/23 0450 06/09/23 0800  NA 137 138 134*  K 3.8 3.9 3.8  CL 102 103 102  CO2 25  --  22  GLUCOSE 101* 98 116*  BUN 9 9 10   CREATININE 0.97 1.00 1.00  CALCIUM 8.8*  --  8.9   Liver Function Tests: Recent Labs  Lab 06/06/23 0447  AST 43*  ALT 38  ALKPHOS 51  BILITOT 0.5  PROT 6.4*  ALBUMIN 2.9*   No results for  input(s): "LIPASE", "AMYLASE" in the last 168 hours. No results for input(s): "AMMONIA" in the last 168 hours. CBC: Recent Labs  Lab 06/06/23 0447 06/06/23 0450 06/09/23 0800  WBC 7.7  --  13.5*  NEUTROABS 3.3  --   --   HGB 12.0 12.6 13.2  HCT 36.8 37.0 39.8  MCV 95.6  --  95.9  PLT 268  --  287   Cardiac Enzymes: No results for input(s): "CKTOTAL", "CKMB", "CKMBINDEX", "TROPONINI" in the last 168 hours. BNP: Invalid input(s): "POCBNP" CBG: Recent Labs  Lab 06/08/23 2111 06/09/23 0043 06/09/23 0427 06/09/23 0755 06/09/23 1131  GLUCAP 122* 120* 111* 103* 103*  No results for input(s): "VITAMINB12", "FOLATE", "FERRITIN", "TIBC", "IRON", "RETICCTPCT" in the last 72 hours. Urinalysis    Component Value Date/Time   COLORURINE YELLOW 06/06/2023 1750   APPEARANCEUR CLEAR 06/06/2023 1750   APPEARANCEUR Clear 01/11/2023 1029   LABSPEC 1.036 (H) 06/06/2023 1750   PHURINE 7.0 06/06/2023 1750   GLUCOSEU NEGATIVE 06/06/2023 1750   HGBUR NEGATIVE 06/06/2023 1750   BILIRUBINUR NEGATIVE 06/06/2023 1750   BILIRUBINUR Negative 01/11/2023 1029   KETONESUR NEGATIVE 06/06/2023 1750   PROTEINUR NEGATIVE 06/06/2023 1750   NITRITE NEGATIVE 06/06/2023 1750   LEUKOCYTESUR NEGATIVE 06/06/2023 1750   Sepsis Labs Recent Labs  Lab 06/06/23 0447 06/09/23 0800  WBC 7.7 13.5*  Microbiology No results found for this or any previous visit (from the past 240 hour(s)).   Time coordinating discharge: 35 minutes  SIGNED: Lanae Boast, MD  [facility] 06/09/2023, 11:58 AM  If 7PM-7AM, please contact night-coverage [email or web address]

## 2023-06-09 NOTE — Care Management Important Message (Signed)
Important Message  Patient Details  Name: Sarah Freeman MRN: [id number] Date of Birth: [birth year]   Medicare Important Message Given:  Yes     Sherilyn Banker 06/09/2023, 12:18 PM

## 2023-06-09 NOTE — Plan of Care (Signed)
Problem: Education: Goal: Knowledge of disease or condition will improve 06/09/2023 0621 by Gerlean Ren, RN Outcome: Progressing 06/09/2023 0506 by Gerlean Ren, RN Outcome: Progressing Goal: Knowledge of secondary prevention will improve (MUST DOCUMENT ALL) 06/09/2023 0621 by Gerlean Ren, RN Outcome: Progressing 06/09/2023 0506 by Gerlean Ren, RN Outcome: Progressing Goal: Knowledge of patient specific risk factors will improve Loraine Leriche N/A or DELETE if not current risk factor) 06/09/2023 0621 by Gerlean Ren, RN Outcome: Progressing 06/09/2023 0506 by Gerlean Ren, RN Outcome: Progressing   Problem: Ischemic Stroke/TIA Tissue Perfusion: Goal: Complications of ischemic stroke/TIA will be minimized 06/09/2023 0621 by Gerlean Ren, RN Outcome: Progressing 06/09/2023 0506 by Gerlean Ren, RN Outcome: Progressing   Problem: Coping: Goal: Will verbalize positive feelings about self 06/09/2023 0621 by Gerlean Ren, RN Outcome: Progressing 06/09/2023 0506 by Gerlean Ren, RN Outcome: Progressing Goal: Will identify appropriate support needs 06/09/2023 0621 by Gerlean Ren, RN Outcome: Progressing 06/09/2023 0506 by Gerlean Ren, RN Outcome: Progressing   Problem: Health Behavior/Discharge Planning: Goal: Ability to manage health-related needs will improve 06/09/2023 0621 by Gerlean Ren, RN Outcome: Progressing 06/09/2023 0506 by Gerlean Ren, RN Outcome: Progressing Goal: Goals will be collaboratively established with patient/family 06/09/2023 [phone] by Gerlean Ren, RN Outcome: Progressing 06/09/2023 0506 by Gerlean Ren, RN Outcome: Progressing   Problem: Self-Care: Goal: Ability to participate in self-care as condition permits will improve 06/09/2023 0621 by Gerlean Ren, RN Outcome: Progressing 06/09/2023 0506 by Gerlean Ren, RN Outcome: Progressing Goal: Verbalization of  feelings and concerns over difficulty with self-care will improve 06/09/2023 0621 by Gerlean Ren, RN Outcome: Progressing 06/09/2023 0506 by Gerlean Ren, RN Outcome: Progressing Goal: Ability to communicate needs accurately will improve 06/09/2023 0621 by Gerlean Ren, RN Outcome: Progressing 06/09/2023 0506 by Gerlean Ren, RN Outcome: Progressing   Problem: Nutrition: Goal: Risk of aspiration will decrease 06/09/2023 0621 by Gerlean Ren, RN Outcome: Progressing 06/09/2023 0506 by Gerlean Ren, RN Outcome: Progressing Goal: Dietary intake will improve 06/09/2023 0621 by Gerlean Ren, RN Outcome: Progressing 06/09/2023 0506 by Gerlean Ren, RN Outcome: Progressing   Problem: Education: Goal: Knowledge of General Education information will improve Description: Including pain rating scale, medication(s)/side effects and non-pharmacologic comfort measures 06/09/2023 0621 by Gerlean Ren, RN Outcome: Progressing 06/09/2023 0506 by Gerlean Ren, RN Outcome: Progressing   Problem: Health Behavior/Discharge Planning: Goal: Ability to manage health-related needs will improve 06/09/2023 0621 by Gerlean Ren, RN Outcome: Progressing 06/09/2023 0506 by Gerlean Ren, RN Outcome: Progressing   Problem: Clinical Measurements: Goal: Ability to maintain clinical measurements within normal limits will improve 06/09/2023 0621 by Gerlean Ren, RN Outcome: Progressing 06/09/2023 0506 by Gerlean Ren, RN Outcome: Progressing Goal: Will remain free from infection 06/09/2023 0621 by Gerlean Ren, RN Outcome: Progressing 06/09/2023 0506 by Gerlean Ren, RN Outcome: Progressing Goal: Diagnostic test results will improve 06/09/2023 0621 by Gerlean Ren, RN Outcome: Progressing 06/09/2023 0506 by Gerlean Ren, RN Outcome: Progressing Goal: Respiratory complications will improve 06/09/2023 0621 by  Gerlean Ren, RN Outcome: Progressing 06/09/2023 0506 by Gerlean Ren, RN Outcome: Progressing Goal: Cardiovascular complication will be avoided 06/09/2023 0621 by Gerlean Ren, RN Outcome: Progressing 06/09/2023 0506 by Gerlean Ren, RN Outcome: Progressing   Problem: Activity: Goal: Risk for activity intolerance will decrease 06/09/2023 0621 by Gerlean Ren, RN Outcome: Progressing  06/09/2023 0506 by Gerlean Ren, RN Outcome: Progressing   Problem: Nutrition: Goal: Adequate nutrition will be maintained 06/09/2023 0621 by Gerlean Ren, RN Outcome: Progressing 06/09/2023 0506 by Gerlean Ren, RN Outcome: Progressing   Problem: Coping: Goal: Level of anxiety will decrease 06/09/2023 0621 by Gerlean Ren, RN Outcome: Progressing 06/09/2023 0506 by Gerlean Ren, RN Outcome: Progressing   Problem: Elimination: Goal: Will not experience complications related to bowel motility 06/09/2023 0621 by Gerlean Ren, RN Outcome: Progressing 06/09/2023 0506 by Gerlean Ren, RN Outcome: Progressing Goal: Will not experience complications related to urinary retention 06/09/2023 0621 by Gerlean Ren, RN Outcome: Progressing 06/09/2023 0506 by Gerlean Ren, RN Outcome: Progressing   Problem: Pain Managment: Goal: General experience of comfort will improve 06/09/2023 0621 by Gerlean Ren, RN Outcome: Progressing 06/09/2023 0506 by Gerlean Ren, RN Outcome: Progressing   Problem: Safety: Goal: Ability to remain free from injury will improve 06/09/2023 0621 by Gerlean Ren, RN Outcome: Progressing 06/09/2023 0506 by Gerlean Ren, RN Outcome: Progressing   Problem: Skin Integrity: Goal: Risk for impaired skin integrity will decrease 06/09/2023 0621 by Gerlean Ren, RN Outcome: Progressing 06/09/2023 0506 by Gerlean Ren, RN Outcome: Progressing

## 2023-06-09 NOTE — TOC Transition Note (Signed)
Transition of Care [facility]) - CM/SW Discharge Note   Patient Details  Name: Sarah Freeman MRN: [id number] Date of Birth: 12/09/1930  Transition of Care [facility]) CM/SW Contact:  Baldemar Lenis, LCSW Phone Number: 06/09/2023, 3:42 PM   Clinical Narrative:   CSW received insurance authorization for patient to admit to SNF, patient medically stable. CSW sent discharge information to [employer] and confirmed patient could admit today, bed will be available later this afternoon. CSW met with family at bedside to update, they are in agreement. Transport arranged with [facility] for after 5 pm.   Nurse to call report to [phone].    Final next level of care: Skilled Nursing Facility Barriers to Discharge: Barriers Resolved   Patient Goals and CMS Choice CMS Medicare.gov Compare Post Acute Care list provided to:: Patient Represenative (must comment) Choice offered to / list presented to : Adult Children  Discharge Placement                Patient chooses bed at: [facility] Patient to be transferred to facility by: [facility] Name of family member notified: Daughter Patient and family notified of of transfer: 06/09/23  Discharge Plan and Services Additional resources added to the After Visit Summary for       Post Acute Care Choice: Skilled Nursing Facility                               Social Determinants of Health (SDOH) Interventions SDOH Screenings   Food Insecurity: No Food Insecurity (06/08/2023)  Housing: Low Risk  (06/08/2023)  Transportation Needs: No Transportation Needs (06/08/2023)  Utilities: Not At Risk (06/08/2023)  Alcohol Screen: Low Risk  (02/01/2023)  Depression (PHQ2-9): Low Risk  (05/17/2023)  Financial Resource Strain: Low Risk  (02/01/2023)  Physical Activity: Insufficiently Active (02/01/2023)  Social Connections: Moderately Isolated (02/01/2023)  Stress: No Stress Concern Present (02/01/2023)  Tobacco Use: Low Risk  (06/06/2023)      Readmission Risk Interventions     No data to display

## 2023-06-09 NOTE — Plan of Care (Signed)

## 2023-06-15 ENCOUNTER — Telehealth: Payer: Self-pay | Admitting: Family Medicine

## 2023-06-15 NOTE — Telephone Encounter (Signed)
Sarah Freeman with [facility] is calling in to check the status of some orders she faxed over regarding pt's home health plan of care. Lamar Laundry is requesting someone follow up with her regarding this matter.

## 2023-06-17 NOTE — Telephone Encounter (Signed)
Paperwork has been faxed back

## 2023-06-29 ENCOUNTER — Other Ambulatory Visit: Payer: Self-pay | Admitting: Family Medicine

## 2023-06-29 NOTE — Telephone Encounter (Signed)
Requested medication (s) are due for refill today: routing for review  Requested medication (s) are on the active medication list: yes  Last refill:  06/09/23  Future visit scheduled: yes  Notes to clinic:  Unable to refill per protocol, last refill by  ED provider.      Requested Prescriptions  Pending Prescriptions Disp Refills   gabapentin (NEURONTIN) 100 MG capsule [Pharmacy Med Name: GABAPENTIN 100MG  CAPSULES] 270 capsule     Sig: TAKE 2 CAPSULES BY MOUTH EVERY MORNING AND 1 CAPSULE AT 1 PM     Neurology: Anticonvulsants - gabapentin Passed - 06/29/2023  7:07 AM      Passed - Cr in normal range and within 360 days    Creatinine, Ser  Date Value Ref Range Status  06/09/2023 1.00 0.44 - 1.00 mg/dL Final         Passed - Completed PHQ-2 or PHQ-9 in the last 360 days      Passed - Valid encounter within last 12 months    Recent Outpatient Visits           1 month ago History of CVA with residual deficit   [place] [facility] [place], Megan P, DO   5 months ago Dysuria   [place] [facility] [place], Megan P, DO   5 months ago Routine general medical examination at a health care facility   [facility], Megan P, DO   9 months ago Memory loss   [facility] [place], Megan P, DO   11 months ago Pain with urination   [place] [facility] Mecum, Oswaldo Conroy, PA-C       Future Appointments             In 1 month Gollan, Tollie Pizza, MD [facility] [place]   In 1 month Dorcas Carrow, DO [facility]   In 4 months MacDiarmid, Lorin Picket, MD [facility]

## 2023-07-05 ENCOUNTER — Ambulatory Visit: Payer: Medicare Other | Admitting: Family Medicine

## 2023-07-09 ENCOUNTER — Other Ambulatory Visit: Payer: Self-pay | Admitting: Family Medicine

## 2023-07-12 NOTE — Telephone Encounter (Signed)
Requested Prescriptions  Pending Prescriptions Disp Refills   pantoprazole (PROTONIX) 40 MG tablet [Pharmacy Med Name: PANTOPRAZOLE 40MG  TABLETS] 90 tablet 1    Sig: TAKE 1 TABLET(40 MG) BY MOUTH DAILY     Gastroenterology: Proton Pump Inhibitors Passed - 07/09/2023  1:29 PM      Passed - Valid encounter within last 12 months    Recent Outpatient Visits           1 month ago History of CVA with residual deficit   [place] [facility] [place], Megan P, DO   6 months ago Dysuria   [place] [facility] [place], Megan P, DO   6 months ago Routine general medical examination at a health care facility   [facility], Megan P, DO   9 months ago Memory loss   [place] [facility] [place], Megan P, DO   11 months ago Pain with urination   [place] [facility] Mecum, Oswaldo Conroy, PA-C       Future Appointments             In 1 month Gollan, Tollie Pizza, MD [facility] [place]   In 1 month Dorcas Carrow, DO [facility]   In 3 months MacDiarmid, Lorin Picket, MD [facility]

## 2023-08-16 ENCOUNTER — Ambulatory Visit: Payer: Medicare Other | Admitting: Cardiovascular Disease

## 2023-08-18 ENCOUNTER — Ambulatory Visit: Payer: Medicare Other | Admitting: Family Medicine

## 2023-10-20 ENCOUNTER — Emergency Department: Payer: Medicare Other

## 2023-10-20 ENCOUNTER — Other Ambulatory Visit: Payer: Self-pay

## 2023-10-20 ENCOUNTER — Inpatient Hospital Stay
Admission: EM | Admit: 2023-10-20 | Discharge: 2023-10-21 | DRG: 871 | Disposition: A | Discharge status: Other Acute Inpatient Hospital | Payer: Medicare Other | Source: Skilled Nursing Facility | Attending: Pulmonary Disease | Admitting: Pulmonary Disease

## 2023-10-20 DIAGNOSIS — F0150 Vascular dementia without behavioral disturbance: Secondary | ICD-10-CM | POA: Diagnosis present

## 2023-10-20 DIAGNOSIS — Z90710 Acquired absence of both cervix and uterus: Secondary | ICD-10-CM

## 2023-10-20 DIAGNOSIS — L8990 Pressure ulcer of unspecified site, unspecified stage: Secondary | ICD-10-CM | POA: Insufficient documentation

## 2023-10-20 DIAGNOSIS — R7989 Other specified abnormal findings of blood chemistry: Secondary | ICD-10-CM | POA: Diagnosis present

## 2023-10-20 DIAGNOSIS — D75839 Thrombocytosis, unspecified: Secondary | ICD-10-CM | POA: Diagnosis present

## 2023-10-20 DIAGNOSIS — Z7982 Long term (current) use of aspirin: Secondary | ICD-10-CM

## 2023-10-20 DIAGNOSIS — I129 Hypertensive chronic kidney disease with stage 1 through stage 4 chronic kidney disease, or unspecified chronic kidney disease: Secondary | ICD-10-CM | POA: Diagnosis present

## 2023-10-20 DIAGNOSIS — E669 Obesity, unspecified: Secondary | ICD-10-CM | POA: Diagnosis present

## 2023-10-20 DIAGNOSIS — G9341 Metabolic encephalopathy: Principal | ICD-10-CM | POA: Diagnosis present

## 2023-10-20 DIAGNOSIS — Z823 Family history of stroke: Secondary | ICD-10-CM

## 2023-10-20 DIAGNOSIS — R7401 Elevation of levels of liver transaminase levels: Secondary | ICD-10-CM | POA: Diagnosis present

## 2023-10-20 DIAGNOSIS — Z85828 Personal history of other malignant neoplasm of skin: Secondary | ICD-10-CM

## 2023-10-20 DIAGNOSIS — Z7983 Long term (current) use of bisphosphonates: Secondary | ICD-10-CM

## 2023-10-20 DIAGNOSIS — Z6833 Body mass index (BMI) 33.0-33.9, adult: Secondary | ICD-10-CM

## 2023-10-20 DIAGNOSIS — I10 Essential (primary) hypertension: Secondary | ICD-10-CM | POA: Diagnosis present

## 2023-10-20 DIAGNOSIS — B9689 Other specified bacterial agents as the cause of diseases classified elsewhere: Secondary | ICD-10-CM | POA: Diagnosis present

## 2023-10-20 DIAGNOSIS — R6521 Severe sepsis with septic shock: Secondary | ICD-10-CM | POA: Diagnosis present

## 2023-10-20 DIAGNOSIS — I69354 Hemiplegia and hemiparesis following cerebral infarction affecting left non-dominant side: Secondary | ICD-10-CM

## 2023-10-20 DIAGNOSIS — K8309 Other cholangitis: Secondary | ICD-10-CM | POA: Diagnosis present

## 2023-10-20 DIAGNOSIS — Z8249 Family history of ischemic heart disease and other diseases of the circulatory system: Secondary | ICD-10-CM

## 2023-10-20 DIAGNOSIS — E785 Hyperlipidemia, unspecified: Secondary | ICD-10-CM | POA: Diagnosis present

## 2023-10-20 DIAGNOSIS — K838 Other specified diseases of biliary tract: Secondary | ICD-10-CM

## 2023-10-20 DIAGNOSIS — R509 Fever, unspecified: Secondary | ICD-10-CM | POA: Diagnosis present

## 2023-10-20 DIAGNOSIS — I700 Atherosclerosis of aorta: Secondary | ICD-10-CM | POA: Diagnosis present

## 2023-10-20 DIAGNOSIS — K5641 Fecal impaction: Secondary | ICD-10-CM | POA: Diagnosis present

## 2023-10-20 DIAGNOSIS — Z833 Family history of diabetes mellitus: Secondary | ICD-10-CM

## 2023-10-20 DIAGNOSIS — Z7902 Long term (current) use of antithrombotics/antiplatelets: Secondary | ICD-10-CM

## 2023-10-20 DIAGNOSIS — Z66 Do not resuscitate: Secondary | ICD-10-CM | POA: Diagnosis present

## 2023-10-20 DIAGNOSIS — Z888 Allergy status to other drugs, medicaments and biological substances status: Secondary | ICD-10-CM

## 2023-10-20 DIAGNOSIS — Z79899 Other long term (current) drug therapy: Secondary | ICD-10-CM

## 2023-10-20 DIAGNOSIS — A4151 Sepsis due to Escherichia coli [E. coli]: Secondary | ICD-10-CM | POA: Diagnosis not present

## 2023-10-20 DIAGNOSIS — Z9049 Acquired absence of other specified parts of digestive tract: Secondary | ICD-10-CM

## 2023-10-20 DIAGNOSIS — I69319 Unspecified symptoms and signs involving cognitive functions following cerebral infarction: Secondary | ICD-10-CM

## 2023-10-20 DIAGNOSIS — Z79890 Hormone replacement therapy (postmenopausal): Secondary | ICD-10-CM

## 2023-10-20 DIAGNOSIS — K449 Diaphragmatic hernia without obstruction or gangrene: Secondary | ICD-10-CM | POA: Diagnosis present

## 2023-10-20 DIAGNOSIS — E039 Hypothyroidism, unspecified: Secondary | ICD-10-CM | POA: Diagnosis present

## 2023-10-20 DIAGNOSIS — K529 Noninfective gastroenteritis and colitis, unspecified: Secondary | ICD-10-CM | POA: Diagnosis present

## 2023-10-20 DIAGNOSIS — Z90722 Acquired absence of ovaries, bilateral: Secondary | ICD-10-CM

## 2023-10-20 DIAGNOSIS — Z1152 Encounter for screening for COVID-19: Secondary | ICD-10-CM

## 2023-10-20 DIAGNOSIS — Z881 Allergy status to other antibiotic agents status: Secondary | ICD-10-CM

## 2023-10-20 DIAGNOSIS — Z7401 Bed confinement status: Secondary | ICD-10-CM

## 2023-10-20 DIAGNOSIS — N390 Urinary tract infection, site not specified: Secondary | ICD-10-CM | POA: Diagnosis present

## 2023-10-20 DIAGNOSIS — E872 Acidosis, unspecified: Secondary | ICD-10-CM | POA: Diagnosis present

## 2023-10-20 DIAGNOSIS — Z9079 Acquired absence of other genital organ(s): Secondary | ICD-10-CM

## 2023-10-20 DIAGNOSIS — K219 Gastro-esophageal reflux disease without esophagitis: Secondary | ICD-10-CM | POA: Diagnosis present

## 2023-10-20 DIAGNOSIS — N183 Chronic kidney disease, stage 3 unspecified: Secondary | ICD-10-CM | POA: Diagnosis present

## 2023-10-20 LAB — CBC WITH DIFFERENTIAL/PLATELET
Abs Immature Granulocytes: 0.07 10*3/uL (ref 0.00–0.07)
Basophils Absolute: 0.1 10*3/uL (ref 0.0–0.1)
Basophils Relative: 1 %
Eosinophils Absolute: 0.3 10*3/uL (ref 0.0–0.5)
Eosinophils Relative: 1 %
HCT: 37 % (ref 36.0–46.0)
Hemoglobin: 12 g/dL (ref 12.0–15.0)
Immature Granulocytes: 0 %
Lymphocytes Relative: 17 %
Lymphs Abs: 3.5 10*3/uL (ref 0.7–4.0)
MCH: 31.6 pg (ref 26.0–34.0)
MCHC: 32.4 g/dL (ref 30.0–36.0)
MCV: 97.4 fL (ref 80.0–100.0)
Monocytes Absolute: 1.3 10*3/uL — ABNORMAL HIGH (ref 0.1–1.0)
Monocytes Relative: 7 %
Neutro Abs: 14.8 10*3/uL — ABNORMAL HIGH (ref 1.7–7.7)
Neutrophils Relative %: 74 %
Platelets: 475 10*3/uL — ABNORMAL HIGH (ref 150–400)
RBC: 3.8 MIL/uL — ABNORMAL LOW (ref 3.87–5.11)
RDW: 13.3 % (ref 11.5–15.5)
WBC: 20 10*3/uL — ABNORMAL HIGH (ref 4.0–10.5)
nRBC: 0 % (ref 0.0–0.2)

## 2023-10-20 LAB — COMPREHENSIVE METABOLIC PANEL
ALT: 123 U/L — ABNORMAL HIGH (ref 0–44)
AST: 431 U/L — ABNORMAL HIGH (ref 15–41)
Albumin: 2.5 g/dL — ABNORMAL LOW (ref 3.5–5.0)
Alkaline Phosphatase: 309 U/L — ABNORMAL HIGH (ref 38–126)
Anion gap: 10 (ref 5–15)
BUN: 10 mg/dL (ref 8–23)
CO2: 26 mmol/L (ref 22–32)
Calcium: 8.2 mg/dL — ABNORMAL LOW (ref 8.9–10.3)
Chloride: 98 mmol/L (ref 98–111)
Creatinine, Ser: 0.81 mg/dL (ref 0.44–1.00)
GFR, Estimated: >60 mL/min (ref >60)
Glucose, Bld: 156 mg/dL — ABNORMAL HIGH (ref 70–99)
Potassium: 3.8 mmol/L (ref 3.5–5.1)
Sodium: 134 mmol/L — ABNORMAL LOW (ref 135–145)
Total Bilirubin: 1.1 mg/dL (ref <1.2)
Total Protein: 6.8 g/dL (ref 6.5–8.1)

## 2023-10-20 LAB — URINALYSIS, W/ REFLEX TO CULTURE (INFECTION SUSPECTED)
Bilirubin Urine: NEGATIVE
Glucose, UA: NEGATIVE mg/dL
Ketones, ur: NEGATIVE mg/dL
Nitrite: POSITIVE — AB
Protein, ur: NEGATIVE mg/dL
Specific Gravity, Urine: 1.01 (ref 1.005–1.030)
WBC, UA: >50 WBC/hpf (ref 0–5)
pH: 7 (ref 5.0–8.0)

## 2023-10-20 LAB — APTT: aPTT: 26 s (ref 24–36)

## 2023-10-20 LAB — PROTIME-INR
INR: 1.1 (ref 0.8–1.2)
Prothrombin Time: 14.4 s (ref 11.4–15.2)

## 2023-10-20 LAB — LACTIC ACID, PLASMA
Lactic Acid, Venous: 2.5 mmol/L (ref 0.5–1.9)
Lactic Acid, Venous: 4.1 mmol/L (ref 0.5–1.9)

## 2023-10-20 LAB — RESP PANEL BY RT-PCR (RSV, FLU A&B, COVID)  RVPGX2
Influenza A by PCR: NEGATIVE
Influenza B by PCR: NEGATIVE
Resp Syncytial Virus by PCR: NEGATIVE
SARS Coronavirus 2 by RT PCR: NEGATIVE

## 2023-10-20 MED ORDER — LACTATED RINGERS IV SOLN: INTRAVENOUS | Status: AC

## 2023-10-20 MED ORDER — SODIUM CHLORIDE 0.9 % IV SOLN
2.0000 g | INTRAVENOUS | Status: DC
  Administered 2023-10-20: 2 g via INTRAVENOUS
  Filled 2023-10-20: qty 20

## 2023-10-20 MED ORDER — ACETAMINOPHEN 500 MG PO TABS
1000.0000 mg | ORAL_TABLET | Freq: Once | ORAL | Status: AC
  Administered 2023-10-20: 1000 mg via ORAL
  Filled 2023-10-20: qty 2

## 2023-10-20 MED ORDER — LACTATED RINGERS IV BOLUS (SEPSIS)
1000.0000 mL | Freq: Once | INTRAVENOUS | Status: AC
  Administered 2023-10-21: 1000 mL via INTRAVENOUS

## 2023-10-20 MED ORDER — SODIUM CHLORIDE 0.9 % IV SOLN
500.0000 mg | INTRAVENOUS | Status: DC
  Administered 2023-10-20: 500 mg via INTRAVENOUS
  Filled 2023-10-20: qty 5

## 2023-10-20 MED ORDER — IOHEXOL 300 MG/ML  SOLN
100.0000 mL | Freq: Once | INTRAMUSCULAR | Status: AC | PRN
  Administered 2023-10-20: 100 mL via INTRAVENOUS

## 2023-10-20 MED ORDER — LACTATED RINGERS IV BOLUS
1000.0000 mL | Freq: Once | INTRAVENOUS | Status: AC
  Administered 2023-10-21: 1000 mL via INTRAVENOUS

## 2023-10-20 NOTE — ED Notes (Signed)
In and out cath done by this NT+3 and Jon Gills NT

## 2023-10-20 NOTE — ED Provider Notes (Signed)
[facility] Provider Note   Event Date/Time   First MD Initiated Contact with Patient 10/20/23 2029     (approximate) History  Fever and Emesis  HPI Sarah Freeman is a 89 y.o. female with a past medical history of hypothyroidism, hyperlipidemia, CVA with residual cognitive impairment who presents via EMS from [facility] with complaints of fever and episode of projectile vomiting.  EMS states patient is at her normal neurologic baseline.  EMS notes that patient does have history of recurrent urinary tract infections.  Patient is a DNR ROS: Unable to assess   Physical Exam  Triage Vital Signs: ED Triage Vitals  Encounter Vitals Group     BP 10/20/23 2045 (!) 119/42     Systolic BP Percentile --      Diastolic BP Percentile --      Pulse Rate 10/20/23 2045 (!) 119     Resp 10/20/23 2045 19     Temp 10/20/23 2045 (!) 102.7 F (39.3 C)     Temp Source 10/20/23 2045 Axillary     SpO2 10/20/23 2045 99 %     Weight 10/20/23 2032 149 lb 14.6 oz (68 kg)     Height --      Head Circumference --      Peak Flow --      Pain Score --      Pain Loc --      Pain Education --      Exclude from Growth Chart --    Most recent vital signs: Vitals:   10/20/23 2215 10/20/23 2240  BP:  117/63  Pulse: (!) 116 (!) 119  Resp: 20 (!) 25  Temp:    SpO2: 96% 98%   General: Awake, oriented x4. CV:  Good peripheral perfusion.  Resp:  Normal effort.  Abd:  No distention.  Other:  Elderly obese Caucasian female resting comfortably in no acute distress ED Results / Procedures / Treatments  Labs (all labs ordered are listed, but only abnormal results are displayed) Labs Reviewed  COMPREHENSIVE METABOLIC PANEL - Abnormal; Notable for the following components:      Result Value   Sodium 134 (*)    Glucose, Bld 156 (*)    Calcium 8.2 (*)    Albumin 2.5 (*)    AST 431 (*)    ALT 123 (*)    Alkaline Phosphatase 309 (*)    All other components within normal  limits  LACTIC ACID, PLASMA - Abnormal; Notable for the following components:   Lactic Acid, Venous 2.5 (*)    All other components within normal limits  CBC WITH DIFFERENTIAL/PLATELET - Abnormal; Notable for the following components:   WBC 20.0 (*)    RBC 3.80 (*)    Platelets 475 (*)    Neutro Abs 14.8 (*)    Monocytes Absolute 1.3 (*)    All other components within normal limits  URINALYSIS, W/ REFLEX TO CULTURE (INFECTION SUSPECTED) - Abnormal; Notable for the following components:   Color, Urine AMBER (*)    APPearance TURBID (*)    Hgb urine dipstick SMALL (*)    Nitrite POSITIVE (*)    Leukocytes,Ua LARGE (*)    Bacteria, UA MANY (*)    All other components within normal limits  RESP PANEL BY RT-PCR (RSV, FLU A&B, COVID)  RVPGX2  CULTURE, BLOOD (ROUTINE X 2)  CULTURE, BLOOD (ROUTINE X 2)  URINE CULTURE  PROTIME-INR  APTT  LACTIC ACID, PLASMA  EKG ED ECG REPORT I, Merwyn Katos, the attending physician, personally viewed and interpreted this ECG. Date: 10/20/2023 EKG Time: 2050 Rate: 127 Rhythm: Tachycardic sinus rhythm QRS Axis: normal Intervals: normal ST/T Wave abnormalities: normal Narrative Interpretation: Tachycardic sinus rhythm.  No evidence of acute ischemia RADIOLOGY ED MD interpretation: One-view portable chest x-ray interpreted by me shows no evidence of acute abnormalities including no pneumonia, pneumothorax, or widened mediastinum -Agree with radiology assessment Official radiology report(s): CT ABDOMEN PELVIS W CONTRAST  Result Date: 10/20/2023 CLINICAL DATA:  89 year old, provided history of sepsis. Fever with vomiting. EXAM: CT ABDOMEN AND PELVIS WITH CONTRAST TECHNIQUE: Multidetector CT imaging of the abdomen and pelvis was performed using the standard protocol following bolus administration of intravenous contrast. RADIATION DOSE REDUCTION: This exam was performed according to the departmental dose-optimization program which includes automated  exposure control, adjustment of the mA and/or kV according to patient size and/or use of iterative reconstruction technique. CONTRAST:  OMNIPAQUE IOHEXOL 300 MG/ML  SOLN COMPARISON:  None Available. FINDINGS: Lower chest: Scattered atelectasis and chronic lung disease with subpleural reticulation. Coronary artery calcifications. Hepatobiliary: Patient is post cholecystectomy. There is intra and extrahepatic biliary ductal dilatation. The common bile duct measures 15 mm at the porta hepatis and tapers distally. No visible choledocholithiasis. No suspicious hepatic lesion. Pancreas: Parenchymal atrophy. No ductal dilatation or inflammation. Spleen: Normal in size without focal abnormality. Adrenals/Urinary Tract: No adrenal nodule. Prominence of the renal collecting systems but no hydronephrosis. Symmetric renal enhancement and excretion on delayed phase imaging. No renal calculi or suspicious renal abnormality. Physiologically distended urinary bladder, no bladder wall thickening. Stomach/Bowel: Motion artifact limits assessment. Small hiatal hernia with fluid in the distal esophagus. Fluid within the stomach without gastric inflammation or wall thickening. No small bowel obstruction or inflammation. The appendix is not definitively seen. Moderate volume of stool throughout the colon. There is colonic redundancy. Colonic diverticulosis without focal diverticulitis. There is moderate stool distention of the rectum, rectal distention of 7.2 cm. Mild rectal wall thickening and perirectal edema. Vascular/Lymphatic: Aortic atherosclerosis without aneurysm. No acute vascular findings. No abdominopelvic adenopathy. Reproductive: Status post hysterectomy. No adnexal masses. Other: No free air or ascites. Musculoskeletal: Scoliosis and degenerative change in the lumbar spine. There are chronic calcifications in the left gluteus medius muscle posterior to the left acetabulum. IMPRESSION: 1. Moderate stool distention of  the rectum with mild rectal wall thickening and perirectal edema, suspicious for stercoral colitis. 2. Moderate volume of stool throughout the colon, constipation suspected. Colonic diverticulosis without diverticulitis. 3. Intra and extrahepatic biliary ductal dilatation, likely due to post cholecystectomy state. Recommend correlation with liver function tests. If LFTs are normal, no further workup is recommended. If LFTs are elevated, MRCP is the workup of choice, however not recommended in this patient who cannot hold still for CT. 4. Small hiatal hernia with fluid in the distal esophagus, can be seen with gastroesophageal reflux. Aortic Atherosclerosis (ICD10-I70.0). Electronically Signed   By: Narda Rutherford M.D.   On: 10/20/2023 22:35   DG Chest 1 View  Result Date: 10/20/2023 CLINICAL DATA:  Fever, vomiting EXAM: CHEST  1 VIEW COMPARISON:  12/21/2018 FINDINGS: Single frontal view of the chest demonstrates an unremarkable cardiac silhouette. Lung volumes are diminished, without airspace disease, effusion, or pneumothorax. No acute bony abnormalities. IMPRESSION: 1. Low lung volumes.  No acute process. Electronically Signed   By: Sharlet Salina M.D.   On: 10/20/2023 21:16   PROCEDURES: Critical Care performed: Yes, see critical care procedure note(s) .  1-3 Lead EKG Interpretation  Performed by: Merwyn Katos, MD Authorized by: Merwyn Katos, MD     Interpretation: abnormal     ECG rate:  111   ECG rate assessment: tachycardic     Rhythm: sinus tachycardia     Ectopy: none     Conduction: normal   CRITICAL CARE Performed by: Merwyn Katos  Total critical care time: 43 minutes  Critical care time was exclusive of separately billable procedures and treating other patients.  Critical care was necessary to treat or prevent imminent or life-threatening deterioration.  Critical care was time spent personally by me on the following activities: development of treatment plan with patient  and/or surrogate as well as nursing, discussions with consultants, evaluation of patient's response to treatment, examination of patient, obtaining history from patient or surrogate, ordering and performing treatments and interventions, ordering and review of laboratory studies, ordering and review of radiographic studies, pulse oximetry and re-evaluation of patient's condition.  MEDICATIONS ORDERED IN ED: Medications  lactated ringers infusion ( Intravenous New Bag/Given 10/20/23 2118)  cefTRIAXone (ROCEPHIN) 2 g in sodium chloride 0.9 % 100 mL IVPB (0 g Intravenous Stopped 10/20/23 2238)  azithromycin (ZITHROMAX) 500 mg in sodium chloride 0.9 % 250 mL IVPB (500 mg Intravenous New Bag/Given 10/20/23 2237)  lactated ringers bolus 1,000 mL (has no administration in time range)  acetaminophen (TYLENOL) tablet 1,000 mg (1,000 mg Oral Given 10/20/23 2234)  iohexol (OMNIPAQUE) 300 MG/ML solution 100 mL (100 mLs Intravenous Contrast Given 10/20/23 2209)   IMPRESSION / MDM / ASSESSMENT AND PLAN / ED COURSE  I reviewed the triage vital signs and the nursing notes.                             The patient is on the cardiac monitor to evaluate for evidence of arrhythmia and/or significant heart rate changes. Patient's presentation is most consistent with acute presentation with potential threat to life or bodily function. The Pt presents with fever and vomiting highly concerning for sepsis (suspected intra-abdominal source). At this time, the Pt is satting well on room air, normotensive, and tachycardia neck.  Will start empiric antibiotics and fluids.  Will administer fluids gradually with frequent reassessment. Have low suspicion for a GI, skin/soft tissue, or CNS source at this time, but will reconsider if initial workup is unremarkable.  - CBC, BMP, LFTs - VBG - UA - BCx x2, Lactate - EKG - CXR - CT A/P - Empiric Abx: Rocephin and azithromycin - Fluids: 1 L LR  UA showing positive nitrites,  leukocytes, and bacteria  Dispo: Admit to medicine   FINAL CLINICAL IMPRESSION(S) / ED DIAGNOSES   Final diagnoses:  Sepsis with encephalopathy without septic shock, due to unspecified organism [facility])   Rx / DC Orders   ED Discharge Orders     None      Note:  This document was prepared using Dragon voice recognition software and may include unintentional dictation errors.   Merwyn Katos, MD 10/20/23 [phone]

## 2023-10-20 NOTE — ED Notes (Signed)
Pt alert, family with pt.  Iv fluids infusing.

## 2023-10-20 NOTE — ED Triage Notes (Signed)
Pt presents to ER via ems from [facility] with c/o fever and an episode of projectile vomiting.  Ems reports pt has hx of CVA with L side paralysis, and frequent UTI.  Pt is A&O to her baseline with hx of dementia.  Pt has DNR present on arrival.    EMS VS:  Temp 102 axillary  HR 120 BP 154/80

## 2023-10-21 ENCOUNTER — Encounter (HOSPITAL_COMMUNITY): Payer: Self-pay

## 2023-10-21 ENCOUNTER — Inpatient Hospital Stay (HOSPITAL_COMMUNITY)
Admission: EM | Admit: 2023-10-21 | Discharge: 2023-10-27 | DRG: 871 | Disposition: A | Discharge status: Skilled Nursing Facility | Payer: Medicare Other | Source: Other Acute Inpatient Hospital | Attending: Internal Medicine | Admitting: Internal Medicine

## 2023-10-21 ENCOUNTER — Inpatient Hospital Stay: Payer: Medicare Other

## 2023-10-21 DIAGNOSIS — Z90710 Acquired absence of both cervix and uterus: Secondary | ICD-10-CM

## 2023-10-21 DIAGNOSIS — Z85828 Personal history of other malignant neoplasm of skin: Secondary | ICD-10-CM | POA: Diagnosis not present

## 2023-10-21 DIAGNOSIS — G9341 Metabolic encephalopathy: Secondary | ICD-10-CM | POA: Diagnosis present

## 2023-10-21 DIAGNOSIS — R748 Abnormal levels of other serum enzymes: Secondary | ICD-10-CM | POA: Diagnosis present

## 2023-10-21 DIAGNOSIS — E039 Hypothyroidism, unspecified: Secondary | ICD-10-CM | POA: Diagnosis present

## 2023-10-21 DIAGNOSIS — A419 Sepsis, unspecified organism: Principal | ICD-10-CM | POA: Diagnosis present

## 2023-10-21 DIAGNOSIS — I700 Atherosclerosis of aorta: Secondary | ICD-10-CM | POA: Diagnosis present

## 2023-10-21 DIAGNOSIS — B9689 Other specified bacterial agents as the cause of diseases classified elsewhere: Secondary | ICD-10-CM | POA: Diagnosis present

## 2023-10-21 DIAGNOSIS — K5900 Constipation, unspecified: Secondary | ICD-10-CM | POA: Diagnosis present

## 2023-10-21 DIAGNOSIS — A4151 Sepsis due to Escherichia coli [E. coli]: Secondary | ICD-10-CM | POA: Diagnosis present

## 2023-10-21 DIAGNOSIS — F01511 Vascular dementia, unspecified severity, with agitation: Secondary | ICD-10-CM | POA: Diagnosis not present

## 2023-10-21 DIAGNOSIS — K8021 Calculus of gallbladder without cholecystitis with obstruction: Secondary | ICD-10-CM

## 2023-10-21 DIAGNOSIS — M8580 Other specified disorders of bone density and structure, unspecified site: Secondary | ICD-10-CM | POA: Diagnosis present

## 2023-10-21 DIAGNOSIS — I69354 Hemiplegia and hemiparesis following cerebral infarction affecting left non-dominant side: Secondary | ICD-10-CM | POA: Diagnosis not present

## 2023-10-21 DIAGNOSIS — Z883 Allergy status to other anti-infective agents status: Secondary | ICD-10-CM

## 2023-10-21 DIAGNOSIS — H9190 Unspecified hearing loss, unspecified ear: Secondary | ICD-10-CM | POA: Diagnosis present

## 2023-10-21 DIAGNOSIS — Z79890 Hormone replacement therapy (postmenopausal): Secondary | ICD-10-CM | POA: Diagnosis not present

## 2023-10-21 DIAGNOSIS — F0150 Vascular dementia without behavioral disturbance: Secondary | ICD-10-CM | POA: Diagnosis present

## 2023-10-21 DIAGNOSIS — I69319 Unspecified symptoms and signs involving cognitive functions following cerebral infarction: Secondary | ICD-10-CM

## 2023-10-21 DIAGNOSIS — Z9889 Other specified postprocedural states: Secondary | ICD-10-CM

## 2023-10-21 DIAGNOSIS — R7989 Other specified abnormal findings of blood chemistry: Secondary | ICD-10-CM | POA: Diagnosis present

## 2023-10-21 DIAGNOSIS — R5081 Fever presenting with conditions classified elsewhere: Secondary | ICD-10-CM | POA: Diagnosis not present

## 2023-10-21 DIAGNOSIS — K5641 Fecal impaction: Secondary | ICD-10-CM | POA: Diagnosis present

## 2023-10-21 DIAGNOSIS — Z7983 Long term (current) use of bisphosphonates: Secondary | ICD-10-CM

## 2023-10-21 DIAGNOSIS — Z888 Allergy status to other drugs, medicaments and biological substances status: Secondary | ICD-10-CM

## 2023-10-21 DIAGNOSIS — N390 Urinary tract infection, site not specified: Secondary | ICD-10-CM | POA: Diagnosis present

## 2023-10-21 DIAGNOSIS — N183 Chronic kidney disease, stage 3 unspecified: Secondary | ICD-10-CM | POA: Diagnosis present

## 2023-10-21 DIAGNOSIS — D649 Anemia, unspecified: Secondary | ICD-10-CM | POA: Diagnosis present

## 2023-10-21 DIAGNOSIS — R5381 Other malaise: Secondary | ICD-10-CM | POA: Diagnosis present

## 2023-10-21 DIAGNOSIS — Z79899 Other long term (current) drug therapy: Secondary | ICD-10-CM

## 2023-10-21 DIAGNOSIS — Z1152 Encounter for screening for COVID-19: Secondary | ICD-10-CM | POA: Diagnosis not present

## 2023-10-21 DIAGNOSIS — M48061 Spinal stenosis, lumbar region without neurogenic claudication: Secondary | ICD-10-CM | POA: Diagnosis present

## 2023-10-21 DIAGNOSIS — B9620 Unspecified Escherichia coli [E. coli] as the cause of diseases classified elsewhere: Secondary | ICD-10-CM

## 2023-10-21 DIAGNOSIS — L89152 Pressure ulcer of sacral region, stage 2: Secondary | ICD-10-CM | POA: Diagnosis present

## 2023-10-21 DIAGNOSIS — R6521 Severe sepsis with septic shock: Secondary | ICD-10-CM | POA: Diagnosis present

## 2023-10-21 DIAGNOSIS — Z66 Do not resuscitate: Secondary | ICD-10-CM | POA: Diagnosis present

## 2023-10-21 DIAGNOSIS — Z8042 Family history of malignant neoplasm of prostate: Secondary | ICD-10-CM

## 2023-10-21 DIAGNOSIS — I129 Hypertensive chronic kidney disease with stage 1 through stage 4 chronic kidney disease, or unspecified chronic kidney disease: Secondary | ICD-10-CM | POA: Diagnosis present

## 2023-10-21 DIAGNOSIS — Z6835 Body mass index (BMI) 35.0-35.9, adult: Secondary | ICD-10-CM

## 2023-10-21 DIAGNOSIS — R7881 Bacteremia: Secondary | ICD-10-CM

## 2023-10-21 DIAGNOSIS — R7401 Elevation of levels of liver transaminase levels: Secondary | ICD-10-CM | POA: Diagnosis present

## 2023-10-21 DIAGNOSIS — K8689 Other specified diseases of pancreas: Secondary | ICD-10-CM | POA: Diagnosis present

## 2023-10-21 DIAGNOSIS — Z8249 Family history of ischemic heart disease and other diseases of the circulatory system: Secondary | ICD-10-CM

## 2023-10-21 DIAGNOSIS — Z9049 Acquired absence of other specified parts of digestive tract: Secondary | ICD-10-CM

## 2023-10-21 DIAGNOSIS — E785 Hyperlipidemia, unspecified: Secondary | ICD-10-CM | POA: Diagnosis present

## 2023-10-21 DIAGNOSIS — N182 Chronic kidney disease, stage 2 (mild): Secondary | ICD-10-CM | POA: Diagnosis present

## 2023-10-21 DIAGNOSIS — G9349 Other encephalopathy: Secondary | ICD-10-CM | POA: Diagnosis present

## 2023-10-21 DIAGNOSIS — E876 Hypokalemia: Secondary | ICD-10-CM | POA: Diagnosis present

## 2023-10-21 DIAGNOSIS — B964 Proteus (mirabilis) (morganii) as the cause of diseases classified elsewhere: Secondary | ICD-10-CM | POA: Diagnosis present

## 2023-10-21 DIAGNOSIS — K838 Other specified diseases of biliary tract: Secondary | ICD-10-CM

## 2023-10-21 DIAGNOSIS — K8033 Calculus of bile duct with acute cholangitis with obstruction: Secondary | ICD-10-CM | POA: Diagnosis present

## 2023-10-21 DIAGNOSIS — K219 Gastro-esophageal reflux disease without esophagitis: Secondary | ICD-10-CM | POA: Diagnosis present

## 2023-10-21 DIAGNOSIS — K8050 Calculus of bile duct without cholangitis or cholecystitis without obstruction: Secondary | ICD-10-CM

## 2023-10-21 DIAGNOSIS — N3000 Acute cystitis without hematuria: Secondary | ICD-10-CM | POA: Diagnosis not present

## 2023-10-21 DIAGNOSIS — R509 Fever, unspecified: Secondary | ICD-10-CM | POA: Diagnosis present

## 2023-10-21 DIAGNOSIS — E872 Acidosis, unspecified: Secondary | ICD-10-CM | POA: Diagnosis present

## 2023-10-21 DIAGNOSIS — Z7982 Long term (current) use of aspirin: Secondary | ICD-10-CM

## 2023-10-21 DIAGNOSIS — K8309 Other cholangitis: Secondary | ICD-10-CM | POA: Diagnosis not present

## 2023-10-21 DIAGNOSIS — Z823 Family history of stroke: Secondary | ICD-10-CM

## 2023-10-21 DIAGNOSIS — L8990 Pressure ulcer of unspecified site, unspecified stage: Secondary | ICD-10-CM | POA: Insufficient documentation

## 2023-10-21 DIAGNOSIS — Z7902 Long term (current) use of antithrombotics/antiplatelets: Secondary | ICD-10-CM

## 2023-10-21 DIAGNOSIS — F05 Delirium due to known physiological condition: Secondary | ICD-10-CM | POA: Diagnosis not present

## 2023-10-21 DIAGNOSIS — E669 Obesity, unspecified: Secondary | ICD-10-CM | POA: Diagnosis present

## 2023-10-21 DIAGNOSIS — Z90722 Acquired absence of ovaries, bilateral: Secondary | ICD-10-CM

## 2023-10-21 DIAGNOSIS — Z87440 Personal history of urinary (tract) infections: Secondary | ICD-10-CM

## 2023-10-21 DIAGNOSIS — M810 Age-related osteoporosis without current pathological fracture: Secondary | ICD-10-CM | POA: Diagnosis present

## 2023-10-21 DIAGNOSIS — Z833 Family history of diabetes mellitus: Secondary | ICD-10-CM

## 2023-10-21 LAB — CBC
HCT: 29.8 % — ABNORMAL LOW (ref 36.0–46.0)
Hemoglobin: 10 g/dL — ABNORMAL LOW (ref 12.0–15.0)
MCH: 31.5 pg (ref 26.0–34.0)
MCHC: 33.6 g/dL (ref 30.0–36.0)
MCV: 94 fL (ref 80.0–100.0)
Platelets: 383 10*3/uL (ref 150–400)
RBC: 3.17 MIL/uL — ABNORMAL LOW (ref 3.87–5.11)
RDW: 13.2 % (ref 11.5–15.5)
WBC: 17.7 10*3/uL — ABNORMAL HIGH (ref 4.0–10.5)
nRBC: 0 % (ref 0.0–0.2)

## 2023-10-21 LAB — BLOOD CULTURE ID PANEL (REFLEXED) - BCID2

## 2023-10-21 LAB — COMPREHENSIVE METABOLIC PANEL
ALT: 139 U/L — ABNORMAL HIGH (ref 0–44)
AST: 357 U/L — ABNORMAL HIGH (ref 15–41)
Albumin: 2.4 g/dL — ABNORMAL LOW (ref 3.5–5.0)
Alkaline Phosphatase: 284 U/L — ABNORMAL HIGH (ref 38–126)
Anion gap: 9 (ref 5–15)
BUN: 10 mg/dL (ref 8–23)
CO2: 24 mmol/L (ref 22–32)
Calcium: 8.1 mg/dL — ABNORMAL LOW (ref 8.9–10.3)
Chloride: 103 mmol/L (ref 98–111)
Creatinine, Ser: 0.76 mg/dL (ref 0.44–1.00)
GFR, Estimated: >60 mL/min (ref >60)
Glucose, Bld: 169 mg/dL — ABNORMAL HIGH (ref 70–99)
Potassium: 3.5 mmol/L (ref 3.5–5.1)
Sodium: 136 mmol/L (ref 135–145)
Total Bilirubin: 1.3 mg/dL — ABNORMAL HIGH (ref <1.2)
Total Protein: 5.7 g/dL — ABNORMAL LOW (ref 6.5–8.1)

## 2023-10-21 LAB — HEMOGLOBIN A1C
Hgb A1c MFr Bld: 5.8 % — ABNORMAL HIGH (ref 4.8–5.6)
Mean Plasma Glucose: 119.76 mg/dL

## 2023-10-21 LAB — HEPATITIS PANEL, ACUTE
HCV Ab: NONREACTIVE
Hep A IgM: NONREACTIVE
Hep B C IgM: NONREACTIVE
Hepatitis B Surface Ag: NONREACTIVE

## 2023-10-21 LAB — PROTIME-INR
INR: 1.3 — ABNORMAL HIGH (ref 0.8–1.2)
Prothrombin Time: 16.1 s — ABNORMAL HIGH (ref 11.4–15.2)

## 2023-10-21 LAB — MRSA NEXT GEN BY PCR, NASAL: MRSA by PCR Next Gen: NOT DETECTED

## 2023-10-21 LAB — ACETAMINOPHEN LEVEL: Acetaminophen (Tylenol), Serum: <10 ug/mL — ABNORMAL LOW (ref 10–30)

## 2023-10-21 LAB — PROCALCITONIN: Procalcitonin: 5.69 ng/mL

## 2023-10-21 LAB — TSH: TSH: 2.666 u[IU]/mL (ref 0.350–4.500)

## 2023-10-21 LAB — PHOSPHORUS: Phosphorus: 3.5 mg/dL (ref 2.5–4.6)

## 2023-10-21 LAB — LACTIC ACID, PLASMA
Lactic Acid, Venous: 2.7 mmol/L (ref 0.5–1.9)
Lactic Acid, Venous: 1.7 mmol/L (ref 0.5–1.9)

## 2023-10-21 LAB — GLUCOSE, CAPILLARY
Glucose-Capillary: 148 mg/dL — ABNORMAL HIGH (ref 70–99)
Glucose-Capillary: 77 mg/dL (ref 70–99)

## 2023-10-21 LAB — MAGNESIUM: Magnesium: 1.9 mg/dL (ref 1.7–2.4)

## 2023-10-21 LAB — CORTISOL-AM, BLOOD: Cortisol - AM: 10.4 ug/dL (ref 6.7–22.6)

## 2023-10-21 MED ORDER — ALBUMIN HUMAN 25 % IV SOLN
25.0000 g | Freq: Once | INTRAVENOUS | Status: AC
  Administered 2023-10-21: 25 g via INTRAVENOUS
  Filled 2023-10-21: qty 100

## 2023-10-21 MED ORDER — HEPARIN SODIUM (PORCINE) 5000 UNIT/ML IJ SOLN: 5000.0000 [IU] | Freq: Three times a day (TID) | INTRAMUSCULAR | Status: DC

## 2023-10-21 MED ORDER — MIDODRINE HCL 5 MG PO TABS
5.0000 mg | ORAL_TABLET | Freq: Three times a day (TID) | ORAL | Status: DC
  Administered 2023-10-22: 5 mg via ORAL
  Filled 2023-10-21: qty 1

## 2023-10-21 MED ORDER — LACTATED RINGERS IV SOLN: INTRAVENOUS | Status: AC

## 2023-10-21 MED ORDER — PANTOPRAZOLE SODIUM 40 MG PO TBEC
40.0000 mg | DELAYED_RELEASE_TABLET | Freq: Every day | ORAL | Status: DC
  Administered 2023-10-22 – 2023-10-27 (×5): 40 mg via ORAL
  Filled 2023-10-21 (×5): qty 1

## 2023-10-21 MED ORDER — MIDODRINE HCL 5 MG PO TABS
5.0000 mg | ORAL_TABLET | Freq: Three times a day (TID) | ORAL | Status: DC
  Administered 2023-10-21 (×3): 5 mg via ORAL
  Filled 2023-10-21 (×3): qty 1

## 2023-10-21 MED ORDER — MIRTAZAPINE 15 MG PO TABS
7.5000 mg | ORAL_TABLET | Freq: Every day | ORAL | Status: DC
  Administered 2023-10-22 – 2023-10-26 (×3): 7.5 mg via ORAL
  Filled 2023-10-21 (×3): qty 1

## 2023-10-21 MED ORDER — VANCOMYCIN HCL IN DEXTROSE 1-5 GM/200ML-% IV SOLN: 1000.0000 mg | INTRAVENOUS | Status: DC

## 2023-10-21 MED ORDER — DOCUSATE SODIUM 100 MG PO CAPS: 100.0000 mg | ORAL_CAPSULE | Freq: Two times a day (BID) | ORAL | Status: DC | PRN

## 2023-10-21 MED ORDER — POLYETHYLENE GLYCOL 3350 17 G PO PACK
17.0000 g | PACK | Freq: Every day | ORAL | Status: DC
  Administered 2023-10-22 – 2023-10-27 (×4): 17 g via ORAL
  Filled 2023-10-21 (×5): qty 1

## 2023-10-21 MED ORDER — ASPIRIN 81 MG PO TBEC
81.0000 mg | DELAYED_RELEASE_TABLET | Freq: Every day | ORAL | Status: DC
  Filled 2023-10-21: qty 1

## 2023-10-21 MED ORDER — QUETIAPINE FUMARATE 25 MG PO TABS
25.0000 mg | ORAL_TABLET | Freq: Every evening | ORAL | Status: DC
  Administered 2023-10-21: 25 mg via ORAL
  Filled 2023-10-21: qty 1

## 2023-10-21 MED ORDER — CARMEX CLASSIC LIP BALM EX OINT
TOPICAL_OINTMENT | CUTANEOUS | Status: DC | PRN
  Filled 2023-10-21: qty 10

## 2023-10-21 MED ORDER — CLOPIDOGREL BISULFATE 75 MG PO TABS: 75.0000 mg | ORAL_TABLET | Freq: Every day | ORAL | Status: DC

## 2023-10-21 MED ORDER — HEPARIN SODIUM (PORCINE) 5000 UNIT/ML IJ SOLN
5000.0000 [IU] | Freq: Two times a day (BID) | INTRAMUSCULAR | Status: DC
  Administered 2023-10-21 (×2): 5000 [IU] via SUBCUTANEOUS
  Filled 2023-10-21 (×2): qty 1

## 2023-10-21 MED ORDER — CHLORHEXIDINE GLUCONATE CLOTH 2 % EX PADS
6.0000 | MEDICATED_PAD | Freq: Every day | CUTANEOUS | Status: DC
  Administered 2023-10-21 – 2023-10-26 (×6): 6 via TOPICAL

## 2023-10-21 MED ORDER — SODIUM CHLORIDE 0.9 % IV SOLN: 250.0000 mL | INTRAVENOUS | Status: DC

## 2023-10-21 MED ORDER — ORAL CARE MOUTH RINSE: 15.0000 mL | OROMUCOSAL | Status: DC | PRN

## 2023-10-21 MED ORDER — TRAZODONE HCL 50 MG PO TABS: 50.0000 mg | ORAL_TABLET | Freq: Every evening | ORAL | Status: DC | PRN

## 2023-10-21 MED ORDER — IPRATROPIUM-ALBUTEROL 0.5-2.5 (3) MG/3ML IN SOLN: 3.0000 mL | RESPIRATORY_TRACT | Status: DC | PRN

## 2023-10-21 MED ORDER — MIRTAZAPINE 15 MG PO TABS: 7.5000 mg | ORAL_TABLET | Freq: Every day | ORAL | Status: DC

## 2023-10-21 MED ORDER — HYDRALAZINE HCL 20 MG/ML IJ SOLN: 10.0000 mg | INTRAMUSCULAR | Status: DC | PRN

## 2023-10-21 MED ORDER — VITAMIN D 25 MCG (1000 UNIT) PO TABS
1000.0000 [IU] | ORAL_TABLET | Freq: Every day | ORAL | Status: DC
  Administered 2023-10-22 – 2023-10-27 (×5): 1000 [IU] via ORAL
  Filled 2023-10-21 (×5): qty 1

## 2023-10-21 MED ORDER — PANTOPRAZOLE SODIUM 40 MG PO TBEC
40.0000 mg | DELAYED_RELEASE_TABLET | Freq: Every day | ORAL | Status: DC
  Administered 2023-10-21: 40 mg via ORAL
  Filled 2023-10-21: qty 1

## 2023-10-21 MED ORDER — ONDANSETRON HCL 4 MG/2ML IJ SOLN: 4.0000 mg | Freq: Four times a day (QID) | INTRAMUSCULAR | Status: DC | PRN

## 2023-10-21 MED ORDER — CLOPIDOGREL BISULFATE 75 MG PO TABS
75.0000 mg | ORAL_TABLET | Freq: Every day | ORAL | Status: DC
  Administered 2023-10-21: 75 mg via ORAL
  Filled 2023-10-21: qty 1

## 2023-10-21 MED ORDER — ORAL CARE MOUTH RINSE
15.0000 mL | OROMUCOSAL | Status: DC
  Administered 2023-10-21 (×2): 15 mL via OROMUCOSAL

## 2023-10-21 MED ORDER — QUETIAPINE FUMARATE 25 MG PO TABS
25.0000 mg | ORAL_TABLET | Freq: Every evening | ORAL | Status: DC
  Administered 2023-10-22 – 2023-10-26 (×5): 25 mg via ORAL
  Filled 2023-10-21 (×5): qty 1

## 2023-10-21 MED ORDER — GADOBUTROL 1 MMOL/ML IV SOLN
6.0000 mL | Freq: Once | INTRAVENOUS | Status: AC | PRN
  Administered 2023-10-21: 6 mL via INTRAVENOUS

## 2023-10-21 MED ORDER — PIPERACILLIN-TAZOBACTAM 3.375 G IVPB
3.3750 g | Freq: Three times a day (TID) | INTRAVENOUS | Status: DC
  Administered 2023-10-22 – 2023-10-24 (×8): 3.375 g via INTRAVENOUS
  Filled 2023-10-21 (×8): qty 50

## 2023-10-21 MED ORDER — ALBUMIN HUMAN 25 % IV SOLN
12.5000 g | Freq: Once | INTRAVENOUS | Status: AC
  Administered 2023-10-22: 12.5 g via INTRAVENOUS
  Filled 2023-10-21: qty 50

## 2023-10-21 MED ORDER — GABAPENTIN 100 MG PO CAPS
200.0000 mg | ORAL_CAPSULE | Freq: Every morning | ORAL | Status: DC
  Administered 2023-10-22 – 2023-10-27 (×5): 200 mg via ORAL
  Filled 2023-10-21 (×5): qty 2

## 2023-10-21 MED ORDER — VITAMIN B-12 1000 MCG PO TABS
1000.0000 ug | ORAL_TABLET | Freq: Every day | ORAL | Status: DC
  Administered 2023-10-22 – 2023-10-27 (×5): 1000 ug via ORAL
  Filled 2023-10-21 (×5): qty 1

## 2023-10-21 MED ORDER — GUAIFENESIN 100 MG/5ML PO LIQD: 5.0000 mL | ORAL | Status: DC | PRN

## 2023-10-21 MED ORDER — ORAL CARE MOUTH RINSE
15.0000 mL | OROMUCOSAL | Status: DC
  Administered 2023-10-22 – 2023-10-27 (×14): 15 mL via OROMUCOSAL

## 2023-10-21 MED ORDER — NOREPINEPHRINE 4 MG/250ML-% IV SOLN
2.0000 ug/min | INTRAVENOUS | Status: DC
  Administered 2023-10-22: 2 ug/min via INTRAVENOUS

## 2023-10-21 MED ORDER — ASPIRIN 81 MG PO TBEC
81.0000 mg | DELAYED_RELEASE_TABLET | Freq: Every day | ORAL | Status: DC
  Administered 2023-10-21: 81 mg via ORAL
  Filled 2023-10-21: qty 1

## 2023-10-21 MED ORDER — NOREPINEPHRINE 4 MG/250ML-% IV SOLN
2.0000 ug/min | INTRAVENOUS | Status: DC
  Administered 2023-10-21: 3 ug/min via INTRAVENOUS
  Administered 2023-10-21: 2 ug/min via INTRAVENOUS
  Filled 2023-10-21 (×2): qty 250

## 2023-10-21 MED ORDER — DONEPEZIL HCL 5 MG PO TABS
5.0000 mg | ORAL_TABLET | Freq: Every day | ORAL | Status: DC
  Administered 2023-10-22 – 2023-10-26 (×3): 5 mg via ORAL
  Filled 2023-10-21 (×5): qty 1

## 2023-10-21 MED ORDER — LEVOTHYROXINE SODIUM 50 MCG PO TABS
50.0000 ug | ORAL_TABLET | Freq: Every day | ORAL | Status: DC
  Administered 2023-10-23 – 2023-10-27 (×5): 50 ug via ORAL
  Filled 2023-10-21 (×4): qty 1
  Filled 2023-10-21: qty 2

## 2023-10-21 MED ORDER — LACTATED RINGERS IV SOLN
150.0000 mL/h | INTRAVENOUS | Status: DC
  Administered 2023-10-21: 150 mL/h via INTRAVENOUS

## 2023-10-21 MED ORDER — SENNOSIDES-DOCUSATE SODIUM 8.6-50 MG PO TABS: 1.0000 | ORAL_TABLET | Freq: Every evening | ORAL | Status: DC | PRN

## 2023-10-21 MED ORDER — LEVOTHYROXINE SODIUM 50 MCG PO TABS: 50.0000 ug | ORAL_TABLET | Freq: Every day | ORAL | Status: DC

## 2023-10-21 MED ORDER — CHLORHEXIDINE GLUCONATE CLOTH 2 % EX PADS
6.0000 | MEDICATED_PAD | Freq: Every day | CUTANEOUS | Status: DC
  Administered 2023-10-21: 6 via TOPICAL

## 2023-10-21 MED ORDER — POLYETHYLENE GLYCOL 3350 17 G PO PACK
17.0000 g | PACK | Freq: Every day | ORAL | Status: DC | PRN
Start: 2023-10-21 — End: 2023-10-27

## 2023-10-21 MED ORDER — ACETAMINOPHEN 650 MG RE SUPP: 650.0000 mg | Freq: Four times a day (QID) | RECTAL | Status: DC | PRN

## 2023-10-21 MED ORDER — HEPARIN SODIUM (PORCINE) 5000 UNIT/ML IJ SOLN
5000.0000 [IU] | Freq: Three times a day (TID) | INTRAMUSCULAR | Status: DC
  Administered 2023-10-22 – 2023-10-23 (×5): 5000 [IU] via SUBCUTANEOUS
  Filled 2023-10-21 (×5): qty 1

## 2023-10-21 MED ORDER — LACTATED RINGERS IV BOLUS
500.0000 mL | Freq: Once | INTRAVENOUS | Status: AC
  Administered 2023-10-21: 500 mL via INTRAVENOUS

## 2023-10-21 MED ORDER — VANCOMYCIN HCL 1500 MG/300ML IV SOLN
1500.0000 mg | Freq: Once | INTRAVENOUS | Status: AC
Start: 2023-10-21 — End: 2023-10-21
  Administered 2023-10-21: 1500 mg via INTRAVENOUS
  Filled 2023-10-21: qty 300

## 2023-10-21 MED ORDER — GABAPENTIN 100 MG PO CAPS: 100.0000 mg | ORAL_CAPSULE | Freq: Three times a day (TID) | ORAL | Status: DC

## 2023-10-21 MED ORDER — BUDESONIDE 0.5 MG/2ML IN SUSP
0.5000 mg | Freq: Two times a day (BID) | RESPIRATORY_TRACT | Status: DC
  Administered 2023-10-21: 0.5 mg via RESPIRATORY_TRACT
  Filled 2023-10-21: qty 2

## 2023-10-21 MED ORDER — PIPERACILLIN-TAZOBACTAM 3.375 G IVPB
3.3750 g | Freq: Three times a day (TID) | INTRAVENOUS | Status: DC
  Administered 2023-10-21 (×2): 3.375 g via INTRAVENOUS
  Filled 2023-10-21 (×2): qty 50

## 2023-10-21 MED ORDER — ACETAMINOPHEN 325 MG PO TABS: 650.0000 mg | ORAL_TABLET | Freq: Four times a day (QID) | ORAL | Status: DC | PRN

## 2023-10-21 MED ORDER — DONEPEZIL HCL 5 MG PO TABS
5.0000 mg | ORAL_TABLET | Freq: Every day | ORAL | Status: DC
  Administered 2023-10-21: 5 mg via ORAL
  Filled 2023-10-21: qty 1

## 2023-10-21 NOTE — Assessment & Plan Note (Addendum)
Urinalysis    Component Value Date/Time   COLORURINE AMBER (A) 10/20/2023 2044   APPEARANCEUR TURBID (A) 10/20/2023 2044   APPEARANCEUR Clear 01/11/2023 1029   LABSPEC 1.010 10/20/2023 2044   PHURINE 7.0 10/20/2023 2044   GLUCOSEU NEGATIVE 10/20/2023 2044   HGBUR SMALL (A) 10/20/2023 2044   BILIRUBINUR NEGATIVE 10/20/2023 2044   BILIRUBINUR Negative 01/11/2023 1029   KETONESUR NEGATIVE 10/20/2023 2044   PROTEINUR NEGATIVE 10/20/2023 2044   NITRITE POSITIVE (A) 10/20/2023 2044   LEUKOCYTESUR LARGE (A) 10/20/2023 2044  Continue with rocephin and follow culture/ sensitivity.

## 2023-10-21 NOTE — ED Notes (Signed)
Report called to mirah rn icu nurse.

## 2023-10-21 NOTE — Progress Notes (Signed)
Report given to [facility] transport team. All needed documentation provided. Family at bedside and aware of transport. Patient resting and understands transport. Medications reviewed for transport. Levophed increased to and noted in [facility]. Vitals stable at this time. Handoff complete.

## 2023-10-21 NOTE — Assessment & Plan Note (Addendum)
    Latest Ref Rng & Units 10/20/2023    8:44 PM 06/06/2023    4:47 AM 05/17/2023    3:24 PM  Hepatic Function  Total Protein 6.5 - 8.1 g/dL 6.8  6.4  7.1   Albumin 3.5 - 5.0 g/dL 2.5  2.9  3.9   AST 15 - 41 U/L 431  43  16   ALT 0 - 44 U/L 123  38  18   Alk Phosphatase 38 - 126 U/L 309  51  73   Total Bilirubin <1.2 mg/dL 1.1  0.5  0.3   Suspect 2/2 to hypoperfusion and shock liver , imaging also however states pt has  IMPRESSION: 1. Moderate stool distention of the rectum with mild rectal wall thickening and perirectal edema, suspicious for stercoral colitis. 2. Moderate volume of stool throughout the colon, constipation suspected. Colonic diverticulosis without diverticulitis. 3. Intra and extrahepatic biliary ductal dilatation, likely due to post cholecystectomy state. Recommend correlation with liver function tests. If LFTs are normal, no further workup is recommended. If LFTs are elevated, MRCP is the workup of choice, however not recommended in this patient who cannot hold still for CT. 4. Small hiatal hernia with fluid in the distal esophagus, can be seen with gastroesophageal reflux.   Aortic Atherosclerosis (ICD10-I70.0).

## 2023-10-21 NOTE — Assessment & Plan Note (Addendum)
Lab Results  Component Value Date   CREATININE 0.81 10/20/2023   CREATININE 1.00 06/09/2023   CREATININE 1.00 06/06/2023  Stable, will monitor and follow for any worsening.

## 2023-10-21 NOTE — Hospital Course (Addendum)
  Brief Narrative:  89 year old with history of CVA left-sided hemiparesis, HLD, cognitive impairment/dementia sent from the facility for fever and vomiting.  Patient is a very poor historian.  Upon admission noted to be in septic shock secondary to UTI resuscitated with IV fluids, started on IV antibiotics.  Did have transaminitis attributed to likely hypotension.  CT abdomen showed intrahepatic extrabiliary ductal dilation.   Assessment & Plan:  Principal Problem:   Fever Active Problems:   Hypothyroidism   GERD (gastroesophageal reflux disease)   UTI (urinary tract infection)   Chronic kidney disease, stage 3 (HCC)   HTN (hypertension)   Septic shock (HCC)   Elevated LFTs   Hemiparesis affecting left side as late effect of cerebrovascular accident (CVA) (HCC)   Severe septic shock secondary to urinary tract infection - Sepsis and shock physiology slowly improving.  Follow culture data, tailor antibiotics accordingly.  Fluid as needed.  Wean off Levophed, started midodrine  Transaminitis History of cholecystectomy - Suspect from shock liver.  LFTs slowly improving.  CT abdomen pelvis does show ductal dilation likely from previous cholecystectomy.  GI recommending MRCP  Constipation with some sterile colitis - Stool softeners.  Hypothyroidism - Synthroid   Essential hypertension -Currently on hold due to hypotension  History of CVA with left-sided hemiparesis -Chronic.  On aspirin and Plavix  DVT prophylaxis: heparin injection 5,000 Units Start: 10/21/23 0115   Code Status: DNR Family Communication: Son at bedside Status is: Inpatient Remains inpatient appropriate because: Continue ICU stay until weaned off Levophed    Subjective: Resting comfortably this morning.  Soft blood pressure requiring Levophed.  Now started on midodrine.   Examination:  General exam: Appears calm and comfortable, chronically ill Respiratory system: Decreased breath sounds at the  bases Cardiovascular system: S1 & S2 heard, RRR. No JVD, murmurs, rubs, gallops or clicks. No pedal edema. Gastrointestinal system: Abdomen is nondistended, soft and nontender. No organomegaly or masses felt. Normal bowel sounds heard. Central nervous system: Alert and oriented.  Chronic left-sided deficit Extremities: Symmetric 4 x 5 power. Skin: No rashes, lesions or ulcers

## 2023-10-21 NOTE — Progress Notes (Signed)
Pharmacy Antibiotic Note  Sarah Freeman is a 89 y.o. female admitted on 10/20/2023 with sepsis.  Pharmacy has been consulted for Vancomycin, Zosyn  dosing.  Plan: Zosyn 3.375g IV q8h (4 hour infusion).  Vancomycin 1500 mg IV X 1 ordered for 11/22 @ ~ 0400. Vancomycin 1 gm IV Q48H ordered to start on 11/24 @ 0400.  AUC = 566.3 Vanc trough = 12.0   Weight: 67 kg (147 lb 11.3 oz)  Temp (24hrs), Avg:100.2 F (37.9 C), Min:98.2 F (36.8 C), Max:102.7 F (39.3 C)  Recent Labs  Lab 10/20/23 2044 10/20/23 2305 10/21/23 0238  WBC 20.0*  --   --   CREATININE 0.81  --   --   LATICACIDVEN 2.5* 4.1* 2.7*    Estimated Creatinine Clearance: 35.4 mL/min (by C-G formula based on SCr of 0.81 mg/dL).    Allergies  Allergen Reactions   Statins Other (See Comments)    Leg cramps    Bacitracin Other (See Comments)    Doesn't help wound to heal   Neomycin-Polymyxin-Gramicidin Other (See Comments)    Pt states wounds do not heal with neosporin     Antimicrobials this admission:   >>    >>   Dose adjustments this admission:   Microbiology results:  BCx:   UCx:    Sputum:    MRSA PCR:   Thank you for allowing pharmacy to be a part of this patient's care.  Alvester Eads D 10/21/2023 3:17 AM

## 2023-10-21 NOTE — Assessment & Plan Note (Addendum)
Sepsis 2/2 Uti and associated with lactic acidosis and elevated LFT. We will continue iv abx- rocephin.  We will also cont with azithromycin until pt is stable . Pt has started to receive IVF delayed due to lack of iv access issue per report.  Vitals:   10/20/23 2045 10/20/23 2240 10/20/23 2335 10/20/23 2340  BP: (!) 119/42 117/63 (!) 94/52 (!) 78/50   10/21/23 0000 10/21/23 0040 10/21/23 0100  BP: (!) 81/50 (!) 78/50 (!) 78/48  No intake or output data in the 24 hours ending 10/21/23 0110  azithromycin Stopped (10/20/23 2338)   cefTRIAXone (ROCEPHIN)  IV Stopped (10/20/23 2238)   lactated ringers 150 mL/hr at 10/20/23 2118   lactated ringers

## 2023-10-21 NOTE — Assessment & Plan Note (Addendum)
PRN tylenol and cooling blanket as needed.

## 2023-10-21 NOTE — H&P (Addendum)
History and Physical    Patient: Sarah Freeman ZOX:[id number] DOB: 06-Jul-1931 DOA: 10/20/2023 DOS: the patient was seen and examined on 10/21/2023 PCP: Dorcas Carrow, DO  Patient coming from: SNF  Chief Complaint:  Chief Complaint  Patient presents with   Fever   Emesis    HPI: Sarah Freeman is a 89 y.o. female with medical history significant for hypertension, history of CVAs with left-sided hemiparesis, hyperlipidemia, mild cognitive impairment question dementia history of vascular dementia possibly, coming to [place] from facility for fever and vomiting.  Patient is awake cooperative somewhat oriented to herself and location but is not able to give in detail hpi, she does agree when I said that she has a fever she is like yes I have a fever.  Family at bedside reports that patient had 2 strokes and does have history of urinary tract infections.  In emergency room vitals trend shows: Severe sepsis with septic shock patient hypotensive at bedside initial evaluation discussed with ED provider and nurse and patient started on IV fluid also reached out to ICU APP to monitor patient blood pressure and may need vasopressor therapy if blood pressure does not correct along with lactic acid levels.  Blood and urine cultures obtained.code status DNR added per yellow paper. Vitals:   10/20/23 2353 10/21/23 0000 10/21/23 0040 10/21/23 0100  BP:  (!) 81/50 (!) 78/50 (!) 78/48  Pulse:  (!) 112 (!) 101 (!) 104  Temp: 99.6 F (37.6 C)     Resp:  (!) 26 (!) 21 (!) 23  Weight:      SpO2:  96% 92% 93%  TempSrc: Axillary     EKG shows sinus tachycardia at 127 with no ST-T wave changes noted. Metabolic panel shows hyponatremia of 134 glucose 156 normal kidney function. LFTs added on shows alk phos of 309 albumin 2.5 AST of 431 ALT of 123 total protein 6.8 total bili 1.1.    Latest Ref Rng & Units 10/20/2023    8:44 PM 06/06/2023    4:47 AM 05/17/2023    3:24 PM  Hepatic Function  Total Protein  6.5 - 8.1 g/dL 6.8  6.4  7.1   Albumin 3.5 - 5.0 g/dL 2.5  2.9  3.9   AST 15 - 41 U/L 431  43  16   ALT 0 - 44 U/L 123  38  18   Alk Phosphatase 38 - 126 U/L 309  51  73   Total Bilirubin <1.2 mg/dL 1.1  0.5  0.3   CBC shows leukocytosis of 20, hemoglobin of 12 and platelet count of 475. Panel negative for COVID and RSV. Abnormal urinalysis with large leukocytes positive nitrite more than 50 WBCs and 6-10 RBCs with many bacteria. Abnormal CT imaging of the abdomen and pelvis intrahepatic extrahepatic biliary ductal dilatation with elevated LFTs and recommendation for MRCP,, constipation and stercoral colitis, small hiatal hernia, reflux. Chest x-ray shows low lung volumes but without any airspace disease effusion or pneumothorax.  In the ED pt received: Medications  lactated ringers infusion ( Intravenous New Bag/Given 10/20/23 2118)  cefTRIAXone (ROCEPHIN) 2 g in sodium chloride 0.9 % 100 mL IVPB (0 g Intravenous Stopped 10/20/23 2238)  azithromycin (ZITHROMAX) 500 mg in sodium chloride 0.9 % 250 mL IVPB (0 mg Intravenous Stopped 10/20/23 2338)  lactated ringers bolus 1,000 mL (1,000 mLs Intravenous New Bag/Given 10/21/23 0010)  aspirin EC tablet 81 mg (has no administration in time range)  clopidogrel (PLAVIX) tablet 75 mg (has  no administration in time range)  donepezil (ARICEPT) tablet 5 mg (has no administration in time range)  gabapentin (NEURONTIN) capsule 100 mg (has no administration in time range)  levothyroxine (SYNTHROID) tablet 50 mcg (has no administration in time range)  mirtazapine (REMERON) tablet 7.5 mg (has no administration in time range)  pantoprazole (PROTONIX) EC tablet 40 mg (has no administration in time range)  QUEtiapine (SEROQUEL) tablet 25 mg (has no administration in time range)  lactated ringers infusion (has no administration in time range)  acetaminophen (TYLENOL) tablet 650 mg (has no administration in time range)    Or  acetaminophen (TYLENOL)  suppository 650 mg (has no administration in time range)  heparin injection 5,000 Units (has no administration in time range)  acetaminophen (TYLENOL) tablet 1,000 mg (1,000 mg Oral Given 10/20/23 2234)  iohexol (OMNIPAQUE) 300 MG/ML solution 100 mL (100 mLs Intravenous Contrast Given 10/20/23 2209)  lactated ringers bolus 1,000 mL (1,000 mLs Intravenous New Bag/Given 10/21/23 0024)   Review of Systems  Unable to perform ROS: Acuity of condition   Past Medical History:  Diagnosis Date   Hyperlipidemia    Hypothyroid    Stroke [facility])    Urinary incontinence    Past Surgical History:  Procedure Laterality Date   BREAST BIOPSY Left 2000?   neg. dr. Rutherford Nail office   CHOLECYSTECTOMY  1964   COLONOSCOPY     Dr Maryruth Bun   DILATION AND CURETTAGE OF UTERUS     EYE SURGERY     LOOP RECORDER INSERTION N/A 02/09/2018   Procedure: LOOP RECORDER INSERTION;  Surgeon: Duke Salvia, MD;  Location: [facility] INVASIVE CV LAB;  Service: Cardiovascular;  Laterality: N/A;   SKIN CANCER EXCISION     face   TEE WITHOUT CARDIOVERSION N/A 02/09/2018   Procedure: TRANSESOPHAGEAL ECHOCARDIOGRAM (TEE);  Surgeon: Antonieta Iba, MD;  Location: [facility] ORS;  Service: Cardiovascular;  Laterality: N/A;   TOTAL ABDOMINAL HYSTERECTOMY W/ BILATERAL SALPINGOOPHORECTOMY     age 57    reports that she has never smoked. She has never been exposed to tobacco smoke. She has never used smokeless tobacco. She reports that she does not drink alcohol and does not use drugs.  Allergies  Allergen Reactions   Statins Other (See Comments)    Leg cramps    Bacitracin Other (See Comments)    Doesn't help wound to heal   Neomycin-Polymyxin-Gramicidin Other (See Comments)    Pt states wounds do not heal with neosporin     Family History  Problem Relation Age of Onset   Cancer Father        prostate   Stroke Father    Hypertension Sister    Hyperlipidemia Daughter    Heart disease Son    Diabetes Maternal Grandmother     Heart disease Maternal Grandmother    Heart disease Maternal Grandfather    Heart disease Daughter    CAD Mother    Failure to thrive Mother    Breast cancer Neg Hx     Prior to Admission medications   Medication Sig Start Date End Date Taking? Authorizing Provider  alendronate (FOSAMAX) 70 MG/75ML solution Take 70 mg by mouth every 7 (seven) days. 08/22/23   [provider]  aspirin EC 81 MG tablet Take 81 mg by mouth daily. Swallow whole.    [provider]  cholecalciferol (VITAMIN D3) 25 MCG (1000 UNIT) tablet Take 1,000 Units by mouth daily.    [provider]  clopidogrel (PLAVIX) 75  MG tablet Take 75 mg by mouth daily. 10/11/23   [provider]  cyanocobalamin 1000 MCG tablet Take 1 tablet (1,000 mcg total) by mouth daily. 04/19/23   Amin, Ankit C, MD  cyclobenzaprine (FLEXERIL) 5 MG tablet Take 5 mg by mouth 3 (three) times daily as needed. 09/22/23   [provider]  donepezil (ARICEPT) 5 MG tablet Take 1 tablet (5 mg total) by mouth at bedtime. Patient taking differently: Take 5 mg by mouth daily. 01/04/23   Johnson, Megan P, DO  gabapentin (NEURONTIN) 100 MG capsule TAKE 2 CAPSULES BY MOUTH EVERY MORNING AND 1 CAPSULE AT 1 PM 07/05/23   Johnson, Megan P, DO  levothyroxine (SYNTHROID) 50 MCG tablet Take 1 tablet (50 mcg total) by mouth daily before breakfast. 01/10/23   Laural Benes, Megan P, DO  mirtazapine (REMERON) 7.5 MG tablet Take 7.5 mg by mouth at bedtime. 09/30/23   [provider]  nitrofurantoin, macrocrystal-monohydrate, (MACROBID) 100 MG capsule Take 1 capsule (100 mg total) by mouth at bedtime. 11/08/22   Alfredo Martinez, MD  pantoprazole (PROTONIX) 40 MG tablet Take 1 tablet (40 mg total) by mouth daily. 05/17/23   Johnson, Megan P, DO  polyethylene glycol (MIRALAX / GLYCOLAX) 17 g packet Take 17 g by mouth daily.    [provider]  PROLIA 60 MG/ML SOSY injection Inject 60 mg into the skin every 6 (six) months.  05/22/20   [provider]  QUEtiapine (SEROQUEL) 25 MG tablet Take 0.5-1 tablets (12.5-25 mg total) by mouth every evening. Patient taking differently: Take 25 mg by mouth every evening. 05/17/23   Johnson, Megan P, DO  simvastatin (ZOCOR) 20 MG tablet Take 1 tablet (20 mg total) by mouth daily at 6 PM. Patient taking differently: Take 20 mg by mouth daily. 05/17/23   Olevia Perches P, DO     Vitals:   10/20/23 2353 10/21/23 0000 10/21/23 0040 10/21/23 0100  BP:  (!) 81/50 (!) 78/50 (!) 78/48  Pulse:  (!) 112 (!) 101 (!) 104  Resp:  (!) 26 (!) 21 (!) 23  Temp: 99.6 F (37.6 C)     TempSrc: Axillary     SpO2:  96% 92% 93%  Weight:       Physical Exam Constitutional:      General: She is not in acute distress.    Appearance: She is obese.  HENT:     Head: Normocephalic and atraumatic.     Mouth/Throat:     Mouth: Mucous membranes are dry.  Eyes:     Extraocular Movements: Extraocular movements intact.     Pupils: Pupils are equal, round, and reactive to light.  Cardiovascular:     Rate and Rhythm: Regular rhythm. Tachycardia present.     Pulses: Normal pulses.     Heart sounds: Normal heart sounds.  Pulmonary:     Effort: Pulmonary effort is normal.     Breath sounds: Normal breath sounds.  Abdominal:     General: Bowel sounds are normal. There is no distension.     Palpations: Abdomen is soft.     Tenderness: There is no abdominal tenderness. There is no guarding.  Musculoskeletal:     Right lower leg: No edema.     Left lower leg: No edema.  Skin:    General: Skin is warm.  Neurological:     Mental Status: She is alert.     Motor: Weakness present.      Labs on Admission: I have personally  reviewed following labs and imaging studies Results for orders placed or performed during the hospital encounter of 10/20/23 (from the past 24 hour(s))  Comprehensive metabolic panel     Status: Abnormal   Collection Time: 10/20/23  8:44 PM  Result Value Ref Range    Sodium 134 (L) 135 - 145 mmol/L   Potassium 3.8 3.5 - 5.1 mmol/L   Chloride 98 98 - 111 mmol/L   CO2 26 22 - 32 mmol/L   Glucose, Bld 156 (H) 70 - 99 mg/dL   BUN 10 8 - 23 mg/dL   Creatinine, Ser 1.61 0.44 - 1.00 mg/dL   Calcium 8.2 (L) 8.9 - 10.3 mg/dL   Total Protein 6.8 6.5 - 8.1 g/dL   Albumin 2.5 (L) 3.5 - 5.0 g/dL   AST 096 (H) 15 - 41 U/L   ALT 123 (H) 0 - 44 U/L   Alkaline Phosphatase 309 (H) 38 - 126 U/L   Total Bilirubin 1.1 <1.2 mg/dL   GFR, Estimated >04 >54 mL/min   Anion gap 10 5 - 15  Lactic acid, plasma     Status: Abnormal   Collection Time: 10/20/23  8:44 PM  Result Value Ref Range   Lactic Acid, Venous 2.5 (HH) 0.5 - 1.9 mmol/L  CBC with Differential     Status: Abnormal   Collection Time: 10/20/23  8:44 PM  Result Value Ref Range   WBC 20.0 (H) 4.0 - 10.5 K/uL   RBC 3.80 (L) 3.87 - 5.11 MIL/uL   Hemoglobin 12.0 12.0 - 15.0 g/dL   HCT 09.8 11.9 - 14.7 %   MCV 97.4 80.0 - 100.0 fL   MCH 31.6 26.0 - 34.0 pg   MCHC 32.4 30.0 - 36.0 g/dL   RDW 82.9 56.2 - 13.0 %   Platelets 475 (H) 150 - 400 K/uL   nRBC 0.0 0.0 - 0.2 %   Neutrophils Relative % 74 %   Neutro Abs 14.8 (H) 1.7 - 7.7 K/uL   Lymphocytes Relative 17 %   Lymphs Abs 3.5 0.7 - 4.0 K/uL   Monocytes Relative 7 %   Monocytes Absolute 1.3 (H) 0.1 - 1.0 K/uL   Eosinophils Relative 1 %   Eosinophils Absolute 0.3 0.0 - 0.5 K/uL   Basophils Relative 1 %   Basophils Absolute 0.1 0.0 - 0.1 K/uL   Immature Granulocytes 0 %   Abs Immature Granulocytes 0.07 0.00 - 0.07 K/uL  Protime-INR     Status: None   Collection Time: 10/20/23  8:44 PM  Result Value Ref Range   Prothrombin Time 14.4 11.4 - 15.2 seconds   INR 1.1 0.8 - 1.2  Urinalysis, w/ Reflex to Culture (Infection Suspected) -Urine, Catheterized     Status: Abnormal   Collection Time: 10/20/23  8:44 PM  Result Value Ref Range   Specimen Source URINE, CATHETERIZED    Color, Urine AMBER (A) YELLOW   APPearance TURBID (A) CLEAR   Specific Gravity,  Urine 1.010 1.005 - 1.030   pH 7.0 5.0 - 8.0   Glucose, UA NEGATIVE NEGATIVE mg/dL   Hgb urine dipstick SMALL (A) NEGATIVE   Bilirubin Urine NEGATIVE NEGATIVE   Ketones, ur NEGATIVE NEGATIVE mg/dL   Protein, ur NEGATIVE NEGATIVE mg/dL   Nitrite POSITIVE (A) NEGATIVE   Leukocytes,Ua LARGE (A) NEGATIVE   RBC / HPF 6-10 0 - 5 RBC/hpf   WBC, UA >50 0 - 5 WBC/hpf   Bacteria, UA MANY (A) NONE SEEN   Squamous Epithelial /  HPF 0-5 0 - 5 /HPF   WBC Clumps PRESENT    Mucus PRESENT   Resp panel by RT-PCR (RSV, Flu A&B, Covid) In/Out Cath Urine     Status: None   Collection Time: 10/20/23  8:44 PM   Specimen: In/Out Cath Urine; Nasal Swab  Result Value Ref Range   SARS Coronavirus 2 by RT PCR NEGATIVE NEGATIVE   Influenza A by PCR NEGATIVE NEGATIVE   Influenza B by PCR NEGATIVE NEGATIVE   Resp Syncytial Virus by PCR NEGATIVE NEGATIVE  APTT     Status: None   Collection Time: 10/20/23  8:44 PM  Result Value Ref Range   aPTT 26 24 - 36 seconds  Lactic acid, plasma     Status: Abnormal   Collection Time: 10/20/23 11:05 PM  Result Value Ref Range   Lactic Acid, Venous 4.1 (HH) 0.5 - 1.9 mmol/L    CBC: Recent Labs  Lab 10/20/23 2044  WBC 20.0*  NEUTROABS 14.8*  HGB 12.0  HCT 37.0  MCV 97.4  PLT 475*   Basic Metabolic Panel: Recent Labs  Lab 10/20/23 2044  NA 134*  K 3.8  CL 98  CO2 26  GLUCOSE 156*  BUN 10  CREATININE 0.81  CALCIUM 8.2*   GFR: Estimated Creatinine Clearance: 35.7 mL/min (by C-G formula based on SCr of 0.81 mg/dL). Liver Function Tests: Recent Labs  Lab 10/20/23 2044  AST 431*  ALT 123*  ALKPHOS 309*  BILITOT 1.1  PROT 6.8  ALBUMIN 2.5*   No results for input(s): "LIPASE", "AMYLASE" in the last 168 hours. No results for input(s): "AMMONIA" in the last 168 hours. Coagulation Profile: Recent Labs  Lab 10/20/23 2044  INR 1.1   Cardiac Enzymes: No results for input(s): "CKTOTAL", "CKMB", "CKMBINDEX", "TROPONINI" in the last 168 hours. BNP  (last 3 results) No results for input(s): "PROBNP" in the last 8760 hours. HbA1C: No results for input(s): "HGBA1C" in the last 72 hours. CBG: No results for input(s): "GLUCAP" in the last 168 hours. Lipid Profile: No results for input(s): "CHOL", "HDL", "LDLCALC", "TRIG", "CHOLHDL", "LDLDIRECT" in the last 72 hours. Thyroid Function Tests: No results for input(s): "TSH", "T4TOTAL", "FREET4", "T3FREE", "THYROIDAB" in the last 72 hours. Anemia Panel: No results for input(s): "VITAMINB12", "FOLATE", "FERRITIN", "TIBC", "IRON", "RETICCTPCT" in the last 72 hours. Urinalysis    Component Value Date/Time   COLORURINE AMBER (A) 10/20/2023 2044   APPEARANCEUR TURBID (A) 10/20/2023 2044   APPEARANCEUR Clear 01/11/2023 1029   LABSPEC 1.010 10/20/2023 2044   PHURINE 7.0 10/20/2023 2044   GLUCOSEU NEGATIVE 10/20/2023 2044   HGBUR SMALL (A) 10/20/2023 2044   BILIRUBINUR NEGATIVE 10/20/2023 2044   BILIRUBINUR Negative 01/11/2023 1029   KETONESUR NEGATIVE 10/20/2023 2044   PROTEINUR NEGATIVE 10/20/2023 2044   NITRITE POSITIVE (A) 10/20/2023 2044   LEUKOCYTESUR LARGE (A) 10/20/2023 2044      Unresulted Labs (From admission, onward)     Start     Ordered   10/21/23 0500  Protime-INR  Tomorrow morning,   R        10/21/23 0105   10/21/23 0500  Cortisol-am, blood  Tomorrow morning,   R        10/21/23 0105   10/21/23 0500  Procalcitonin  Tomorrow morning,   R       References:    Procalcitonin Lower Respiratory Tract Infection AND Sepsis Procalcitonin Algorithm   10/21/23 0105   10/21/23 0500  Comprehensive metabolic panel  Tomorrow morning,  R        10/21/23 0105   10/21/23 0500  CBC  Tomorrow morning,   R        10/21/23 0105   10/21/23 0105  Hemoglobin A1c  Once,   R        10/21/23 0105   10/21/23 0105  Lactic acid, plasma  (Lactic Acid)  STAT Now then every 3 hours,   STAT      10/21/23 0105   10/21/23 0105  Magnesium  Add-on,   AD        10/21/23 0105   10/21/23 0104  TSH   Once,   R        10/21/23 0105   10/21/23 0104  Acetaminophen level  Once,   R        10/21/23 0105   10/21/23 0104  Hepatitis panel, acute  Once,   R        10/21/23 0105   10/20/23 2045  Culture, blood (Routine x 2)  BLOOD CULTURE X 2,   STAT      10/20/23 2045   10/20/23 2044  Urine Culture  Once,   R        10/20/23 2044            Medications  lactated ringers infusion ( Intravenous New Bag/Given 10/20/23 2118)  cefTRIAXone (ROCEPHIN) 2 g in sodium chloride 0.9 % 100 mL IVPB (0 g Intravenous Stopped 10/20/23 2238)  azithromycin (ZITHROMAX) 500 mg in sodium chloride 0.9 % 250 mL IVPB (0 mg Intravenous Stopped 10/20/23 2338)  lactated ringers bolus 1,000 mL (1,000 mLs Intravenous New Bag/Given 10/21/23 0010)  aspirin EC tablet 81 mg (has no administration in time range)  clopidogrel (PLAVIX) tablet 75 mg (has no administration in time range)  donepezil (ARICEPT) tablet 5 mg (has no administration in time range)  gabapentin (NEURONTIN) capsule 100 mg (has no administration in time range)  levothyroxine (SYNTHROID) tablet 50 mcg (has no administration in time range)  mirtazapine (REMERON) tablet 7.5 mg (has no administration in time range)  pantoprazole (PROTONIX) EC tablet 40 mg (has no administration in time range)  QUEtiapine (SEROQUEL) tablet 25 mg (has no administration in time range)  lactated ringers infusion (has no administration in time range)  acetaminophen (TYLENOL) tablet 650 mg (has no administration in time range)    Or  acetaminophen (TYLENOL) suppository 650 mg (has no administration in time range)  heparin injection 5,000 Units (has no administration in time range)  acetaminophen (TYLENOL) tablet 1,000 mg (1,000 mg Oral Given 10/20/23 2234)  iohexol (OMNIPAQUE) 300 MG/ML solution 100 mL (100 mLs Intravenous Contrast Given 10/20/23 2209)  lactated ringers bolus 1,000 mL (1,000 mLs Intravenous New Bag/Given 10/21/23 0024)    Radiological Exams on  Admission: CT ABDOMEN PELVIS W CONTRAST  Result Date: 10/20/2023 CLINICAL DATA:  89 year old, provided history of sepsis. Fever with vomiting. EXAM: CT ABDOMEN AND PELVIS WITH CONTRAST TECHNIQUE: Multidetector CT imaging of the abdomen and pelvis was performed using the standard protocol following bolus administration of intravenous contrast. RADIATION DOSE REDUCTION: This exam was performed according to the departmental dose-optimization program which includes automated exposure control, adjustment of the mA and/or kV according to patient size and/or use of iterative reconstruction technique. CONTRAST:  OMNIPAQUE IOHEXOL 300 MG/ML  SOLN COMPARISON:  None Available. FINDINGS: Lower chest: Scattered atelectasis and chronic lung disease with subpleural reticulation. Coronary artery calcifications. Hepatobiliary: Patient is post cholecystectomy. There is intra and extrahepatic biliary ductal dilatation.  The common bile duct measures 15 mm at the porta hepatis and tapers distally. No visible choledocholithiasis. No suspicious hepatic lesion. Pancreas: Parenchymal atrophy. No ductal dilatation or inflammation. Spleen: Normal in size without focal abnormality. Adrenals/Urinary Tract: No adrenal nodule. Prominence of the renal collecting systems but no hydronephrosis. Symmetric renal enhancement and excretion on delayed phase imaging. No renal calculi or suspicious renal abnormality. Physiologically distended urinary bladder, no bladder wall thickening. Stomach/Bowel: Motion artifact limits assessment. Small hiatal hernia with fluid in the distal esophagus. Fluid within the stomach without gastric inflammation or wall thickening. No small bowel obstruction or inflammation. The appendix is not definitively seen. Moderate volume of stool throughout the colon. There is colonic redundancy. Colonic diverticulosis without focal diverticulitis. There is moderate stool distention of the rectum, rectal distention of 7.2  cm. Mild rectal wall thickening and perirectal edema. Vascular/Lymphatic: Aortic atherosclerosis without aneurysm. No acute vascular findings. No abdominopelvic adenopathy. Reproductive: Status post hysterectomy. No adnexal masses. Other: No free air or ascites. Musculoskeletal: Scoliosis and degenerative change in the lumbar spine. There are chronic calcifications in the left gluteus medius muscle posterior to the left acetabulum. IMPRESSION: 1. Moderate stool distention of the rectum with mild rectal wall thickening and perirectal edema, suspicious for stercoral colitis. 2. Moderate volume of stool throughout the colon, constipation suspected. Colonic diverticulosis without diverticulitis. 3. Intra and extrahepatic biliary ductal dilatation, likely due to post cholecystectomy state. Recommend correlation with liver function tests. If LFTs are normal, no further workup is recommended. If LFTs are elevated, MRCP is the workup of choice, however not recommended in this patient who cannot hold still for CT. 4. Small hiatal hernia with fluid in the distal esophagus, can be seen with gastroesophageal reflux. Aortic Atherosclerosis (ICD10-I70.0). Electronically Signed   By: Narda Rutherford M.D.   On: 10/20/2023 22:35   DG Chest 1 View  Result Date: 10/20/2023 CLINICAL DATA:  Fever, vomiting EXAM: CHEST  1 VIEW COMPARISON:  12/21/2018 FINDINGS: Single frontal view of the chest demonstrates an unremarkable cardiac silhouette. Lung volumes are diminished, without airspace disease, effusion, or pneumothorax. No acute bony abnormalities. IMPRESSION: 1. Low lung volumes.  No acute process. Electronically Signed   By: Sharlet Salina M.D.   On: 10/20/2023 21:16     Data Reviewed: Relevant notes from primary care and specialist visits, past discharge summaries as available in EHR, including Care Everywhere. Prior diagnostic testing as pertinent to current admission diagnoses Updated medications and problem lists for  reconciliation ED course, including vitals, labs, imaging, treatment and response to treatment Triage notes, nursing and pharmacy notes and ED provider's notes Notable results as noted in HPI  Assessment & Plan Fever PRN tylenol and cooling blanket as needed.   Hypothyroidism Will continue patient's home medication of leave with oxygen at 50 mcg. GERD (gastroesophageal reflux disease) IV PPI therapy for GERD as well as GI prophylaxis. UTI (urinary tract infection) Urinalysis    Component Value Date/Time   COLORURINE AMBER (A) 10/20/2023 2044   APPEARANCEUR TURBID (A) 10/20/2023 2044   APPEARANCEUR Clear 01/11/2023 1029   LABSPEC 1.010 10/20/2023 2044   PHURINE 7.0 10/20/2023 2044   GLUCOSEU NEGATIVE 10/20/2023 2044   HGBUR SMALL (A) 10/20/2023 2044   BILIRUBINUR NEGATIVE 10/20/2023 2044   BILIRUBINUR Negative 01/11/2023 1029   KETONESUR NEGATIVE 10/20/2023 2044   PROTEINUR NEGATIVE 10/20/2023 2044   NITRITE POSITIVE (A) 10/20/2023 2044   LEUKOCYTESUR LARGE (A) 10/20/2023 2044  Continue with rocephin and follow culture/ sensitivity.    Chronic  kidney disease, stage 3 (HCC) Lab Results  Component Value Date   CREATININE 0.81 10/20/2023   CREATININE 1.00 06/09/2023   CREATININE 1.00 06/06/2023  Stable, will monitor and follow for any worsening.  HTN (hypertension) Vitals:   10/20/23 2045 10/20/23 2240 10/20/23 2335 10/20/23 2340  BP: (!) 119/42 117/63 (!) 94/52 (!) 78/50   10/21/23 0000 10/21/23 0040 10/21/23 0100  BP: (!) 81/50 (!) 78/50 (!) 78/48  No home blood pressure medications.  Sepsis with multiple organ dysfunction (MOD) (HCC) Sepsis 2/2 Uti and associated with lactic acidosis and elevated LFT. We will continue iv abx- rocephin.  We will also cont with azithromycin until pt is stable . Pt has started to receive IVF delayed due to lack of iv access issue per report.  Vitals:   10/20/23 2045 10/20/23 2240 10/20/23 2335 10/20/23 2340  BP: (!) 119/42 117/63  (!) 94/52 (!) 78/50   10/21/23 0000 10/21/23 0040 10/21/23 0100  BP: (!) 81/50 (!) 78/50 (!) 78/48  No intake or output data in the 24 hours ending 10/21/23 0110  azithromycin Stopped (10/20/23 2338)   cefTRIAXone (ROCEPHIN)  IV Stopped (10/20/23 2238)   lactated ringers 150 mL/hr at 10/20/23 2118   lactated ringers      Elevated LFTs    Latest Ref Rng & Units 10/20/2023    8:44 PM 06/06/2023    4:47 AM 05/17/2023    3:24 PM  Hepatic Function  Total Protein 6.5 - 8.1 g/dL 6.8  6.4  7.1   Albumin 3.5 - 5.0 g/dL 2.5  2.9  3.9   AST 15 - 41 U/L 431  43  16   ALT 0 - 44 U/L 123  38  18   Alk Phosphatase 38 - 126 U/L 309  51  73   Total Bilirubin <1.2 mg/dL 1.1  0.5  0.3   Suspect 2/2 to hypoperfusion and shock liver , imaging also however states pt has  IMPRESSION: 1. Moderate stool distention of the rectum with mild rectal wall thickening and perirectal edema, suspicious for stercoral colitis. 2. Moderate volume of stool throughout the colon, constipation suspected. Colonic diverticulosis without diverticulitis. 3. Intra and extrahepatic biliary ductal dilatation, likely due to post cholecystectomy state. Recommend correlation with liver function tests. If LFTs are normal, no further workup is recommended. If LFTs are elevated, MRCP is the workup of choice, however not recommended in this patient who cannot hold still for CT. 4. Small hiatal hernia with fluid in the distal esophagus, can be seen with gastroesophageal reflux.   Aortic Atherosclerosis (ICD10-I70.0).  Hemiparesis affecting left side as late effect of cerebrovascular accident (CVA) (HCC) Pt is bedbound from her h/o cva and is not able to move of pull herself up and is weak.  Aspiration precaution.  Patient continued on her aspirin and Plavix.    Prognosis: Guarded   DVT prophylaxis:  Heparin every 12  Consults:  None   Advance Care Planning:    Code Status: Full Code   Family Communication:  Daughter  and son at bedside.   Disposition Plan:  SNF  Severity of Illness: The appropriate patient status for this patient is INPATIENT. Inpatient status is judged to be reasonable and necessary in order to provide the required intensity of service to ensure the patient's safety. The patient's presenting symptoms, physical exam findings, and initial radiographic and laboratory data in the context of their chronic comorbidities is felt to place them at high risk for further clinical deterioration. Furthermore,  it is not anticipated that the patient will be medically stable for discharge from the hospital within 2 midnights of admission.   * I certify that at the point of admission it is my clinical judgment that the patient will require inpatient hospital care spanning beyond 2 midnights from the point of admission due to high intensity of service, high risk for further deterioration and high frequency of surveillance required.*  Author: Gertha Calkin, MD 10/21/2023 1:08 AM  For on call review [email or web address].

## 2023-10-21 NOTE — Progress Notes (Signed)
   PCCM transfer request    Sending physician: Aleskerov  Sending facility: [facility]  Reason for transfer: need for ERCP  Brief case summary: 89 y/o F admitted with septic shock due to  E coli bacteremia, UTI, colangitis. [facility] recommending ERCP, but has to transfer to [facility] for this. She is DNR, DNI. She is on peripheral norepi, overall improving. LA cleared.   Recommendations made prior to transfer: ok to transfer with peripheral pressors on low dose  Transfer accepted: yes    Steffanie Dunn 10/21/23 6:46 PM [facility]  For contact information, see Amion. If no response to pager, please call PCCM consult pager. After hours, 7PM- 7AM, please call Elink.

## 2023-10-21 NOTE — Progress Notes (Signed)
PROGRESS NOTE    Sarah Freeman  WUJ:[id number] DOB: [birth year] DOA: 10/20/2023 PCP: Dorcas Carrow, DO     Brief Narrative:  89 year old with history of CVA left-sided hemiparesis, HLD, cognitive impairment/dementia sent from the facility for fever and vomiting.  Patient is a very poor historian.  Upon admission noted to be in septic shock secondary to UTI resuscitated with IV fluids, started on IV antibiotics.  Did have transaminitis attributed to likely hypotension.  CT abdomen showed intrahepatic extrabiliary ductal dilation.   Assessment & Plan:  Principal Problem:   Fever Active Problems:   Hypothyroidism   GERD (gastroesophageal reflux disease)   UTI (urinary tract infection)   Chronic kidney disease, stage 3 (HCC)   HTN (hypertension)   Septic shock (HCC)   Elevated LFTs   Hemiparesis affecting left side as late effect of cerebrovascular accident (CVA) (HCC)   Severe septic shock secondary to urinary tract infection - Sepsis and shock physiology slowly improving.  Follow culture data, tailor antibiotics accordingly.  Fluid as needed.  Wean off Levophed, started midodrine  Transaminitis History of cholecystectomy - Suspect from shock liver.  LFTs slowly improving.  CT abdomen pelvis does show ductal dilation likely from previous cholecystectomy.  GI recommending MRCP  Constipation with some sterile colitis - Stool softeners.  Hypothyroidism - Synthroid   Essential hypertension -Currently on hold due to hypotension  History of CVA with left-sided hemiparesis -Chronic.  On aspirin and Plavix  DVT prophylaxis: heparin injection 5,000 Units Start: 10/21/23 0115   Code Status: DNR Family Communication: Son at bedside Status is: Inpatient Remains inpatient appropriate because: Continue ICU stay until weaned off Levophed    Subjective: Resting comfortably this morning.  Soft blood pressure requiring Levophed.  Now started on  midodrine.   Examination:  General exam: Appears calm and comfortable, chronically ill Respiratory system: Decreased breath sounds at the bases Cardiovascular system: S1 & S2 heard, RRR. No JVD, murmurs, rubs, gallops or clicks. No pedal edema. Gastrointestinal system: Abdomen is nondistended, soft and nontender. No organomegaly or masses felt. Normal bowel sounds heard. Central nervous system: Alert and oriented.  Chronic left-sided deficit Extremities: Symmetric 4 x 5 power. Skin: No rashes, lesions or ulcers                     Diet Orders (From admission, onward)     Start     Ordered   10/21/23 0104  Diet Heart Room service appropriate? Yes; Fluid consistency: Thin  Diet effective now       Question Answer Comment  Room service appropriate? Yes   Fluid consistency: Thin      10/21/23 0105            Objective: Vitals:   10/21/23 1230 10/21/23 1245 10/21/23 1300 10/21/23 1315  BP: (!) 105/51 (!) 111/56 (!) 107/50 (!) 105/51  Pulse: 61 69 (!) 58 73  Resp: 12 (!) 24 (!) 9 (!) 24  Temp:      TempSrc:      SpO2: 98% 99% 98% 99%  Weight:        Intake/Output Summary (Last 24 hours) at 10/21/2023 1348 Last data filed at 10/21/2023 1325 Gross per 24 hour  Intake 5826.25 ml  Output 2650 ml  Net 3176.25 ml   Filed Weights   10/20/23 2032 10/21/23 0235  Weight: 68 kg 67 kg    Scheduled Meds:  aspirin EC  81 mg Oral Daily   budesonide (PULMICORT) nebulizer solution  0.5 mg Nebulization BID   Chlorhexidine Gluconate Cloth  6 each Topical Daily   clopidogrel  75 mg Oral Daily   donepezil  5 mg Oral Daily   heparin injection (subcutaneous)  5,000 Units Subcutaneous Q12H   levothyroxine  50 mcg Oral Q0600   midodrine  5 mg Oral TID WC   mirtazapine  7.5 mg Oral QHS   mouth rinse  15 mL Mouth Rinse 4 times per day   pantoprazole  40 mg Oral Daily   QUEtiapine  25 mg Oral QPM   Continuous Infusions:  sodium chloride Stopped (10/21/23 0351)    lactated ringers 150 mL/hr at 10/21/23 1325   norepinephrine (LEVOPHED) Adult infusion 2 mcg/min (10/21/23 1325)   piperacillin-tazobactam (ZOSYN)  IV 12.5 mL/hr at 10/21/23 1325    Nutritional status     Body mass index is 33.12 kg/m.  Data Reviewed:   CBC: Recent Labs  Lab 10/20/23 2044 10/21/23 0504  WBC 20.0* 17.7*  NEUTROABS 14.8*  --   HGB 12.0 10.0*  HCT 37.0 29.8*  MCV 97.4 94.0  PLT 475* 383   Basic Metabolic Panel: Recent Labs  Lab 10/20/23 2044 10/21/23 0504  NA 134* 136  K 3.8 3.5  CL 98 103  CO2 26 24  GLUCOSE 156* 169*  BUN 10 10  CREATININE 0.81 0.76  CALCIUM 8.2* 8.1*  MG  --  1.9  PHOS  --  3.5   GFR: Estimated Creatinine Clearance: 35.9 mL/min (by C-G formula based on SCr of 0.76 mg/dL). Liver Function Tests: Recent Labs  Lab 10/20/23 2044 10/21/23 0504  AST 431* 357*  ALT 123* 139*  ALKPHOS 309* 284*  BILITOT 1.1 1.3*  PROT 6.8 5.7*  ALBUMIN 2.5* 2.4*   No results for input(s): "LIPASE", "AMYLASE" in the last 168 hours. No results for input(s): "AMMONIA" in the last 168 hours. Coagulation Profile: Recent Labs  Lab 10/20/23 2044 10/21/23 0504  INR 1.1 1.3*   Cardiac Enzymes: No results for input(s): "CKTOTAL", "CKMB", "CKMBINDEX", "TROPONINI" in the last 168 hours. BNP (last 3 results) No results for input(s): "PROBNP" in the last 8760 hours. HbA1C: Recent Labs    10/21/23 0238  HGBA1C 5.8*   CBG: Recent Labs  Lab 10/21/23 0155  GLUCAP 148*   Lipid Profile: No results for input(s): "CHOL", "HDL", "LDLCALC", "TRIG", "CHOLHDL", "LDLDIRECT" in the last 72 hours. Thyroid Function Tests: Recent Labs    10/21/23 0504  TSH 2.666   Anemia Panel: No results for input(s): "VITAMINB12", "FOLATE", "FERRITIN", "TIBC", "IRON", "RETICCTPCT" in the last 72 hours. Sepsis Labs: Recent Labs  Lab 10/20/23 2044 10/20/23 2305 10/21/23 0238 10/21/23 0504  PROCALCITON  --   --   --  5.69  LATICACIDVEN 2.5* 4.1* 2.7* 1.7     Recent Results (from the past 240 hour(s))  Culture, blood (Routine x 2)     Status: None (Preliminary result)   Collection Time: 10/20/23  8:44 PM   Specimen: BLOOD  Result Value Ref Range Status   Specimen Description   Final    BLOOD BLOOD RIGHT ARM Performed at [facility], [address] [place], [place] [address]    Special Requests   Final    BOTTLES DRAWN AEROBIC AND ANAEROBIC Blood Culture adequate volume Performed at [facility], [address], [place], [place] [address]    Culture  Setup Time   Final    ANAEROBIC BOTTLE ONLY Performed at [facility], [address] [place],  [place] [address]    Culture   Final    NO GROWTH < 24 HOURS Performed at [facility], [address] [place], [place] [address]    Report Status PENDING  Incomplete  Culture, blood (Routine x 2)     Status: None (Preliminary result)   Collection Time: 10/20/23  8:44 PM   Specimen: BLOOD  Result Value Ref Range Status   Specimen Description BLOOD BLOOD LEFT ARM  Final   Special Requests   Final    BOTTLES DRAWN AEROBIC AND ANAEROBIC Blood Culture results may not be optimal due to an excessive volume of blood received in culture bottles   Culture  Setup Time   Final    Organism ID to follow GRAM NEGATIVE RODS IN BOTH AEROBIC AND ANAEROBIC BOTTLES CRITICAL RESULT CALLED TO, READ BACK BY AND VERIFIED WITH: WALID NAZARI AT 1120 10/21/23.PMF Performed at [facility], [address] [place], [place] [address]    Culture GRAM NEGATIVE RODS  Final   Report Status PENDING  Incomplete  Resp panel by RT-PCR (RSV, Flu A&B, Covid) In/Out Cath Urine     Status: None   Collection Time: 10/20/23  8:44 PM   Specimen: In/Out Cath Urine; Nasal Swab  Result Value Ref Range Status   SARS Coronavirus 2 by RT PCR NEGATIVE NEGATIVE Final    Comment: (NOTE) SARS-CoV-2 target nucleic acids are NOT DETECTED.  The SARS-CoV-2 RNA is generally detectable in  upper respiratory specimens during the acute phase of infection. The lowest concentration of SARS-CoV-2 viral copies this assay can detect is 138 copies/mL. A negative result does not preclude SARS-Cov-2 infection and should not be used as the sole basis for treatment or other patient management decisions. A negative result may occur with  improper specimen collection/handling, submission of specimen other than nasopharyngeal swab, presence of viral mutation(s) within the areas targeted by this assay, and inadequate number of viral copies(<138 copies/mL). A negative result must be combined with clinical observations, patient history, and epidemiological information. The expected result is Negative.  Fact Sheet for Patients:  BloggerCourse.com  Fact Sheet for Healthcare Providers:  SeriousBroker.it  This test is no t yet approved or cleared by the [place] FDA and  has been authorized for detection and/or diagnosis of SARS-CoV-2 by FDA under an Emergency Use Authorization (EUA). This EUA will remain  in effect (meaning this test can be used) for the duration of the COVID-19 declaration under Section 564(b)(1) of the Act, 21 U.S.C.section 360bbb-3(b)(1), unless the authorization is terminated  or revoked sooner.       Influenza A by PCR NEGATIVE NEGATIVE Final   Influenza B by PCR NEGATIVE NEGATIVE Final    Comment: (NOTE) The Xpert Xpress SARS-CoV-2/FLU/RSV plus assay is intended as an aid in the diagnosis of influenza from Nasopharyngeal swab specimens and should not be used as a sole basis for treatment. Nasal washings and aspirates are unacceptable for Xpert Xpress SARS-CoV-2/FLU/RSV testing.  Fact Sheet for Patients: BloggerCourse.com  Fact Sheet for Healthcare Providers: SeriousBroker.it  This test is not yet approved or cleared by the [place] FDA and has been  authorized for detection and/or diagnosis of SARS-CoV-2 by FDA under an Emergency Use Authorization (EUA). This EUA will remain in effect (meaning this test can be used) for the duration of the COVID-19 declaration under Section 564(b)(1) of the Act, 21 U.S.C. section 360bbb-3(b)(1), unless the authorization is terminated or revoked.     Resp Syncytial Virus by  PCR NEGATIVE NEGATIVE Final    Comment: (NOTE) Fact Sheet for Patients: BloggerCourse.com  Fact Sheet for Healthcare Providers: SeriousBroker.it  This test is not yet approved or cleared by the [place] FDA and has been authorized for detection and/or diagnosis of SARS-CoV-2 by FDA under an Emergency Use Authorization (EUA). This EUA will remain in effect (meaning this test can be used) for the duration of the COVID-19 declaration under Section 564(b)(1) of the Act, 21 U.S.C. section 360bbb-3(b)(1), unless the authorization is terminated or revoked.  Performed at [facility], [address] [place], [place] [address]   Urine Culture     Status: None (Preliminary result)   Collection Time: 10/20/23  8:44 PM   Specimen: Urine, Catheterized  Result Value Ref Range Status   Specimen Description   Final    URINE, CATHETERIZED Performed at [facility], [address] [place], [place] [address]    Special Requests   Final    NONE Reflexed from [id number] Performed at [facility], [address] [place], [place] [address]    Culture PENDING  Incomplete   Report Status PENDING  Incomplete  Blood Culture ID Panel (Reflexed)     Status: Abnormal   Collection Time: 10/20/23  8:44 PM  Result Value Ref Range Status   Enterococcus faecalis NOT DETECTED NOT DETECTED Final   Enterococcus Faecium NOT DETECTED NOT DETECTED Final   Listeria monocytogenes NOT DETECTED NOT DETECTED Final   Staphylococcus species NOT DETECTED NOT DETECTED Final    Staphylococcus aureus (BCID) NOT DETECTED NOT DETECTED Final   Staphylococcus epidermidis NOT DETECTED NOT DETECTED Final   Staphylococcus lugdunensis NOT DETECTED NOT DETECTED Final   Streptococcus species NOT DETECTED NOT DETECTED Final   Streptococcus agalactiae NOT DETECTED NOT DETECTED Final   Streptococcus pneumoniae NOT DETECTED NOT DETECTED Final   Streptococcus pyogenes NOT DETECTED NOT DETECTED Final   A.calcoaceticus-baumannii NOT DETECTED NOT DETECTED Final   Bacteroides fragilis NOT DETECTED NOT DETECTED Final   Enterobacterales DETECTED (A) NOT DETECTED Final    Comment: Enterobacterales represent a large order of gram negative bacteria, not a single organism. CRITICAL RESULT CALLED TO, READ BACK BY AND VERIFIED WITH: WALID NAZARI AT 1120 10/21/23.PMF    Enterobacter cloacae complex NOT DETECTED NOT DETECTED Final   Escherichia coli DETECTED (A) NOT DETECTED Final    Comment: CRITICAL RESULT CALLED TO, READ BACK BY AND VERIFIED WITH: WALID NAZARI AT 1120 10/21/23.PMF    Klebsiella aerogenes NOT DETECTED NOT DETECTED Final   Klebsiella oxytoca NOT DETECTED NOT DETECTED Final   Klebsiella pneumoniae NOT DETECTED NOT DETECTED Final   Proteus species NOT DETECTED NOT DETECTED Final   Salmonella species NOT DETECTED NOT DETECTED Final   Serratia marcescens NOT DETECTED NOT DETECTED Final   Haemophilus influenzae NOT DETECTED NOT DETECTED Final   Neisseria meningitidis NOT DETECTED NOT DETECTED Final   Pseudomonas aeruginosa NOT DETECTED NOT DETECTED Final   Stenotrophomonas maltophilia NOT DETECTED NOT DETECTED Final   Candida albicans NOT DETECTED NOT DETECTED Final   Candida auris NOT DETECTED NOT DETECTED Final   Candida glabrata NOT DETECTED NOT DETECTED Final   Candida krusei NOT DETECTED NOT DETECTED Final   Candida parapsilosis NOT DETECTED NOT DETECTED Final   Candida tropicalis NOT DETECTED NOT DETECTED Final   Cryptococcus neoformans/gattii NOT DETECTED NOT  DETECTED Final   CTX-M ESBL NOT DETECTED NOT DETECTED Final   Carbapenem resistance IMP NOT DETECTED NOT DETECTED Final   Carbapenem resistance  KPC NOT DETECTED NOT DETECTED Final   Carbapenem resistance NDM NOT DETECTED NOT DETECTED Final   Carbapenem resist OXA 48 LIKE NOT DETECTED NOT DETECTED Final   Carbapenem resistance VIM NOT DETECTED NOT DETECTED Final    Comment: Performed at [facility], [address] [place], [place] [address]  MRSA Next Gen by PCR, Nasal     Status: None   Collection Time: 10/21/23  2:13 AM   Specimen: Nasal Mucosa; Nasal Swab  Result Value Ref Range Status   MRSA by PCR Next Gen NOT DETECTED NOT DETECTED Final    Comment: (NOTE) The GeneXpert MRSA Assay (FDA approved for NASAL specimens only), is one component of a comprehensive MRSA colonization surveillance program. It is not intended to diagnose MRSA infection nor to guide or monitor treatment for MRSA infections. Test performance is not FDA approved in patients less than 68 years old. Performed at [facility], [address], [place], [place] [address]          Radiology Studies: CT ABDOMEN PELVIS W CONTRAST  Result Date: 10/20/2023 CLINICAL DATA:  89 year old, provided history of sepsis. Fever with vomiting. EXAM: CT ABDOMEN AND PELVIS WITH CONTRAST TECHNIQUE: Multidetector CT imaging of the abdomen and pelvis was performed using the standard protocol following bolus administration of intravenous contrast. RADIATION DOSE REDUCTION: This exam was performed according to the departmental dose-optimization program which includes automated exposure control, adjustment of the mA and/or kV according to patient size and/or use of iterative reconstruction technique. CONTRAST:  OMNIPAQUE IOHEXOL 300 MG/ML  SOLN COMPARISON:  None Available. FINDINGS: Lower chest: Scattered atelectasis and chronic lung disease with subpleural reticulation. Coronary artery calcifications.  Hepatobiliary: Patient is post cholecystectomy. There is intra and extrahepatic biliary ductal dilatation. The common bile duct measures 15 mm at the porta hepatis and tapers distally. No visible choledocholithiasis. No suspicious hepatic lesion. Pancreas: Parenchymal atrophy. No ductal dilatation or inflammation. Spleen: Normal in size without focal abnormality. Adrenals/Urinary Tract: No adrenal nodule. Prominence of the renal collecting systems but no hydronephrosis. Symmetric renal enhancement and excretion on delayed phase imaging. No renal calculi or suspicious renal abnormality. Physiologically distended urinary bladder, no bladder wall thickening. Stomach/Bowel: Motion artifact limits assessment. Small hiatal hernia with fluid in the distal esophagus. Fluid within the stomach without gastric inflammation or wall thickening. No small bowel obstruction or inflammation. The appendix is not definitively seen. Moderate volume of stool throughout the colon. There is colonic redundancy. Colonic diverticulosis without focal diverticulitis. There is moderate stool distention of the rectum, rectal distention of 7.2 cm. Mild rectal wall thickening and perirectal edema. Vascular/Lymphatic: Aortic atherosclerosis without aneurysm. No acute vascular findings. No abdominopelvic adenopathy. Reproductive: Status post hysterectomy. No adnexal masses. Other: No free air or ascites. Musculoskeletal: Scoliosis and degenerative change in the lumbar spine. There are chronic calcifications in the left gluteus medius muscle posterior to the left acetabulum. IMPRESSION: 1. Moderate stool distention of the rectum with mild rectal wall thickening and perirectal edema, suspicious for stercoral colitis. 2. Moderate volume of stool throughout the colon, constipation suspected. Colonic diverticulosis without diverticulitis. 3. Intra and extrahepatic biliary ductal dilatation, likely due to post cholecystectomy state. Recommend correlation  with liver function tests. If LFTs are normal, no further workup is recommended. If LFTs are elevated, MRCP is the workup of choice, however not recommended in this patient who cannot hold still for CT. 4. Small hiatal hernia with fluid in the distal esophagus, can be seen with gastroesophageal reflux. Aortic Atherosclerosis (  ICD10-I70.0). Electronically Signed   By: Narda Rutherford M.D.   On: 10/20/2023 22:35   DG Chest 1 View  Result Date: 10/20/2023 CLINICAL DATA:  Fever, vomiting EXAM: CHEST  1 VIEW COMPARISON:  12/21/2018 FINDINGS: Single frontal view of the chest demonstrates an unremarkable cardiac silhouette. Lung volumes are diminished, without airspace disease, effusion, or pneumothorax. No acute bony abnormalities. IMPRESSION: 1. Low lung volumes.  No acute process. Electronically Signed   By: Sharlet Salina M.D.   On: 10/20/2023 21:16           LOS: 0 days   Time spent= 35 mins    Miguel Rota, MD [facility]  If 7PM-7AM, please contact night-coverage  10/21/2023, 1:48 PM

## 2023-10-21 NOTE — Plan of Care (Signed)
  Problem: Fluid Volume: Goal: Hemodynamic stability will improve Outcome: Progressing   Problem: Respiratory: Goal: Ability to maintain adequate ventilation will improve Outcome: Progressing   Problem: Activity: Goal: Risk for activity intolerance will decrease Outcome: Progressing   Problem: Pain Management: Goal: General experience of comfort will improve Outcome: Progressing

## 2023-10-21 NOTE — Assessment & Plan Note (Addendum)
Pt is bedbound from her h/o cva and is not able to move of pull herself up and is weak.  Aspiration precaution.  Patient continued on her aspirin and Plavix.

## 2023-10-21 NOTE — Consult Note (Signed)
NAME:  Sarah Freeman, MRN:  [id number], DOB:  18-Apr-1931, LOS: 0 ADMISSION DATE:  10/20/2023, CONSULTATION DATE:  10/21/23 REFERRING MD:  Dr. Renaldo Reel, CHIEF COMPLAINT: Fever & Emesis    History of Present Illness:  89 yo F presenting to [facility] ED from [facility] via EMS on 11/21 for evaluation of fever and vomiting.  History obtained per chart review & daughter's bedside report. Patient was in her normal state of health until 10/20/23 when SNF staff reported fever and projectile vomiting. Daughter denies any other recent complaints or noted symptoms. Patient is lethargic and unable to fully participate in interview at this time. Patient does have a history of recurrent UTI's as well as constipation.  ED course: Patient Alert and responsive, meeting Sepsis criteria with a fever and tachycardia. Sepsis protocol ordered with IVF and empiric antibiotics. Labs significant for lactic acidosis & leukocytosis, Transaminitis, mild hyponatremia and hypoalbuminemia with UA confirming UTI. Imaging revealed intra/extra hepatic biliary ductal dilatation, constipation and possible colitis. TRH consulted for admission. Medications given: Tylenol, azithromycin & Rocephin, 2 L LR bolus, LR infusion, IV contrast Initial Vitals: 102.7, 19, 119, 119/42 & 99% on RA Significant labs: (Labs/ Imaging personally reviewed) I, Cheryll Cockayne Rust-Chester, AGACNP-BC, personally viewed and interpreted this ECG. EKG Interpretation: Date: 10/20/23, EKG Time: 20:50, Rate: 127, Rhythm: ST, QRS Axis: LAD, Intervals: normal, ST/T Wave abnormalities: none,  Narrative Interpretation: ST Chemistry: Na+:134, K+: 3.8, BUN/Cr.: 10/0.81, Serum CO2/ AG: 26/10, Alk Phos: 309, Albumin: 2.5, AST/ALT: 431/123 Hematology: WBC: 20, Hgb: 12,  Lactic/ PCT: 2.5 > 4.1/ pending, COVID-19 & Influenza A/B: negative UA: + leuks, +nitrites, +pyruia CXR 10/20/23: low lung volumes. No acute process CT abdomen/pelvis w contrast 10/20/23: Moderate  stool distention of the rectum with mild rectal wall thickening and perirectal edema, suspicious for stercoral colitis. Moderate volume of stool throughout the colon, constipation suspected. Colonic diverticulosis without diverticulitis. Intra and extrahepatic biliary ductal dilatation, likely due to post cholecystectomy state. Recommend correlation with liver function tests. If LFTs are normal, no further workup is recommended. If LFTs are elevated, MRCP is the workup of choice, however not recommended in this patient who cannot hold still for CT. Small hiatal hernia with fluid in the distal esophagus, can be seen with gastroesophageal reflux.  After IVF resuscitation, patient's BP initially stable- then again hypotensive. PCCM consulted for assistance in management and monitoring due to sepsis with shock s/t UTI with potential need for vasopressor support.  Pertinent  Medical History  Hypothyroidism HLD CVA with Left sided hemiparesis Mild cognitive impairment  Significant Hospital Events: Including procedures, antibiotic start and stop dates in addition to other pertinent events   10/21/23: admit to SDU with sepsis and shock s/t UTI, with potential need for vasopressor support.  Interim History / Subjective:  Patient lethargic, unable to participate in interview at this time. Daughter bedside updated on plan of care, all questions and concerns answered at this time.  Objective   Blood pressure 101/60, pulse 94, temperature 98.2 F (36.8 C), temperature source Axillary, resp. rate 15, weight 67 kg, SpO2 98%.        Intake/Output Summary (Last 24 hours) at 10/21/2023 0252 Last data filed at 10/21/2023 1191 Gross per 24 hour  Intake 3148.02 ml  Output --  Net 3148.02 ml   Filed Weights   10/20/23 2032 10/21/23 0235  Weight: 68 kg 67 kg    Examination: General: Adult female, critically ill, lying in bed, NAD HEENT: MM pink/moist, anicteric, atraumatic, neck  supple Neuro: A&O x  1, lethargic unable to follow commands, PERRL +3, paralyzed left side CV: s1s2 RRR, NSR on monitor, no r/m/g Pulm: Regular, non labored on RA , breath sounds clear-BUL & diminished-BLL GI: soft, rounded, non tender, bs x 4 Skin: no rashes/lesions noted Extremities: warm/dry, pulses + 2 R/P, no edema noted  Resolved Hospital Problem list     Assessment & Plan:  Sepsis with septic shock due to suspected UTI Initial interventions/workup included: 2 L of NS/LR & Ceftriaxone/ Azithromycin - Supplemental oxygen as needed, to maintain SpO2 > 90% - f/u cultures, trend lactic/ PCT - Daily CBC, monitor WBC/ fever curve - Agree with IV antibiotic change: zosyn & vancomycin  - IVF hydration as needed: continue IVF @ 150 mL/h - additional 500 mL + albumin - Consider vasopressors to maintain MAP< 65: norepinephrine - Strict I/O's: alert provider if UOP < 0.5 mL/kg/hr  Transaminitis  PMHx: Cholecystectomy - Trend hepatic function - Consider MRCP - avoid hepatotoxic agents > Simvastatin on hold, consider restarting as LFT's normalize - consult GI/general surgery as needed  Hypothyroidism - continue outpatient regimen  CVA - history of Mild Cognitive Impairment - continue outpatient regimen: ASA & plavix - continue Aricept - consider holding Remeron & Seroquel due to lethargy  - aspiration precautions  Best Practice (right click and "Reselect all SmartList Selections" daily)  Diet/type: Regular consistency (see orders) DVT prophylaxis: heparin injection 5,000 Units Start: 10/21/23 0115 GI prophylaxis: PPI Lines: N/A Foley:  Yes, and it is still needed Code Status:  DNR Last date of multidisciplinary goals of care discussion [per primary]  Labs   CBC: Recent Labs  Lab 10/20/23 2044  WBC 20.0*  NEUTROABS 14.8*  HGB 12.0  HCT 37.0  MCV 97.4  PLT 475*    Basic Metabolic Panel: Recent Labs  Lab 10/20/23 2044  NA 134*  K 3.8  CL 98  CO2 26  GLUCOSE 156*  BUN 10   CREATININE 0.81  CALCIUM 8.2*   GFR: Estimated Creatinine Clearance: 35.4 mL/min (by C-G formula based on SCr of 0.81 mg/dL). Recent Labs  Lab 10/20/23 2044 10/20/23 2305  WBC 20.0*  --   LATICACIDVEN 2.5* 4.1*    Liver Function Tests: Recent Labs  Lab 10/20/23 2044  AST 431*  ALT 123*  ALKPHOS 309*  BILITOT 1.1  PROT 6.8  ALBUMIN 2.5*   No results for input(s): "LIPASE", "AMYLASE" in the last 168 hours. No results for input(s): "AMMONIA" in the last 168 hours.  ABG    Component Value Date/Time   TCO2 25 06/06/2023 0450     Coagulation Profile: Recent Labs  Lab 10/20/23 2044  INR 1.1    Cardiac Enzymes: No results for input(s): "CKTOTAL", "CKMB", "CKMBINDEX", "TROPONINI" in the last 168 hours.  HbA1C: Hgb A1c MFr Bld  Date/Time Value Ref Range Status  04/06/2023 11:36 AM 6.0 (H) 4.8 - 5.6 % Final    Comment:    (NOTE) Pre diabetes:          5.7%-6.4%  Diabetes:              >6.4%  Glycemic control for   <7.0% adults with diabetes   01/18/2018 10:00 AM 6.1 (H) 4.8 - 5.6 % Final    Comment:    (NOTE) Pre diabetes:          5.7%-6.4% Diabetes:              >6.4% Glycemic control for   <7.0% adults  with diabetes     CBG: Recent Labs  Lab 10/21/23 0155  GLUCAP 148*    Review of Systems:   UTA- patient unable to participate in interview at this time Past Medical History:  She,  has a past medical history of Hyperlipidemia, Hypothyroid, Stroke (HCC), and Urinary incontinence.   Surgical History:   Past Surgical History:  Procedure Laterality Date   BREAST BIOPSY Left 2000?   neg. dr. R[place]ford [facility] office   CHOLECYSTECTOMY  1964   COLONOSCOPY     Dr Maryruth Bun   DILATION AND CURETTAGE OF UTERUS     EYE SURGERY     LOOP RECORDER INSERTION N/A 02/09/2018   Procedure: LOOP RECORDER INSERTION;  Surgeon: Duke Salvia, MD;  Location: [facility] INVASIVE CV LAB;  Service: Cardiovascular;  Laterality: N/A;   SKIN CANCER EXCISION     face   TEE  WITHOUT CARDIOVERSION N/A 02/09/2018   Procedure: TRANSESOPHAGEAL ECHOCARDIOGRAM (TEE);  Surgeon: Antonieta Iba, MD;  Location: [facility] ORS;  Service: Cardiovascular;  Laterality: N/A;   TOTAL ABDOMINAL HYSTERECTOMY W/ BILATERAL SALPINGOOPHORECTOMY     age 67     Social History:   reports that she has never smoked. She has never been exposed to tobacco smoke. She has never used smokeless tobacco. She reports that she does not drink alcohol and does not use drugs.   Family History:  Her family history includes CAD in her mother; Cancer in her father; Diabetes in her maternal grandmother; Failure to thrive in her mother; Heart disease in her daughter, maternal grandfather, maternal grandmother, and son; Hyperlipidemia in her daughter; Hypertension in her sister; Stroke in her father. There is no history of Breast cancer.   Allergies Allergies  Allergen Reactions   Statins Other (See Comments)    Leg cramps    Bacitracin Other (See Comments)    Doesn't help wound to heal   Neomycin-Polymyxin-Gramicidin Other (See Comments)    Pt states wounds do not heal with neosporin      Home Medications  Prior to Admission medications   Medication Sig Start Date End Date Taking? Authorizing Provider  alendronate (FOSAMAX) 70 MG/75ML solution Take 70 mg by mouth every 7 (seven) days. 08/22/23   [provider]  aspirin EC 81 MG tablet Take 81 mg by mouth daily. Swallow whole.    [provider]  cholecalciferol (VITAMIN D3) 25 MCG (1000 UNIT) tablet Take 1,000 Units by mouth daily.    [provider]  clopidogrel (PLAVIX) 75 MG tablet Take 75 mg by mouth daily. 10/11/23   [provider]  cyanocobalamin 1000 MCG tablet Take 1 tablet (1,000 mcg total) by mouth daily. 04/19/23   Amin, Ankit C, MD  cyclobenzaprine (FLEXERIL) 5 MG tablet Take 5 mg by mouth 3 (three) times daily as needed. 09/22/23   [provider]  donepezil (ARICEPT) 5 MG tablet Take 1  tablet (5 mg total) by mouth at bedtime. Patient taking differently: Take 5 mg by mouth daily. 01/04/23   Johnson, Megan P, DO  gabapentin (NEURONTIN) 100 MG capsule TAKE 2 CAPSULES BY MOUTH EVERY MORNING AND 1 CAPSULE AT 1 PM 07/05/23   Johnson, Megan P, DO  levothyroxine (SYNTHROID) 50 MCG tablet Take 1 tablet (50 mcg total) by mouth daily before breakfast. 01/10/23   Laural Benes, Megan P, DO  mirtazapine (REMERON) 7.5 MG tablet Take 7.5 mg by mouth at bedtime. 09/30/23   [provider]  nitrofurantoin, macrocrystal-monohydrate, (MACROBID) 100 MG capsule Take 1 capsule (  100 mg total) by mouth at bedtime. 11/08/22   Alfredo Martinez, MD  pantoprazole (PROTONIX) 40 MG tablet Take 1 tablet (40 mg total) by mouth daily. 05/17/23   Johnson, Megan P, DO  polyethylene glycol (MIRALAX / GLYCOLAX) 17 g packet Take 17 g by mouth daily.    [provider]  PROLIA 60 MG/ML SOSY injection Inject 60 mg into the skin every 6 (six) months. 05/22/20   [provider]  QUEtiapine (SEROQUEL) 25 MG tablet Take 0.5-1 tablets (12.5-25 mg total) by mouth every evening. Patient taking differently: Take 25 mg by mouth every evening. 05/17/23   Johnson, Megan P, DO  simvastatin (ZOCOR) 20 MG tablet Take 1 tablet (20 mg total) by mouth daily at 6 PM. Patient taking differently: Take 20 mg by mouth daily. 05/17/23   Dorcas Carrow, DO     Critical care time: 62 minutes       Betsey Holiday, AGACNP-BC Acute Care Nurse Practitioner [facility]   [phone] / [phone] Please see Amion for details.

## 2023-10-21 NOTE — Assessment & Plan Note (Addendum)
Will continue patient's home medication of leave with oxygen at 50 mcg.

## 2023-10-21 NOTE — Plan of Care (Signed)
  Problem: Fluid Volume: Goal: Hemodynamic stability will improve Outcome: Progressing   Problem: Respiratory: Goal: Ability to maintain adequate ventilation will improve Outcome: Progressing   Problem: Pain Management: Goal: General experience of comfort will improve Outcome: Progressing

## 2023-10-21 NOTE — Progress Notes (Signed)
PHARMACY - PHYSICIAN COMMUNICATION CRITICAL VALUE ALERT - BLOOD CULTURE IDENTIFICATION (BCID)  Sarah Freeman is an 89 y.o. female who presented to [facility] on 10/20/2023 with a chief complaint of fever and projectile vomiting  Assessment:  blood culture from 11/21 with GNR, BCID detects E. Coli (no resistance). Concern is for UTI and stercoral colitis.   Name of physician (or Provider) Contacted: Dr A. Amin  Current antibiotics: piperacillin/tazobactam  Changes to prescribed antibiotics recommended:  Patient is on recommended antibiotics - No changes needed  Results for orders placed or performed during the hospital encounter of 10/20/23  Blood Culture ID Panel (Reflexed) (Collected: 10/20/2023  8:44 PM)  Result Value Ref Range   Enterococcus faecalis NOT DETECTED NOT DETECTED   Enterococcus Faecium NOT DETECTED NOT DETECTED   Listeria monocytogenes NOT DETECTED NOT DETECTED   Staphylococcus species NOT DETECTED NOT DETECTED   Staphylococcus aureus (BCID) NOT DETECTED NOT DETECTED   Staphylococcus epidermidis NOT DETECTED NOT DETECTED   Staphylococcus lugdunensis NOT DETECTED NOT DETECTED   Streptococcus species NOT DETECTED NOT DETECTED   Streptococcus agalactiae NOT DETECTED NOT DETECTED   Streptococcus pneumoniae NOT DETECTED NOT DETECTED   Streptococcus pyogenes NOT DETECTED NOT DETECTED   A.calcoaceticus-baumannii NOT DETECTED NOT DETECTED   Bacteroides fragilis NOT DETECTED NOT DETECTED   Enterobacterales DETECTED (A) NOT DETECTED   Enterobacter cloacae complex NOT DETECTED NOT DETECTED   Escherichia coli DETECTED (A) NOT DETECTED   Klebsiella aerogenes NOT DETECTED NOT DETECTED   Klebsiella oxytoca NOT DETECTED NOT DETECTED   Klebsiella pneumoniae NOT DETECTED NOT DETECTED   Proteus species NOT DETECTED NOT DETECTED   Salmonella species NOT DETECTED NOT DETECTED   Serratia marcescens NOT DETECTED NOT DETECTED   Haemophilus influenzae NOT DETECTED NOT DETECTED    Neisseria meningitidis NOT DETECTED NOT DETECTED   Pseudomonas aeruginosa NOT DETECTED NOT DETECTED   Stenotrophomonas maltophilia NOT DETECTED NOT DETECTED   Candida albicans NOT DETECTED NOT DETECTED   Candida auris NOT DETECTED NOT DETECTED   Candida glabrata NOT DETECTED NOT DETECTED   Candida krusei NOT DETECTED NOT DETECTED   Candida parapsilosis NOT DETECTED NOT DETECTED   Candida tropicalis NOT DETECTED NOT DETECTED   Cryptococcus neoformans/gattii NOT DETECTED NOT DETECTED   CTX-M ESBL NOT DETECTED NOT DETECTED   Carbapenem resistance IMP NOT DETECTED NOT DETECTED   Carbapenem resistance KPC NOT DETECTED NOT DETECTED   Carbapenem resistance NDM NOT DETECTED NOT DETECTED   Carbapenem resist OXA 48 LIKE NOT DETECTED NOT DETECTED   Carbapenem resistance VIM NOT DETECTED NOT DETECTED    Juliette Alcide, PharmD, BCPS, BCIDP Work Cell: [phone] 10/21/2023 11:28 AM

## 2023-10-21 NOTE — Assessment & Plan Note (Addendum)
Vitals:   10/20/23 2045 10/20/23 2240 10/20/23 2335 10/20/23 2340  BP: (!) 119/42 117/63 (!) 94/52 (!) 78/50   10/21/23 0000 10/21/23 0040 10/21/23 0100  BP: (!) 81/50 (!) 78/50 (!) 78/48  No home blood pressure medications.

## 2023-10-21 NOTE — Progress Notes (Signed)
Pharmacy Antibiotic Note  Sarah Freeman is a 89 y.o. female admitted on 10/21/2023 with bacteremia and concern for UTI and stercoral colitis. Pharmacy has been consulted for zosyn dosing  -Zosyn was started at [place] (last dose ~ noon today) -Blood cultures with GNR and BCID with Ecoli (no resistance detected) -CrCl ~ 35  Plan: -Zosyn 3,357gm IV q8h -Will follow renal function, cultures and clinical progress      Temp (24hrs), Avg:98.4 F (36.9 C), Min:97.3 F (36.3 C), Max:99.6 F (37.6 C)  Recent Labs  Lab 10/20/23 2044 10/20/23 2305 10/21/23 0238 10/21/23 0504  WBC 20.0*  --   --  17.7*  CREATININE 0.81  --   --  0.76  LATICACIDVEN 2.5* 4.1* 2.7* 1.7    Estimated Creatinine Clearance: 35.9 mL/min (by C-G formula based on SCr of 0.76 mg/dL).    Allergies  Allergen Reactions   Statins Other (See Comments)    Leg cramps    Bacitracin Other (See Comments)    Doesn't help wound to heal   Neomycin-Polymyxin-Gramicidin Other (See Comments)    Pt states wounds do not heal with neosporin     Antimicrobials this admission: 11/22 zosyn 11/22 vanc z1  Dose adjustments this admission:   Microbiology results: 11/21 Blood cultures with GNR and BCID with Ecoli (no resistance detected)  Thank you for allowing pharmacy to be a part of this patient's care.  Harland German, PharmD Clinical Pharmacist **Pharmacist phone directory can now be found on [email or web address] ([email or web address]).  Listed under [facility].

## 2023-10-21 NOTE — Consult Note (Signed)
Arlyss Repress, MD [address]  [address]  [place], [place] [address]  Main: [phone]  Fax: [phone] Pager: [phone]   Consultation  Referring Provider:     No ref. provider found Primary Care Physician:  Dorcas Carrow, DO Primary Gastroenterologist: Gentry Fitz        Reason for Consultation: Elevated LFTs, dilated CBD    Date of Admission:  10/20/2023 Date of Consultation:  10/21/2023         HPI:   Sarah Freeman is a 89 y.o. female with history of hypertension, hypothyroidism, stage III CKD, possible dementia, history of CVA secondary to ischemic infarcts in May 2024 as well as July 2024 resulting in left hemiplegia on DAPT presented from [facility] last night secondary to fever and projectile vomiting.  She is DNR, history of frequent UTIs.  She is admitted to ICU secondary to septic shock, urinalysis was concerning for UTI, LFTs were elevated alkaline phosphatase 309, AST 431, ALT 123, T. bili 1.1, lactic acid 2.5, normal PT/INR, WBC count to 20, normal hemoglobin, platelets 475.  She started on broad-spectrum antibiotics, IV fluids as well as vasopressor support.  CT abdomen and pelvis with contrast revealed postcholecystectomy, intra and extrahepatic biliary ductal dilatation with CBD measuring 15 mm and tapers distally.  No visible choledocholithiasis.  Pancreatic parenchymal atrophy.  GI is consulted to evaluate for dilated CBD.  When I interviewed the patient, she was coherent to self, her son was bedside, patient's nurse was bedside who reported that he is able to wean down Levophed maintaining maps.  She was complaining of pain in left lower quadrant, she was found to have fecal impaction yesterday in the ER, which was disimpacted, subsequently had 2 bowel movements.  She is on heart healthy diet.  Blood cultures were positive for E. coli and enterobacteria. WBC count has mildly improved since yesterday, thrombocytosis has resolved.  NSAIDs:  None  Antiplts/Anticoagulants/Anti thrombotics: Aspirin and Plavix  GI Procedures: None  Past Medical History:  Diagnosis Date   Hyperlipidemia    Hypothyroid    Stroke [facility])    Urinary incontinence     Past Surgical History:  Procedure Laterality Date   BREAST BIOPSY Left 2000?   neg. dr. Rutherford Nail office   CHOLECYSTECTOMY  1964   COLONOSCOPY     Dr Maryruth Bun   DILATION AND CURETTAGE OF UTERUS     EYE SURGERY     LOOP RECORDER INSERTION N/A 02/09/2018   Procedure: LOOP RECORDER INSERTION;  Surgeon: Duke Salvia, MD;  Location: [facility] INVASIVE CV LAB;  Service: Cardiovascular;  Laterality: N/A;   SKIN CANCER EXCISION     face   TEE WITHOUT CARDIOVERSION N/A 02/09/2018   Procedure: TRANSESOPHAGEAL ECHOCARDIOGRAM (TEE);  Surgeon: Antonieta Iba, MD;  Location: [facility] ORS;  Service: Cardiovascular;  Laterality: N/A;   TOTAL ABDOMINAL HYSTERECTOMY W/ BILATERAL SALPINGOOPHORECTOMY     age 6     Current Facility-Administered Medications:    0.9 %  sodium chloride infusion, 250 mL, Intravenous, Continuous, Rust-Chester, Cecelia Byars, NP, Held at 10/21/23 0351   aspirin EC tablet 81 mg, 81 mg, Oral, Daily, Irena Cords V, MD, 81 mg at 10/21/23 0919   budesonide (PULMICORT) nebulizer solution 0.5 mg, 0.5 mg, Nebulization, BID, Amin, Ankit C, MD   Chlorhexidine Gluconate Cloth 2 % PADS 6 each, 6 each, Topical, Daily, Gertha Calkin, MD, 6 each at 10/21/23 0324   clopidogrel (PLAVIX) tablet 75 mg, 75 mg, Oral,  Daily, Gertha Calkin, MD, 75 mg at 10/21/23 0919   donepezil (ARICEPT) tablet 5 mg, 5 mg, Oral, Daily, Irena Cords V, MD, 5 mg at 10/21/23 0919   guaiFENesin (ROBITUSSIN) 100 MG/5ML liquid 5 mL, 5 mL, Oral, Q4H PRN, Amin, Ankit C, MD   heparin injection 5,000 Units, 5,000 Units, Subcutaneous, Q12H, Gertha Calkin, MD, 5,000 Units at 10/21/23 0919   hydrALAZINE (APRESOLINE) injection 10 mg, 10 mg, Intravenous, Q4H PRN, Amin, Ankit C, MD   ipratropium-albuterol (DUONEB) 0.5-2.5 (3)  MG/3ML nebulizer solution 3 mL, 3 mL, Nebulization, Q4H PRN, Amin, Ankit C, MD   lactated ringers infusion, , Intravenous, Continuous, Amin, Ankit C, MD, Last Rate: 150 mL/hr at 10/21/23 1325, Infusion Verify at 10/21/23 1325   levothyroxine (SYNTHROID) tablet 50 mcg, 50 mcg, Oral, Q0600, Gertha Calkin, MD   midodrine (PROAMATINE) tablet 5 mg, 5 mg, Oral, TID WC, Harlon Ditty D, NP, 5 mg at 10/21/23 1135   mirtazapine (REMERON) tablet 7.5 mg, 7.5 mg, Oral, QHS, Patel, Ekta V, MD   norepinephrine (LEVOPHED) 4mg  in (0.016 mg/mL) premix infusion, 2-10 mcg/min, Intravenous, Titrated, Rust-Chester, Britton L, NP, Last Rate: 7.5 mL/hr at 10/21/23 1325, 2 mcg/min at 10/21/23 1325   ondansetron (ZOFRAN) injection 4 mg, 4 mg, Intravenous, Q6H PRN, Amin, Ankit C, MD   Oral care mouth rinse, 15 mL, Mouth Rinse, 4 times per day, Vida Rigger, MD   Oral care mouth rinse, 15 mL, Mouth Rinse, PRN, Karna Christmas, Fuad, MD   pantoprazole (PROTONIX) EC tablet 40 mg, 40 mg, Oral, Daily, Irena Cords V, MD, 40 mg at 10/21/23 0919   piperacillin-tazobactam (ZOSYN) IVPB 3.375 g, 3.375 g, Intravenous, Q8H, Irena Cords V, MD, Last Rate: 12.5 mL/hr at 10/21/23 1325, Infusion Verify at 10/21/23 1325   QUEtiapine (SEROQUEL) tablet 25 mg, 25 mg, Oral, QPM, Patel, Ekta V, MD   senna-docusate (Senokot-S) tablet 1 tablet, 1 tablet, Oral, QHS PRN, Amin, Ankit C, MD   traZODone (DESYREL) tablet 50 mg, 50 mg, Oral, QHS PRN, Amin, Ankit C, MD   Family History  Problem Relation Age of Onset   Cancer Father        prostate   Stroke Father    Hypertension Sister    Hyperlipidemia Daughter    Heart disease Son    Diabetes Maternal Grandmother    Heart disease Maternal Grandmother    Heart disease Maternal Grandfather    Heart disease Daughter    CAD Mother    Failure to thrive Mother    Breast cancer Neg Hx      Social History   Tobacco Use   Smoking status: Never    Passive exposure: Never   Smokeless  tobacco: Never  Vaping Use   Vaping status: Never Used  Substance Use Topics   Alcohol use: No    Alcohol/week: 0.0 standard drinks of alcohol   Drug use: No    Allergies as of 10/20/2023 - Review Complete 10/20/2023  Allergen Reaction Noted   Statins Other (See Comments) 02/22/2017   Bacitracin Other (See Comments) 04/29/2020   Neomycin-polymyxin-gramicidin Other (See Comments) 09/01/2016    Review of Systems:    All systems reviewed and negative except where noted in HPI.   Physical Exam:  Vital signs in last 24 hours: Temp:  [97.3 F (36.3 C)-102.7 F (39.3 C)] 97.3 F (36.3 C) (11/22 1200) Pulse Rate:  [56-119] 73 (11/22 1315) Resp:  [9-36] 24 (11/22 1315) BP: (78-147)/(40-105) 105/51 (11/22 1315) SpO2:  [  92 %-100 %] 99 % (11/22 1315) Weight:  [67 kg-68 kg] 67 kg (11/22 0235) Last BM Date : 10/21/23 General:   Pleasant, cooperative in NAD Head:  Normocephalic and atraumatic. Eyes:   No icterus.   Conjunctiva pink. PERRLA. Ears:  Normal auditory acuity. Neck:  Supple; no masses or thyroidomegaly Lungs: Respirations even and unlabored. Lungs clear to auscultation bilaterally.   No wheezes, crackles, or rhonchi.  Heart:  Regular rate and rhythm;  Without murmur, clicks, rubs or gallops Abdomen:  Soft, nondistended, left lower quadrant tenderness. Normal bowel sounds. No appreciable masses or hepatomegaly.  No rebound or guarding.  Rectal:  Not performed. Msk:  Symmetrical without gross deformities.  Strength generalized weakness Extremities:  Without edema, cyanosis or clubbing. Neurologic:  Alert and oriented x1; left hemiplegia Skin:  Intact without significant lesions or rashes. Psych:  Alert and cooperative. Normal affect.  LAB RESULTS:    Latest Ref Rng & Units 10/21/2023    5:04 AM 10/20/2023    8:44 PM 06/09/2023    8:00 AM  CBC  WBC 4.0 - 10.5 K/uL 17.7  20.0  13.5   Hemoglobin 12.0 - 15.0 g/dL 95.2  84.1  32.4   Hematocrit 36.0 - 46.0 % 29.8  37.0  39.8    Platelets 150 - 400 K/uL 383  475  287     BMET    Latest Ref Rng & Units 10/21/2023    5:04 AM 10/20/2023    8:44 PM 06/09/2023    8:00 AM  BMP  Glucose 70 - 99 mg/dL 401  027  253   BUN 8 - 23 mg/dL 10  10  10    Creatinine 0.44 - 1.00 mg/dL 6.64  4.03  4.74   Sodium 135 - 145 mmol/L 136  134  134   Potassium 3.5 - 5.1 mmol/L 3.5  3.8  3.8   Chloride 98 - 111 mmol/L 103  98  102   CO2 22 - 32 mmol/L 24  26  22    Calcium 8.9 - 10.3 mg/dL 8.1  8.2  8.9     LFT    Latest Ref Rng & Units 10/21/2023    5:04 AM 10/20/2023    8:44 PM 06/06/2023    4:47 AM  Hepatic Function  Total Protein 6.5 - 8.1 g/dL 5.7  6.8  6.4   Albumin 3.5 - 5.0 g/dL 2.4  2.5  2.9   AST 15 - 41 U/L 357  431  43   ALT 0 - 44 U/L 139  123  38   Alk Phosphatase 38 - 126 U/L 284  309  51   Total Bilirubin <1.2 mg/dL 1.3  1.1  0.5      STUDIES: CT ABDOMEN PELVIS W CONTRAST  Result Date: 10/20/2023 CLINICAL DATA:  89 year old, provided history of sepsis. Fever with vomiting. EXAM: CT ABDOMEN AND PELVIS WITH CONTRAST TECHNIQUE: Multidetector CT imaging of the abdomen and pelvis was performed using the standard protocol following bolus administration of intravenous contrast. RADIATION DOSE REDUCTION: This exam was performed according to the departmental dose-optimization program which includes automated exposure control, adjustment of the mA and/or kV according to patient size and/or use of iterative reconstruction technique. CONTRAST:  OMNIPAQUE IOHEXOL 300 MG/ML  SOLN COMPARISON:  None Available. FINDINGS: Lower chest: Scattered atelectasis and chronic lung disease with subpleural reticulation. Coronary artery calcifications. Hepatobiliary: Patient is post cholecystectomy. There is intra and extrahepatic biliary ductal dilatation. The common bile duct measures 15  mm at the porta hepatis and tapers distally. No visible choledocholithiasis. No suspicious hepatic lesion. Pancreas: Parenchymal atrophy. No ductal  dilatation or inflammation. Spleen: Normal in size without focal abnormality. Adrenals/Urinary Tract: No adrenal nodule. Prominence of the renal collecting systems but no hydronephrosis. Symmetric renal enhancement and excretion on delayed phase imaging. No renal calculi or suspicious renal abnormality. Physiologically distended urinary bladder, no bladder wall thickening. Stomach/Bowel: Motion artifact limits assessment. Small hiatal hernia with fluid in the distal esophagus. Fluid within the stomach without gastric inflammation or wall thickening. No small bowel obstruction or inflammation. The appendix is not definitively seen. Moderate volume of stool throughout the colon. There is colonic redundancy. Colonic diverticulosis without focal diverticulitis. There is moderate stool distention of the rectum, rectal distention of 7.2 cm. Mild rectal wall thickening and perirectal edema. Vascular/Lymphatic: Aortic atherosclerosis without aneurysm. No acute vascular findings. No abdominopelvic adenopathy. Reproductive: Status post hysterectomy. No adnexal masses. Other: No free air or ascites. Musculoskeletal: Scoliosis and degenerative change in the lumbar spine. There are chronic calcifications in the left gluteus medius muscle posterior to the left acetabulum. IMPRESSION: 1. Moderate stool distention of the rectum with mild rectal wall thickening and perirectal edema, suspicious for stercoral colitis. 2. Moderate volume of stool throughout the colon, constipation suspected. Colonic diverticulosis without diverticulitis. 3. Intra and extrahepatic biliary ductal dilatation, likely due to post cholecystectomy state. Recommend correlation with liver function tests. If LFTs are normal, no further workup is recommended. If LFTs are elevated, MRCP is the workup of choice, however not recommended in this patient who cannot hold still for CT. 4. Small hiatal hernia with fluid in the distal esophagus, can be seen with  gastroesophageal reflux. Aortic Atherosclerosis (ICD10-I70.0). Electronically Signed   By: Narda Rutherford M.D.   On: 10/20/2023 22:35   DG Chest 1 View  Result Date: 10/20/2023 CLINICAL DATA:  Fever, vomiting EXAM: CHEST  1 VIEW COMPARISON:  12/21/2018 FINDINGS: Single frontal view of the chest demonstrates an unremarkable cardiac silhouette. Lung volumes are diminished, without airspace disease, effusion, or pneumothorax. No acute bony abnormalities. IMPRESSION: 1. Low lung volumes.  No acute process. Electronically Signed   By: Sharlet Salina M.D.   On: 10/20/2023 21:16      Impression / Plan:   ALYSIANA DEVILLIER is a 89 y.o. female with dementia, hypertension, hyperlipidemia, chronic kidney disease, s/p cholecystectomy, acute stroke resulting in left hemiplegia in 05/2023 on DAPT is admitted with septic shock, E. coli and Enterobacterales bacteremia.  Urinalysis concerning for UTI and imaging revealed dilated intra and extrahepatic biliary ductal dilation without any evidence of biliary obstruction  Elevated LFTs: Mixed pattern In setting of septic shock, due to presence of gram-negative bacteremia and dilated CBD raises the clinical suspicion for ascending cholangitis or elevated LFTs are secondary to septic shock from UTI No evidence of gallstone pancreatitis Given CT findings and considering patient's age, stroke and overall medical condition, recommend noninvasive testing such as MRCP to evaluate for choledocholithiasis before proceeding with ERCP If ERCP is indicated, patient is on Plavix, can only perform biliary stent placement over biliary sphincterotomy Further recommendations about biliary decompression pending MRCP If patient remains in septic shock, she will either need to be transferred to tertiary care center for urgent ERCP or undergo PTC drain placement by IR Diet as tolerated Adequate hydration Monitor LFTs, avoid hepatotoxic agents Continue Zosyn, antibiotic sensitivity on  blood cultures is pending Discussed my recommendations with patient's son who is in agreement with the plan  Thank you for involving me in the care of this patient.  Dr. Tobi Bastos will cover from tomorrow    LOS: 0 days   Lannette Donath, MD  10/21/2023, 3:10 PM    Note: This dictation was prepared with Dragon dictation along with smaller phrase technology. Any transcriptional errors that result from this process are unintentional.

## 2023-10-21 NOTE — TOC Initial Note (Signed)
Transition of Care [facility]) - Initial/Assessment Note    Patient Details  Name: Sarah Freeman MRN: [id number] Date of Birth: 06-10-1931  Transition of [facility]) CM/SW Contact:    Liliana Cline, LCSW Phone Number: 10/21/2023, 1:47 PM  Clinical Narrative:                 Spoke with patient's daughter Kennon Rounds by phone. Patient is from [facility], [name] reports patient resides there for long term care and their plan is for her to return to [facility] when she is medically ready. [name] repots patient uses a wheelchair at baseline. CSW attempted to reach out to Tiffany at [facility] to confirm, awaiting response.  Expected Discharge Plan: Skilled Nursing Facility Barriers to Discharge: Continued Medical Work up   Patient Goals and CMS Choice Patient states their goals for this hospitalization and ongoing recovery are:: return to [facility] LTC [email or web address] Compare Post Acute Care list provided to:: Patient Represenative (must comment) Choice offered to / list presented to : Adult Children      Expected Discharge Plan and Services       Living arrangements for the past 2 months: Single Family Home                                      Prior Living Arrangements/Services Living arrangements for the past 2 months: Single Family Home Lives with:: Facility Resident              Current home services: DME    Activities of Daily Living      Permission Sought/Granted                  Emotional Assessment       Orientation: : Fluctuating Orientation (Suspected and/or reported Sundowners) Alcohol / Substance Use: Not Applicable Psych Involvement: No (comment)  Admission diagnosis:  Fever [R50.9] Sepsis with encephalopathy without septic shock, due to unspecified organism (HCC) [A41.9, R65.20, G93.41] Patient Active Problem List   Diagnosis Date Noted   Septic shock (HCC) 10/21/2023   Elevated LFTs 10/21/2023   Hemiparesis  affecting left side as late effect of cerebrovascular accident (CVA) (HCC) 10/21/2023   Fever 10/21/2023   Stroke-like symptoms 06/06/2023   Left hemiplegia (HCC) 05/19/2023   History of CVA with residual deficit 05/17/2023   Dyslipidemia 04/06/2023   Memory loss 12/29/2021   Aortic atherosclerosis (HCC) 02/27/2021   SVT (supraventricular tachycardia) (HCC) 10/11/2018   HTN (hypertension) 10/11/2018   Chronic kidney disease, stage 3 (HCC) 05/24/2018   Morbid obesity (HCC) 02/02/2018   Carotid stenosis 02/01/2018   Left arm weakness 01/18/2018   UTI (urinary tract infection) 11/04/2017   Statin intolerance 06/14/2017   Counseling regarding advanced directives and goals of care 02/22/2017   Weakness of left leg 02/25/2016   TIA (transient ischemic attack) 02/15/2016   Osteopenia 01/29/2016   Menopausal state 01/29/2016   Hypothyroidism 01/29/2016   GERD (gastroesophageal reflux disease) 01/29/2016   Hypercholesterolemia 01/29/2016   Lumbar spinal stenosis 01/29/2016   Stress incontinence 01/29/2016   Breast microcalcification, mammographic 06/17/2015   PCP:  Dorcas Carrow, DO Pharmacy:   [facility] - [place], [place] - [address] [place] [address] Phone: [phone] Fax: [phone]  [id number] - [place], [place] - [address] AT [facility] OF [address] [address] [place] [address] Phone: [phone] Fax: [phone]     Social Determinants of Health (SDOH) Social History: SDOH Screenings   Food Insecurity: No Food Insecurity (10/21/2023)  Housing: Low Risk  (10/21/2023)  Transportation Needs: No Transportation Needs (10/21/2023)  Utilities: Not At Risk (10/21/2023)  Alcohol Screen: Low Risk  (02/01/2023)  Depression (PHQ2-9): Low Risk  (05/17/2023)  Financial Resource Strain: Low Risk  (02/01/2023)  Physical Activity: Insufficiently Active (02/01/2023)  Social Connections:  Moderately Isolated (02/01/2023)  Stress: No Stress Concern Present (02/01/2023)  Tobacco Use: Low Risk  (10/20/2023)   SDOH Interventions:     Readmission Risk Interventions    10/21/2023    1:46 PM  Readmission Risk Prevention Plan  Transportation Screening Complete  PCP or Specialist Appt within 3-5 Days Complete  HRI or Home Care Consult Complete  Social Work Consult for Recovery Care Planning/Counseling Complete  Palliative Care Screening Complete  Medication Review Oceanographer) Complete

## 2023-10-21 NOTE — H&P (Signed)
NAME:  Sarah Freeman, MRN:  [id number], DOB:  12/06/1930, LOS: 0 ADMISSION DATE:  (Not on file), CONSULTATION DATE:  10/21/23 REFERRING MD:  [facility] CHIEF COMPLAINT:  Fever, Emesis   History of Present Illness:  Sarah Freeman is a 89 y.o. female who has a PMH as below and who resides at [facility]. She was taken to [facility] 11/21 for fever and vomiting. She had apparently otherwise been in her usual state of health without any additional symptoms.   She was found to have sepsis that progressed to shock physiology requiring low dose vasopressor support. Workup suggested UTI. She was started on Vanc/Zosyn and cultures were obtained. BCx's positive for E.coli and enterobacter.  She had CT A/P that revealed moderate stool burden throughout with distention of th rectum, intra and extrahepatic biliary ductal dilatation with CBD measuring 15mm, no visible choledocholithiasis. She reportedly had fecal disimpaction in the ED (I do not see note of this).  On 11/22, she was seen by GI and had MRCP performed which revealed intrahepatic, common hepatic, and CBD dilatation - ? ascending cholangitis.  GI recommended transfer to [facility] for further GI evaluation and for consideration of ERCP.  Given her shock state and low dose NE requirements, PCCM was consulted for transfer to [facility].  Of note, she has a DNR in place and this was confirmed on admission.   Pertinent  Medical History:  has Breast microcalcification, mammographic; Osteopenia; Menopausal state; Hypothyroidism; GERD (gastroesophageal reflux disease); Hypercholesterolemia; Lumbar spinal stenosis; Stress incontinence; TIA (transient ischemic attack); Counseling regarding advanced directives and goals of care; Statin intolerance; UTI (urinary tract infection); Left arm weakness; Carotid stenosis; Morbid obesity (HCC); Chronic kidney disease, stage 3 (HCC); SVT (supraventricular tachycardia) (HCC); HTN (hypertension); Weakness of left leg; Aortic  atherosclerosis (HCC); Memory loss; Dyslipidemia; History of CVA with residual deficit; Left hemiplegia (HCC); Stroke-like symptoms; Septic shock (HCC); Elevated LFTs; Hemiparesis affecting left side as late effect of cerebrovascular accident (CVA) (HCC); Fever; Dilated cbd, acquired; and Pressure injury of skin on their problem list.  Significant Hospital Events: Including procedures, antibiotic start and stop dates in addition to other pertinent events   11/21 admit 11/22 transfer to [facility]  Interim History / Subjective:  Comfortable. Denies pain. Feels "fine". On 2 NE with MAP 70s  Objective:  There were no vitals taken for this visit.        Intake/Output Summary (Last 24 hours) at 10/21/2023 1945 Last data filed at 10/21/2023 1855 Gross per 24 hour  Intake 6108.78 ml  Output 2800 ml  Net 3308.78 ml   There were no vitals filed for this visit.  Examination: General: Elderly female, resting in bed, in NAD. Neuro: A&O x 3, no deficits. HEENT: [place]/AT. Sclerae anicteric. EOMI. Cardiovascular: RRR, no M/R/G.  Lungs: Respirations even and unlabored.  CTA bilaterally, No W/R/R. Abdomen: BS x 4, soft, generalized tenderness to deep palpation only. Musculoskeletal: No gross deformities, 1+ edema.  Skin: Intact, warm, no rashes.   Labs/imaging personally reviewed:  CT A/P 11/21 >moderate stool burden throughout with distention of th rectum, intra and extrahepatic biliary ductal dilatation with CBD measuring 15mm, no visible choledocholithiasis. MRCP 11/22 > mild intrahepatic biliary, common hepatic duct, and common bile duct dilatation. Subtle thickening along posterior wall of CBD, possibly tiny gallstones. Trace ascites, trace right pleural effusion with possible RLL PNA, mild cardiomegaly, sigmoid diverticulosis.  Assessment & Plan:   E.coli and Enterobacter bacteremia with Septic shock (POA) - likely secondary to UTI +/-  biliary process. Urosepsis with septic shock (POA). -  Continue Zosyn until sensitivities result. - Continue gentle fluids. - Albumin 25% x 1. - Continue Levophed as needed for goal MAP > 65. - Continue Midodrine.  Transaminitis with intrahepatic, common hepatic, and CBD dilatation - ? ascending cholangitis. No evidence of gallstone pancreatitis. - Day team to please consult GI in AM for MRCP and possible ERCP ([facility] aware of pt). - Holding PTA Simvastatin.  Constipation. - Stool softeners. - Continue PTA Miralax.  Hx hypothyroidism. - Continue PTA Synthroid.  Hx CVA, mild cognitive impairment. - Continue PTA ASA, Plavix, Aricept, Remeron, Seroquel.   Best practice (evaluated daily):  Diet/type: NPO DVT prophylaxis: prophylactic heparin  Pressure ulcer(s). Clinically undetermined if the pressure injury was present on admission GI prophylaxis: PPI Lines: N/A Foley:  Yes, and it is still needed Code Status:  DNR Last date of multidisciplinary goals of care discussion: None yet.  Labs   CBC: Recent Labs  Lab 10/20/23 2044 10/21/23 0504  WBC 20.0* 17.7*  NEUTROABS 14.8*  --   HGB 12.0 10.0*  HCT 37.0 29.8*  MCV 97.4 94.0  PLT 475* 383    Basic Metabolic Panel: Recent Labs  Lab 10/20/23 2044 10/21/23 0504  NA 134* 136  K 3.8 3.5  CL 98 103  CO2 26 24  GLUCOSE 156* 169*  BUN 10 10  CREATININE 0.81 0.76  CALCIUM 8.2* 8.1*  MG  --  1.9  PHOS  --  3.5   GFR: Estimated Creatinine Clearance: 35.9 mL/min (by C-G formula based on SCr of 0.76 mg/dL). Recent Labs  Lab 10/20/23 2044 10/20/23 2305 10/21/23 0238 10/21/23 0504  PROCALCITON  --   --   --  5.69  WBC 20.0*  --   --  17.7*  LATICACIDVEN 2.5* 4.1* 2.7* 1.7    Liver Function Tests: Recent Labs  Lab 10/20/23 2044 10/21/23 0504  AST 431* 357*  ALT 123* 139*  ALKPHOS 309* 284*  BILITOT 1.1 1.3*  PROT 6.8 5.7*  ALBUMIN 2.5* 2.4*   No results for input(s): "LIPASE", "AMYLASE" in the last 168 hours. No results for input(s): "AMMONIA" in the  last 168 hours.  ABG    Component Value Date/Time   TCO2 25 06/06/2023 0450     Coagulation Profile: Recent Labs  Lab 10/20/23 2044 10/21/23 0504  INR 1.1 1.3*    Cardiac Enzymes: No results for input(s): "CKTOTAL", "CKMB", "CKMBINDEX", "TROPONINI" in the last 168 hours.  HbA1C: Hgb A1c MFr Bld  Date/Time Value Ref Range Status  10/21/2023 02:38 AM 5.8 (H) 4.8 - 5.6 % Final    Comment:    (NOTE) Pre diabetes:          5.7%-6.4%  Diabetes:              >6.4%  Glycemic control for   <7.0% adults with diabetes   04/06/2023 11:36 AM 6.0 (H) 4.8 - 5.6 % Final    Comment:    (NOTE) Pre diabetes:          5.7%-6.4%  Diabetes:              >6.4%  Glycemic control for   <7.0% adults with diabetes     CBG: Recent Labs  Lab 10/21/23 0155  GLUCAP 148*    Review of Systems:   All negative; except for those that are bolded, which indicate positives.  Constitutional: weight loss, weight gain, night sweats, fevers, chills, fatigue, weakness.  HEENT:  headaches, sore throat, sneezing, nasal congestion, post nasal drip, difficulty swallowing, tooth/dental problems, visual complaints, visual changes, ear aches. Neuro: difficulty with speech, weakness, numbness, ataxia. CV:  chest pain, orthopnea, PND, swelling in lower extremities, dizziness, palpitations, syncope.  Resp: cough, hemoptysis, dyspnea, wheezing. GI: heartburn, indigestion, abdominal pain, nausea, vomiting, diarrhea, constipation, change in bowel habits, loss of appetite, hematemesis, melena, hematochezia.  GU: dysuria, change in color of urine, urgency or frequency, flank pain, hematuria. MSK: joint pain or swelling, decreased range of motion. Psych: change in mood or affect, depression, anxiety, suicidal ideations, homicidal ideations. Skin: rash, itching, bruising.   Past Medical History:  She,  has a past medical history of Hyperlipidemia, Hypothyroid, Stroke (HCC), and Urinary incontinence.    Surgical History:   Past Surgical History:  Procedure Laterality Date   BREAST BIOPSY Left 2000?   neg. dr. Rutherford Nail office   CHOLECYSTECTOMY  1964   COLONOSCOPY     Dr Maryruth Bun   DILATION AND CURETTAGE OF UTERUS     EYE SURGERY     LOOP RECORDER INSERTION N/A 02/09/2018   Procedure: LOOP RECORDER INSERTION;  Surgeon: Duke Salvia, MD;  Location: [facility] INVASIVE CV LAB;  Service: Cardiovascular;  Laterality: N/A;   SKIN CANCER EXCISION     face   TEE WITHOUT CARDIOVERSION N/A 02/09/2018   Procedure: TRANSESOPHAGEAL ECHOCARDIOGRAM (TEE);  Surgeon: Antonieta Iba, MD;  Location: [facility] ORS;  Service: Cardiovascular;  Laterality: N/A;   TOTAL ABDOMINAL HYSTERECTOMY W/ BILATERAL SALPINGOOPHORECTOMY     age 23     Social History:   reports that she has never smoked. She has never been exposed to tobacco smoke. She has never used smokeless tobacco. She reports that she does not drink alcohol and does not use drugs.   Family History:  Her family history includes CAD in her mother; Cancer in her father; Diabetes in her maternal grandmother; Failure to thrive in her mother; Heart disease in her daughter, maternal grandfather, maternal grandmother, and son; Hyperlipidemia in her daughter; Hypertension in her sister; Stroke in her father. There is no history of Breast cancer.   Allergies Allergies  Allergen Reactions   Statins Other (See Comments)    Leg cramps    Bacitracin Other (See Comments)    Doesn't help wound to heal   Neomycin-Polymyxin-Gramicidin Other (See Comments)    Pt states wounds do not heal with neosporin      Home Medications  Prior to Admission medications   Medication Sig Start Date End Date Taking? Authorizing Provider  alendronate (FOSAMAX) 70 MG/75ML solution Take 70 mg by mouth every 7 (seven) days. 08/22/23   [provider]  aspirin EC 81 MG tablet Take 81 mg by mouth daily. Swallow whole.    [provider]  cholecalciferol (VITAMIN  D3) 25 MCG (1000 UNIT) tablet Take 1,000 Units by mouth daily.    [provider]  clopidogrel (PLAVIX) 75 MG tablet Take 75 mg by mouth daily. 10/11/23   [provider]  cyanocobalamin 1000 MCG tablet Take 1 tablet (1,000 mcg total) by mouth daily. 04/19/23   Amin, Ankit C, MD  cyclobenzaprine (FLEXERIL) 5 MG tablet Take 5 mg by mouth 3 (three) times daily as needed. 09/22/23   [provider]  donepezil (ARICEPT) 5 MG tablet Take 1 tablet (5 mg total) by mouth at bedtime. Patient taking differently: Take 5 mg by mouth daily. 01/04/23   Johnson, Megan P, DO  gabapentin (NEURONTIN) 100 MG capsule TAKE 2  CAPSULES BY MOUTH EVERY MORNING AND 1 CAPSULE AT 1 PM 07/05/23   Johnson, Megan P, DO  levothyroxine (SYNTHROID) 50 MCG tablet Take 1 tablet (50 mcg total) by mouth daily before breakfast. 01/10/23   Laural Benes, Megan P, DO  mirtazapine (REMERON) 7.5 MG tablet Take 7.5 mg by mouth at bedtime. 09/30/23   [provider]  nitrofurantoin, macrocrystal-monohydrate, (MACROBID) 100 MG capsule Take 1 capsule (100 mg total) by mouth at bedtime. 11/08/22   Alfredo Martinez, MD  pantoprazole (PROTONIX) 40 MG tablet Take 1 tablet (40 mg total) by mouth daily. 05/17/23   Johnson, Megan P, DO  polyethylene glycol (MIRALAX / GLYCOLAX) 17 g packet Take 17 g by mouth daily.    [provider]  PROLIA 60 MG/ML SOSY injection Inject 60 mg into the skin every 6 (six) months. 05/22/20   [provider]  QUEtiapine (SEROQUEL) 25 MG tablet Take 0.5-1 tablets (12.5-25 mg total) by mouth every evening. Patient taking differently: Take 25 mg by mouth every evening. 05/17/23   Johnson, Megan P, DO  simvastatin (ZOCOR) 20 MG tablet Take 1 tablet (20 mg total) by mouth daily at 6 PM. Patient taking differently: Take 20 mg by mouth daily. 05/17/23   Olevia Perches P, DO     Critical care time: 40 min.   Rutherford Guys, PA - [facility] For pager  details, please see AMION or use Epic chat  After 1900, please call [facility] for cross coverage needs 10/21/2023, 10:16 PM

## 2023-10-21 NOTE — Assessment & Plan Note (Addendum)
IV PPI therapy for GERD as well as GI prophylaxis.

## 2023-10-22 DIAGNOSIS — K8021 Calculus of gallbladder without cholecystitis with obstruction: Secondary | ICD-10-CM | POA: Diagnosis not present

## 2023-10-22 DIAGNOSIS — A419 Sepsis, unspecified organism: Secondary | ICD-10-CM | POA: Diagnosis not present

## 2023-10-22 DIAGNOSIS — E039 Hypothyroidism, unspecified: Secondary | ICD-10-CM | POA: Diagnosis not present

## 2023-10-22 DIAGNOSIS — R7989 Other specified abnormal findings of blood chemistry: Secondary | ICD-10-CM | POA: Diagnosis not present

## 2023-10-22 DIAGNOSIS — R6521 Severe sepsis with septic shock: Secondary | ICD-10-CM | POA: Diagnosis not present

## 2023-10-22 DIAGNOSIS — R7881 Bacteremia: Secondary | ICD-10-CM

## 2023-10-22 DIAGNOSIS — R7401 Elevation of levels of liver transaminase levels: Secondary | ICD-10-CM | POA: Diagnosis not present

## 2023-10-22 LAB — PHOSPHORUS: Phosphorus: 3.6 mg/dL (ref 2.5–4.6)

## 2023-10-22 LAB — BASIC METABOLIC PANEL
Anion gap: 6 (ref 5–15)
BUN: 5 mg/dL — ABNORMAL LOW (ref 8–23)
CO2: 24 mmol/L (ref 22–32)
Calcium: 7.8 mg/dL — ABNORMAL LOW (ref 8.9–10.3)
Chloride: 108 mmol/L (ref 98–111)
Creatinine, Ser: 0.78 mg/dL (ref 0.44–1.00)
GFR, Estimated: >60 mL/min (ref >60)
Glucose, Bld: 73 mg/dL (ref 70–99)
Potassium: 3 mmol/L — ABNORMAL LOW (ref 3.5–5.1)
Sodium: 138 mmol/L (ref 135–145)

## 2023-10-22 LAB — CBC
HCT: 27.5 % — ABNORMAL LOW (ref 36.0–46.0)
Hemoglobin: 9.3 g/dL — ABNORMAL LOW (ref 12.0–15.0)
MCH: 32.3 pg (ref 26.0–34.0)
MCHC: 33.8 g/dL (ref 30.0–36.0)
MCV: 95.5 fL (ref 80.0–100.0)
Platelets: 318 10*3/uL (ref 150–400)
RBC: 2.88 MIL/uL — ABNORMAL LOW (ref 3.87–5.11)
RDW: 13.5 % (ref 11.5–15.5)
WBC: 9 10*3/uL (ref 4.0–10.5)
nRBC: 0 % (ref 0.0–0.2)

## 2023-10-22 LAB — GLUCOSE, CAPILLARY
Glucose-Capillary: 70 mg/dL (ref 70–99)
Glucose-Capillary: 72 mg/dL (ref 70–99)
Glucose-Capillary: 77 mg/dL (ref 70–99)
Glucose-Capillary: 92 mg/dL (ref 70–99)

## 2023-10-22 LAB — HEPATIC FUNCTION PANEL
ALT: 97 U/L — ABNORMAL HIGH (ref 0–44)
AST: 168 U/L — ABNORMAL HIGH (ref 15–41)
Albumin: 2 g/dL — ABNORMAL LOW (ref 3.5–5.0)
Alkaline Phosphatase: 178 U/L — ABNORMAL HIGH (ref 38–126)
Bilirubin, Direct: 0.9 mg/dL — ABNORMAL HIGH (ref 0.0–0.2)
Indirect Bilirubin: 0.7 mg/dL (ref 0.3–0.9)
Total Bilirubin: 1.6 mg/dL — ABNORMAL HIGH (ref <1.2)
Total Protein: 4.8 g/dL — ABNORMAL LOW (ref 6.5–8.1)

## 2023-10-22 LAB — MAGNESIUM: Magnesium: 1.8 mg/dL (ref 1.7–2.4)

## 2023-10-22 MED ORDER — HALOPERIDOL LACTATE 5 MG/ML IJ SOLN
5.0000 mg | Freq: Once | INTRAMUSCULAR | Status: AC
  Administered 2023-10-22: 5 mg via INTRAVENOUS
  Filled 2023-10-22: qty 1

## 2023-10-22 MED ORDER — MAGNESIUM SULFATE 2 GM/50ML IV SOLN
2.0000 g | Freq: Once | INTRAVENOUS | Status: AC
  Administered 2023-10-22: 2 g via INTRAVENOUS
  Filled 2023-10-22: qty 50

## 2023-10-22 MED ORDER — POTASSIUM CHLORIDE 10 MEQ/100ML IV SOLN
10.0000 meq | INTRAVENOUS | Status: AC
  Administered 2023-10-22 (×6): 10 meq via INTRAVENOUS
  Filled 2023-10-22 (×6): qty 100

## 2023-10-22 NOTE — Consult Note (Addendum)
Attending physician's note   I have taken a history, reviewed the chart, and examined the patient. I performed a substantive portion of this encounter, including complete performance of at least one of the key components, in conjunction with the APP. I agree with the APP's note, impression, and recommendations with my edits.   Patient is a 89 year old female with medical history as outlined below, transferred from [facility] with concerns for cholangitis.  Patient significantly hard of hearing and with underlying dementia.  Son at bedside and helps provide much of her history.  Admission evaluation notable for leukocytosis (20), elevated liver enzymes, lactic acidosis.  Cultures with E. coli bacteremia.  MRI/MRCP with intrahepatic biliary duct dilation with fusiform dilation of the CBD with conical tapering with possible trace layering debris or tiny gallstones in the CBD.  BP stabilized overnight and has been weaned off vasopressor support.  Case discussed with advanced biliary service.  Thankfully she is hemodynamically stable and labs improved since admission.  Preferences for allowing Plavix washout and can hopefully proceed with just 1 ERCP in this 89 year old, rather than ERCP with stent placement without sphincterotomy with subsequent ERCP after Plavix washout with sphincterotomy at that time.  - Continue IV Zosyn - Holding Plavix - Daily CBC and CMP - If any change in hemodynamics or clinical status, plan for ERCP with stent placement - Inpatient GI service will continue to follow  [name] [phone] office         Consultation  Referring Provider: Judithe Modest, NP Primary Care Physician:  Dorcas Carrow, DO Primary Gastroenterologist: unassigned  Reason for Consultation: Acute cholangitis  HPI: Sarah Freeman is a 89 y.o. female who had presented to [facility] from her nursing facility on Thursday, 10/20/2023 with fever and  vomiting.  Per notes she apparently does have history of frequent UTIs.  She was found to be septic and admitted to the intensive care unit and did require pressors. Initial WBC 20,000 T. bili 1.1/alk phos 309/AST 431/ALT 123 Lactate 4.1-normalized by 10/21/2023 She was started on broad-spectrum antibiotics. CT of the abdomen and pelvis showed her to be postcholecystectomy and with intra and extrahepatic biliary ductal dilation.  Common bile duct 15 mm with distal tapering no definite visible bile duct stones.  Some pancreatic atrophy.  GI was consulted there and she underwent MRI/MRCP-trace pleural effusion, mild intrahepatic biliary ductal dilation with substantial fusiform dilation of the CBD and common bile duct CBD 1.8 cm with abrupt distal conical tapering-slight irregularity/thickening along the posterior wall of the common bile duct raising possibility of trace amount of layering debris or tiny stones, sigmoid diverticulosis.  No ERCP availability at [facility] this weekend so transferred here.  She has improved since admission, she was weaned off of pressors last evening and is stable today  WBC 9.0/hemoglobin 9.3/hematocrit 27.5 Potassium 3.0/BUN 3/creatinine 0.78 T. bili 1.6/alk phos 178/AST 168/ALT 97 improved  Blood cultures have returned positive for E. Coli  Patient is alert, hard of hearing, son at bedside denies any abdominal pain at this time no nausea.   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  Patient may have received Plavix early yesterday morning 10/21/2023.  Past Medical History:  Diagnosis Date   Hyperlipidemia    Hypothyroid    Stroke [facility])    Urinary incontinence     Past Surgical History:  Procedure Laterality Date  BREAST BIOPSY Left 2000?   neg. dr. Rutherford Nail office   CHOLECYSTECTOMY  1964   COLONOSCOPY     Dr Maryruth Bun   DILATION AND CURETTAGE OF UTERUS      EYE SURGERY     LOOP RECORDER INSERTION N/A 02/09/2018   Procedure: LOOP RECORDER INSERTION;  Surgeon: Duke Salvia, MD;  Location: [facility] INVASIVE CV LAB;  Service: Cardiovascular;  Laterality: N/A;   SKIN CANCER EXCISION     face   TEE WITHOUT CARDIOVERSION N/A 02/09/2018   Procedure: TRANSESOPHAGEAL ECHOCARDIOGRAM (TEE);  Surgeon: Antonieta Iba, MD;  Location: [facility] ORS;  Service: Cardiovascular;  Laterality: N/A;   TOTAL ABDOMINAL HYSTERECTOMY W/ BILATERAL SALPINGOOPHORECTOMY     age 61    Prior to Admission medications   Medication Sig Start Date End Date Taking? Authorizing Provider  alendronate (FOSAMAX) 70 MG/75ML solution Take 70 mg by mouth every 7 (seven) days. 08/22/23   [provider]  aspirin EC 81 MG tablet Take 81 mg by mouth daily. Swallow whole.    [provider]  cholecalciferol (VITAMIN D3) 25 MCG (1000 UNIT) tablet Take 1,000 Units by mouth daily.    [provider]  clopidogrel (PLAVIX) 75 MG tablet Take 75 mg by mouth daily. 10/11/23   [provider]  cyanocobalamin 1000 MCG tablet Take 1 tablet (1,000 mcg total) by mouth daily. 04/19/23   Amin, Ankit C, MD  cyclobenzaprine (FLEXERIL) 5 MG tablet Take 5 mg by mouth 3 (three) times daily as needed. 09/22/23   [provider]  donepezil (ARICEPT) 5 MG tablet Take 1 tablet (5 mg total) by mouth at bedtime. Patient taking differently: Take 5 mg by mouth daily. 01/04/23   Johnson, Megan P, DO  gabapentin (NEURONTIN) 100 MG capsule TAKE 2 CAPSULES BY MOUTH EVERY MORNING AND 1 CAPSULE AT 1 PM 07/05/23   Johnson, Megan P, DO  levothyroxine (SYNTHROID) 50 MCG tablet Take 1 tablet (50 mcg total) by mouth daily before breakfast. 01/10/23   Laural Benes, Megan P, DO  mirtazapine (REMERON) 7.5 MG tablet Take 7.5 mg by mouth at bedtime. 09/30/23   [provider]  nitrofurantoin, macrocrystal-monohydrate, (MACROBID) 100 MG capsule Take 1 capsule (100 mg total) by mouth at bedtime.  11/08/22   Alfredo Martinez, MD  pantoprazole (PROTONIX) 40 MG tablet Take 1 tablet (40 mg total) by mouth daily. 05/17/23   Johnson, Megan P, DO  polyethylene glycol (MIRALAX / GLYCOLAX) 17 g packet Take 17 g by mouth daily.    [provider]  PROLIA 60 MG/ML SOSY injection Inject 60 mg into the skin every 6 (six) months. 05/22/20   [provider]  QUEtiapine (SEROQUEL) 25 MG tablet Take 0.5-1 tablets (12.5-25 mg total) by mouth every evening. Patient taking differently: Take 25 mg by mouth every evening. 05/17/23   Johnson, Megan P, DO  simvastatin (ZOCOR) 20 MG tablet Take 1 tablet (20 mg total) by mouth daily at 6 PM. Patient taking differently: Take 20 mg by mouth daily. 05/17/23   Dorcas Carrow, DO    Current Facility-Administered Medications  Medication Dose Route Frequency Provider Last Rate Last Admin   Chlorhexidine Gluconate Cloth 2 % PADS 6 each  6 each Topical Q2000 Steffanie Dunn, DO   6 each at 10/21/23 2145   cholecalciferol (VITAMIN D3) 25 MCG (1000 UNIT) tablet 1,000 Units  1,000 Units Oral Daily Desai, Rahul P, PA-C   1,000 Units at 10/22/23 1011   cyanocobalamin (VITAMIN B12) tablet 1,000  mcg  1,000 mcg Oral Daily Celine Mans, Rahul P, PA-C   1,000 mcg at 10/22/23 1019   docusate sodium (COLACE) capsule 100 mg  100 mg Oral BID PRN Celine Mans, Rahul P, PA-C       donepezil (ARICEPT) tablet 5 mg  5 mg Oral QHS Desai, Rahul P, PA-C       gabapentin (NEURONTIN) capsule 200 mg  200 mg Oral q morning Celine Mans, Rahul P, PA-C   200 mg at 10/22/23 1012   heparin injection 5,000 Units  5,000 Units Subcutaneous Q8H Desai, Rahul P, PA-C   5,000 Units at 10/22/23 4098   lactated ringers infusion   Intravenous Continuous Lynnell Catalan, MD 100 mL/hr at 10/22/23 0630 New Bag at 10/22/23 0630   levothyroxine (SYNTHROID) tablet 50 mcg  50 mcg Oral Q0600 Desai, Rahul P, PA-C       lip balm (CARMEX) ointment   Topical PRN Karie Fetch P, DO       midodrine (PROAMATINE) tablet 5 mg  5 mg  Oral TID WC Desai, Rahul P, PA-C       mirtazapine (REMERON) tablet 7.5 mg  7.5 mg Oral QHS Desai, Rahul P, PA-C       Oral care mouth rinse  15 mL Mouth Rinse 4 times per day Karie Fetch P, DO   15 mL at 10/22/23 0930   Oral care mouth rinse  15 mL Mouth Rinse PRN Karie Fetch P, DO       pantoprazole (PROTONIX) EC tablet 40 mg  40 mg Oral Daily Desai, Rahul P, PA-C   40 mg at 10/22/23 1011   piperacillin-tazobactam (ZOSYN) IVPB 3.375 g  3.375 g Intravenous Q8H Silvana Newness, RPH 12.5 mL/hr at 10/22/23 0630 3.375 g at 10/22/23 0630   polyethylene glycol (MIRALAX / GLYCOLAX) packet 17 g  17 g Oral Daily PRN Desai, Rahul P, PA-C       polyethylene glycol (MIRALAX / GLYCOLAX) packet 17 g  17 g Oral Daily Desai, Rahul P, PA-C   17 g at 10/22/23 1011   QUEtiapine (SEROQUEL) tablet 25 mg  25 mg Oral QPM Desai, Rahul P, PA-C        Allergies as of 10/21/2023 - Reviewed 10/21/2023  Allergen Reaction Noted   Statins Other (See Comments) 02/22/2017   Bacitracin Other (See Comments) 04/29/2020   Neomycin-polymyxin-gramicidin Other (See Comments) 09/01/2016    Family History  Problem Relation Age of Onset   Cancer Father        prostate   Stroke Father    Hypertension Sister    Hyperlipidemia Daughter    Heart disease Son    Diabetes Maternal Grandmother    Heart disease Maternal Grandmother    Heart disease Maternal Grandfather    Heart disease Daughter    CAD Mother    Failure to thrive Mother    Breast cancer Neg Hx     Social History   Socioeconomic History   Marital status: Widowed    Spouse name: Not on file   Number of children: Not on file   Years of education: Not on file   Highest education level: Not on file  Occupational History   Occupation: retired  Tobacco Use   Smoking status: Never    Passive exposure: Never   Smokeless tobacco: Never  Vaping Use   Vaping status: Never Used  Substance and Sexual Activity   Alcohol use: No    Alcohol/week: 0.0 standard  drinks of alcohol  Drug use: No   Sexual activity: Not Currently  Other Topics Concern   Not on file  Social History Narrative   Not on file   Social Determinants of Health   Financial Resource Strain: Low Risk  (02/01/2023)   Overall Financial Resource Strain (CARDIA)    Difficulty of Paying Living Expenses: Not hard at all  Food Insecurity: No Food Insecurity (10/21/2023)   Hunger Vital Sign    Worried About Running Out of Food in the Last Year: Never true    Ran Out of Food in the Last Year: Never true  Transportation Needs: No Transportation Needs (10/21/2023)   PRAPARE - Administrator, Civil Service (Medical): No    Lack of Transportation (Non-Medical): No  Physical Activity: Insufficiently Active (02/01/2023)   Exercise Vital Sign    Days of Exercise per Week: 4 days    Minutes of Exercise per Session: 10 min  Stress: No Stress Concern Present (02/01/2023)   [employer] of Occupational Health - Occupational Stress Questionnaire    Feeling of Stress : Not at all  Social Connections: Moderately Isolated (02/01/2023)   Social Connection and Isolation Panel [NHANES]    Frequency of Communication with Friends and Family: More than three times a week    Frequency of Social Gatherings with Friends and Family: More than three times a week    Attends Religious Services: More than 4 times per year    Active Member of [employer] or Organizations: No    Attends Banker Meetings: Never    Marital Status: Widowed  Intimate Partner Violence: Patient Unable To Answer (10/21/2023)   Humiliation, Afraid, Rape, and Kick questionnaire    Fear of Current or Ex-Partner: Patient unable to answer    Emotionally Abused: Patient unable to answer    Physically Abused: Patient unable to answer    Sexually Abused: Patient unable to answer    Review of Systems: Pertinent positive and negative review of systems were noted in the above HPI section.  All other review of systems  was otherwise negative.   Physical Exam: Vital signs in last 24 hours: Temp:  [97.3 F (36.3 C)-98.9 F (37.2 C)] 98.3 F (36.8 C) (11/23 1120) Pulse Rate:  [58-83] 79 (11/23 1130) Resp:  [7-55] 18 (11/23 1130) BP: (80-143)/(38-105) 131/50 (11/23 1130) SpO2:  [90 %-100 %] 99 % (11/23 1130) Weight:  [70.6 kg] 70.6 kg (11/23 0350) Last BM Date : 10/21/23 General:   Alert,  Well-developed, very elderly white female pleasant and cooperative in NAD.  Hard of hearing, son at bedside Head:  Normocephalic and atraumatic. Eyes:  Sclera clear, no icterus.   Conjunctiva pink. Ears:  Normal auditory acuity. Nose:  No deformity, discharge,  or lesions. Mouth:  No deformity or lesions.   Neck:  Supple; no masses or thyromegaly. Lungs:  Clear throughout to auscultation.   No wheezes, crackles, or rhonchi.  Heart:  Regular rate and rhythm; no murmurs, clicks, rubs,  or gallops. Abdomen:  Soft,nontender, BS active,nonpalp mass or hsm.   Rectal:  Deferred  Msk:  Symmetrical without gross deformities. . Pulses:  Normal pulses noted. Extremities:  Without clubbing or edema. Neurologic:  Alert and  oriented x2;  grossly normal neurologically. Skin:  Intact without significant lesions or rashes.. Psych:  Alert and cooperative. Normal mood and affect.  Intake/Output from previous day: 11/22 0701 - 11/23 0700 In: 191.7 [I.V.:12.7; IV Piggyback:179] Out: 675 [Urine:675] Intake/Output this shift: Total I/O In: -  Out: 1000 [Urine:1000]  Lab Results: Recent Labs    10/20/23 2044 10/21/23 0504 10/22/23 0258  WBC 20.0* 17.7* 9.0  HGB 12.0 10.0* 9.3*  HCT 37.0 29.8* 27.5*  PLT 475* 383 318   BMET Recent Labs    10/20/23 2044 10/21/23 0504 10/22/23 0258  NA 134* 136 138  K 3.8 3.5 3.0*  CL 98 103 108  CO2 26 24 24   GLUCOSE 156* 169* 73  BUN 10 10 5*  CREATININE 0.81 0.76 0.78  CALCIUM 8.2* 8.1* 7.8*   LFT Recent Labs    10/22/23 0258  PROT 4.8*  ALBUMIN 2.0*  AST 168*  ALT  97*  ALKPHOS 178*  BILITOT 1.6*  BILIDIR 0.9*  IBILI 0.7   PT/INR Recent Labs    10/20/23 2044 10/21/23 0504  LABPROT 14.4 16.1*  INR 1.1 1.3*   Hepatitis Panel Recent Labs    10/21/23 0504  HEPBSAG NON REACTIVE  HCVAB NON REACTIVE  HEPAIGM NON REACTIVE  HEPBIGM NON REACTIVE    IMPRESSION:  #17 89 year old white female, transferred from [facility] last evening after admission there late 10/20/2023 with fever and vomiting Workup revealed sepsis, and CT then MRI/MRCP as outlined above concerning for acute cholangitis secondary to distal common bile duct obstruction likely secondary to tiny stones/sludge.  Patient is status post cholecystectomy.  She initially required broad-spectrum antibiotic coverage, and pressors for hypotension.  Blood cultures positive for E. Coli  She has improved since admission, transferred here last evening for ERCP as no availability at [facility] However  patient had been on Plavix prior to admission and likely received a dose early 10/21/2023.  She is now off pressors, seems to be mentating well, afebrile, and LFTs improved  #2 dementia #3.  Status post CVA's May 2024 in July 2024 with left hemiplegia, on dual antiplatelet therapy #4 history of hypertension #5.  Chronic kidney disease stage III  PLAN: Patient will need ERCP, however if done in the next 48 hours that would require stent placement only and no sphincterotomy given no Plavix washout. Patient clinically has improved, is now off pressors and lab parameters all improved as well, will hold off on ERCP at this time in hopes that she can have 1 procedure with ERCP, sphincterotomy and stone extraction rather than 2 separate procedures/general anesthesia-initially for stent placement and then repeat procedure for stone extraction.  Full liquid diet today IV Zosyn Hold Plavix-D5 would not be until 10/26/2023 CBC and c-Met in a.m. Follow closely, if she has any decline in status, can  schedule for ERCP with just stent placement for tomorrow with Dr. Russella Dar. Plans were discussed with her son at bedside, and he is in agreement.   Amy Esterwood PA-C  10/22/2023, 11:37 AM

## 2023-10-22 NOTE — Progress Notes (Signed)
[facility] ADULT ICU REPLACEMENT PROTOCOL   The patient does apply for the [facility] Adult ICU Electrolyte Replacment Protocol based on the criteria listed below:   1.Exclusion criteria: TCTS, ECMO, Dialysis, and Myasthenia Gravis patients 2. Is GFR >/= 30 ml/min? Yes.    Patient's GFR today is >60 3. Is SCr </= 2? Yes.   Patient's SCr is 0.78 mg/dL 4. Did SCr increase >/= 0.5 in 24 hours? No. 5.Pt's weight >40kg  Yes.   6. Abnormal electrolyte(s): potassium 3.0, mag 1.8  7. Electrolytes replaced per protocol 8.  Call MD STAT for K+ </= 2.5, Phos </= 1, or Mag </= 1 Physician:  protocol  Melvern Banker 10/22/2023 4:08 AM

## 2023-10-22 NOTE — Progress Notes (Signed)
NAME:  Sarah Freeman, MRN:  [id number], DOB:  02/04/1931, LOS: 0 ADMISSION DATE:  (Not on file), CONSULTATION DATE:  10/21/23 REFERRING MD:  [facility] CHIEF COMPLAINT:  Fever, Emesis   Overview: Sarah Freeman is a 89 y.o. female who has a PMH as below and who resides at [facility]. She was taken to [facility] 11/21 for fever and vomiting. She had apparently otherwise been in her usual state of health without any additional symptoms.    She was found to have sepsis that progressed to shock physiology requiring low dose vasopressor support. Workup suggested UTI. She was started on Vanc/Zosyn and cultures were obtained. BCx's positive for E.coli and enterobacter.   She had CT A/P that revealed moderate stool burden throughout with distention of th rectum, intra and extrahepatic biliary ductal dilatation with CBD measuring 15mm, no visible choledocholithiasis. She reportedly had fecal disimpaction in the ED (I do not see note of this).   On 11/22, she was seen by GI and had MRCP performed which revealed intrahepatic, common hepatic, and CBD dilatation - ? ascending cholangitis.  GI recommended transfer to [facility] for further GI evaluation and for consideration of ERCP.   Given her shock state and low dose NE requirements, PCCM was consulted for transfer to [facility].   Of note, she has a DNR in place and this was confirmed on admission. 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   Objective: afebrile, BP 110s/60s, 99 RA  Vital signs in last 24 hours: Vitals:   10/22/23 0815 10/22/23 0830 10/22/23 0900 10/22/23 0930  BP: (!) 133/50 (!) 133/58 (!)  138/50 (!) 128/52  Pulse: 69 68 69 60  Resp: 15 16 14 15   Temp:      TempSrc:      SpO2: 98% 98% 100% 97%  Weight:       Supplemental O2: Room Air Last BM Date : 10/21/23 SpO2: 97 % Filed Weights   10/22/23 0350  Weight: 70.6 kg    Intake/Output Summary (Last 24 hours) at 10/22/2023 0959 Last data filed at 10/22/2023 4098 Gross per 24 hour  Intake 191.73 ml  Output 1450 ml  Net -1258.27 ml   Net IO Since Admission: -1,258.27 mL [10/22/23 0959]  Physical Exam General: NAD HENT: NCAT Lungs: CTAB Cardiovascular: NSR, good pulses in all extremties  Abdomen: No TTP with light and deep palpation, normal bowel sounds MSK: No asymmetry, chronic weakness with no strength in LUE and 3/5 in LLE. Skin: no lesions Neuro: alert, orientation at baseline with oriented to self but situation but not time or place Psych: pleasant mood  Diagnostics    Latest Ref Rng & Units 10/22/2023    2:58 AM 10/21/2023    5:04 AM 10/20/2023    8:44 PM  CBC  WBC 4.0 - 10.5 K/uL 9.0  17.7  20.0   Hemoglobin 12.0 - 15.0 g/dL 9.3  11.9  14.7   Hematocrit 36.0 - 46.0 % 27.5  29.8  37.0   Platelets 150 - 400 K/uL 318  383  475        Latest Ref Rng & Units 10/22/2023    2:58 AM 10/21/2023  5:04 AM 10/20/2023    8:44 PM  CMP  Glucose 70 - 99 mg/dL 73  401  027   BUN 8 - 23 mg/dL 5  10  10    Creatinine 0.44 - 1.00 mg/dL 2.53  6.64  4.03   Sodium 135 - 145 mmol/L 138  136  134   Potassium 3.5 - 5.1 mmol/L 3.0  3.5  3.8   Chloride 98 - 111 mmol/L 108  103  98   CO2 22 - 32 mmol/L 24  24  26    Calcium 8.9 - 10.3 mg/dL 7.8  8.1  8.2   Total Protein 6.5 - 8.1 g/dL 4.8  5.7  6.8   Total Bilirubin <1.2 mg/dL 1.6  1.3  1.1   Alkaline Phos 38 - 126 U/L 178  284  309   AST 15 - 41 U/L 168  357  431   ALT 0 - 44 U/L 97  139  123      Assessment/Plan: Principal Problem:   Septic shock (HCC) Active Problems:   Hypothyroidism   Urinary tract infection   Elevated LFTs  Septic Shock secondary  to ascending cholangitis vs Urosepsis Recurrent UTIs Elevated LFTs Shock resolved this am. Blood culture growing gram negative rods. BCID with E. coli. On IV Zosyn 3.375 g q8hrs. Leukocytosis improving. UA with large leukocytes and nitrites with >50 WBC. Urine culture pending. Off levophed. GI consulted and will see the patient in this am. Pt with recurrent UTIs and on nitrofurantoin 100 mg daily ppx. Started on midodrine 5 mg TID and we will continue this but discontinue at discharge. GI consulted and planning for ERCP likely on Monday after plavix washout. Will await their final recommendations. Appreciate their assistance.  If pt continues to do well, she will be safe to move to progressive level of care given her interval improvement.   Normocytic Anemia: Likely dilutional. Will continue to monitor or signs of bleeding and CTM daily hgb.   Chronic Problems HTN: Will CTM. Not on chronic antihypertensives.  HLD/CVA: Holding statin given elevated LFTs. Holding plavix this am.  Hypothyroidism: Continue home synthroid Dementia: Continue Seroquel, Remeron, and donepezil. Debility: Will get PT/OT.   Diet: NPO IVF: LR,100cc/hr VTE: Heparin held Code: DNR/DNI PT/OT recs: Pending Prior to Admission Living Arrangement: Home Anticipated Discharge Location: Home Barriers to Discharge: Medical Stability Dispo: Anticipated discharge in approximately TBD day(s).   Gwenevere Abbot, MD Eligha Bridegroom. [facility], PGY-3  Pager: [phone] After 5 pm and on weekends: Please call the on-call pager

## 2023-10-22 NOTE — Progress Notes (Signed)
eLink Physician-Brief Progress Note Patient Name: Sarah Freeman DOB: [birth year] MRN: [id number]   Date of Service  10/22/2023  HPI/Events of Note  Restless agitation. Patient with advanced age and dementia.  eICU Interventions  Check QT If QT ok will try dose of haldol IV x 1     Intervention Category Major Interventions: Delirium, psychosis, severe agitation - evaluation and management  Henry Russel, P 10/22/2023, 9:21 PM

## 2023-10-23 DIAGNOSIS — R7881 Bacteremia: Secondary | ICD-10-CM | POA: Diagnosis not present

## 2023-10-23 DIAGNOSIS — B9620 Unspecified Escherichia coli [E. coli] as the cause of diseases classified elsewhere: Secondary | ICD-10-CM

## 2023-10-23 DIAGNOSIS — N3000 Acute cystitis without hematuria: Secondary | ICD-10-CM | POA: Diagnosis not present

## 2023-10-23 DIAGNOSIS — R6521 Severe sepsis with septic shock: Secondary | ICD-10-CM | POA: Diagnosis not present

## 2023-10-23 DIAGNOSIS — R7989 Other specified abnormal findings of blood chemistry: Secondary | ICD-10-CM | POA: Diagnosis not present

## 2023-10-23 DIAGNOSIS — K8021 Calculus of gallbladder without cholecystitis with obstruction: Secondary | ICD-10-CM | POA: Diagnosis not present

## 2023-10-23 LAB — CULTURE, BLOOD (ROUTINE X 2)
Culture  Setup Time: NONE SEEN
Special Requests: ADEQUATE

## 2023-10-23 LAB — COMPREHENSIVE METABOLIC PANEL
ALT: 91 U/L — ABNORMAL HIGH (ref 0–44)
AST: 106 U/L — ABNORMAL HIGH (ref 15–41)
Albumin: 2.3 g/dL — ABNORMAL LOW (ref 3.5–5.0)
Alkaline Phosphatase: 206 U/L — ABNORMAL HIGH (ref 38–126)
Anion gap: 10 (ref 5–15)
BUN: <5 mg/dL — ABNORMAL LOW (ref 8–23)
CO2: 21 mmol/L — ABNORMAL LOW (ref 22–32)
Calcium: 8.6 mg/dL — ABNORMAL LOW (ref 8.9–10.3)
Chloride: 108 mmol/L (ref 98–111)
Creatinine, Ser: 0.75 mg/dL (ref 0.44–1.00)
GFR, Estimated: >60 mL/min (ref >60)
Glucose, Bld: 90 mg/dL (ref 70–99)
Potassium: 4.2 mmol/L (ref 3.5–5.1)
Sodium: 139 mmol/L (ref 135–145)
Total Bilirubin: 1 mg/dL (ref <1.2)
Total Protein: 6.1 g/dL — ABNORMAL LOW (ref 6.5–8.1)

## 2023-10-23 LAB — CBC WITH DIFFERENTIAL/PLATELET
Abs Immature Granulocytes: 0.04 10*3/uL (ref 0.00–0.07)
Basophils Absolute: 0.1 10*3/uL (ref 0.0–0.1)
Basophils Relative: 1 %
Eosinophils Absolute: 0.7 10*3/uL — ABNORMAL HIGH (ref 0.0–0.5)
Eosinophils Relative: 7 %
HCT: 35.4 % — ABNORMAL LOW (ref 36.0–46.0)
Hemoglobin: 11.5 g/dL — ABNORMAL LOW (ref 12.0–15.0)
Immature Granulocytes: 0 %
Lymphocytes Relative: 41 %
Lymphs Abs: 3.7 10*3/uL (ref 0.7–4.0)
MCH: 30.9 pg (ref 26.0–34.0)
MCHC: 32.5 g/dL (ref 30.0–36.0)
MCV: 95.2 fL (ref 80.0–100.0)
Monocytes Absolute: 0.7 10*3/uL (ref 0.1–1.0)
Monocytes Relative: 8 %
Neutro Abs: 3.9 10*3/uL (ref 1.7–7.7)
Neutrophils Relative %: 43 %
Platelets: 364 10*3/uL (ref 150–400)
RBC: 3.72 MIL/uL — ABNORMAL LOW (ref 3.87–5.11)
RDW: 13.5 % (ref 11.5–15.5)
WBC: 9.1 10*3/uL (ref 4.0–10.5)
nRBC: 0 % (ref 0.0–0.2)

## 2023-10-23 LAB — URINE CULTURE: Culture: >=100000 — AB

## 2023-10-23 LAB — GLUCOSE, CAPILLARY
Glucose-Capillary: 111 mg/dL — ABNORMAL HIGH (ref 70–99)
Glucose-Capillary: 65 mg/dL — ABNORMAL LOW (ref 70–99)
Glucose-Capillary: 74 mg/dL (ref 70–99)
Glucose-Capillary: 83 mg/dL (ref 70–99)
Glucose-Capillary: 89 mg/dL (ref 70–99)
Glucose-Capillary: 91 mg/dL (ref 70–99)

## 2023-10-23 MED ORDER — MIDODRINE HCL 5 MG PO TABS
2.5000 mg | ORAL_TABLET | Freq: Two times a day (BID) | ORAL | Status: DC
  Administered 2023-10-23 – 2023-10-27 (×8): 2.5 mg via ORAL
  Filled 2023-10-23 (×8): qty 1

## 2023-10-23 MED ORDER — ACETAMINOPHEN 325 MG PO TABS
325.0000 mg | ORAL_TABLET | Freq: Four times a day (QID) | ORAL | Status: DC | PRN
  Administered 2023-10-23 – 2023-10-24 (×3): 325 mg via ORAL
  Filled 2023-10-23 (×3): qty 1

## 2023-10-23 MED ORDER — HEPARIN SODIUM (PORCINE) 5000 UNIT/ML IJ SOLN
5000.0000 [IU] | Freq: Three times a day (TID) | INTRAMUSCULAR | Status: AC
  Administered 2023-10-23 (×2): 5000 [IU] via SUBCUTANEOUS
  Filled 2023-10-23 (×2): qty 1

## 2023-10-23 NOTE — Progress Notes (Signed)
Hypoglycemic Event  CBG: 65  Treatment: 8 oz juice/soda  Symptoms: None  Follow-up CBG: Time:0355 CBG Result: 74  Possible Reasons for Event: Inadequate meal intake  Comments/MD notified:hypoglycemia protocol followed    Sarah Freeman

## 2023-10-23 NOTE — Progress Notes (Addendum)
Patient ID: Sarah Freeman, female   DOB: 10/05/1931, 89 y.o.   MRN: [id number]     Attending physician's note   I have taken a history, reviewed the chart, and examined the patient. I performed a substantive portion of this encounter, including complete performance of at least one of the key components, in conjunction with the APP. I agree with the APP's note, impression, and recommendations with my edits.  Liver enzymes continue to slowly improve, leukocytosis has resolved.  Hemodynamically stable and no longer on pressors >24 hours.  Discussed plan with family member at bedside.  - Continue IV Zosyn - Continue trending liver enzymes and daily CBC - Plan for ERCP on this admission.  Will make n.p.o. at midnight for possible ERCP tomorrow depending on endoscopy space/availability.  If not able, plan would be for ERCP on Tuesday (would be full 5-day Plavix washout at that point) - Inpatient GI service will continue to follow.  Dr. Leonides Schanz will assume her ongoing inpatient GI care starting tomorrow morning  Doristine Locks, DO, FACG [phone] office          Progress Note   Subjective   Day # 2 CC;acute cholangitis  IV Zosyn Plavix on hold-day #3  Labs today-WBC 9.1/hemoglobin 11.5/hematocrit 5.4 Potassium 4.2/BUN less than 5/creatinine 0.75 T. bili 1.0/alk phos 206/AST 106/ALT 91-improved  Daughter-in-law at bedside, patient did have some agitation during the night and received IV Ativan, more sleepy than usual this morning She does wake easily, and is appropriate, no complaints of abdominal pain no nausea, says she does not want to eat yet, too early Pressure elevated today  Afebrile overnight    Objective   Vital signs in last 24 hours: Temp:  [97.8 F (36.6 C)-98.5 F (36.9 C)] 97.9 F (36.6 C) (11/24 0808) Pulse Rate:  [60-118] 90 (11/24 0630) Resp:  [9-29] 16 (11/24 0630) BP: (100-167)/(30-110) 157/80 (11/24 0630) SpO2:  [95 %-100 %] 99 % (11/24  0630) Weight:  [70.9 kg] 70.9 kg (11/24 0500) Last BM Date : 10/22/23 General: Elderly   white female in NAD, resting comfortably-HOH Heart:  Re gular rate and rhythm; no murmurs Lungs: Respirations even and unlabored, lungs CTA bilaterally Abdomen:  Soft, nontender and nondistended. Normal bowel sounds. Extremities:  Without edema. Neurologic:  Alert and oriented,  grossly normal neurologically. Psych:  Cooperative. Normal mood and affect.  Intake/Output from previous day: 11/23 0701 - 11/24 0700 In: 149.7 [IV Piggyback:149.7] Out: 3575 [Urine:3575] Intake/Output this shift: No intake/output data recorded.  Lab Results: Recent Labs    10/21/23 0504 10/22/23 0258 10/23/23 0305  WBC 17.7* 9.0 9.1  HGB 10.0* 9.3* 11.5*  HCT 29.8* 27.5* 35.4*  PLT 383 318 364   BMET Recent Labs    10/21/23 0504 10/22/23 0258 10/23/23 0305  NA 136 138 139  K 3.5 3.0* 4.2  CL 103 108 108  CO2 24 24 21*  GLUCOSE 169* 73 90  BUN 10 5* <5*  CREATININE 0.76 0.78 0.75  CALCIUM 8.1* 7.8* 8.6*   LFT Recent Labs    10/22/23 0258 10/23/23 0305  PROT 4.8* 6.1*  ALBUMIN 2.0* 2.3*  AST 168* 106*  ALT 97* 91*  ALKPHOS 178* 206*  BILITOT 1.6* 1.0  BILIDIR 0.9*  --   IBILI 0.7  --    PT/INR Recent Labs    10/20/23 2044 10/21/23 0504  LABPROT 14.4 16.1*  INR 1.1 1.3*    Studies/Results: MR ABDOMEN MRCP W WO CONTAST  Result Date:  10/21/2023 CLINICAL DATA:  Abdominal pain with biliary dilatation EXAM: MRI ABDOMEN WITHOUT AND WITH CONTRAST (INCLUDING MRCP) TECHNIQUE: Multiplanar multisequence MR imaging of the abdomen was performed both before and after the administration of intravenous contrast. Heavily T2-weighted images of the biliary and pancreatic ducts were obtained, and three-dimensional MRCP images were rendered by post processing. Post-processing was applied at the acquisition scanner with concurrent physician supervision which includes 3D reconstructions, MIPs, volume rendered  images and/or shaded surface rendering. CONTRAST:  6mL GADAVIST GADOBUTROL 1 MMOL/ML IV SOLN COMPARISON:  10/20/2023 FINDINGS: Despite efforts by the technologist and patient, prominent motion artifact is present on today's exam and could not be eliminated. This reduces exam sensitivity and specificity. This is a common result when MRCP is attempted in the inpatient setting where patients are less likely to be able to breath hold and cooperate in controlling motion. Lower chest: Trace right pleural effusion. Suspected mild dependent airspace opacity in the right lower lobe based on coronal images. Mild cardiomegaly. Trace left pleural effusion. Hepatobiliary: Mild intrahepatic biliary dilatation with substantial fusiform dilation of the common hepatic duct and common bile duct, CBD caliber about 1.8 cm on image 8 series 10. Slightly abrupt distal conical tapering of the CBD is shown on image 9 series 10. No significant abnormal enhancement of the wall of the extrahepatic biliary tree. Subtle irregularity/thickening along the posterior wall of the common bile duct on image 20 through 21 of series 4 raise the possibility of a trace amount of layering debris or conceivably tiny gallstones although these are not well seen individually or in a definitive manner. There is no abrupt truncation of the distal CBD at the ampulla. No discrete ampullary mass. No discrete hepatic parenchymal lesion. Pancreas:  Unremarkable Spleen:  Unremarkable Adrenals/Urinary Tract: 6 mm Bosniak category 1 cyst of the right kidney upper pole. No further imaging workup of this lesion is indicated. Otherwise unremarkable. Stomach/Bowel: Sigmoid colon diverticulosis. Scattered diverticula the descending colon. Vascular/Lymphatic: Abdominal aortic atherosclerosis. As better shown on prior CT, atheromatous plaque narrows the origin of the celiac artery and likewise causes substantial narrowing proximally in the superior mesenteric artery, without  overt occlusion. Other: Trace ascites along the inferior right hepatic lobe margin. Some of this also extends along the porta hepatis. Musculoskeletal: Sarcopenia. Substantial lumbar spondylosis and degenerative disc disease. IMPRESSION: 1. Mild intrahepatic biliary dilatation with substantial fusiform dilation of the common hepatic duct and common bile duct, CBD caliber about 1.8 cm. Slightly abrupt distal conical tapering of the CBD is shown. No discrete ampullary mass is identified. Subtle irregularity/thickening along the posterior wall of the common bile duct raise the possibility of a trace amount of layering debris or conceivably tiny gallstones although these are not well seen individually or in a definitive manner. The extrahepatic biliary dilatation could be a physiologic response to prior cholecystectomy, sphincter of Oddi dysfunction/stricture, or type 1a choledochal cyst. 2. Trace ascites along the inferior right hepatic lobe margin and porta hepatis. 3. Trace right pleural effusion with suspected mild dependent airspace opacity in the right lower lobe, atelectasis versus pneumonia. 4. Mild cardiomegaly. 5. Sigmoid colon diverticulosis. 6. Aortic atherosclerosis. As better shown on prior CT, atheromatous plaque narrows the origin of the celiac artery and likewise causes substantial narrowing proximally in the superior mesenteric artery, without overt occlusion. 7. Sarcopenia. 8. Substantial lumbar spondylosis and degenerative disc disease. 9. Despite efforts by the technologist and patient, motion artifact is present on today's exam and could not be eliminated. This reduces exam  sensitivity and specificity. Electronically Signed   By: Gaylyn Rong M.D.   On: 10/21/2023 16:04   MR 3D Recon At Scanner  Result Date: 10/21/2023 CLINICAL DATA:  Abdominal pain with biliary dilatation EXAM: MRI ABDOMEN WITHOUT AND WITH CONTRAST (INCLUDING MRCP) TECHNIQUE: Multiplanar multisequence MR imaging of the  abdomen was performed both before and after the administration of intravenous contrast. Heavily T2-weighted images of the biliary and pancreatic ducts were obtained, and three-dimensional MRCP images were rendered by post processing. Post-processing was applied at the acquisition scanner with concurrent physician supervision which includes 3D reconstructions, MIPs, volume rendered images and/or shaded surface rendering. CONTRAST:  6mL GADAVIST GADOBUTROL 1 MMOL/ML IV SOLN COMPARISON:  10/20/2023 FINDINGS: Despite efforts by the technologist and patient, prominent motion artifact is present on today's exam and could not be eliminated. This reduces exam sensitivity and specificity. This is a common result when MRCP is attempted in the inpatient setting where patients are less likely to be able to breath hold and cooperate in controlling motion. Lower chest: Trace right pleural effusion. Suspected mild dependent airspace opacity in the right lower lobe based on coronal images. Mild cardiomegaly. Trace left pleural effusion. Hepatobiliary: Mild intrahepatic biliary dilatation with substantial fusiform dilation of the common hepatic duct and common bile duct, CBD caliber about 1.8 cm on image 8 series 10. Slightly abrupt distal conical tapering of the CBD is shown on image 9 series 10. No significant abnormal enhancement of the wall of the extrahepatic biliary tree. Subtle irregularity/thickening along the posterior wall of the common bile duct on image 20 through 21 of series 4 raise the possibility of a trace amount of layering debris or conceivably tiny gallstones although these are not well seen individually or in a definitive manner. There is no abrupt truncation of the distal CBD at the ampulla. No discrete ampullary mass. No discrete hepatic parenchymal lesion. Pancreas:  Unremarkable Spleen:  Unremarkable Adrenals/Urinary Tract: 6 mm Bosniak category 1 cyst of the right kidney upper pole. No further imaging  workup of this lesion is indicated. Otherwise unremarkable. Stomach/Bowel: Sigmoid colon diverticulosis. Scattered diverticula the descending colon. Vascular/Lymphatic: Abdominal aortic atherosclerosis. As better shown on prior CT, atheromatous plaque narrows the origin of the celiac artery and likewise causes substantial narrowing proximally in the superior mesenteric artery, without overt occlusion. Other: Trace ascites along the inferior right hepatic lobe margin. Some of this also extends along the porta hepatis. Musculoskeletal: Sarcopenia. Substantial lumbar spondylosis and degenerative disc disease. IMPRESSION: 1. Mild intrahepatic biliary dilatation with substantial fusiform dilation of the common hepatic duct and common bile duct, CBD caliber about 1.8 cm. Slightly abrupt distal conical tapering of the CBD is shown. No discrete ampullary mass is identified. Subtle irregularity/thickening along the posterior wall of the common bile duct raise the possibility of a trace amount of layering debris or conceivably tiny gallstones although these are not well seen individually or in a definitive manner. The extrahepatic biliary dilatation could be a physiologic response to prior cholecystectomy, sphincter of Oddi dysfunction/stricture, or type 1a choledochal cyst. 2. Trace ascites along the inferior right hepatic lobe margin and porta hepatis. 3. Trace right pleural effusion with suspected mild dependent airspace opacity in the right lower lobe, atelectasis versus pneumonia. 4. Mild cardiomegaly. 5. Sigmoid colon diverticulosis. 6. Aortic atherosclerosis. As better shown on prior CT, atheromatous plaque narrows the origin of the celiac artery and likewise causes substantial narrowing proximally in the superior mesenteric artery, without overt occlusion. 7. Sarcopenia. 8. Substantial lumbar spondylosis  and degenerative disc disease. 9. Despite efforts by the technologist and patient, motion artifact is present on  today's exam and could not be eliminated. This reduces exam sensitivity and specificity. Electronically Signed   By: Gaylyn Rong M.D.   On: 10/21/2023 16:04       Assessment / Plan:    #84 89 year old white female [place] from [facility] Friday evening after admission there late on 10/20/2023 with fever and vomiting  Consistent with sepsis, and CT then MRI/MRCP concerning for acute cholangitis secondary to distal common bile duct obstruction likely secondary to tiny stones/sludge Patient's status post cholecystectomy  She initially was hypotensive and required pressors, pressors off since yesterday morning Patient has been afebrile  Culture positive for E. Coli Remains on IV Zosyn  LFTs improved  #2 dementia #3.  Chronic kidney disease stage III #4.  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 after midnight Continue  IV Zosyn Repeat labs in a.m. tomorrow will be day 4 off Plavix Needs ERCP, will defer to Dr. Leone Payor who is covering for biliary this week regarding proceeding with ERCP/stone extraction Monday afternoon versus Tuesday. Plans were discussed with the patient's daughter-in-law at bedside and questions answered.    Principal Problem:   Septic shock (HCC) Active Problems:   Hypothyroidism   Urinary tract infection   Elevated LFTs   E coli bacteremia   Calculus of gallbladder with biliary obstruction but without cholecystitis     LOS: 2 days   Amy Esterwood PA-C 10/23/2023, 8:31 AM

## 2023-10-23 NOTE — Progress Notes (Signed)
[facility]                                                                              Sarah Freeman, is a 89 y.o. female, DOB - 10-01-1931, ZOX:[id number] Admit date - 10/21/2023    Outpatient Primary MD for the patient is Olevia Perches P, DO  LOS - 2  days  No chief complaint on file.      Brief summary   Patient is a 89 year old female with HTN, hyperlipidemia, history of CVA, dementia, osteoporosis, resides at [facility], was originally admitted to [facility] on 11/21 for fevers and vomiting.  She was found to have sepsis that progressed to septic shock requiring vasopressors.   Workup suggested UTI, was started on vancomycin, Zosyn.  Blood cultures positive for E. coli, urine culture positive for > 100,000 colonies of E. coli and 30,000 colonies of Proteus mirabilis CT abdomen pelvis revealed moderate stool burden throughout with distention of the rectum, intra and extrahepatic biliary dilatation, CBD measuring 15 mm, no visible choledocholithiasis.  On 11/22, she was seen by GI and had MRCP performed which revealed intrahepatic, common hepatic, and CBD dilatation - ? ascending cholangitis.  GI recommended transfer to [facility] for further GI evaluation and for consideration of ERCP.  Given her shock state and low dose NE requirements, PCCM was consulted for transfer to [facility].  [facility] assumed care on 10/23/2023  Assessment & Plan    Principal Problem:   Septic shock (HCC) secondary to ascending cholelithiasis versus urosepsis -Currently off vasopressors.  Initially placed on broad-spectrum antibiotics.  She is s/p cholecystectomy. -CT, MRCP showed intrahepatic, common hepatic and CBD dilation 1.8 cm -Blood cultures positive for E. coli, urine culture positive for E. coli and Proteus mirabilis -Patient was placed on IV Zosyn, leukocytosis improving. -Patient was placed on midodrine 5 mg 3 times daily, will taper down as BP is now stable -GI consulted, recommended  ERCP with sphincterotomy and stone extraction, possibly tomorrow to allow Plavix washout -Will place on n.p.o. after midnight for possible ERCP tomorrow  Active Problems: E. coli bacteremia -Continue IV Zosyn, follow sensitivities -Will repeat blood cultures today  E. coli, Proteus urinary tract infection -Continue IV Zosyn    Hypothyroidism -Continue Synthroid 50 mcg daily  History of CVA, hyperlipidemia, residual left-sided weakness -Continue to hold statins due to transaminitis -Continue to hold Plavix for ERCP  Sundowning, acute metabolic encephalopathy superimposed on dementia -Overnight required Haldol for agitation -Continue Seroquel, Remeron, donepezil  Generalized debility -PT OT evaluation  Pressure injury documentation Upper mid to sacrum stage II, POA Wound description: Small reddened area with 0.5x 0.5 in shallow open area -Foam dressing every shift  Constipation -Continue bowel regimen with Colace, MiraLAX daily   Obesity Estimated body mass index is 35.04 kg/m as calculated from the following:   Height as of 06/06/23: 4\' 8"  (1.422 m).   Weight as of this encounter: 70.9 kg.  Code Status: DNR DVT Prophylaxis:  heparin injection 5,000 Units Start: 10/21/23 2300 SCDs Start: 10/21/23 2211   Level of Care: Level of care: Progressive Family Communication: Updated patient's daughter in law at the bedside Disposition  Plan:      Remains inpatient appropriate: Transfer to progressive floor   Procedures:    Consultants:   Admitted by CCM Gastroenterology  Antimicrobials:   Anti-infectives (From admission, onward)    Start     Dose/Rate Route Frequency Ordered Stop   10/21/23 2330  piperacillin-tazobactam (ZOSYN) IVPB 3.375 g        3.375 g 12.5 mL/hr over 240 Minutes Intravenous Every 8 hours 10/21/23 2231            Medications  Chlorhexidine Gluconate Cloth  6 each Topical Q2000   cholecalciferol  1,000 Units Oral Daily   cyanocobalamin   1,000 mcg Oral Daily   donepezil  5 mg Oral QHS   gabapentin  200 mg Oral q morning   heparin  5,000 Units Subcutaneous Q8H   levothyroxine  50 mcg Oral Q0600   midodrine  5 mg Oral TID WC   mirtazapine  7.5 mg Oral QHS   mouth rinse  15 mL Mouth Rinse 4 times per day   pantoprazole  40 mg Oral Daily   polyethylene glycol  17 g Oral Daily   QUEtiapine  25 mg Oral QPM      Subjective:   Sarah Freeman was seen and examined today.  Somnolent, per patient's daughter in law at the bedside, patient had received Haldol overnight due to sundowning and agitation.  BP now elevated, no fevers or chills, nausea or vomiting.   Objective:   Vitals:   10/23/23 0808 10/23/23 0830 10/23/23 0900 10/23/23 0904  BP:  (!) 157/77 (!) 174/79 (!) 163/74  Pulse:  78 85 91  Resp:  16 19 20   Temp: 97.9 F (36.6 C)     TempSrc: Axillary     SpO2:  97% 99% 99%  Weight:        Intake/Output Summary (Last 24 hours) at 10/23/2023 0923 Last data filed at 10/23/2023 1610 Gross per 24 hour  Intake 149.74 ml  Output 3075 ml  Net -2925.26 ml     Wt Readings from Last 3 Encounters:  10/23/23 70.9 kg  10/21/23 67 kg  06/06/23 77.4 kg     Exam General: Somnolent , NAD, appears comfortable Cardiovascular: S1 S2 auscultated,  RRR Respiratory: CT AB Gastrointestinal: Soft, ND, NBS  Ext: no pedal edema bilaterally Neuro: unable to assess  Skin: No rashes Psych: somnolent   Data Reviewed:  I have personally reviewed following labs    CBC Lab Results  Component Value Date   WBC 9.1 10/23/2023   RBC 3.72 (L) 10/23/2023   HGB 11.5 (L) 10/23/2023   HCT 35.4 (L) 10/23/2023   MCV 95.2 10/23/2023   MCH 30.9 10/23/2023   PLT 364 10/23/2023   MCHC 32.5 10/23/2023   RDW 13.5 10/23/2023   LYMPHSABS 3.7 10/23/2023   MONOABS 0.7 10/23/2023   EOSABS 0.7 (H) 10/23/2023   BASOSABS 0.1 10/23/2023     Last metabolic panel Lab Results  Component Value Date   NA 139 10/23/2023   K 4.2  10/23/2023   CL 108 10/23/2023   CO2 21 (L) 10/23/2023   BUN <5 (L) 10/23/2023   CREATININE 0.75 10/23/2023   GLUCOSE 90 10/23/2023   GFRNONAA >60 10/23/2023   GFRAA 77 06/16/2020   CALCIUM 8.6 (L) 10/23/2023   PHOS 3.6 10/22/2023   PROT 6.1 (L) 10/23/2023   ALBUMIN 2.3 (L) 10/23/2023   LABGLOB 3.2 05/17/2023   AGRATIO 1.3 01/04/2023   BILITOT 1.0 10/23/2023   ALKPHOS  206 (H) 10/23/2023   AST 106 (H) 10/23/2023   ALT 91 (H) 10/23/2023   ANIONGAP 10 10/23/2023    CBG (last 3)  Recent Labs    10/23/23 0337 10/23/23 0353 10/23/23 0804  GLUCAP 65* 74 83      Coagulation Profile: Recent Labs  Lab 10/20/23 2044 10/21/23 0504  INR 1.1 1.3*     Radiology Studies: I have personally reviewed the imaging studies  MR ABDOMEN MRCP W WO CONTAST  Result Date: 10/21/2023 CLINICAL DATA:  Abdominal pain with biliary dilatation EXAM: MRI ABDOMEN WITHOUT AND WITH CONTRAST (INCLUDING MRCP) TECHNIQUE: Multiplanar multisequence MR imaging of the abdomen was performed both before and after the administration of intravenous contrast. Heavily T2-weighted images of the biliary and pancreatic ducts were obtained, and three-dimensional MRCP images were rendered by post processing. Post-processing was applied at the acquisition scanner with concurrent physician supervision which includes 3D reconstructions, MIPs, volume rendered images and/or shaded surface rendering. CONTRAST:  6mL GADAVIST GADOBUTROL 1 MMOL/ML IV SOLN COMPARISON:  10/20/2023 FINDINGS: Despite efforts by the technologist and patient, prominent motion artifact is present on today's exam and could not be eliminated. This reduces exam sensitivity and specificity. This is a common result when MRCP is attempted in the inpatient setting where patients are less likely to be able to breath hold and cooperate in controlling motion. Lower chest: Trace right pleural effusion. Suspected mild dependent airspace opacity in the right lower lobe  based on coronal images. Mild cardiomegaly. Trace left pleural effusion. Hepatobiliary: Mild intrahepatic biliary dilatation with substantial fusiform dilation of the common hepatic duct and common bile duct, CBD caliber about 1.8 cm on image 8 series 10. Slightly abrupt distal conical tapering of the CBD is shown on image 9 series 10. No significant abnormal enhancement of the wall of the extrahepatic biliary tree. Subtle irregularity/thickening along the posterior wall of the common bile duct on image 20 through 21 of series 4 raise the possibility of a trace amount of layering debris or conceivably tiny gallstones although these are not well seen individually or in a definitive manner. There is no abrupt truncation of the distal CBD at the ampulla. No discrete ampullary mass. No discrete hepatic parenchymal lesion. Pancreas:  Unremarkable Spleen:  Unremarkable Adrenals/Urinary Tract: 6 mm Bosniak category 1 cyst of the right kidney upper pole. No further imaging workup of this lesion is indicated. Otherwise unremarkable. Stomach/Bowel: Sigmoid colon diverticulosis. Scattered diverticula the descending colon. Vascular/Lymphatic: Abdominal aortic atherosclerosis. As better shown on prior CT, atheromatous plaque narrows the origin of the celiac artery and likewise causes substantial narrowing proximally in the superior mesenteric artery, without overt occlusion. Other: Trace ascites along the inferior right hepatic lobe margin. Some of this also extends along the porta hepatis. Musculoskeletal: Sarcopenia. Substantial lumbar spondylosis and degenerative disc disease. IMPRESSION: 1. Mild intrahepatic biliary dilatation with substantial fusiform dilation of the common hepatic duct and common bile duct, CBD caliber about 1.8 cm. Slightly abrupt distal conical tapering of the CBD is shown. No discrete ampullary mass is identified. Subtle irregularity/thickening along the posterior wall of the common bile duct raise the  possibility of a trace amount of layering debris or conceivably tiny gallstones although these are not well seen individually or in a definitive manner. The extrahepatic biliary dilatation could be a physiologic response to prior cholecystectomy, sphincter of Oddi dysfunction/stricture, or type 1a choledochal cyst. 2. Trace ascites along the inferior right hepatic lobe margin and porta hepatis. 3. Trace right pleural effusion with  suspected mild dependent airspace opacity in the right lower lobe, atelectasis versus pneumonia. 4. Mild cardiomegaly. 5. Sigmoid colon diverticulosis. 6. Aortic atherosclerosis. As better shown on prior CT, atheromatous plaque narrows the origin of the celiac artery and likewise causes substantial narrowing proximally in the superior mesenteric artery, without overt occlusion. 7. Sarcopenia. 8. Substantial lumbar spondylosis and degenerative disc disease. 9. Despite efforts by the technologist and patient, motion artifact is present on today's exam and could not be eliminated. This reduces exam sensitivity and specificity. Electronically Signed   By: Gaylyn Rong M.D.   On: 10/21/2023 16:04   MR 3D Recon At Scanner  Result Date: 10/21/2023 CLINICAL DATA:  Abdominal pain with biliary dilatation EXAM: MRI ABDOMEN WITHOUT AND WITH CONTRAST (INCLUDING MRCP) TECHNIQUE: Multiplanar multisequence MR imaging of the abdomen was performed both before and after the administration of intravenous contrast. Heavily T2-weighted images of the biliary and pancreatic ducts were obtained, and three-dimensional MRCP images were rendered by post processing. Post-processing was applied at the acquisition scanner with concurrent physician supervision which includes 3D reconstructions, MIPs, volume rendered images and/or shaded surface rendering. CONTRAST:  6mL GADAVIST GADOBUTROL 1 MMOL/ML IV SOLN COMPARISON:  10/20/2023 FINDINGS: Despite efforts by the technologist and patient, prominent motion  artifact is present on today's exam and could not be eliminated. This reduces exam sensitivity and specificity. This is a common result when MRCP is attempted in the inpatient setting where patients are less likely to be able to breath hold and cooperate in controlling motion. Lower chest: Trace right pleural effusion. Suspected mild dependent airspace opacity in the right lower lobe based on coronal images. Mild cardiomegaly. Trace left pleural effusion. Hepatobiliary: Mild intrahepatic biliary dilatation with substantial fusiform dilation of the common hepatic duct and common bile duct, CBD caliber about 1.8 cm on image 8 series 10. Slightly abrupt distal conical tapering of the CBD is shown on image 9 series 10. No significant abnormal enhancement of the wall of the extrahepatic biliary tree. Subtle irregularity/thickening along the posterior wall of the common bile duct on image 20 through 21 of series 4 raise the possibility of a trace amount of layering debris or conceivably tiny gallstones although these are not well seen individually or in a definitive manner. There is no abrupt truncation of the distal CBD at the ampulla. No discrete ampullary mass. No discrete hepatic parenchymal lesion. Pancreas:  Unremarkable Spleen:  Unremarkable Adrenals/Urinary Tract: 6 mm Bosniak category 1 cyst of the right kidney upper pole. No further imaging workup of this lesion is indicated. Otherwise unremarkable. Stomach/Bowel: Sigmoid colon diverticulosis. Scattered diverticula the descending colon. Vascular/Lymphatic: Abdominal aortic atherosclerosis. As better shown on prior CT, atheromatous plaque narrows the origin of the celiac artery and likewise causes substantial narrowing proximally in the superior mesenteric artery, without overt occlusion. Other: Trace ascites along the inferior right hepatic lobe margin. Some of this also extends along the porta hepatis. Musculoskeletal: Sarcopenia. Substantial lumbar spondylosis  and degenerative disc disease. IMPRESSION: 1. Mild intrahepatic biliary dilatation with substantial fusiform dilation of the common hepatic duct and common bile duct, CBD caliber about 1.8 cm. Slightly abrupt distal conical tapering of the CBD is shown. No discrete ampullary mass is identified. Subtle irregularity/thickening along the posterior wall of the common bile duct raise the possibility of a trace amount of layering debris or conceivably tiny gallstones although these are not well seen individually or in a definitive manner. The extrahepatic biliary dilatation could be a physiologic response to prior cholecystectomy, sphincter  of Oddi dysfunction/stricture, or type 1a choledochal cyst. 2. Trace ascites along the inferior right hepatic lobe margin and porta hepatis. 3. Trace right pleural effusion with suspected mild dependent airspace opacity in the right lower lobe, atelectasis versus pneumonia. 4. Mild cardiomegaly. 5. Sigmoid colon diverticulosis. 6. Aortic atherosclerosis. As better shown on prior CT, atheromatous plaque narrows the origin of the celiac artery and likewise causes substantial narrowing proximally in the superior mesenteric artery, without overt occlusion. 7. Sarcopenia. 8. Substantial lumbar spondylosis and degenerative disc disease. 9. Despite efforts by the technologist and patient, motion artifact is present on today's exam and could not be eliminated. This reduces exam sensitivity and specificity. Electronically Signed   By: Gaylyn Rong M.D.   On: 10/21/2023 16:04       Barack Nicodemus M.D. [facility] 10/23/2023, 9:23 AM  Available via Epic secure chat 7am-7pm After 7 pm, please refer to night coverage provider listed on amion.

## 2023-10-24 ENCOUNTER — Other Ambulatory Visit: Payer: Self-pay

## 2023-10-24 DIAGNOSIS — R7881 Bacteremia: Secondary | ICD-10-CM | POA: Diagnosis not present

## 2023-10-24 DIAGNOSIS — R7989 Other specified abnormal findings of blood chemistry: Secondary | ICD-10-CM | POA: Diagnosis not present

## 2023-10-24 DIAGNOSIS — R6521 Severe sepsis with septic shock: Secondary | ICD-10-CM | POA: Diagnosis not present

## 2023-10-24 DIAGNOSIS — K8021 Calculus of gallbladder without cholecystitis with obstruction: Secondary | ICD-10-CM | POA: Diagnosis not present

## 2023-10-24 DIAGNOSIS — A419 Sepsis, unspecified organism: Secondary | ICD-10-CM | POA: Diagnosis not present

## 2023-10-24 LAB — COMPREHENSIVE METABOLIC PANEL
ALT: 56 U/L — ABNORMAL HIGH (ref 0–44)
AST: 45 U/L — ABNORMAL HIGH (ref 15–41)
Albumin: 2 g/dL — ABNORMAL LOW (ref 3.5–5.0)
Alkaline Phosphatase: 151 U/L — ABNORMAL HIGH (ref 38–126)
Anion gap: 6 (ref 5–15)
BUN: 7 mg/dL — ABNORMAL LOW (ref 8–23)
CO2: 22 mmol/L (ref 22–32)
Calcium: 8.1 mg/dL — ABNORMAL LOW (ref 8.9–10.3)
Chloride: 109 mmol/L (ref 98–111)
Creatinine, Ser: 0.94 mg/dL (ref 0.44–1.00)
GFR, Estimated: 58 mL/min — ABNORMAL LOW (ref >60)
Glucose, Bld: 79 mg/dL (ref 70–99)
Potassium: 3.7 mmol/L (ref 3.5–5.1)
Sodium: 137 mmol/L (ref 135–145)
Total Bilirubin: 0.5 mg/dL (ref <1.2)
Total Protein: 5.2 g/dL — ABNORMAL LOW (ref 6.5–8.1)

## 2023-10-24 LAB — CBC
HCT: 31.6 % — ABNORMAL LOW (ref 36.0–46.0)
Hemoglobin: 10.2 g/dL — ABNORMAL LOW (ref 12.0–15.0)
MCH: 31.4 pg (ref 26.0–34.0)
MCHC: 32.3 g/dL (ref 30.0–36.0)
MCV: 97.2 fL (ref 80.0–100.0)
Platelets: 379 10*3/uL (ref 150–400)
RBC: 3.25 MIL/uL — ABNORMAL LOW (ref 3.87–5.11)
RDW: 13.9 % (ref 11.5–15.5)
WBC: 8.2 10*3/uL (ref 4.0–10.5)
nRBC: 0 % (ref 0.0–0.2)

## 2023-10-24 LAB — GLUCOSE, CAPILLARY
Glucose-Capillary: 136 mg/dL — ABNORMAL HIGH (ref 70–99)
Glucose-Capillary: 156 mg/dL — ABNORMAL HIGH (ref 70–99)
Glucose-Capillary: 161 mg/dL — ABNORMAL HIGH (ref 70–99)
Glucose-Capillary: 68 mg/dL — ABNORMAL LOW (ref 70–99)
Glucose-Capillary: 75 mg/dL (ref 70–99)
Glucose-Capillary: 79 mg/dL (ref 70–99)
Glucose-Capillary: 99 mg/dL (ref 70–99)

## 2023-10-24 MED ORDER — SODIUM CHLORIDE 0.9 % IV SOLN
3.0000 g | Freq: Three times a day (TID) | INTRAVENOUS | Status: DC
Start: 2023-10-24 — End: 2023-10-27
  Administered 2023-10-24 – 2023-10-27 (×9): 3 g via INTRAVENOUS
  Filled 2023-10-24 (×9): qty 8

## 2023-10-24 NOTE — Care Management Important Message (Signed)
Important Message  Patient Details  Name: Sarah Freeman MRN: [id number] Date of Birth: Mar 25, 1931   Important Message Given:  Yes - Medicare IM     Dorena Bodo 10/24/2023, 3:40 PM

## 2023-10-24 NOTE — Progress Notes (Signed)
PROGRESS NOTE        PATIENT DETAILS Name: Sarah Freeman Age: 89 y.o. Sex: female Date of Birth: May 03, 1931 Admit Date: 10/21/2023 Admitting Physician Lynnell Catalan, MD UJW:[id number], Oralia Rud, DO  Brief Summary: Patient is a 89 y.o.  female with history of CVA, dementia-who presented with septic shock in the setting of coli bacteremia either from ascending cholangitis versus UTI.  Patient was stabilized in the ICU-transferred to [facility] on 11/24.    Significant events: 11/21>> Presented to [facility] for fevers and vomiting 11/22>> Transferred to [facility] for advanced ICU/GI care 11/24>> transfer to [facility]  Significant studies: 11/21>> CT abdomen/pelvis: Intrahepatic/extrahepatic biliary ductal dilatation, moderate stool volume. 11/22>> MRCP: Intrahepatic biliary dilatation, dilatation of the common hepatic/common bile duct-1.8 cm-brought distal clinical appearing of CBD stone.  Likely layering debris or tiny gallstones.  Significant microbiology data: 11/21>> influenza/RSV/COVID PCR: Negative 11/21>> urine culture: E. coli/Proteus mirabilis 11/21>> blood culture: E. Coli 11/24>> blood culture: No growth  Procedures: None  Consults: GI PCCM  Subjective: Pleasantly confused-no major issues overnight.  Objective: Vitals: Blood pressure 116/69, pulse 91, temperature 98 F (36.7 C), temperature source Oral, resp. rate 20, weight 68.9 kg, SpO2 99%.   Exam: Gen Exam: Pleasantly confused-not in any distress HEENT:atraumatic, normocephalic Chest: B/L clear to auscultation anteriorly CVS:S1S2 regular Abdomen:soft non tender, non distended Extremities:no edema Neurology: Non focal-generalized weakness Skin: no rash  Pertinent Labs/Radiology:    Latest Ref Rng & Units 10/24/2023    4:16 AM 10/23/2023    3:05 AM 10/22/2023    2:58 AM  CBC  WBC 4.0 - 10.5 K/uL 8.2  9.1  9.0   Hemoglobin 12.0 - 15.0 g/dL 29.5  62.1  9.3   Hematocrit 36.0 - 46.0 % 31.6  35.4   27.5   Platelets 150 - 400 K/uL 379  364  318     Lab Results  Component Value Date   NA 137 10/24/2023   K 3.7 10/24/2023   CL 109 10/24/2023   CO2 22 10/24/2023     Assessment/Plan: Septic shock secondary to E. coli bacteremia-likely due to ascending cholangitis versus complicated UTI Sepsis physiology improved BP relatively stable-midodrine being slowly tapered down Continue IV antibiotics GI planning ERCP with sphincterotomy after 5 days of Plavix washout  Acute metabolic encephalopathy superimposed on dementia Encephalopathy due to sepsis physiology Pleasantly confused Delirium precautions Supportive care  History of CVA with residual left-sided weakness Plavix on hold for potential ERCP with sphincterotomy Statin on hold as LFTs were elevated PT/OT eval   Hypothyroidism Synthroid  Dementia Supportive care/delirium precautions Aricept/Seroquel  Physical debility/deconditioning Worsened due to acute illness PT/OT eval  Pressure Ulcer: Agree with assessment and plan Pressure Injury 10/21/23 Sacrum Mid;Upper Stage 2 -  Partial thickness loss of dermis presenting as a shallow open injury with a red, pink wound bed without slough. small reddened area with 0.5 x 0.5in shallow open area (Active)  10/21/23 2200  Location: Sacrum  Location Orientation: Mid;Upper  Staging: Stage 2 -  Partial thickness loss of dermis presenting as a shallow open injury with a red, pink wound bed without slough.  Wound Description (Comments): small reddened area with 0.5 x 0.5in shallow open area  Present on Admission: Yes  Dressing Type Foam - Lift dressing to assess site every shift 10/23/23 2000   Obesity: Estimated body mass index is 34.05  kg/m as calculated from the following:   Height as of 06/06/23: 4\' 8"  (1.422 m).   Weight as of this encounter: 68.9 kg.   Code status:   Code Status: Limited: Do not attempt resuscitation (DNR) -DNR-LIMITED -Do Not Intubate/DNI    DVT  Prophylaxis: SCDs Start: 10/21/23 2211   Family Communication: Son at bedside   Disposition Plan: Status is: Inpatient Remains inpatient appropriate because: Severity of illness   Planned Discharge Destination:Skilled nursing facility   Diet: Diet Order             DIET DYS 3 Room service appropriate? Yes; Fluid consistency: Thin  Diet effective now                     Antimicrobial agents: Anti-infectives (From admission, onward)    Start     Dose/Rate Route Frequency Ordered Stop   10/21/23 2330  piperacillin-tazobactam (ZOSYN) IVPB 3.375 g        3.375 g 12.5 mL/hr over 240 Minutes Intravenous Every 8 hours 10/21/23 2231          MEDICATIONS: Scheduled Meds:  Chlorhexidine Gluconate Cloth  6 each Topical Q2000   cholecalciferol  1,000 Units Oral Daily   cyanocobalamin  1,000 mcg Oral Daily   donepezil  5 mg Oral QHS   gabapentin  200 mg Oral q morning   levothyroxine  50 mcg Oral Q0600   midodrine  2.5 mg Oral BID WC   mirtazapine  7.5 mg Oral QHS   mouth rinse  15 mL Mouth Rinse 4 times per day   pantoprazole  40 mg Oral Daily   polyethylene glycol  17 g Oral Daily   QUEtiapine  25 mg Oral QPM   Continuous Infusions:  piperacillin-tazobactam (ZOSYN)  IV 3.375 g (10/24/23 0007)   PRN Meds:.acetaminophen, docusate sodium, lip balm, mouth rinse, polyethylene glycol   I have personally reviewed following labs and imaging studies  LABORATORY DATA: CBC: Recent Labs  Lab 10/20/23 2044 10/21/23 0504 10/22/23 0258 10/23/23 0305 10/24/23 0416  WBC 20.0* 17.7* 9.0 9.1 8.2  NEUTROABS 14.8*  --   --  3.9  --   HGB 12.0 10.0* 9.3* 11.5* 10.2*  HCT 37.0 29.8* 27.5* 35.4* 31.6*  MCV 97.4 94.0 95.5 95.2 97.2  PLT 475* 383 318 364 379    Basic Metabolic Panel: Recent Labs  Lab 10/20/23 2044 10/21/23 0504 10/22/23 0258 10/23/23 0305 10/24/23 0416  NA 134* 136 138 139 137  K 3.8 3.5 3.0* 4.2 3.7  CL 98 103 108 108 109  CO2 26 24 24  21* 22   GLUCOSE 156* 169* 73 90 79  BUN 10 10 5* <5* 7*  CREATININE 0.81 0.76 0.78 0.75 0.94  CALCIUM 8.2* 8.1* 7.8* 8.6* 8.1*  MG  --  1.9 1.8  --   --   PHOS  --  3.5 3.6  --   --     GFR: Estimated Creatinine Clearance: 31 mL/min (by C-G formula based on SCr of 0.94 mg/dL).  Liver Function Tests: Recent Labs  Lab 10/20/23 2044 10/21/23 0504 10/22/23 0258 10/23/23 0305 10/24/23 0416  AST 431* 357* 168* 106* 45*  ALT 123* 139* 97* 91* 56*  ALKPHOS 309* 284* 178* 206* 151*  BILITOT 1.1 1.3* 1.6* 1.0 0.5  PROT 6.8 5.7* 4.8* 6.1* 5.2*  ALBUMIN 2.5* 2.4* 2.0* 2.3* 2.0*   No results for input(s): "LIPASE", "AMYLASE" in the last 168 hours. No results for input(s): "AMMONIA" in  the last 168 hours.  Coagulation Profile: Recent Labs  Lab 10/20/23 2044 10/21/23 0504  INR 1.1 1.3*    Cardiac Enzymes: No results for input(s): "CKTOTAL", "CKMB", "CKMBINDEX", "TROPONINI" in the last 168 hours.  BNP (last 3 results) No results for input(s): "PROBNP" in the last 8760 hours.  Lipid Profile: No results for input(s): "CHOL", "HDL", "LDLCALC", "TRIG", "CHOLHDL", "LDLDIRECT" in the last 72 hours.  Thyroid Function Tests: No results for input(s): "TSH", "T4TOTAL", "FREET4", "T3FREE", "THYROIDAB" in the last 72 hours.  Anemia Panel: No results for input(s): "VITAMINB12", "FOLATE", "FERRITIN", "TIBC", "IRON", "RETICCTPCT" in the last 72 hours.  Urine analysis:    Component Value Date/Time   COLORURINE AMBER (A) 10/20/2023 2044   APPEARANCEUR TURBID (A) 10/20/2023 2044   APPEARANCEUR Clear 01/11/2023 1029   LABSPEC 1.010 10/20/2023 2044   PHURINE 7.0 10/20/2023 2044   GLUCOSEU NEGATIVE 10/20/2023 2044   HGBUR SMALL (A) 10/20/2023 2044   BILIRUBINUR NEGATIVE 10/20/2023 2044   BILIRUBINUR Negative 01/11/2023 1029   KETONESUR NEGATIVE 10/20/2023 2044   PROTEINUR NEGATIVE 10/20/2023 2044   NITRITE POSITIVE (A) 10/20/2023 2044   LEUKOCYTESUR LARGE (A) 10/20/2023 2044    Sepsis  Labs: Lactic Acid, Venous    Component Value Date/Time   LATICACIDVEN 1.7 10/21/2023 0504    MICROBIOLOGY: Recent Results (from the past 240 hour(s))  Culture, blood (Routine x 2)     Status: Abnormal   Collection Time: 10/20/23  8:44 PM   Specimen: BLOOD  Result Value Ref Range Status   Specimen Description   Final    BLOOD BLOOD RIGHT ARM Performed at [facility], [address] [place], [place] [address]    Special Requests   Final    BOTTLES DRAWN AEROBIC AND ANAEROBIC Blood Culture adequate volume Performed at [facility], [address] [place], [place] [address]    Culture  Setup Time NO ORGANISMS SEEN ANAEROBIC BOTTLE ONLY   Final   Culture (A)  Final    ESCHERICHIA COLI SUSCEPTIBILITIES PERFORMED ON PREVIOUS CULTURE WITHIN THE LAST 5 DAYS. CRITICAL VALUE NOTED.  VALUE IS CONSISTENT WITH PREVIOUSLY REPORTED AND CALLED VALUE. Performed at [facility], [address], [place], [place] [address]    Report Status 10/23/2023 FINAL  Final  Culture, blood (Routine x 2)     Status: Abnormal   Collection Time: 10/20/23  8:44 PM   Specimen: BLOOD LEFT ARM  Result Value Ref Range Status   Specimen Description   Final    BLOOD LEFT ARM Performed at [facility], [address], [place], [place] [address]    Special Requests   Final    BOTTLES DRAWN AEROBIC AND ANAEROBIC Blood Culture results may not be optimal due to an excessive volume of blood received in culture bottles Performed at [facility], [address] [place], [place] [address]    Culture  Setup Time   Final    GRAM NEGATIVE RODS IN BOTH AEROBIC AND ANAEROBIC BOTTLES CRITICAL RESULT CALLED TO, READ BACK BY AND VERIFIED WITH: [facility] NAZARI AT 1120 10/21/23.PMF Performed at [facility], [address], [place], [place] [address]    Culture ESCHERICHIA COLI (A)  Final   Report Status 10/23/2023 FINAL  Final   Organism ID, Bacteria ESCHERICHIA COLI  Final    Organism ID, Bacteria ESCHERICHIA COLI  Final      Susceptibility   Escherichia coli - MIC*    AMPICILLIN 4 SENSITIVE Sensitive  CEFEPIME <=0.12 SENSITIVE Sensitive     CEFTAZIDIME <=1 SENSITIVE Sensitive     CEFTRIAXONE <=0.25 SENSITIVE Sensitive     CIPROFLOXACIN >=4 RESISTANT Resistant     GENTAMICIN <=1 SENSITIVE Sensitive     IMIPENEM <=0.25 SENSITIVE Sensitive     TRIMETH/SULFA <=20 SENSITIVE Sensitive     AMPICILLIN/SULBACTAM <=2 SENSITIVE Sensitive     PIP/TAZO <=4 SENSITIVE Sensitive ug/mL   Escherichia coli - KIRBY BAUER*    CEFAZOLIN SENSITIVE Sensitive     * ESCHERICHIA COLI    ESCHERICHIA COLI  Resp panel by RT-PCR (RSV, Flu A&B, Covid) In/Out Cath Urine     Status: None   Collection Time: 10/20/23  8:44 PM   Specimen: In/Out Cath Urine; Nasal Swab  Result Value Ref Range Status   SARS Coronavirus 2 by RT PCR NEGATIVE NEGATIVE Final    Comment: (NOTE) SARS-CoV-2 target nucleic acids are NOT DETECTED.  The SARS-CoV-2 RNA is generally detectable in upper respiratory specimens during the acute phase of infection. The lowest concentration of SARS-CoV-2 viral copies this assay can detect is 138 copies/mL. A negative result does not preclude SARS-Cov-2 infection and should not be used as the sole basis for treatment or other patient management decisions. A negative result may occur with  improper specimen collection/handling, submission of specimen other than nasopharyngeal swab, presence of viral mutation(s) within the areas targeted by this assay, and inadequate number of viral copies(<138 copies/mL). A negative result must be combined with clinical observations, patient history, and epidemiological information. The expected result is Negative.  Fact Sheet for Patients:  BloggerCourse.com  Fact Sheet for Healthcare Providers:  SeriousBroker.it  This test is no t yet approved or cleared by the [place] FDA  and  has been authorized for detection and/or diagnosis of SARS-CoV-2 by FDA under an Emergency Use Authorization (EUA). This EUA will remain  in effect (meaning this test can be used) for the duration of the COVID-19 declaration under Section 564(b)(1) of the Act, 21 U.S.C.section 360bbb-3(b)(1), unless the authorization is terminated  or revoked sooner.       Influenza A by PCR NEGATIVE NEGATIVE Final   Influenza B by PCR NEGATIVE NEGATIVE Final    Comment: (NOTE) The Xpert Xpress SARS-CoV-2/FLU/RSV plus assay is intended as an aid in the diagnosis of influenza from Nasopharyngeal swab specimens and should not be used as a sole basis for treatment. Nasal washings and aspirates are unacceptable for Xpert Xpress SARS-CoV-2/FLU/RSV testing.  Fact Sheet for Patients: BloggerCourse.com  Fact Sheet for Healthcare Providers: SeriousBroker.it  This test is not yet approved or cleared by the [place] FDA and has been authorized for detection and/or diagnosis of SARS-CoV-2 by FDA under an Emergency Use Authorization (EUA). This EUA will remain in effect (meaning this test can be used) for the duration of the COVID-19 declaration under Section 564(b)(1) of the Act, 21 U.S.C. section 360bbb-3(b)(1), unless the authorization is terminated or revoked.     Resp Syncytial Virus by PCR NEGATIVE NEGATIVE Final    Comment: (NOTE) Fact Sheet for Patients: BloggerCourse.com  Fact Sheet for Healthcare Providers: SeriousBroker.it  This test is not yet approved or cleared by the [place] FDA and has been authorized for detection and/or diagnosis of SARS-CoV-2 by FDA under an Emergency Use Authorization (EUA). This EUA will remain in effect (meaning this test can be used) for the duration of the COVID-19 declaration under Section 564(b)(1) of the Act, 21 U.S.C. section 360bbb-3(b)(1),  unless the authorization is terminated  or revoked.  Performed at [facility], [address] [place], [place] [address]   Urine Culture     Status: Abnormal   Collection Time: 10/20/23  8:44 PM   Specimen: Urine, Catheterized  Result Value Ref Range Status   Specimen Description   Final    URINE, CATHETERIZED Performed at [facility], [address] [place], [place] [address]    Special Requests   Final    NONE Reflexed from [phone] Performed at [facility], [address] [place], [place] [address]    Culture (A)  Final    >=100,000 COLONIES/mL ESCHERICHIA COLI 30,000 COLONIES/mL PROTEUS MIRABILIS    Report Status 10/23/2023 FINAL  Final   Organism ID, Bacteria ESCHERICHIA COLI (A)  Final   Organism ID, Bacteria PROTEUS MIRABILIS (A)  Final      Susceptibility   Escherichia coli - MIC*    AMPICILLIN 4 SENSITIVE Sensitive     CEFAZOLIN <=4 SENSITIVE Sensitive     CEFEPIME <=0.12 SENSITIVE Sensitive     CEFTRIAXONE <=0.25 SENSITIVE Sensitive     CIPROFLOXACIN >=4 RESISTANT Resistant     GENTAMICIN <=1 SENSITIVE Sensitive     IMIPENEM <=0.25 SENSITIVE Sensitive     NITROFURANTOIN 128 RESISTANT Resistant     TRIMETH/SULFA <=20 SENSITIVE Sensitive     AMPICILLIN/SULBACTAM <=2 SENSITIVE Sensitive     PIP/TAZO <=4 SENSITIVE Sensitive ug/mL    * >=100,000 COLONIES/mL ESCHERICHIA COLI   Proteus mirabilis - MIC*    AMPICILLIN <=2 SENSITIVE Sensitive     CEFAZOLIN <=4 SENSITIVE Sensitive     CEFEPIME <=0.12 SENSITIVE Sensitive     CEFTRIAXONE <=0.25 SENSITIVE Sensitive     CIPROFLOXACIN <=0.25 SENSITIVE Sensitive     GENTAMICIN <=1 SENSITIVE Sensitive     IMIPENEM 2 SENSITIVE Sensitive     NITROFURANTOIN 128 RESISTANT Resistant     TRIMETH/SULFA <=20 SENSITIVE Sensitive     AMPICILLIN/SULBACTAM <=2 SENSITIVE Sensitive     PIP/TAZO <=4 SENSITIVE Sensitive ug/mL    * 30,000 COLONIES/mL PROTEUS MIRABILIS  Blood Culture ID Panel (Reflexed)      Status: Abnormal   Collection Time: 10/20/23  8:44 PM  Result Value Ref Range Status   Enterococcus faecalis NOT DETECTED NOT DETECTED Final   Enterococcus Faecium NOT DETECTED NOT DETECTED Final   Listeria monocytogenes NOT DETECTED NOT DETECTED Final   Staphylococcus species NOT DETECTED NOT DETECTED Final   Staphylococcus aureus (BCID) NOT DETECTED NOT DETECTED Final   Staphylococcus epidermidis NOT DETECTED NOT DETECTED Final   Staphylococcus lugdunensis NOT DETECTED NOT DETECTED Final   Streptococcus species NOT DETECTED NOT DETECTED Final   Streptococcus agalactiae NOT DETECTED NOT DETECTED Final   Streptococcus pneumoniae NOT DETECTED NOT DETECTED Final   Streptococcus pyogenes NOT DETECTED NOT DETECTED Final   A.calcoaceticus-baumannii NOT DETECTED NOT DETECTED Final   Bacteroides fragilis NOT DETECTED NOT DETECTED Final   Enterobacterales DETECTED (A) NOT DETECTED Final    Comment: Enterobacterales represent a large order of gram negative bacteria, not a single organism. CRITICAL RESULT CALLED TO, READ BACK BY AND VERIFIED WITH: WALID NAZARI AT 1120 10/21/23.PMF    Enterobacter cloacae complex NOT DETECTED NOT DETECTED Final   Escherichia coli DETECTED (A) NOT DETECTED Final    Comment: CRITICAL RESULT CALLED TO, READ BACK BY AND VERIFIED WITH: WALID NAZARI AT 1120 10/21/23.PMF    Klebsiella aerogenes NOT DETECTED NOT DETECTED Final   Klebsiella oxytoca NOT DETECTED NOT DETECTED Final   Klebsiella pneumoniae NOT  DETECTED NOT DETECTED Final   Proteus species NOT DETECTED NOT DETECTED Final   Salmonella species NOT DETECTED NOT DETECTED Final   Serratia marcescens NOT DETECTED NOT DETECTED Final   Haemophilus influenzae NOT DETECTED NOT DETECTED Final   Neisseria meningitidis NOT DETECTED NOT DETECTED Final   Pseudomonas aeruginosa NOT DETECTED NOT DETECTED Final   Stenotrophomonas maltophilia NOT DETECTED NOT DETECTED Final   Candida albicans NOT DETECTED NOT DETECTED  Final   Candida auris NOT DETECTED NOT DETECTED Final   Candida glabrata NOT DETECTED NOT DETECTED Final   Candida krusei NOT DETECTED NOT DETECTED Final   Candida parapsilosis NOT DETECTED NOT DETECTED Final   Candida tropicalis NOT DETECTED NOT DETECTED Final   Cryptococcus neoformans/gattii NOT DETECTED NOT DETECTED Final   CTX-M ESBL NOT DETECTED NOT DETECTED Final   Carbapenem resistance IMP NOT DETECTED NOT DETECTED Final   Carbapenem resistance KPC NOT DETECTED NOT DETECTED Final   Carbapenem resistance NDM NOT DETECTED NOT DETECTED Final   Carbapenem resist OXA 48 LIKE NOT DETECTED NOT DETECTED Final   Carbapenem resistance VIM NOT DETECTED NOT DETECTED Final    Comment: Performed at [facility], [address] [place], [place] [address]  MRSA Next Gen by PCR, Nasal     Status: None   Collection Time: 10/21/23  2:13 AM   Specimen: Nasal Mucosa; Nasal Swab  Result Value Ref Range Status   MRSA by PCR Next Gen NOT DETECTED NOT DETECTED Final    Comment: (NOTE) The GeneXpert MRSA Assay (FDA approved for NASAL specimens only), is one component of a comprehensive MRSA colonization surveillance program. It is not intended to diagnose MRSA infection nor to guide or monitor treatment for MRSA infections. Test performance is not FDA approved in patients less than 25 years old. Performed at [facility], [address] [place], [place] [address]   Culture, blood (Routine X 2) w Reflex to ID Panel     Status: None (Preliminary result)   Collection Time: 10/23/23  9:47 AM   Specimen: BLOOD LEFT ARM  Result Value Ref Range Status   Specimen Description BLOOD LEFT ARM  Final   Special Requests   Final    BOTTLES DRAWN AEROBIC AND ANAEROBIC Blood Culture results may not be optimal due to an excessive volume of blood received in culture bottles   Culture   Final    NO GROWTH < 24 HOURS Performed at [facility], [address], [place], [place] [address]     Report Status PENDING  Incomplete  Culture, blood (Routine X 2) w Reflex to ID Panel     Status: None (Preliminary result)   Collection Time: 10/23/23  9:57 AM   Specimen: BLOOD RIGHT HAND  Result Value Ref Range Status   Specimen Description BLOOD RIGHT HAND  Final   Special Requests   Final    BOTTLES DRAWN AEROBIC ONLY Blood Culture adequate volume   Culture   Final    NO GROWTH < 24 HOURS Performed at [facility], [address], [place], [place] [address]    Report Status PENDING  Incomplete    RADIOLOGY STUDIES/RESULTS: No results found.   LOS: 3 days   Jeoffrey Massed, MD  [facility]    To contact the attending provider between 7A-7P or the covering provider during after hours 7P-7A, please log into the web site [email or web address] and access using universal [place] password for that web site. If you do  not have the password, please call the hospital operator.  10/24/2023, 9:18 AM

## 2023-10-24 NOTE — Progress Notes (Signed)
    Gastroenterology Inpatient Follow Up    Subjective: Patient is confused.  Patient's son and daughter are at bedside.  Objective: Vital signs in last 24 hours: Temp:  [97.8 F (36.6 C)-98 F (36.7 C)] 98 F (36.7 C) (11/25 0800) Pulse Rate:  [61-91] 78 (11/25 1635) Resp:  [12-20] 19 (11/25 1635) BP: (84-116)/(48-76) 107/76 (11/25 1635) SpO2:  [95 %-99 %] 96 % (11/25 1635) Weight:  [68.9 kg] 68.9 kg (11/25 0500) Last BM Date : 10/23/23  Intake/Output from previous day: 11/24 0701 - 11/25 0700 In: 174.7 [P.O.:130; IV Piggyback:44.7] Out: 950 [Urine:950] Intake/Output this shift: No intake/output data recorded.  General appearance: alert and cooperative Resp: no increased WOB Cardio: regular rate GI: non-tender, non-distended  Lab Results: Recent Labs    10/22/23 0258 10/23/23 0305 10/24/23 0416  WBC 9.0 9.1 8.2  HGB 9.3* 11.5* 10.2*  HCT 27.5* 35.4* 31.6*  PLT 318 364 379   BMET Recent Labs    10/22/23 0258 10/23/23 0305 10/24/23 0416  NA 138 139 137  K 3.0* 4.2 3.7  CL 108 108 109  CO2 24 21* 22  GLUCOSE 73 90 79  BUN 5* <5* 7*  CREATININE 0.78 0.75 0.94  CALCIUM 7.8* 8.6* 8.1*   LFT Recent Labs    10/22/23 0258 10/23/23 0305 10/24/23 0416  PROT 4.8*   < > 5.2*  ALBUMIN 2.0*   < > 2.0*  AST 168*   < > 45*  ALT 97*   < > 56*  ALKPHOS 178*   < > 151*  BILITOT 1.6*   < > 0.5  BILIDIR 0.9*  --   --   IBILI 0.7  --   --    < > = values in this interval not displayed.   PT/INR No results for input(s): "LABPROT", "INR" in the last 72 hours. Hepatitis Panel No results for input(s): "HEPBSAG", "HCVAB", "HEPAIGM", "HEPBIGM" in the last 72 hours. C-Diff No results for input(s): "CDIFFTOX" in the last 72 hours.  Studies/Results: No results found.  Medications: I have reviewed the patient's current medications. Scheduled:  Chlorhexidine Gluconate Cloth  6 each Topical Q2000   cholecalciferol  1,000 Units Oral Daily   cyanocobalamin  1,000  mcg Oral Daily   donepezil  5 mg Oral QHS   gabapentin  200 mg Oral q morning   levothyroxine  50 mcg Oral Q0600   midodrine  2.5 mg Oral BID WC   mirtazapine  7.5 mg Oral QHS   mouth rinse  15 mL Mouth Rinse 4 times per day   pantoprazole  40 mg Oral Daily   polyethylene glycol  17 g Oral Daily   QUEtiapine  25 mg Oral QPM   Continuous:  ampicillin-sulbactam (UNASYN) IV 3 g (10/24/23 1748)   OHY:[id number], docusate sodium, lip balm, mouth rinse, polyethylene glycol  Assessment/Plan: 89 year old female presented with ascending cholangitis and UTI with E. coli bacteremia.  Patient has responded to antibiotic therapy.  MRCP shows mild intrahepatic biliary ductal dilation and dilated CBD at 1.8 cm.  Some subtle irregularity/thickening along the posterior wall of the CBD does raise concern for debris or gallstones.  LFTs have been downtrending over time.  Plan is for ERCP tomorrow after Plavix washout.  Patient's daughter Kennon Rounds and son Tyree Arocha make decisions on behalf of the patient.   LOS: 3 days   Imogene Burn 10/24/2023, 5:47 PM

## 2023-10-24 NOTE — Progress Notes (Signed)
Spoke with patient's son Kadince Saeed over phone. We discussed the risks and benefits of ERCP to remove bile duct stones. He agrees to sign consent.   Patient is already on antibiotics. Made NPO after MN for ERCP tomorrow.

## 2023-10-24 NOTE — Evaluation (Signed)
Occupational Therapy Evaluation Patient Details Name: Sarah Freeman MRN: [id number] DOB: August 03, 1931 Today's Date: 10/24/2023   History of Present Illness 89 y.o.  female with history of CVA, dementia-who presented with septic shock in the setting of coli bacteremia; MRI/MRCP concerning for acute cholangitis. Plan for ERCP. Acute metabolic encephalopathy. PMH: dementia, prior CVA in 2017; 2024 with L hemiplegia, hypertension, hyperlipidemia, bilateral carotid artery stenosis, hypothyroidism, stage III CKD, GERD, aortic atherosclerosis.   Clinical Impression   At baseline, Sarah Freeman is a resident of [facility] since her last CVA in 05/2023. At baseline, Sarah Freeman is able to feed herself with set up/S due to pocketing L cheek and requires total A with other ADL tasks. Staff uses a hoyer to mobilize to her wc daily. Pt appears close to baseline level of function regarding ADL and functional mobility for ADL. Family able to manage LUE ROM and positioning. Will defer further OT needs to SNF. OT signing off.       If plan is discharge home, recommend the following:  (NA)    Functional Status Assessment  Patient has not had a recent decline in their functional status  Equipment Recommendations  None recommended by OT    Recommendations for Other Services       Precautions / Restrictions Precautions Precautions: Fall Precaution Comments: sacral wound per family Restrictions Weight Bearing Restrictions: No      Mobility Bed Mobility Overal bed mobility: Needs Assistance Bed Mobility: Rolling Rolling: Max assist, +2 for physical assistance              Transfers                   General transfer comment: hoyer      Balance                                           ADL either performed or assessed with clinical judgement   ADL Overall ADL's : Needs assistance/impaired;At baseline Eating/Feeding: Moderate assistance Eating/Feeding Details  (indicate cue type and reason): assist with pocketing and set up; does better wiht finger foods                                         Vision   Additional Comments: most likely baseline since CVA     Perception Perception: Impaired   Perception-Other Comments: L inattention   Praxis         Pertinent Vitals/Pain Pain Assessment Pain Assessment: Faces Faces Pain Scale: No hurt     Extremity/Trunk Assessment Upper Extremity Assessment Upper Extremity Assessment: Right hand dominant;LUE deficits/detail LUE Deficits / Details: L hemiplegia form previous CVA; flexor tone; tihgtness noted in flexors however overall PROM WFL with wrist @ neutral and shoulder to 90 FF; no complaints of pain with LUE PROM; non-functional LUE Coordination: decreased fine motor;decreased gross motor   Lower Extremity Assessment Lower Extremity Assessment: Defer to PT evaluation (movement noted in L foot)       Communication Communication Communication: Hearing impairment   Cognition Arousal: Alert Behavior During Therapy: Flat affect Overall Cognitive Status: History of cognitive impairments - at baseline  General Comments       Exercises Exercises: General Upper Extremity General Exercises - Upper Extremity Shoulder Flexion: Left, PROM, 5 reps Shoulder ABduction: PROM, Left, 5 reps Elbow Flexion: PROM, Left, 5 reps Elbow Extension: PROM, Left, 5 reps Wrist Flexion: PROM, Left, 5 reps Wrist Extension: PROM, Left, 5 reps Digit Composite Flexion: PROM, Left, 5 reps Composite Extension: PROM, Left, 5 reps   Shoulder Instructions      Home Living Family/patient expects to be discharged to:: Skilled nursing facility                                        Prior Functioning/Environment Prior Level of Function : Needs assist               ADLs Comments: total A for ADL wtih the exception of able  to feed herself; staff mobilizes her to a wc using a hoyer        OT Problem List: Decreased range of motion;Decreased strength      OT Treatment/Interventions:      OT Goals(Current goals can be found in the care plan section) Acute Rehab OT Goals Patient Stated Goal: For her Mom to continue with PT at [facility] OT Goal Formulation: All assessment and education complete, DC therapy  OT Frequency:      Co-evaluation              AM-PAC OT "6 Clicks" Daily Activity     Outcome Measure Help from another person eating meals?: A Lot Help from another person taking care of personal grooming?: A Lot Help from another person toileting, which includes using toliet, bedpan, or urinal?: Total Help from another person bathing (including washing, rinsing, drying)?: Total Help from another person to put on and taking off regular upper body clothing?: Total Help from another person to put on and taking off regular lower body clothing?: Total 6 Click Score: 8   End of Session Nurse Communication: Mobility status;Other (comment) (L mitt left off while self feeding with daughter)  Activity Tolerance: Patient tolerated treatment well Patient left: in bed;with call bell/phone within reach;with family/visitor present;Other (comment) (modified chair position)  OT Visit Diagnosis: Muscle weakness (generalized) (M62.81)                Time: 6295-2841 OT Time Calculation (min): 25 min Charges:  OT General Charges $OT Visit: 1 Visit OT Evaluation $OT Eval Moderate Complexity: 1 Mod OT Treatments $Self Care/Home Management : 8-22 mins  hyperlipidemia   Renessa Wellnitz,HILLARY 10/24/2023, 1:21 PM

## 2023-10-24 NOTE — Progress Notes (Signed)
Ok to optimize zosyn to Unasyn 3g IV q8.  Ulyses Southward, PharmD, BCIDP, AAHIVP, CPP Infectious Disease Pharmacist 10/24/2023 12:19 PM

## 2023-10-24 NOTE — TOC Initial Note (Signed)
Transition of Care [facility]) - Initial/Assessment Note    Patient Details  Name: Sarah Freeman MRN: [id number] Date of Birth: Mar 31, 1931  [facility]) CM/SW Contact:    Mearl Latin, LCSW Phone Number: 10/24/2023, 3:45 PM  Clinical Narrative:                 Patient transferred from ICU. CSW will continue to follow. [facility] updated.   Expected Discharge Plan: Skilled Nursing Facility Barriers to Discharge: Continued Medical Work up   Patient Goals and CMS Choice            Expected Discharge Plan and Services In-house Referral: Clinical Social Work                                            Prior Living Arrangements/Services     Patient language and need for interpreter reviewed:: Yes Do you feel safe going back to the place where you live?: Yes      Need for Family Participation in Patient Care: Yes (Comment) Care giver support system in place?: Yes (comment)   Criminal Activity/Legal Involvement Pertinent to Current Situation/Hospitalization: No - Comment as needed  Activities of Daily Living   ADL Screening (condition at time of admission) Independently performs ADLs?: No Does the patient have a NEW difficulty with bathing/dressing/toileting/self-feeding that is expected to last >3 days?: No Does the patient have a NEW difficulty with getting in/out of bed, walking, or climbing stairs that is expected to last >3 days?: No Does the patient have a NEW difficulty with communication that is expected to last >3 days?: No Is the patient deaf or have difficulty hearing?: No Does the patient have difficulty seeing, even when wearing glasses/contacts?: No Does the patient have difficulty concentrating, remembering, or making decisions?: Yes  Permission Sought/Granted Permission sought to share information with : Facility Medical sales representative, Family Supports    Share Information with NAME: [name]  Permission granted to share info  w AGENCY: Licensed conveyancer granted to share info w Relationship: Daughter  Permission granted to share info w Contact Information: [phone]  Emotional Assessment Appearance:: Appears stated age Attitude/Demeanor/Rapport: Unable to Assess Affect (typically observed): Unable to Assess Orientation: : Oriented to Self Alcohol / Substance Use: Not Applicable Psych Involvement: No (comment)  Admission diagnosis:  Septic shock (HCC) [A41.9, R65.21] Patient Active Problem List   Diagnosis Date Noted   E coli bacteremia 10/22/2023   Calculus of gallbladder with biliary obstruction but without cholecystitis 10/22/2023   Septic shock (HCC) 10/21/2023   Elevated LFTs 10/21/2023   Hemiparesis affecting left side as late effect of cerebrovascular accident (CVA) (HCC) 10/21/2023   Fever 10/21/2023   Dilated cbd, acquired 10/21/2023   Pressure injury of skin 10/21/2023   Stroke-like symptoms 06/06/2023   Left hemiplegia (HCC) 05/19/2023   History of CVA with residual deficit 05/17/2023   Dyslipidemia 04/06/2023   Memory loss 12/29/2021   Aortic atherosclerosis (HCC) 02/27/2021   SVT (supraventricular tachycardia) (HCC) 10/11/2018   HTN (hypertension) 10/11/2018   Chronic kidney disease, stage 3 (HCC) 05/24/2018   Morbid obesity (HCC) 02/02/2018   Carotid stenosis 02/01/2018   Left arm weakness 01/18/2018   Urinary tract infection 11/04/2017   Statin intolerance 06/14/2017   Counseling regarding advanced directives and goals of care 02/22/2017   Weakness of left leg 02/25/2016   TIA (  transient ischemic attack) 02/15/2016   Osteopenia 01/29/2016   Menopausal state 01/29/2016   Hypothyroidism 01/29/2016   GERD (gastroesophageal reflux disease) 01/29/2016   Hypercholesterolemia 01/29/2016   Lumbar spinal stenosis 01/29/2016   Stress incontinence 01/29/2016   Breast microcalcification, mammographic 06/17/2015   PCP:  Dorcas Carrow, DO Pharmacy:   [facility] - [place], [place] - [address] [place] [address] Phone: [phone] Fax: [phone]  [id number] - [place], [place] - [address] AT [facility] [address] [place] [address] Phone: [phone] Fax: [phone]     Social Determinants of Health (SDOH) Social History: SDOH Screenings   Food Insecurity: No Food Insecurity (10/24/2023)  Housing: Low Risk  (10/24/2023)  Transportation Needs: No Transportation Needs (10/24/2023)  Utilities: Not At Risk (10/24/2023)  Alcohol Screen: Low Risk  (02/01/2023)  Depression (PHQ2-9): Low Risk  (05/17/2023)  Financial Resource Strain: Low Risk  (02/01/2023)  Physical Activity: Insufficiently Active (02/01/2023)  Social Connections: Moderately Isolated (02/01/2023)  Stress: No Stress Concern Present (02/01/2023)  Tobacco Use: Low Risk  (10/20/2023)   SDOH Interventions:     Readmission Risk Interventions    10/21/2023    1:46 PM  Readmission Risk Prevention Plan  Transportation Screening Complete  PCP or Specialist Appt within 3-5 Days Complete  HRI or Home Care Consult Complete  Social Work Consult for Recovery Care Planning/Counseling Complete  Palliative Care Screening Complete  Medication Review Oceanographer) Complete

## 2023-10-24 NOTE — H&P (View-Only) (Signed)
    Gastroenterology Inpatient Follow Up    Subjective: Patient is confused.  Patient's son and daughter are at bedside.  Objective: Vital signs in last 24 hours: Temp:  [97.8 F (36.6 C)-98 F (36.7 C)] 98 F (36.7 C) (11/25 0800) Pulse Rate:  [61-91] 78 (11/25 1635) Resp:  [12-20] 19 (11/25 1635) BP: (84-116)/(48-76) 107/76 (11/25 1635) SpO2:  [95 %-99 %] 96 % (11/25 1635) Weight:  [68.9 kg] 68.9 kg (11/25 0500) Last BM Date : 10/23/23  Intake/Output from previous day: 11/24 0701 - 11/25 0700 In: 174.7 [P.O.:130; IV Piggyback:44.7] Out: 950 [Urine:950] Intake/Output this shift: No intake/output data recorded.  General appearance: alert and cooperative Resp: no increased WOB Cardio: regular rate GI: non-tender, non-distended  Lab Results: Recent Labs    10/22/23 0258 10/23/23 0305 10/24/23 0416  WBC 9.0 9.1 8.2  HGB 9.3* 11.5* 10.2*  HCT 27.5* 35.4* 31.6*  PLT 318 364 379   BMET Recent Labs    10/22/23 0258 10/23/23 0305 10/24/23 0416  NA 138 139 137  K 3.0* 4.2 3.7  CL 108 108 109  CO2 24 21* 22  GLUCOSE 73 90 79  BUN 5* <5* 7*  CREATININE 0.78 0.75 0.94  CALCIUM 7.8* 8.6* 8.1*   LFT Recent Labs    10/22/23 0258 10/23/23 0305 10/24/23 0416  PROT 4.8*   < > 5.2*  ALBUMIN 2.0*   < > 2.0*  AST 168*   < > 45*  ALT 97*   < > 56*  ALKPHOS 178*   < > 151*  BILITOT 1.6*   < > 0.5  BILIDIR 0.9*  --   --   IBILI 0.7  --   --    < > = values in this interval not displayed.   PT/INR No results for input(s): "LABPROT", "INR" in the last 72 hours. Hepatitis Panel No results for input(s): "HEPBSAG", "HCVAB", "HEPAIGM", "HEPBIGM" in the last 72 hours. C-Diff No results for input(s): "CDIFFTOX" in the last 72 hours.  Studies/Results: No results found.  Medications: I have reviewed the patient's current medications. Scheduled:  Chlorhexidine Gluconate Cloth  6 each Topical Q2000   cholecalciferol  1,000 Units Oral Daily   cyanocobalamin  1,000  mcg Oral Daily   donepezil  5 mg Oral QHS   gabapentin  200 mg Oral q morning   levothyroxine  50 mcg Oral Q0600   midodrine  2.5 mg Oral BID WC   mirtazapine  7.5 mg Oral QHS   mouth rinse  15 mL Mouth Rinse 4 times per day   pantoprazole  40 mg Oral Daily   polyethylene glycol  17 g Oral Daily   QUEtiapine  25 mg Oral QPM   Continuous:  ampicillin-sulbactam (UNASYN) IV 3 g (10/24/23 1748)   AVW:[id number], docusate sodium, lip balm, mouth rinse, polyethylene glycol  Assessment/Plan: 89 year old female presented with ascending cholangitis and UTI with E. coli bacteremia.  Patient has responded to antibiotic therapy.  MRCP shows mild intrahepatic biliary ductal dilation and dilated CBD at 1.8 cm.  Some subtle irregularity/thickening along the posterior wall of the CBD does raise concern for debris or gallstones.  LFTs have been downtrending over time.  Plan is for ERCP tomorrow after Plavix washout.  Patient's daughter Kennon Rounds and son Erina Giorgianni make decisions on behalf of the patient.   LOS: 3 days   Imogene Burn 10/24/2023, 5:47 PM

## 2023-10-24 NOTE — Plan of Care (Signed)

## 2023-10-24 NOTE — Plan of Care (Signed)
  Problem: Clinical Measurements: Goal: Respiratory complications will improve Outcome: Progressing Goal: Cardiovascular complication will be avoided Outcome: Progressing   Problem: Coping: Goal: Level of anxiety will decrease Outcome: Progressing   Problem: Elimination: Goal: Will not experience complications related to bowel motility Outcome: Progressing   Problem: Safety: Goal: Ability to remain free from injury will improve Outcome: Progressing

## 2023-10-25 ENCOUNTER — Inpatient Hospital Stay (HOSPITAL_COMMUNITY): Payer: Medicare Other

## 2023-10-25 ENCOUNTER — Inpatient Hospital Stay (HOSPITAL_COMMUNITY): Payer: Medicare Other | Admitting: Certified Registered Nurse Anesthetist

## 2023-10-25 ENCOUNTER — Encounter (HOSPITAL_COMMUNITY)
Admission: EM | Disposition: A | Discharge status: Skilled Nursing Facility | Payer: Self-pay | Source: Other Acute Inpatient Hospital | Attending: Internal Medicine

## 2023-10-25 ENCOUNTER — Encounter (HOSPITAL_COMMUNITY): Payer: Self-pay | Admitting: Pulmonary Disease

## 2023-10-25 DIAGNOSIS — K838 Other specified diseases of biliary tract: Secondary | ICD-10-CM | POA: Diagnosis not present

## 2023-10-25 DIAGNOSIS — K8050 Calculus of bile duct without cholangitis or cholecystitis without obstruction: Secondary | ICD-10-CM | POA: Diagnosis not present

## 2023-10-25 DIAGNOSIS — A419 Sepsis, unspecified organism: Secondary | ICD-10-CM | POA: Diagnosis not present

## 2023-10-25 DIAGNOSIS — Z9049 Acquired absence of other specified parts of digestive tract: Secondary | ICD-10-CM

## 2023-10-25 DIAGNOSIS — R7989 Other specified abnormal findings of blood chemistry: Secondary | ICD-10-CM | POA: Diagnosis not present

## 2023-10-25 DIAGNOSIS — E039 Hypothyroidism, unspecified: Secondary | ICD-10-CM | POA: Diagnosis not present

## 2023-10-25 DIAGNOSIS — R6521 Severe sepsis with septic shock: Secondary | ICD-10-CM | POA: Diagnosis not present

## 2023-10-25 HISTORY — PX: SPHINCTEROTOMY: SHX5544

## 2023-10-25 HISTORY — PX: ENDOSCOPIC RETROGRADE CHOLANGIOPANCREATOGRAPHY (ERCP) WITH PROPOFOL: SHX5810

## 2023-10-25 HISTORY — PX: REMOVAL OF STONES: SHX5545

## 2023-10-25 LAB — CBC
HCT: 34.1 % — ABNORMAL LOW (ref 36.0–46.0)
Hemoglobin: 10.9 g/dL — ABNORMAL LOW (ref 12.0–15.0)
MCH: 30.7 pg (ref 26.0–34.0)
MCHC: 32 g/dL (ref 30.0–36.0)
MCV: 96.1 fL (ref 80.0–100.0)
Platelets: 428 10*3/uL — ABNORMAL HIGH (ref 150–400)
RBC: 3.55 MIL/uL — ABNORMAL LOW (ref 3.87–5.11)
RDW: 14 % (ref 11.5–15.5)
WBC: 13.9 10*3/uL — ABNORMAL HIGH (ref 4.0–10.5)
nRBC: 0 % (ref 0.0–0.2)

## 2023-10-25 LAB — COMPREHENSIVE METABOLIC PANEL
ALT: 49 U/L — ABNORMAL HIGH (ref 0–44)
AST: 29 U/L (ref 15–41)
Albumin: 2.2 g/dL — ABNORMAL LOW (ref 3.5–5.0)
Alkaline Phosphatase: 146 U/L — ABNORMAL HIGH (ref 38–126)
Anion gap: 9 (ref 5–15)
BUN: 7 mg/dL — ABNORMAL LOW (ref 8–23)
CO2: 21 mmol/L — ABNORMAL LOW (ref 22–32)
Calcium: 8.4 mg/dL — ABNORMAL LOW (ref 8.9–10.3)
Chloride: 107 mmol/L (ref 98–111)
Creatinine, Ser: 0.84 mg/dL (ref 0.44–1.00)
GFR, Estimated: >60 mL/min (ref >60)
Glucose, Bld: 132 mg/dL — ABNORMAL HIGH (ref 70–99)
Potassium: 3.6 mmol/L (ref 3.5–5.1)
Sodium: 137 mmol/L (ref 135–145)
Total Bilirubin: 0.5 mg/dL (ref <1.2)
Total Protein: 5.9 g/dL — ABNORMAL LOW (ref 6.5–8.1)

## 2023-10-25 LAB — GLUCOSE, CAPILLARY
Glucose-Capillary: 102 mg/dL — ABNORMAL HIGH (ref 70–99)
Glucose-Capillary: 105 mg/dL — ABNORMAL HIGH (ref 70–99)
Glucose-Capillary: 142 mg/dL — ABNORMAL HIGH (ref 70–99)
Glucose-Capillary: 167 mg/dL — ABNORMAL HIGH (ref 70–99)
Glucose-Capillary: 192 mg/dL — ABNORMAL HIGH (ref 70–99)
Glucose-Capillary: 95 mg/dL (ref 70–99)

## 2023-10-25 SURGERY — ENDOSCOPIC RETROGRADE CHOLANGIOPANCREATOGRAPHY (ERCP) WITH PROPOFOL
Anesthesia: General

## 2023-10-25 MED ORDER — SUGAMMADEX SODIUM 200 MG/2ML IV SOLN
INTRAVENOUS | Status: DC | PRN
  Administered 2023-10-25: 150 mg via INTRAVENOUS

## 2023-10-25 MED ORDER — INDOMETHACIN 50 MG RE SUPP: 100.0000 mg | Freq: Once | RECTAL | Status: DC

## 2023-10-25 MED ORDER — DEXAMETHASONE SODIUM PHOSPHATE 10 MG/ML IJ SOLN
INTRAMUSCULAR | Status: DC | PRN
  Administered 2023-10-25: 4 mg via INTRAVENOUS

## 2023-10-25 MED ORDER — ONDANSETRON HCL 4 MG/2ML IJ SOLN
INTRAMUSCULAR | Status: DC | PRN
  Administered 2023-10-25: 4 mg via INTRAVENOUS

## 2023-10-25 MED ORDER — DICLOFENAC SUPPOSITORY 100 MG
RECTAL | Status: AC
  Filled 2023-10-25: qty 1

## 2023-10-25 MED ORDER — PROPOFOL 10 MG/ML IV BOLUS
INTRAVENOUS | Status: DC | PRN
  Administered 2023-10-25: 60 mg via INTRAVENOUS

## 2023-10-25 MED ORDER — ROCURONIUM BROMIDE 10 MG/ML (PF) SYRINGE
PREFILLED_SYRINGE | INTRAVENOUS | Status: DC | PRN
  Administered 2023-10-25: 40 mg via INTRAVENOUS

## 2023-10-25 MED ORDER — DICLOFENAC SUPPOSITORY 100 MG
RECTAL | Status: DC | PRN
  Administered 2023-10-25: 100 mg via RECTAL

## 2023-10-25 MED ORDER — SUGAMMADEX SODIUM 200 MG/2ML IV SOLN: INTRAVENOUS | Status: DC | PRN

## 2023-10-25 MED ORDER — PHENYLEPHRINE 80 MCG/ML (10ML) SYRINGE FOR IV PUSH (FOR BLOOD PRESSURE SUPPORT)
PREFILLED_SYRINGE | INTRAVENOUS | Status: DC | PRN
  Administered 2023-10-25: 160 ug via INTRAVENOUS

## 2023-10-25 MED ORDER — LACTATED RINGERS IV SOLN
INTRAVENOUS | Status: AC | PRN
  Administered 2023-10-25: 1000 mL via INTRAVENOUS

## 2023-10-25 MED ORDER — ESMOLOL HCL 100 MG/10ML IV SOLN
INTRAVENOUS | Status: DC | PRN
  Administered 2023-10-25: 10 mg via INTRAVENOUS

## 2023-10-25 MED ORDER — PHENYLEPHRINE HCL-NACL 20-0.9 MG/250ML-% IV SOLN
INTRAVENOUS | Status: DC | PRN
  Administered 2023-10-25: 40 ug/min via INTRAVENOUS

## 2023-10-25 MED ORDER — LIDOCAINE HCL (CARDIAC) PF 100 MG/5ML IV SOSY
PREFILLED_SYRINGE | INTRAVENOUS | Status: DC | PRN
  Administered 2023-10-25: 60 mg via INTRAVENOUS

## 2023-10-25 MED ORDER — SUCCINYLCHOLINE CHLORIDE 200 MG/10ML IV SOSY
PREFILLED_SYRINGE | INTRAVENOUS | Status: DC | PRN
  Administered 2023-10-25: 80 mg via INTRAVENOUS

## 2023-10-25 MED ORDER — FENTANYL CITRATE (PF) 100 MCG/2ML IJ SOLN
INTRAMUSCULAR | Status: AC
  Filled 2023-10-25: qty 2

## 2023-10-25 MED ORDER — SODIUM CHLORIDE 0.9 % IV SOLN
INTRAVENOUS | Status: DC | PRN
  Administered 2023-10-25: 25 mL

## 2023-10-25 NOTE — Transfer of Care (Signed)
Immediate Anesthesia Transfer of Care Note  Patient: Sarah Freeman  Procedure(s) Performed: ENDOSCOPIC RETROGRADE CHOLANGIOPANCREATOGRAPHY (ERCP) WITH PROPOFOL SPHINCTEROTOMY REMOVAL OF STONES  Patient Location: Endoscopy Unit  Anesthesia Type:General  Level of Consciousness: drowsy  Airway & Oxygen Therapy: Patient Spontanous Breathing and Patient connected to face mask oxygen  Post-op Assessment: Report given to RN and Post -op Vital signs reviewed and stable  Post vital signs: Reviewed and stable  Last Vitals:  Vitals Value Taken Time  BP 138/83 10/25/23 1541  Temp 36.4 C 10/25/23 1541  Pulse 94 10/25/23 1543  Resp 16 10/25/23 1543  SpO2 100 % 10/25/23 1543  Vitals shown include unfiled device data.  Last Pain:  Vitals:   10/25/23 1541  TempSrc: Axillary  PainSc: 0-No pain      Patients Stated Pain Goal: 0 (10/24/23 2157)  Complications: No notable events documented.

## 2023-10-25 NOTE — Anesthesia Postprocedure Evaluation (Signed)
Anesthesia Post Note  Patient: Sarah Freeman  Procedure(s) Performed: ENDOSCOPIC RETROGRADE CHOLANGIOPANCREATOGRAPHY (ERCP) WITH PROPOFOL SPHINCTEROTOMY REMOVAL OF STONES     Patient location during evaluation: PACU Anesthesia Type: General Level of consciousness: awake and alert Pain management: pain level controlled Vital Signs Assessment: post-procedure vital signs reviewed and stable Respiratory status: spontaneous breathing, nonlabored ventilation, respiratory function stable and patient connected to nasal cannula oxygen Cardiovascular status: blood pressure returned to baseline and stable Postop Assessment: no apparent nausea or vomiting Anesthetic complications: no  No notable events documented.  Last Vitals:  Vitals:   10/25/23 1600 10/25/23 1610  BP: 118/71 120/73  Pulse: 88 94  Resp: 16 12  Temp:    SpO2: 98% 97%    Last Pain:  Vitals:   10/25/23 1610  TempSrc:   PainSc: 0-No pain                 Kennieth Rad

## 2023-10-25 NOTE — Op Note (Signed)
[facility] Patient Name: Sarah Freeman Procedure Date : 10/25/2023 MRN: [id number] Attending MD: Iva Boop , MD, [id number] Date of Birth: [birth year] CSN: [id number] Age: 89 Admit Type: Inpatient Procedure:                ERCP Indications:              Bile duct stone(s) Providers:                Iva Boop, MD, Jacquelyn "Jaci" Clelia Croft, RN,                            Glory Rosebush, RN, Rozetta Nunnery, Technician Referring MD:             Judithe Modest Medicines:                General Anesthesia, On continuous intermittent                            Zosyn and was given diclofenac 100 mg per rectum Complications:            No immediate complications. Estimated Blood Loss:     Estimated blood loss: none. Procedure:                Pre-Anesthesia Assessment:                           - Prior to the procedure, a History and Physical                            was performed, and patient medications and                            allergies were reviewed. The patient's tolerance of                            previous anesthesia was also reviewed. The risks                            and benefits of the procedure and the sedation                            options and risks were discussed with the patient.                            All questions were answered, and informed consent                            was obtained. Prior Anticoagulants: The patient                            last took Plavix (clopidogrel) 5 days prior to the                            procedure. ASA Grade Assessment: III - A patient  with severe systemic disease. After reviewing the                            risks and benefits, the patient was deemed in                            satisfactory condition to undergo the procedure.                           After obtaining informed consent, the scope was                            passed under direct vision.  Throughout the                            procedure, the patient's blood pressure, pulse, and                            oxygen saturations were monitored continuously. The                            [id number]-[id number] ([id number] Olympus duodenoscope was                            introduced through the mouth, and used to inject                            contrast into and used to inject contrast into the                            bile duct. The ERCP was accomplished without                            difficulty. The patient tolerated the procedure                            well. Scope In: Scope Out: Findings:      The scout film was normal. The esophagus was successfully intubated       under direct vision. The scope was advanced to a normal major papilla in       the descending duodenum without detailed examination of the pharynx,       larynx and associated structures, and upper GI tract. The upper GI tract       was grossly normal. The bile duct was deeply cannulated with the       short-nosed traction sphincterotome. Contrast was injected. I personally       interpreted the bile duct images. Contrast extended to the entire       biliary tree. The entire biliary tree was diffusely dilated, acquired.       The largest diameter was 18 mm. A cholecystectomy had been performed.       The common bile duct contained multiple stones, the largest of which was       4 mm in diameter. A 5 mm biliary sphincterotomy was made with a short  nose sphincterotome. There was no post-sphincterotomy bleeding. The       biliary tree was swept with an 18 mm balloon starting at the upper third       of the main bile duct. Multiple small stones were removed. No stones       remained. Impression:               - The entire biliary tree was dilated, acquired.                           - The patient has had a cholecystectomy.                           - Choledocholithiasis was found. Complete removal                             was accomplished by biliary sphincterotomy and                            balloon extraction.                           - A biliary sphincterotomy was performed.                           - The biliary tree was swept. Recommendation:           - return to floor                           clear liquids tonight and resume solid food                            tomorrow if ok                           defer antibiotic duration to [facility] - ok to stop                            tomorrow from my standpoint                           wait 3 days to restart clopidogrel (Plavix) - 11/30                           recheck LFT in am - they may bump up some from                            intervention FYI                           daughter and son updated by phone Procedure Code(s):        --- Professional ---                           [phone], Endoscopic retrograde  cholangiopancreatography (ERCP); with removal of                            calculi/debris from biliary/pancreatic duct(s)                           43262, Endoscopic retrograde                            cholangiopancreatography (ERCP); with                            sphincterotomy/papillotomy                           [phone], Endoscopic catheterization of the biliary                            ductal system, radiological supervision and                            interpretation Diagnosis Code(s):        --- Professional ---                           Z90.49, Acquired absence of other specified parts                            of digestive tract                           K80.50, Calculus of bile duct without cholangitis                            or cholecystitis without obstruction                           K83.8, Other specified diseases of biliary tract CPT copyright 2022 [employer]. All rights reserved. The codes documented in this report are preliminary and upon coder review may   be revised to meet current compliance requirements. Iva Boop, MD 10/25/2023 3:48:07 PM This report has been signed electronically. Number of Addenda: 0

## 2023-10-25 NOTE — Progress Notes (Addendum)
PROGRESS NOTE        PATIENT DETAILS Name: Sarah Freeman Age: 89 y.o. Sex: female Date of Birth: [birth year] Admit Date: 10/21/2023 Admitting Physician Lynnell Catalan, MD [id number], Oralia Rud, DO  Brief Summary: Patient is a 89 y.o.  female with history of CVA, dementia-who presented with septic shock in the setting of coli bacteremia either from ascending cholangitis versus UTI.  Patient was stabilized in the ICU-transferred to [facility] on 11/24.    Significant events: 11/21>> Presented to [facility] for fevers and vomiting 11/22>> Transferred to [facility] for advanced ICU/GI care 11/24>> transfer to [facility]  Significant studies: 11/21>> CT abdomen/pelvis: Intrahepatic/extrahepatic biliary ductal dilatation, moderate stool volume. 11/22>> MRCP: Intrahepatic biliary dilatation, dilatation of the common hepatic/common bile duct-1.8 cm-brought distal clinical appearing of CBD stone.  Likely layering debris or tiny gallstones.  Significant microbiology data: 11/21>> influenza/RSV/COVID PCR: Negative 11/21>> urine culture: E. coli/Proteus mirabilis 11/21>> blood culture: E. Coli 11/24>> blood culture: No growth  Procedures: None  Consults: GI PCCM  Subjective: Pleasantly confused-lying comfortably in bed.  No family at bedside-not in any distress.  Occasionally will follow simple commands.  Objective: Vitals: Blood pressure 129/64, pulse 94, temperature 97.6 F (36.4 C), temperature source Oral, resp. rate 19, weight 68.5 kg, SpO2 96%.   Exam: Gen Exam: Pleasantly confused-not in any distress HEENT:atraumatic, normocephalic Chest: B/L clear to auscultation anteriorly CVS:S1S2 regular Abdomen:soft non tender, non distended Extremities:no edema Neurology: Generalized weakness-difficult exam-left-sided weakness appears unchanged   Skin: no rash  Pertinent Labs/Radiology:    Latest Ref Rng & Units 10/25/2023    3:58 AM 10/24/2023    4:16 AM 10/23/2023    3:05  AM  CBC  WBC 4.0 - 10.5 K/uL 13.9  8.2  9.1   Hemoglobin 12.0 - 15.0 g/dL 65.7  84.6  96.2   Hematocrit 36.0 - 46.0 % 34.1  31.6  35.4   Platelets 150 - 400 K/uL 428  379  364     Lab Results  Component Value Date   NA 137 10/25/2023   K 3.6 10/25/2023   CL 107 10/25/2023   CO2 21 (L) 10/25/2023     Assessment/Plan: Septic shock secondary to E. coli bacteremia-likely due to ascending cholangitis versus complicated UTI Sepsis physiology improved BP relatively stable-midodrine being slowly tapered down Continue Unasyn ERCP with sphincterotomy scheduled for later today  Acute metabolic encephalopathy superimposed on dementia Encephalopathy due to sepsis physiology Pleasantly confused Delirium precautions Supportive care  History of CVA with residual left-sided weakness Plavix on hold for potential ERCP with sphincterotomy Statin on hold as LFTs were elevated PT/OT eval   Hypothyroidism Synthroid  Dementia Supportive care/delirium precautions Aricept/Seroquel  Physical debility/deconditioning Worsened due to acute illness PT/OT eval  Pressure Ulcer: Agree with assessment and plan Pressure Injury 10/21/23 Sacrum Mid;Upper Stage 2 -  Partial thickness loss of dermis presenting as a shallow open injury with a red, pink wound bed without slough. small reddened area with 0.5 x 0.5in shallow open area (Active)  10/21/23 2200  Location: Sacrum  Location Orientation: Mid;Upper  Staging: Stage 2 -  Partial thickness loss of dermis presenting as a shallow open injury with a red, pink wound bed without slough.  Wound Description (Comments): small reddened area with 0.5 x 0.5in shallow open area  Present on Admission: Yes  Dressing Type Foam - Lift dressing to  assess site every shift 10/25/23 0344   Obesity: Estimated body mass index is 33.86 kg/m as calculated from the following:   Height as of 06/06/23: 4\' 8"  (1.422 m).   Weight as of this encounter: 68.5 kg.   Code  status:   Code Status: Limited: Do not attempt resuscitation (DNR) -DNR-LIMITED -Do Not Intubate/DNI    DVT Prophylaxis: SCDs Start: 10/21/23 2211   Family Communication: Son at bedside on 11/25-none at bedside this morning.   Disposition Plan: Status is: Inpatient Remains inpatient appropriate because: Severity of illness   Planned Discharge Destination:Skilled nursing facility   Diet: Diet Order             Diet NPO time specified Except for: Sips with Meds  Diet effective midnight                     Antimicrobial agents: Anti-infectives (From admission, onward)    Start     Dose/Rate Route Frequency Ordered Stop   10/24/23 1800  Ampicillin-Sulbactam (UNASYN) 3 g in sodium chloride 0.9 % 100 mL IVPB        3 g 200 mL/hr over 30 Minutes Intravenous Every 8 hours 10/24/23 1218     10/21/23 2330  piperacillin-tazobactam (ZOSYN) IVPB 3.375 g  Status:  Discontinued        3.375 g 12.5 mL/hr over 240 Minutes Intravenous Every 8 hours 10/21/23 2231 10/24/23 1218        MEDICATIONS: Scheduled Meds:  Chlorhexidine Gluconate Cloth  6 each Topical Q2000   cholecalciferol  1,000 Units Oral Daily   cyanocobalamin  1,000 mcg Oral Daily   donepezil  5 mg Oral QHS   gabapentin  200 mg Oral q morning   levothyroxine  50 mcg Oral Q0600   midodrine  2.5 mg Oral BID WC   mirtazapine  7.5 mg Oral QHS   mouth rinse  15 mL Mouth Rinse 4 times per day   pantoprazole  40 mg Oral Daily   polyethylene glycol  17 g Oral Daily   QUEtiapine  25 mg Oral QPM   Continuous Infusions:  ampicillin-sulbactam (UNASYN) IV Stopped (10/25/23 0141)   PRN Meds:.acetaminophen, docusate sodium, lip balm, mouth rinse, polyethylene glycol   I have personally reviewed following labs and imaging studies  LABORATORY DATA: CBC: Recent Labs  Lab 10/20/23 2044 10/21/23 0504 10/22/23 0258 10/23/23 0305 10/24/23 0416 10/25/23 0358  WBC 20.0* 17.7* 9.0 9.1 8.2 13.9*  NEUTROABS 14.8*  --    --  3.9  --   --   HGB 12.0 10.0* 9.3* 11.5* 10.2* 10.9*  HCT 37.0 29.8* 27.5* 35.4* 31.6* 34.1*  MCV 97.4 94.0 95.5 95.2 97.2 96.1  PLT 475* 383 318 364 379 428*    Basic Metabolic Panel: Recent Labs  Lab 10/21/23 0504 10/22/23 0258 10/23/23 0305 10/24/23 0416 10/25/23 0358  NA 136 138 139 137 137  K 3.5 3.0* 4.2 3.7 3.6  CL 103 108 108 109 107  CO2 24 24 21* 22 21*  GLUCOSE 169* 73 90 79 132*  BUN 10 5* <5* 7* 7*  CREATININE 0.76 0.78 0.75 0.94 0.84  CALCIUM 8.1* 7.8* 8.6* 8.1* 8.4*  MG 1.9 1.8  --   --   --   PHOS 3.5 3.6  --   --   --     GFR: Estimated Creatinine Clearance: 34.6 mL/min (by C-G formula based on SCr of 0.84 mg/dL).  Liver Function Tests: Recent Labs  Lab  10/21/23 0504 10/22/23 0258 10/23/23 0305 10/24/23 0416 10/25/23 0358  AST 357* 168* 106* 45* 29  ALT 139* 97* 91* 56* 49*  ALKPHOS 284* 178* 206* 151* 146*  BILITOT 1.3* 1.6* 1.0 0.5 0.5  PROT 5.7* 4.8* 6.1* 5.2* 5.9*  ALBUMIN 2.4* 2.0* 2.3* 2.0* 2.2*   No results for input(s): "LIPASE", "AMYLASE" in the last 168 hours. No results for input(s): "AMMONIA" in the last 168 hours.  Coagulation Profile: Recent Labs  Lab 10/20/23 2044 10/21/23 0504  INR 1.1 1.3*    Cardiac Enzymes: No results for input(s): "CKTOTAL", "CKMB", "CKMBINDEX", "TROPONINI" in the last 168 hours.  BNP (last 3 results) No results for input(s): "PROBNP" in the last 8760 hours.  Lipid Profile: No results for input(s): "CHOL", "HDL", "LDLCALC", "TRIG", "CHOLHDL", "LDLDIRECT" in the last 72 hours.  Thyroid Function Tests: No results for input(s): "TSH", "T4TOTAL", "FREET4", "T3FREE", "THYROIDAB" in the last 72 hours.  Anemia Panel: No results for input(s): "VITAMINB12", "FOLATE", "FERRITIN", "TIBC", "IRON", "RETICCTPCT" in the last 72 hours.  Urine analysis:    Component Value Date/Time   COLORURINE AMBER (A) 10/20/2023 2044   APPEARANCEUR TURBID (A) 10/20/2023 2044   APPEARANCEUR Clear 01/11/2023 1029    LABSPEC 1.010 10/20/2023 2044   PHURINE 7.0 10/20/2023 2044   GLUCOSEU NEGATIVE 10/20/2023 2044   HGBUR SMALL (A) 10/20/2023 2044   BILIRUBINUR NEGATIVE 10/20/2023 2044   BILIRUBINUR Negative 01/11/2023 1029   KETONESUR NEGATIVE 10/20/2023 2044   PROTEINUR NEGATIVE 10/20/2023 2044   NITRITE POSITIVE (A) 10/20/2023 2044   LEUKOCYTESUR LARGE (A) 10/20/2023 2044    Sepsis Labs: Lactic Acid, Venous    Component Value Date/Time   LATICACIDVEN 1.7 10/21/2023 0504    MICROBIOLOGY: Recent Results (from the past 240 hour(s))  Culture, blood (Routine x 2)     Status: Abnormal   Collection Time: 10/20/23  8:44 PM   Specimen: BLOOD  Result Value Ref Range Status   Specimen Description   Final    BLOOD BLOOD RIGHT ARM Performed at [facility], [address] [place], [place] [address]    Special Requests   Final    BOTTLES DRAWN AEROBIC AND ANAEROBIC Blood Culture adequate volume Performed at [facility], [address] [place], [place] [address]    Culture  Setup Time NO ORGANISMS SEEN ANAEROBIC BOTTLE ONLY   Final   Culture (A)  Final    ESCHERICHIA COLI SUSCEPTIBILITIES PERFORMED ON PREVIOUS CULTURE WITHIN THE LAST 5 DAYS. CRITICAL VALUE NOTED.  VALUE IS CONSISTENT WITH PREVIOUSLY REPORTED AND CALLED VALUE. Performed at [facility], [address] [place], [place] [address]    Report Status 10/23/2023 FINAL  Final  Culture, blood (Routine x 2)     Status: Abnormal   Collection Time: 10/20/23  8:44 PM   Specimen: BLOOD LEFT ARM  Result Value Ref Range Status   Specimen Description   Final    BLOOD LEFT ARM Performed at [facility], [address] [place], [place] [address]    Special Requests   Final    BOTTLES DRAWN AEROBIC AND ANAEROBIC Blood Culture results may not be optimal due to an excessive volume of blood received in culture bottles Performed at [facility], [address] [place], [place] [address]    Culture   Setup Time   Final    GRAM NEGATIVE RODS IN BOTH AEROBIC AND ANAEROBIC BOTTLES CRITICAL RESULT CALLED TO, READ BACK BY AND VERIFIED WITH: Mila Merry  AT 1120 10/21/23.PMF Performed at [facility], [address] [place], [place] [address]    Culture ESCHERICHIA COLI (A)  Final   Report Status 10/23/2023 FINAL  Final   Organism ID, Bacteria ESCHERICHIA COLI  Final   Organism ID, Bacteria ESCHERICHIA COLI  Final      Susceptibility   Escherichia coli - MIC*    AMPICILLIN 4 SENSITIVE Sensitive     CEFEPIME <=0.12 SENSITIVE Sensitive     CEFTAZIDIME <=1 SENSITIVE Sensitive     CEFTRIAXONE <=0.25 SENSITIVE Sensitive     CIPROFLOXACIN >=4 RESISTANT Resistant     GENTAMICIN <=1 SENSITIVE Sensitive     IMIPENEM <=0.25 SENSITIVE Sensitive     TRIMETH/SULFA <=20 SENSITIVE Sensitive     AMPICILLIN/SULBACTAM <=2 SENSITIVE Sensitive     PIP/TAZO <=4 SENSITIVE Sensitive ug/mL   Escherichia coli - KIRBY BAUER*    CEFAZOLIN SENSITIVE Sensitive     * ESCHERICHIA COLI    ESCHERICHIA COLI  Resp panel by RT-PCR (RSV, Flu A&B, Covid) In/Out Cath Urine     Status: None   Collection Time: 10/20/23  8:44 PM   Specimen: In/Out Cath Urine; Nasal Swab  Result Value Ref Range Status   SARS Coronavirus 2 by RT PCR NEGATIVE NEGATIVE Final    Comment: (NOTE) SARS-CoV-2 target nucleic acids are NOT DETECTED.  The SARS-CoV-2 RNA is generally detectable in upper respiratory specimens during the acute phase of infection. The lowest concentration of SARS-CoV-2 viral copies this assay can detect is 138 copies/mL. A negative result does not preclude SARS-Cov-2 infection and should not be used as the sole basis for treatment or other patient management decisions. A negative result may occur with  improper specimen collection/handling, submission of specimen other than nasopharyngeal swab, presence of viral mutation(s) within the areas targeted by this assay, and inadequate number of viral copies(<138  copies/mL). A negative result must be combined with clinical observations, patient history, and epidemiological information. The expected result is Negative.  Fact Sheet for Patients:  BloggerCourse.com  Fact Sheet for Healthcare Providers:  SeriousBroker.it  This test is no t yet approved or cleared by the [place] FDA and  has been authorized for detection and/or diagnosis of SARS-CoV-2 by FDA under an Emergency Use Authorization (EUA). This EUA will remain  in effect (meaning this test can be used) for the duration of the COVID-19 declaration under Section 564(b)(1) of the Act, 21 U.S.C.section 360bbb-3(b)(1), unless the authorization is terminated  or revoked sooner.       Influenza A by PCR NEGATIVE NEGATIVE Final   Influenza B by PCR NEGATIVE NEGATIVE Final    Comment: (NOTE) The Xpert Xpress SARS-CoV-2/FLU/RSV plus assay is intended as an aid in the diagnosis of influenza from Nasopharyngeal swab specimens and should not be used as a sole basis for treatment. Nasal washings and aspirates are unacceptable for Xpert Xpress SARS-CoV-2/FLU/RSV testing.  Fact Sheet for Patients: BloggerCourse.com  Fact Sheet for Healthcare Providers: SeriousBroker.it  This test is not yet approved or cleared by the [place] FDA and has been authorized for detection and/or diagnosis of SARS-CoV-2 by FDA under an Emergency Use Authorization (EUA). This EUA will remain in effect (meaning this test can be used) for the duration of the COVID-19 declaration under Section 564(b)(1) of the Act, 21 U.S.C. section 360bbb-3(b)(1), unless the authorization is terminated or revoked.     Resp Syncytial Virus by PCR NEGATIVE NEGATIVE Final    Comment: (NOTE) Fact Sheet for Patients: BloggerCourse.com  Fact Sheet for Healthcare  Providers: SeriousBroker.it  This test is not yet approved or cleared by the [place] FDA and has been authorized for detection and/or diagnosis of SARS-CoV-2 by FDA under an Emergency Use Authorization (EUA). This EUA will remain in effect (meaning this test can be used) for the duration of the COVID-19 declaration under Section 564(b)(1) of the Act, 21 U.S.C. section 360bbb-3(b)(1), unless the authorization is terminated or revoked.  Performed at [facility], [address], [place], [place] [address]   Urine Culture     Status: Abnormal   Collection Time: 10/20/23  8:44 PM   Specimen: Urine, Catheterized  Result Value Ref Range Status   Specimen Description   Final    URINE, CATHETERIZED Performed at [facility], [address], [place], [place] [address]    Special Requests   Final    NONE Reflexed from [id number] Performed at [facility], [address] [place], [place] [address]    Culture (A)  Final    >=100,000 COLONIES/mL ESCHERICHIA COLI 30,000 COLONIES/mL PROTEUS MIRABILIS    Report Status 10/23/2023 FINAL  Final   Organism ID, Bacteria ESCHERICHIA COLI (A)  Final   Organism ID, Bacteria PROTEUS MIRABILIS (A)  Final      Susceptibility   Escherichia coli - MIC*    AMPICILLIN 4 SENSITIVE Sensitive     CEFAZOLIN <=4 SENSITIVE Sensitive     CEFEPIME <=0.12 SENSITIVE Sensitive     CEFTRIAXONE <=0.25 SENSITIVE Sensitive     CIPROFLOXACIN >=4 RESISTANT Resistant     GENTAMICIN <=1 SENSITIVE Sensitive     IMIPENEM <=0.25 SENSITIVE Sensitive     NITROFURANTOIN 128 RESISTANT Resistant     TRIMETH/SULFA <=20 SENSITIVE Sensitive     AMPICILLIN/SULBACTAM <=2 SENSITIVE Sensitive     PIP/TAZO <=4 SENSITIVE Sensitive ug/mL    * >=100,000 COLONIES/mL ESCHERICHIA COLI   Proteus mirabilis - MIC*    AMPICILLIN <=2 SENSITIVE Sensitive     CEFAZOLIN <=4 SENSITIVE Sensitive     CEFEPIME <=0.12 SENSITIVE Sensitive      CEFTRIAXONE <=0.25 SENSITIVE Sensitive     CIPROFLOXACIN <=0.25 SENSITIVE Sensitive     GENTAMICIN <=1 SENSITIVE Sensitive     IMIPENEM 2 SENSITIVE Sensitive     NITROFURANTOIN 128 RESISTANT Resistant     TRIMETH/SULFA <=20 SENSITIVE Sensitive     AMPICILLIN/SULBACTAM <=2 SENSITIVE Sensitive     PIP/TAZO <=4 SENSITIVE Sensitive ug/mL    * 30,000 COLONIES/mL PROTEUS MIRABILIS  Blood Culture ID Panel (Reflexed)     Status: Abnormal   Collection Time: 10/20/23  8:44 PM  Result Value Ref Range Status   Enterococcus faecalis NOT DETECTED NOT DETECTED Final   Enterococcus Faecium NOT DETECTED NOT DETECTED Final   Listeria monocytogenes NOT DETECTED NOT DETECTED Final   Staphylococcus species NOT DETECTED NOT DETECTED Final   Staphylococcus aureus (BCID) NOT DETECTED NOT DETECTED Final   Staphylococcus epidermidis NOT DETECTED NOT DETECTED Final   Staphylococcus lugdunensis NOT DETECTED NOT DETECTED Final   Streptococcus species NOT DETECTED NOT DETECTED Final   Streptococcus agalactiae NOT DETECTED NOT DETECTED Final   Streptococcus pneumoniae NOT DETECTED NOT DETECTED Final   Streptococcus pyogenes NOT DETECTED NOT DETECTED Final   A.calcoaceticus-baumannii NOT DETECTED NOT DETECTED Final   Bacteroides fragilis NOT DETECTED NOT DETECTED Final   Enterobacterales DETECTED (A) NOT DETECTED Final    Comment: Enterobacterales represent a large order of gram negative bacteria, not a single organism. CRITICAL RESULT CALLED TO, READ  BACK BY AND VERIFIED WITH: WALID NAZARI AT 1120 10/21/23.PMF    Enterobacter cloacae complex NOT DETECTED NOT DETECTED Final   Escherichia coli DETECTED (A) NOT DETECTED Final    Comment: CRITICAL RESULT CALLED TO, READ BACK BY AND VERIFIED WITH: WALID NAZARI AT 1120 10/21/23.PMF    Klebsiella aerogenes NOT DETECTED NOT DETECTED Final   Klebsiella oxytoca NOT DETECTED NOT DETECTED Final   Klebsiella pneumoniae NOT DETECTED NOT DETECTED Final   Proteus species NOT  DETECTED NOT DETECTED Final   Salmonella species NOT DETECTED NOT DETECTED Final   Serratia marcescens NOT DETECTED NOT DETECTED Final   Haemophilus influenzae NOT DETECTED NOT DETECTED Final   Neisseria meningitidis NOT DETECTED NOT DETECTED Final   Pseudomonas aeruginosa NOT DETECTED NOT DETECTED Final   Stenotrophomonas maltophilia NOT DETECTED NOT DETECTED Final   Candida albicans NOT DETECTED NOT DETECTED Final   Candida auris NOT DETECTED NOT DETECTED Final   Candida glabrata NOT DETECTED NOT DETECTED Final   Candida krusei NOT DETECTED NOT DETECTED Final   Candida parapsilosis NOT DETECTED NOT DETECTED Final   Candida tropicalis NOT DETECTED NOT DETECTED Final   Cryptococcus neoformans/gattii NOT DETECTED NOT DETECTED Final   CTX-M ESBL NOT DETECTED NOT DETECTED Final   Carbapenem resistance IMP NOT DETECTED NOT DETECTED Final   Carbapenem resistance KPC NOT DETECTED NOT DETECTED Final   Carbapenem resistance NDM NOT DETECTED NOT DETECTED Final   Carbapenem resist OXA 48 LIKE NOT DETECTED NOT DETECTED Final   Carbapenem resistance VIM NOT DETECTED NOT DETECTED Final    Comment: Performed at [facility], [address] [place], [place] [address]  MRSA Next Gen by PCR, Nasal     Status: None   Collection Time: 10/21/23  2:13 AM   Specimen: Nasal Mucosa; Nasal Swab  Result Value Ref Range Status   MRSA by PCR Next Gen NOT DETECTED NOT DETECTED Final    Comment: (NOTE) The GeneXpert MRSA Assay (FDA approved for NASAL specimens only), is one component of a comprehensive MRSA colonization surveillance program. It is not intended to diagnose MRSA infection nor to guide or monitor treatment for MRSA infections. Test performance is not FDA approved in patients less than 36 years old. Performed at [facility], [address] [place], [place] [address]   Culture, blood (Routine X 2) w Reflex to ID Panel     Status: None (Preliminary result)   Collection Time:  10/23/23  9:47 AM   Specimen: BLOOD LEFT ARM  Result Value Ref Range Status   Specimen Description BLOOD LEFT ARM  Final   Special Requests   Final    BOTTLES DRAWN AEROBIC AND ANAEROBIC Blood Culture results may not be optimal due to an excessive volume of blood received in culture bottles   Culture   Final    NO GROWTH < 24 HOURS Performed at [facility], [address], [place], [place] [address]    Report Status PENDING  Incomplete  Culture, blood (Routine X 2) w Reflex to ID Panel     Status: None (Preliminary result)   Collection Time: 10/23/23  9:57 AM   Specimen: BLOOD RIGHT HAND  Result Value Ref Range Status   Specimen Description BLOOD RIGHT HAND  Final   Special Requests   Final    BOTTLES DRAWN AEROBIC ONLY Blood Culture adequate volume   Culture   Final    NO GROWTH < 24 HOURS Performed at [facility], [address], [place], [place] [address]    Report Status PENDING  Incomplete    RADIOLOGY STUDIES/RESULTS: No results found.   LOS: 4 days   Jeoffrey Massed, MD  [facility]    To contact the attending provider between 7A-7P or the covering provider during after hours 7P-7A, please log into the web site [email or web address] and access using universal [place] password for that web site. If you do not have the password, please call the hospital operator.  10/25/2023, 8:50 AM

## 2023-10-25 NOTE — Progress Notes (Signed)
Informed by RN that family concerned that patient's facial features/speech is slightly different than her baseline. Patient was subsequently evaluated bedside She is awake-speech is slow but mostly clear. No obvious facial droop-she has some chronic left-sided weakness at baseline that seems unchanged Left upper extremity is at baseline-only some side-to-side movement Left lower extremity is at baseline-able to withdraw to pain Both right upper/right lower extremity-at baseline  Discussed at length with patient's daughter/son at bedside-patient had sedation for ERCP earlier-has advanced dementia-Plavix has been held for ERCP.  Both understand that she is at risk of CVA-however exam is not very convincing.  This very well could just be delirium from infectious issues-dementia and sedation that she had for ERCP.  Family is very clear-that she would not want aggressive care-after extensive discussion-including discussing neuroimaging to rule out hemorrhagic CVA etc. family would at this time prefer not to proceed with any further testing-and would just want to be re-evaluated tomorrow morning.  Given her advanced age-medical comorbidities-neurological exam-I think this is a reasonable strategy.  Family is well aware that if in the unlikely event she worsens overnight-apart from proceeding initiating comfort measures-really no other options-Family is okay with this.  CT head that had been ordered previously has been canceled.

## 2023-10-25 NOTE — Interval H&P Note (Signed)
History and Physical Interval Note:  10/25/2023 2:41 PM  Sarah Freeman  has presented today for surgery, with the diagnosis of choledocholithiasis.  The various methods of treatment have been discussed with the patient and family. After consideration of risks, benefits and other options for treatment, the patient has consented to  Procedure(s): ENDOSCOPIC RETROGRADE CHOLANGIOPANCREATOGRAPHY (ERCP) WITH PROPOFOL (N/A) as a surgical intervention.  The patient's history has been reviewed, patient examined, no change in status, stable for surgery.  I have reviewed the patient's chart and labs.  Questions were answered to the patient's satisfaction.     Stan Head

## 2023-10-25 NOTE — Anesthesia Procedure Notes (Signed)
Procedure Name: Intubation Date/Time: 10/25/2023 2:55 PM  Performed by: Yolonda Kida, CRNAPre-anesthesia Checklist: Patient identified, Emergency Drugs available, Suction available and Patient being monitored Patient Re-evaluated:Patient Re-evaluated prior to induction Oxygen Delivery Method: Circle System Utilized Preoxygenation: Pre-oxygenation with 100% oxygen Induction Type: IV induction Ventilation: Mask ventilation without difficulty Laryngoscope Size: Mac and 3 Grade View: Grade I Tube type: Oral Tube size: 7.0 mm Number of attempts: 1 Airway Equipment and Method: Stylet Placement Confirmation: ETT inserted through vocal cords under direct vision, positive ETCO2 and breath sounds checked- equal and bilateral Secured at: 21 cm Tube secured with: Tape Dental Injury: Teeth and Oropharynx as per pre-operative assessment

## 2023-10-25 NOTE — Anesthesia Preprocedure Evaluation (Addendum)
Anesthesia Evaluation  Patient identified by MRN, date of birth, ID band Patient awake and Patient confused    Reviewed: Allergy & Precautions, NPO status , Patient's Chart, lab work & pertinent test results  Airway Mallampati: II  TM Distance: >3 FB Neck ROM: Full    Dental  (+) Dental Advisory Given   Pulmonary neg pulmonary ROS   breath sounds clear to auscultation       Cardiovascular hypertension,  Rhythm:Regular Rate:Normal   1. Left ventricular ejection fraction, by estimation, is 60 to 65%. The  left ventricle has normal function. The left ventricle has no regional  wall motion abnormalities. Left ventricular diastolic parameters are  consistent with Grade I diastolic  dysfunction (impaired relaxation).   2. Right ventricular systolic function is normal. The right ventricular  size is normal.   3. The mitral valve is normal in structure. Mild mitral valve  regurgitation. No evidence of mitral stenosis.   4. The aortic valve is tricuspid. Aortic valve regurgitation is not  visualized. No aortic stenosis is present.   5. The inferior vena cava is normal in size with greater than 50%  respiratory variability, suggesting right atrial pressure of 3 mmHg.     Neuro/Psych TIACVA    GI/Hepatic ,GERD  Medicated,,  Endo/Other  Hypothyroidism  Lab Results      Component                Value               Date                      HGBA1C                   5.8 (H)             10/21/2023             Renal/GU Renal diseaseLab Results      Component                Value               Date                      NA                       137                 10/25/2023                K                        3.6                 10/25/2023                CO2                      21 (L)              10/25/2023                GLUCOSE                  132 (H)             10/25/2023  BUN                      7 (L)                10/25/2023                CREATININE               0.84                10/25/2023                CALCIUM                  8.4 (L)             10/25/2023                EGFR                     57 (L)              05/17/2023                GFRNONAA                 >60                 10/25/2023                Musculoskeletal   Abdominal   Peds  Hematology  (+) Blood dyscrasia, anemia Lab Results      Component                Value               Date                      WBC                      13.9 (H)            10/25/2023                HGB                      10.9 (L)            10/25/2023                HCT                      34.1 (L)            10/25/2023                MCV                      96.1                10/25/2023                PLT                      428 (H)             10/25/2023              Anesthesia Other Findings   Reproductive/Obstetrics  Anesthesia Physical Anesthesia Plan  ASA: 3  Anesthesia Plan: General   Post-op Pain Management: Minimal or no pain anticipated   Induction: Intravenous  PONV Risk Score and Plan: 3 and Dexamethasone and Ondansetron  Airway Management Planned: Oral ETT  Additional Equipment: None  Intra-op Plan:   Post-operative Plan: Extubation in OR  Informed Consent: I have reviewed the patients History and Physical, chart, labs and discussed the procedure including the risks, benefits and alternatives for the proposed anesthesia with the patient or authorized representative who has indicated his/her understanding and acceptance.   Patient has DNR.  Discussed DNR with power of attorney and Continue DNR.   Dental advisory given and Consent reviewed with POA  Plan Discussed with: CRNA  Anesthesia Plan Comments:         Anesthesia Quick Evaluation

## 2023-10-25 NOTE — Plan of Care (Signed)
  Problem: Nutrition: Goal: Adequate nutrition will be maintained Outcome: Progressing   Problem: Safety: Goal: Ability to remain free from injury will improve Outcome: Progressing   Problem: Skin Integrity: Goal: Risk for impaired skin integrity will decrease Outcome: Progressing   

## 2023-10-25 NOTE — Progress Notes (Signed)
PT Cancellation Note  Patient Details Name: MACI PALOS MRN: [id number] DOB: [birth year]   Cancelled Treatment:    Reason Eval/Treat Not Completed: Patient at procedure or test/unavailable (Pt off the floor at endoscopy. Will follow up tomorrow.)   Gladys Damme 10/25/2023, 2:05 PM

## 2023-10-26 DIAGNOSIS — R7989 Other specified abnormal findings of blood chemistry: Secondary | ICD-10-CM | POA: Diagnosis not present

## 2023-10-26 DIAGNOSIS — E039 Hypothyroidism, unspecified: Secondary | ICD-10-CM | POA: Diagnosis not present

## 2023-10-26 DIAGNOSIS — K838 Other specified diseases of biliary tract: Secondary | ICD-10-CM

## 2023-10-26 DIAGNOSIS — K8309 Other cholangitis: Secondary | ICD-10-CM

## 2023-10-26 DIAGNOSIS — A419 Sepsis, unspecified organism: Secondary | ICD-10-CM | POA: Diagnosis not present

## 2023-10-26 DIAGNOSIS — N390 Urinary tract infection, site not specified: Secondary | ICD-10-CM | POA: Diagnosis not present

## 2023-10-26 DIAGNOSIS — R6521 Severe sepsis with septic shock: Secondary | ICD-10-CM | POA: Diagnosis not present

## 2023-10-26 DIAGNOSIS — B9620 Unspecified Escherichia coli [E. coli] as the cause of diseases classified elsewhere: Secondary | ICD-10-CM | POA: Diagnosis not present

## 2023-10-26 LAB — GLUCOSE, CAPILLARY
Glucose-Capillary: 100 mg/dL — ABNORMAL HIGH (ref 70–99)
Glucose-Capillary: 106 mg/dL — ABNORMAL HIGH (ref 70–99)
Glucose-Capillary: 130 mg/dL — ABNORMAL HIGH (ref 70–99)
Glucose-Capillary: 145 mg/dL — ABNORMAL HIGH (ref 70–99)
Glucose-Capillary: 149 mg/dL — ABNORMAL HIGH (ref 70–99)

## 2023-10-26 LAB — COMPREHENSIVE METABOLIC PANEL
ALT: 38 U/L (ref 0–44)
AST: 27 U/L (ref 15–41)
Albumin: 2.3 g/dL — ABNORMAL LOW (ref 3.5–5.0)
Alkaline Phosphatase: 137 U/L — ABNORMAL HIGH (ref 38–126)
Anion gap: 10 (ref 5–15)
BUN: 8 mg/dL (ref 8–23)
CO2: 21 mmol/L — ABNORMAL LOW (ref 22–32)
Calcium: 8.7 mg/dL — ABNORMAL LOW (ref 8.9–10.3)
Chloride: 108 mmol/L (ref 98–111)
Creatinine, Ser: 0.88 mg/dL (ref 0.44–1.00)
GFR, Estimated: >60 mL/min (ref >60)
Glucose, Bld: 146 mg/dL — ABNORMAL HIGH (ref 70–99)
Potassium: 4.1 mmol/L (ref 3.5–5.1)
Sodium: 139 mmol/L (ref 135–145)
Total Bilirubin: 0.6 mg/dL (ref <1.2)
Total Protein: 6.1 g/dL — ABNORMAL LOW (ref 6.5–8.1)

## 2023-10-26 LAB — CBC
HCT: 39.3 % (ref 36.0–46.0)
Hemoglobin: 12.6 g/dL (ref 12.0–15.0)
MCH: 30.7 pg (ref 26.0–34.0)
MCHC: 32.1 g/dL (ref 30.0–36.0)
MCV: 95.9 fL (ref 80.0–100.0)
Platelets: 459 10*3/uL — ABNORMAL HIGH (ref 150–400)
RBC: 4.1 MIL/uL (ref 3.87–5.11)
RDW: 14 % (ref 11.5–15.5)
WBC: 12.8 10*3/uL — ABNORMAL HIGH (ref 4.0–10.5)
nRBC: 0 % (ref 0.0–0.2)

## 2023-10-26 MED ORDER — CLOPIDOGREL BISULFATE 75 MG PO TABS: 75.0000 mg | ORAL_TABLET | Freq: Every day | ORAL | Status: DC

## 2023-10-26 MED ORDER — NITROFURANTOIN MONOHYD MACRO 100 MG PO CAPS: 100.0000 mg | ORAL_CAPSULE | Freq: Every day | ORAL | Status: DC

## 2023-10-26 MED ORDER — AMOXICILLIN-POT CLAVULANATE 875-125 MG PO TABS: 1.0000 | ORAL_TABLET | Freq: Two times a day (BID) | ORAL | Status: AC

## 2023-10-26 NOTE — Discharge Summary (Signed)
PATIENT DETAILS Name: Sarah Freeman Age: 89 y.o. Sex: female Date of Birth: Nov 30, 1930 MRN: [id number]. Admitting Physician: Lynnell Catalan, MD [id number], Oralia Rud, DO  Admit Date: 10/21/2023 Discharge date: 10/27/2023  Recommendations for Outpatient Follow-up:  Follow up with PCP in 1-2 weeks Please obtain CMP/CBC in one week  Admitted From:  SNF  Disposition: Skilled nursing facility   Discharge Condition: good  CODE STATUS:   Code Status: Limited: Do not attempt resuscitation (DNR) -DNR-LIMITED -Do Not Intubate/DNI    Diet recommendation:  Diet Order             DIET DYS 3 Room service appropriate? Yes; Fluid consistency: Thin  Diet effective now           Diet - low sodium heart healthy                    Brief Summary: Patient is a 89 y.o.  female with history of CVA, dementia-who presented with septic shock in the setting of coli bacteremia either from ascending cholangitis versus UTI.  Patient was stabilized in the ICU-transferred to [facility] on 11/24.     Significant events: 11/21>> Presented to [facility] for fevers and vomiting 11/22>> Transferred to [facility] for advanced ICU/GI care 11/24>> transfer to [facility]   Significant studies: 11/21>> CT abdomen/pelvis: Intrahepatic/extrahepatic biliary ductal dilatation, moderate stool volume. 11/22>> MRCP: Intrahepatic biliary dilatation, dilatation of the common hepatic/common bile duct-1.8 cm-brought distal clinical appearing of CBD stone.  Likely layering debris or tiny gallstones.   Significant microbiology data: 11/21>> influenza/RSV/COVID PCR: Negative 11/21>> urine culture: E. coli/Proteus mirabilis 11/21>> blood culture: E. Coli 11/24>> blood culture: No growth   Procedures: 11/26>> ERCP   Consults: GI PCCM  Brief Hospital Course: Septic shock secondary to E. coli bacteremia-likely due to ascending cholangitis versus complicated UTI Sepsis physiology resolved with broad-spectrum IV antibiotics S/p  ERCP on 11/26 No major issues overnight- tolerated advancement in diet Will be transition to Augmentin for a few more days on discharge.  Acute metabolic encephalopathy superimposed on dementia Encephalopathy due to sepsis physiology Pleasantly confused-back to baseline this morning. On 11/26-she became a bit more confused than usual but this morning-she is back to her baseline.  We had contemplated doing neuroimaging but family wanted to hold off-since he is back to her baseline-no role for neuroimaging.  Suspect her confusion on 11/26 was due to sedation post ERCP.   History of CVA with residual left-sided weakness Plavix held for ERCP-per GI-resume on 11/30.  Resume statin on discharge-briefly held for elevated transaminases.   Hypothyroidism Synthroid   Dementia Supportive care/delirium precautions Aricept/Seroquel   Physical debility/deconditioning Worsened due to acute illness PT/OT eval-back to SNF  Pressure Ulcer: Agree with assessment and plan Pressure Injury 10/21/23 Sacrum Mid;Upper Stage 2 -  Partial thickness loss of dermis presenting as a shallow open injury with a red, pink wound bed without slough. small reddened area with 0.5 x 0.5in shallow open area (Active)  10/21/23 2200  Location: Sacrum  Location Orientation: Mid;Upper  Staging: Stage 2 -  Partial thickness loss of dermis presenting as a shallow open injury with a red, pink wound bed without slough.  Wound Description (Comments): small reddened area with 0.5 x 0.5in shallow open area  Present on Admission: Yes  Dressing Type Foam - Lift dressing to assess site every shift 10/26/23 2000   Obesity: Estimated body mass index is 33.86 kg/m as calculated from the following:   Height as of 06/06/23: 4'  8" (1.422 m).   Weight as of this encounter: 68.5 kg.    Discharge Diagnoses:  Principal Problem:   Septic shock (HCC) Active Problems:   Hypothyroidism   Urinary tract infection   Elevated LFTs   E coli  bacteremia   Calculus of gallbladder with biliary obstruction but without cholecystitis   Choledocholithiasis   Discharge Instructions:  Activity:  As tolerated with Full fall precautions use walker/cane & assistance as needed  Discharge Instructions     Call MD for:  extreme fatigue   Complete by: As directed    Call MD for:  persistant dizziness or light-headedness   Complete by: As directed    Call MD for:  persistant nausea and vomiting   Complete by: As directed    Diet - low sodium heart healthy   Complete by: As directed    Discharge instructions   Complete by: As directed    Follow with Primary MD  Olevia Perches P, DO in 1-2 weeks  Please get a complete blood count and chemistry panel checked by your Primary MD at your next visit, and again as instructed by your Primary MD.  Get Medicines reviewed and adjusted: Please take all your medications with you for your next visit with your Primary MD  Laboratory/radiological data: Please request your Primary MD to go over all hospital tests and procedure/radiological results at the follow up, please ask your Primary MD to get all Hospital records sent to his/her office.  In some cases, they will be blood work, cultures and biopsy results pending at the time of your discharge. Please request that your primary care M.D. follows up on these results.  Also Note the following: If you experience worsening of your admission symptoms, develop shortness of breath, life threatening emergency, suicidal or homicidal thoughts you must seek medical attention immediately by calling 911 or calling your MD immediately  if symptoms less severe.  You must read complete instructions/literature along with all the possible adverse reactions/side effects for all the Medicines you take and that have been prescribed to you. Take any new Medicines after you have completely understood and accpet all the possible adverse reactions/side effects.   Do not  drive when taking Pain medications or sleeping medications (Benzodaizepines)  Do not take more than prescribed Pain, Sleep and Anxiety Medications. It is not advisable to combine anxiety,sleep and pain medications without talking with your primary care practitioner  Special Instructions: If you have smoked or chewed Tobacco  in the last 2 yrs please stop smoking, stop any regular Alcohol  and or any Recreational drug use.  Wear Seat belts while driving.  Please note: You were cared for by a hospitalist during your hospital stay. Once you are discharged, your primary care physician will handle any further medical issues. Please note that NO REFILLS for any discharge medications will be authorized once you are discharged, as it is imperative that you return to your primary care physician (or establish a relationship with a primary care physician if you do not have one) for your post hospital discharge needs so that they can reassess your need for medications and monitor your lab values.   Increase activity slowly   Complete by: As directed    No wound care   Complete by: As directed       Allergies as of 10/27/2023       Reactions   Statins Other (See Comments)   Leg cramps   Bacitracin Other (See Comments)  Doesn't help wound to heal   Neomycin-polymyxin-gramicidin Other (See Comments)   Pt states wounds do not heal with neosporin        Medication List     TAKE these medications    alendronate 70 MG/75ML solution Commonly known as: FOSAMAX Take 70 mg by mouth every 7 (seven) days.   amoxicillin-clavulanate 875-125 MG tablet Commonly known as: AUGMENTIN Take 1 tablet by mouth 2 (two) times daily for 3 days.   aspirin EC 81 MG tablet Take 81 mg by mouth daily. Swallow whole.   cholecalciferol 25 MCG (1000 UNIT) tablet Commonly known as: VITAMIN D3 Take 1,000 Units by mouth daily.   clopidogrel 75 MG tablet Commonly known as: PLAVIX Take 1 tablet (75 mg total) by  mouth daily. Start taking on: October 29, 2023 What changed: These instructions start on October 29, 2023. If you are unsure what to do until then, ask your doctor or other care provider.   cyanocobalamin 1000 MCG tablet Take 1 tablet (1,000 mcg total) by mouth daily.   cyclobenzaprine 5 MG tablet Commonly known as: FLEXERIL Take 5 mg by mouth 3 (three) times daily as needed.   donepezil 5 MG tablet Commonly known as: ARICEPT Take 1 tablet (5 mg total) by mouth at bedtime. What changed: when to take this   gabapentin 100 MG capsule Commonly known as: NEURONTIN TAKE 2 CAPSULES BY MOUTH EVERY MORNING AND 1 CAPSULE AT 1 PM   levothyroxine 50 MCG tablet Commonly known as: SYNTHROID Take 1 tablet (50 mcg total) by mouth daily before breakfast.   mirtazapine 7.5 MG tablet Commonly known as: REMERON Take 7.5 mg by mouth at bedtime.   nitrofurantoin (macrocrystal-monohydrate) 100 MG capsule Commonly known as: Macrobid Take 1 capsule (100 mg total) by mouth at bedtime. Start taking on: October 30, 2023 What changed: These instructions start on October 30, 2023. If you are unsure what to do until then, ask your doctor or other care provider.   pantoprazole 40 MG tablet Commonly known as: PROTONIX Take 1 tablet (40 mg total) by mouth daily.   polyethylene glycol 17 g packet Commonly known as: MIRALAX / GLYCOLAX Take 17 g by mouth daily.   Prolia 60 MG/ML Sosy injection Generic drug: denosumab Inject 60 mg into the skin every 6 (six) months.   QUEtiapine 25 MG tablet Commonly known as: SEROQUEL Take 0.5-1 tablets (12.5-25 mg total) by mouth every evening. What changed: how much to take   simvastatin 20 MG tablet Commonly known as: ZOCOR Take 1 tablet (20 mg total) by mouth daily at 6 PM. What changed: when to take this        Follow-up Information     Johnson, Megan P, DO. Schedule an appointment as soon as possible for a visit in 1 week(s).   Specialty: Family  Medicine Contact information: [address] [place] [address] [phone]                Allergies  Allergen Reactions   Statins Other (See Comments)    Leg cramps    Bacitracin Other (See Comments)    Doesn't help wound to heal   Neomycin-Polymyxin-Gramicidin Other (See Comments)    Pt states wounds do not heal with neosporin      Other Procedures/Studies: DG ERCP  Result Date: 10/26/2023 CLINICAL DATA:  Choledocholithiasis EXAM: ERCP TECHNIQUE: Multiple spot images obtained with the fluoroscopic device and submitted for interpretation post-procedure. FLUOROSCOPY: Radiation Exposure Index (as provided by the  fluoroscopic device): 19 mGy Kerma COMPARISON:  MRI abdomen 10/21/2023 FINDINGS: 8 intraoperative fluoroscopic images were submitted for interpretation. The submitted images demonstrate cannulation and opacification of the intra or extrahepatic bile duct. Dilatation of the common bile duct is again noted. Image 4 demonstrates a balloon within the CBD. No filling defects identified within the superior half of the common bile duct on the final image. There is incomplete opacification of the inferior half of the common bile duct. IMPRESSION: ERCP images demonstrating balloon sweep of dilated CBD. These images were submitted for radiologic interpretation only. Please see the procedural report for the amount of contrast and the fluoroscopy time utilized. Electronically Signed   By: Acquanetta Belling M.D.   On: 10/26/2023 08:14   MR ABDOMEN MRCP W WO CONTAST  Result Date: 10/21/2023 CLINICAL DATA:  Abdominal pain with biliary dilatation EXAM: MRI ABDOMEN WITHOUT AND WITH CONTRAST (INCLUDING MRCP) TECHNIQUE: Multiplanar multisequence MR imaging of the abdomen was performed both before and after the administration of intravenous contrast. Heavily T2-weighted images of the biliary and pancreatic ducts were obtained, and three-dimensional MRCP images were rendered by post processing.  Post-processing was applied at the acquisition scanner with concurrent physician supervision which includes 3D reconstructions, MIPs, volume rendered images and/or shaded surface rendering. CONTRAST:  6mL GADAVIST GADOBUTROL 1 MMOL/ML IV SOLN COMPARISON:  10/20/2023 FINDINGS: Despite efforts by the technologist and patient, prominent motion artifact is present on today's exam and could not be eliminated. This reduces exam sensitivity and specificity. This is a common result when MRCP is attempted in the inpatient setting where patients are less likely to be able to breath hold and cooperate in controlling motion. Lower chest: Trace right pleural effusion. Suspected mild dependent airspace opacity in the right lower lobe based on coronal images. Mild cardiomegaly. Trace left pleural effusion. Hepatobiliary: Mild intrahepatic biliary dilatation with substantial fusiform dilation of the common hepatic duct and common bile duct, CBD caliber about 1.8 cm on image 8 series 10. Slightly abrupt distal conical tapering of the CBD is shown on image 9 series 10. No significant abnormal enhancement of the wall of the extrahepatic biliary tree. Subtle irregularity/thickening along the posterior wall of the common bile duct on image 20 through 21 of series 4 raise the possibility of a trace amount of layering debris or conceivably tiny gallstones although these are not well seen individually or in a definitive manner. There is no abrupt truncation of the distal CBD at the ampulla. No discrete ampullary mass. No discrete hepatic parenchymal lesion. Pancreas:  Unremarkable Spleen:  Unremarkable Adrenals/Urinary Tract: 6 mm Bosniak category 1 cyst of the right kidney upper pole. No further imaging workup of this lesion is indicated. Otherwise unremarkable. Stomach/Bowel: Sigmoid colon diverticulosis. Scattered diverticula the descending colon. Vascular/Lymphatic: Abdominal aortic atherosclerosis. As better shown on prior CT,  atheromatous plaque narrows the origin of the celiac artery and likewise causes substantial narrowing proximally in the superior mesenteric artery, without overt occlusion. Other: Trace ascites along the inferior right hepatic lobe margin. Some of this also extends along the porta hepatis. Musculoskeletal: Sarcopenia. Substantial lumbar spondylosis and degenerative disc disease. IMPRESSION: 1. Mild intrahepatic biliary dilatation with substantial fusiform dilation of the common hepatic duct and common bile duct, CBD caliber about 1.8 cm. Slightly abrupt distal conical tapering of the CBD is shown. No discrete ampullary mass is identified. Subtle irregularity/thickening along the posterior wall of the common bile duct raise the possibility of a trace amount of layering debris or conceivably tiny gallstones  although these are not well seen individually or in a definitive manner. The extrahepatic biliary dilatation could be a physiologic response to prior cholecystectomy, sphincter of Oddi dysfunction/stricture, or type 1a choledochal cyst. 2. Trace ascites along the inferior right hepatic lobe margin and porta hepatis. 3. Trace right pleural effusion with suspected mild dependent airspace opacity in the right lower lobe, atelectasis versus pneumonia. 4. Mild cardiomegaly. 5. Sigmoid colon diverticulosis. 6. Aortic atherosclerosis. As better shown on prior CT, atheromatous plaque narrows the origin of the celiac artery and likewise causes substantial narrowing proximally in the superior mesenteric artery, without overt occlusion. 7. Sarcopenia. 8. Substantial lumbar spondylosis and degenerative disc disease. 9. Despite efforts by the technologist and patient, motion artifact is present on today's exam and could not be eliminated. This reduces exam sensitivity and specificity. Electronically Signed   By: Gaylyn Rong M.D.   On: 10/21/2023 16:04   MR 3D Recon At Scanner  Result Date: 10/21/2023 CLINICAL DATA:   Abdominal pain with biliary dilatation EXAM: MRI ABDOMEN WITHOUT AND WITH CONTRAST (INCLUDING MRCP) TECHNIQUE: Multiplanar multisequence MR imaging of the abdomen was performed both before and after the administration of intravenous contrast. Heavily T2-weighted images of the biliary and pancreatic ducts were obtained, and three-dimensional MRCP images were rendered by post processing. Post-processing was applied at the acquisition scanner with concurrent physician supervision which includes 3D reconstructions, MIPs, volume rendered images and/or shaded surface rendering. CONTRAST:  6mL GADAVIST GADOBUTROL 1 MMOL/ML IV SOLN COMPARISON:  10/20/2023 FINDINGS: Despite efforts by the technologist and patient, prominent motion artifact is present on today's exam and could not be eliminated. This reduces exam sensitivity and specificity. This is a common result when MRCP is attempted in the inpatient setting where patients are less likely to be able to breath hold and cooperate in controlling motion. Lower chest: Trace right pleural effusion. Suspected mild dependent airspace opacity in the right lower lobe based on coronal images. Mild cardiomegaly. Trace left pleural effusion. Hepatobiliary: Mild intrahepatic biliary dilatation with substantial fusiform dilation of the common hepatic duct and common bile duct, CBD caliber about 1.8 cm on image 8 series 10. Slightly abrupt distal conical tapering of the CBD is shown on image 9 series 10. No significant abnormal enhancement of the wall of the extrahepatic biliary tree. Subtle irregularity/thickening along the posterior wall of the common bile duct on image 20 through 21 of series 4 raise the possibility of a trace amount of layering debris or conceivably tiny gallstones although these are not well seen individually or in a definitive manner. There is no abrupt truncation of the distal CBD at the ampulla. No discrete ampullary mass. No discrete hepatic parenchymal lesion.  Pancreas:  Unremarkable Spleen:  Unremarkable Adrenals/Urinary Tract: 6 mm Bosniak category 1 cyst of the right kidney upper pole. No further imaging workup of this lesion is indicated. Otherwise unremarkable. Stomach/Bowel: Sigmoid colon diverticulosis. Scattered diverticula the descending colon. Vascular/Lymphatic: Abdominal aortic atherosclerosis. As better shown on prior CT, atheromatous plaque narrows the origin of the celiac artery and likewise causes substantial narrowing proximally in the superior mesenteric artery, without overt occlusion. Other: Trace ascites along the inferior right hepatic lobe margin. Some of this also extends along the porta hepatis. Musculoskeletal: Sarcopenia. Substantial lumbar spondylosis and degenerative disc disease. IMPRESSION: 1. Mild intrahepatic biliary dilatation with substantial fusiform dilation of the common hepatic duct and common bile duct, CBD caliber about 1.8 cm. Slightly abrupt distal conical tapering of the CBD is shown. No discrete ampullary mass  is identified. Subtle irregularity/thickening along the posterior wall of the common bile duct raise the possibility of a trace amount of layering debris or conceivably tiny gallstones although these are not well seen individually or in a definitive manner. The extrahepatic biliary dilatation could be a physiologic response to prior cholecystectomy, sphincter of Oddi dysfunction/stricture, or type 1a choledochal cyst. 2. Trace ascites along the inferior right hepatic lobe margin and porta hepatis. 3. Trace right pleural effusion with suspected mild dependent airspace opacity in the right lower lobe, atelectasis versus pneumonia. 4. Mild cardiomegaly. 5. Sigmoid colon diverticulosis. 6. Aortic atherosclerosis. As better shown on prior CT, atheromatous plaque narrows the origin of the celiac artery and likewise causes substantial narrowing proximally in the superior mesenteric artery, without overt occlusion. 7. Sarcopenia.  8. Substantial lumbar spondylosis and degenerative disc disease. 9. Despite efforts by the technologist and patient, motion artifact is present on today's exam and could not be eliminated. This reduces exam sensitivity and specificity. Electronically Signed   By: Gaylyn Rong M.D.   On: 10/21/2023 16:04   CT ABDOMEN PELVIS W CONTRAST  Result Date: 10/20/2023 CLINICAL DATA:  89 year old, provided history of sepsis. Fever with vomiting. EXAM: CT ABDOMEN AND PELVIS WITH CONTRAST TECHNIQUE: Multidetector CT imaging of the abdomen and pelvis was performed using the standard protocol following bolus administration of intravenous contrast. RADIATION DOSE REDUCTION: This exam was performed according to the departmental dose-optimization program which includes automated exposure control, adjustment of the mA and/or kV according to patient size and/or use of iterative reconstruction technique. CONTRAST:  OMNIPAQUE IOHEXOL 300 MG/ML  SOLN COMPARISON:  None Available. FINDINGS: Lower chest: Scattered atelectasis and chronic lung disease with subpleural reticulation. Coronary artery calcifications. Hepatobiliary: Patient is post cholecystectomy. There is intra and extrahepatic biliary ductal dilatation. The common bile duct measures 15 mm at the porta hepatis and tapers distally. No visible choledocholithiasis. No suspicious hepatic lesion. Pancreas: Parenchymal atrophy. No ductal dilatation or inflammation. Spleen: Normal in size without focal abnormality. Adrenals/Urinary Tract: No adrenal nodule. Prominence of the renal collecting systems but no hydronephrosis. Symmetric renal enhancement and excretion on delayed phase imaging. No renal calculi or suspicious renal abnormality. Physiologically distended urinary bladder, no bladder wall thickening. Stomach/Bowel: Motion artifact limits assessment. Small hiatal hernia with fluid in the distal esophagus. Fluid within the stomach without gastric inflammation or  wall thickening. No small bowel obstruction or inflammation. The appendix is not definitively seen. Moderate volume of stool throughout the colon. There is colonic redundancy. Colonic diverticulosis without focal diverticulitis. There is moderate stool distention of the rectum, rectal distention of 7.2 cm. Mild rectal wall thickening and perirectal edema. Vascular/Lymphatic: Aortic atherosclerosis without aneurysm. No acute vascular findings. No abdominopelvic adenopathy. Reproductive: Status post hysterectomy. No adnexal masses. Other: No free air or ascites. Musculoskeletal: Scoliosis and degenerative change in the lumbar spine. There are chronic calcifications in the left gluteus medius muscle posterior to the left acetabulum. IMPRESSION: 1. Moderate stool distention of the rectum with mild rectal wall thickening and perirectal edema, suspicious for stercoral colitis. 2. Moderate volume of stool throughout the colon, constipation suspected. Colonic diverticulosis without diverticulitis. 3. Intra and extrahepatic biliary ductal dilatation, likely due to post cholecystectomy state. Recommend correlation with liver function tests. If LFTs are normal, no further workup is recommended. If LFTs are elevated, MRCP is the workup of choice, however not recommended in this patient who cannot hold still for CT. 4. Small hiatal hernia with fluid in the distal esophagus, can be seen  with gastroesophageal reflux. Aortic Atherosclerosis (ICD10-I70.0). Electronically Signed   By: Narda Rutherford M.D.   On: 10/20/2023 22:35   DG Chest 1 View  Result Date: 10/20/2023 CLINICAL DATA:  Fever, vomiting EXAM: CHEST  1 VIEW COMPARISON:  12/21/2018 FINDINGS: Single frontal view of the chest demonstrates an unremarkable cardiac silhouette. Lung volumes are diminished, without airspace disease, effusion, or pneumothorax. No acute bony abnormalities. IMPRESSION: 1. Low lung volumes.  No acute process. Electronically Signed   By:  Sharlet Salina M.D.   On: 10/20/2023 21:16     TODAY-DAY OF DISCHARGE:  Subjective:   Cybele Antos today remains little confused-back to baseline per son at bedside  Objective:   Blood pressure 122/74, pulse 75, temperature 97.8 F (36.6 C), temperature source Axillary, resp. rate 14, weight 68.5 kg, SpO2 96%.  Intake/Output Summary (Last 24 hours) at 10/27/2023 0831 Last data filed at 10/27/2023 0500 Gross per 24 hour  Intake --  Output 400 ml  Net -400 ml   Filed Weights   10/23/23 0500 10/24/23 0500 10/25/23 0249  Weight: 70.9 kg 68.9 kg 68.5 kg    Exam: Awake Alert, Oriented *3, No new F.N deficits, Normal affect [place].AT,PERRAL Supple Neck,No JVD, No cervical lymphadenopathy appriciated.  Symmetrical Chest wall movement, Good air movement bilaterally, CTAB RRR,No Gallops,Rubs or new Murmurs, No Parasternal Heave +ve B.Sounds, Abd Soft, Non tender, No organomegaly appriciated, No rebound -guarding or rigidity. No Cyanosis, Clubbing or edema, No new Rash or bruise   PERTINENT RADIOLOGIC STUDIES: DG ERCP  Result Date: 10/26/2023 CLINICAL DATA:  Choledocholithiasis EXAM: ERCP TECHNIQUE: Multiple spot images obtained with the fluoroscopic device and submitted for interpretation post-procedure. FLUOROSCOPY: Radiation Exposure Index (as provided by the fluoroscopic device): 19 mGy Kerma COMPARISON:  MRI abdomen 10/21/2023 FINDINGS: 8 intraoperative fluoroscopic images were submitted for interpretation. The submitted images demonstrate cannulation and opacification of the intra or extrahepatic bile duct. Dilatation of the common bile duct is again noted. Image 4 demonstrates a balloon within the CBD. No filling defects identified within the superior half of the common bile duct on the final image. There is incomplete opacification of the inferior half of the common bile duct. IMPRESSION: ERCP images demonstrating balloon sweep of dilated CBD. These images were submitted for  radiologic interpretation only. Please see the procedural report for the amount of contrast and the fluoroscopy time utilized. Electronically Signed   By: Acquanetta Belling M.D.   On: 10/26/2023 08:14     PERTINENT LAB RESULTS: CBC: Recent Labs    10/25/23 0358 10/26/23 0445  WBC 13.9* 12.8*  HGB 10.9* 12.6  HCT 34.1* 39.3  PLT 428* 459*   CMET CMP     Component Value Date/Time   NA 139 10/26/2023 0445   NA 136 05/17/2023 1524   K 4.1 10/26/2023 0445   CL 108 10/26/2023 0445   CO2 21 (L) 10/26/2023 0445   GLUCOSE 146 (H) 10/26/2023 0445   BUN 8 10/26/2023 0445   BUN 8 (L) 05/17/2023 1524   CREATININE 0.88 10/26/2023 0445   CALCIUM 8.7 (L) 10/26/2023 0445   PROT 6.1 (L) 10/26/2023 0445   PROT 7.1 05/17/2023 1524   ALBUMIN 2.3 (L) 10/26/2023 0445   ALBUMIN 3.9 05/17/2023 1524   AST 27 10/26/2023 0445   ALT 38 10/26/2023 0445   ALKPHOS 137 (H) 10/26/2023 0445   BILITOT 0.6 10/26/2023 0445   BILITOT 0.3 05/17/2023 1524   EGFR 57 (L) 05/17/2023 1524   GFRNONAA >60 10/26/2023 0445  GFR Estimated Creatinine Clearance: 33 mL/min (by C-G formula based on SCr of 0.88 mg/dL). No results for input(s): "LIPASE", "AMYLASE" in the last 72 hours. No results for input(s): "CKTOTAL", "CKMB", "CKMBINDEX", "TROPONINI" in the last 72 hours. Invalid input(s): "POCBNP" No results for input(s): "DDIMER" in the last 72 hours. No results for input(s): "HGBA1C" in the last 72 hours. No results for input(s): "CHOL", "HDL", "LDLCALC", "TRIG", "CHOLHDL", "LDLDIRECT" in the last 72 hours. No results for input(s): "TSH", "T4TOTAL", "T3FREE", "THYROIDAB" in the last 72 hours.  Invalid input(s): "FREET3" No results for input(s): "VITAMINB12", "FOLATE", "FERRITIN", "TIBC", "IRON", "RETICCTPCT" in the last 72 hours. Coags: No results for input(s): "INR" in the last 72 hours.  Invalid input(s): "PT" Microbiology: Recent Results (from the past 240 hour(s))  Culture, blood (Routine x 2)     Status:  Abnormal   Collection Time: 10/20/23  8:44 PM   Specimen: BLOOD  Result Value Ref Range Status   Specimen Description   Final    BLOOD BLOOD RIGHT ARM Performed at [facility], [address] [place], [place] [address]    Special Requests   Final    BOTTLES DRAWN AEROBIC AND ANAEROBIC Blood Culture adequate volume Performed at [facility], [address] [place], [place] [address]    Culture  Setup Time NO ORGANISMS SEEN ANAEROBIC BOTTLE ONLY   Final   Culture (A)  Final    ESCHERICHIA COLI SUSCEPTIBILITIES PERFORMED ON PREVIOUS CULTURE WITHIN THE LAST 5 DAYS. CRITICAL VALUE NOTED.  VALUE IS CONSISTENT WITH PREVIOUSLY REPORTED AND CALLED VALUE. Performed at [facility], [address], [place], [place] [address]    Report Status 10/23/2023 FINAL  Final  Culture, blood (Routine x 2)     Status: Abnormal   Collection Time: 10/20/23  8:44 PM   Specimen: BLOOD LEFT ARM  Result Value Ref Range Status   Specimen Description   Final    BLOOD LEFT ARM Performed at [facility], [address], [place], [place] [address]    Special Requests   Final    BOTTLES DRAWN AEROBIC AND ANAEROBIC Blood Culture results may not be optimal due to an excessive volume of blood received in culture bottles Performed at [facility], [address] [place], [place] [address]    Culture  Setup Time   Final    GRAM NEGATIVE RODS IN BOTH AEROBIC AND ANAEROBIC BOTTLES CRITICAL RESULT CALLED TO, READ BACK BY AND VERIFIED WITH: [facility] NAZARI AT 1120 10/21/23.PMF Performed at [facility], [address] [place], [place] [address]    Culture ESCHERICHIA COLI (A)  Final   Report Status 10/23/2023 FINAL  Final   Organism ID, Bacteria ESCHERICHIA COLI  Final   Organism ID, Bacteria ESCHERICHIA COLI  Final      Susceptibility   Escherichia coli - MIC*    AMPICILLIN 4 SENSITIVE Sensitive     CEFEPIME <=0.12 SENSITIVE Sensitive     CEFTAZIDIME <=1  SENSITIVE Sensitive     CEFTRIAXONE <=0.25 SENSITIVE Sensitive     CIPROFLOXACIN >=4 RESISTANT Resistant     GENTAMICIN <=1 SENSITIVE Sensitive     IMIPENEM <=0.25 SENSITIVE Sensitive     TRIMETH/SULFA <=20 SENSITIVE Sensitive     AMPICILLIN/SULBACTAM <=2 SENSITIVE Sensitive     PIP/TAZO <=4 SENSITIVE Sensitive ug/mL   Escherichia coli - KIRBY BAUER*    CEFAZOLIN SENSITIVE Sensitive     * ESCHERICHIA COLI    ESCHERICHIA COLI  Resp panel by RT-PCR (RSV, Flu A&B, Covid) In/Out Cath Urine     Status: None   Collection Time: 10/20/23  8:44 PM   Specimen: In/Out Cath Urine; Nasal Swab  Result Value Ref Range Status   SARS Coronavirus 2 by RT PCR NEGATIVE NEGATIVE Final    Comment: (NOTE) SARS-CoV-2 target nucleic acids are NOT DETECTED.  The SARS-CoV-2 RNA is generally detectable in upper respiratory specimens during the acute phase of infection. The lowest concentration of SARS-CoV-2 viral copies this assay can detect is 138 copies/mL. A negative result does not preclude SARS-Cov-2 infection and should not be used as the sole basis for treatment or other patient management decisions. A negative result may occur with  improper specimen collection/handling, submission of specimen other than nasopharyngeal swab, presence of viral mutation(s) within the areas targeted by this assay, and inadequate number of viral copies(<138 copies/mL). A negative result must be combined with clinical observations, patient history, and epidemiological information. The expected result is Negative.  Fact Sheet for Patients:  BloggerCourse.com  Fact Sheet for Healthcare Providers:  SeriousBroker.it  This test is no t yet approved or cleared by the [place] FDA and  has been authorized for detection and/or diagnosis of SARS-CoV-2 by FDA under an Emergency Use Authorization (EUA). This EUA will remain  in effect (meaning this test can be used) for the  duration of the COVID-19 declaration under Section 564(b)(1) of the Act, 21 U.S.C.section 360bbb-3(b)(1), unless the authorization is terminated  or revoked sooner.       Influenza A by PCR NEGATIVE NEGATIVE Final   Influenza B by PCR NEGATIVE NEGATIVE Final    Comment: (NOTE) The Xpert Xpress SARS-CoV-2/FLU/RSV plus assay is intended as an aid in the diagnosis of influenza from Nasopharyngeal swab specimens and should not be used as a sole basis for treatment. Nasal washings and aspirates are unacceptable for Xpert Xpress SARS-CoV-2/FLU/RSV testing.  Fact Sheet for Patients: BloggerCourse.com  Fact Sheet for Healthcare Providers: SeriousBroker.it  This test is not yet approved or cleared by the [place] FDA and has been authorized for detection and/or diagnosis of SARS-CoV-2 by FDA under an Emergency Use Authorization (EUA). This EUA will remain in effect (meaning this test can be used) for the duration of the COVID-19 declaration under Section 564(b)(1) of the Act, 21 U.S.C. section 360bbb-3(b)(1), unless the authorization is terminated or revoked.     Resp Syncytial Virus by PCR NEGATIVE NEGATIVE Final    Comment: (NOTE) Fact Sheet for Patients: BloggerCourse.com  Fact Sheet for Healthcare Providers: SeriousBroker.it  This test is not yet approved or cleared by the [place] FDA and has been authorized for detection and/or diagnosis of SARS-CoV-2 by FDA under an Emergency Use Authorization (EUA). This EUA will remain in effect (meaning this test can be used) for the duration of the COVID-19 declaration under Section 564(b)(1) of the Act, 21 U.S.C. section 360bbb-3(b)(1), unless the authorization is terminated or revoked.  Performed at [facility], [address] [place], [place] [address]   Urine Culture     Status: Abnormal   Collection Time:  10/20/23  8:44 PM   Specimen: Urine, Catheterized  Result Value Ref Range Status   Specimen Description   Final    URINE, CATHETERIZED Performed at [facility], [address], [place], [place] [address]    Special Requests   Final    NONE Reflexed from [phone] Performed at [facility], [address] [place], [place]  [address]    Culture (A)  Final    >=100,000 COLONIES/mL ESCHERICHIA COLI 30,000 COLONIES/mL PROTEUS MIRABILIS    Report Status 10/23/2023 FINAL  Final   Organism ID, Bacteria ESCHERICHIA COLI (A)  Final   Organism ID, Bacteria PROTEUS MIRABILIS (A)  Final      Susceptibility   Escherichia coli - MIC*    AMPICILLIN 4 SENSITIVE Sensitive     CEFAZOLIN <=4 SENSITIVE Sensitive     CEFEPIME <=0.12 SENSITIVE Sensitive     CEFTRIAXONE <=0.25 SENSITIVE Sensitive     CIPROFLOXACIN >=4 RESISTANT Resistant     GENTAMICIN <=1 SENSITIVE Sensitive     IMIPENEM <=0.25 SENSITIVE Sensitive     NITROFURANTOIN 128 RESISTANT Resistant     TRIMETH/SULFA <=20 SENSITIVE Sensitive     AMPICILLIN/SULBACTAM <=2 SENSITIVE Sensitive     PIP/TAZO <=4 SENSITIVE Sensitive ug/mL    * >=100,000 COLONIES/mL ESCHERICHIA COLI   Proteus mirabilis - MIC*    AMPICILLIN <=2 SENSITIVE Sensitive     CEFAZOLIN <=4 SENSITIVE Sensitive     CEFEPIME <=0.12 SENSITIVE Sensitive     CEFTRIAXONE <=0.25 SENSITIVE Sensitive     CIPROFLOXACIN <=0.25 SENSITIVE Sensitive     GENTAMICIN <=1 SENSITIVE Sensitive     IMIPENEM 2 SENSITIVE Sensitive     NITROFURANTOIN 128 RESISTANT Resistant     TRIMETH/SULFA <=20 SENSITIVE Sensitive     AMPICILLIN/SULBACTAM <=2 SENSITIVE Sensitive     PIP/TAZO <=4 SENSITIVE Sensitive ug/mL    * 30,000 COLONIES/mL PROTEUS MIRABILIS  Blood Culture ID Panel (Reflexed)     Status: Abnormal   Collection Time: 10/20/23  8:44 PM  Result Value Ref Range Status   Enterococcus faecalis NOT DETECTED NOT DETECTED Final   Enterococcus Faecium NOT DETECTED NOT DETECTED Final    Listeria monocytogenes NOT DETECTED NOT DETECTED Final   Staphylococcus species NOT DETECTED NOT DETECTED Final   Staphylococcus aureus (BCID) NOT DETECTED NOT DETECTED Final   Staphylococcus epidermidis NOT DETECTED NOT DETECTED Final   Staphylococcus lugdunensis NOT DETECTED NOT DETECTED Final   Streptococcus species NOT DETECTED NOT DETECTED Final   Streptococcus agalactiae NOT DETECTED NOT DETECTED Final   Streptococcus pneumoniae NOT DETECTED NOT DETECTED Final   Streptococcus pyogenes NOT DETECTED NOT DETECTED Final   A.calcoaceticus-baumannii NOT DETECTED NOT DETECTED Final   Bacteroides fragilis NOT DETECTED NOT DETECTED Final   Enterobacterales DETECTED (A) NOT DETECTED Final    Comment: Enterobacterales represent a large order of gram negative bacteria, not a single organism. CRITICAL RESULT CALLED TO, READ BACK BY AND VERIFIED WITH: WALID NAZARI AT 1120 10/21/23.PMF    Enterobacter cloacae complex NOT DETECTED NOT DETECTED Final   Escherichia coli DETECTED (A) NOT DETECTED Final    Comment: CRITICAL RESULT CALLED TO, READ BACK BY AND VERIFIED WITH: WALID NAZARI AT 1120 10/21/23.PMF    Klebsiella aerogenes NOT DETECTED NOT DETECTED Final   Klebsiella oxytoca NOT DETECTED NOT DETECTED Final   Klebsiella pneumoniae NOT DETECTED NOT DETECTED Final   Proteus species NOT DETECTED NOT DETECTED Final   Salmonella species NOT DETECTED NOT DETECTED Final   Serratia marcescens NOT DETECTED NOT DETECTED Final   Haemophilus influenzae NOT DETECTED NOT DETECTED Final   Neisseria meningitidis NOT DETECTED NOT DETECTED Final   Pseudomonas aeruginosa NOT DETECTED NOT DETECTED Final   Stenotrophomonas maltophilia NOT DETECTED NOT DETECTED Final   Candida albicans NOT DETECTED NOT DETECTED Final   Candida auris NOT DETECTED NOT DETECTED Final   Candida glabrata NOT DETECTED NOT DETECTED Final  Candida krusei NOT DETECTED NOT DETECTED Final   Candida parapsilosis NOT DETECTED NOT  DETECTED Final   Candida tropicalis NOT DETECTED NOT DETECTED Final   Cryptococcus neoformans/gattii NOT DETECTED NOT DETECTED Final   CTX-M ESBL NOT DETECTED NOT DETECTED Final   Carbapenem resistance IMP NOT DETECTED NOT DETECTED Final   Carbapenem resistance KPC NOT DETECTED NOT DETECTED Final   Carbapenem resistance NDM NOT DETECTED NOT DETECTED Final   Carbapenem resist OXA 48 LIKE NOT DETECTED NOT DETECTED Final   Carbapenem resistance VIM NOT DETECTED NOT DETECTED Final    Comment: Performed at [facility], [address] [place], [place] [address]  MRSA Next Gen by PCR, Nasal     Status: None   Collection Time: 10/21/23  2:13 AM   Specimen: Nasal Mucosa; Nasal Swab  Result Value Ref Range Status   MRSA by PCR Next Gen NOT DETECTED NOT DETECTED Final    Comment: (NOTE) The GeneXpert MRSA Assay (FDA approved for NASAL specimens only), is one component of a comprehensive MRSA colonization surveillance program. It is not intended to diagnose MRSA infection nor to guide or monitor treatment for MRSA infections. Test performance is not FDA approved in patients less than 20 years old. Performed at [facility], [address] [place], [place] [address]   Culture, blood (Routine X 2) w Reflex to ID Panel     Status: None (Preliminary result)   Collection Time: 10/23/23  9:47 AM   Specimen: BLOOD LEFT ARM  Result Value Ref Range Status   Specimen Description BLOOD LEFT ARM  Final   Special Requests   Final    BOTTLES DRAWN AEROBIC AND ANAEROBIC Blood Culture results may not be optimal due to an excessive volume of blood received in culture bottles   Culture   Final    NO GROWTH 4 DAYS Performed at [facility], [address] [place], [place] [address]    Report Status PENDING  Incomplete  Culture, blood (Routine X 2) w Reflex to ID Panel     Status: None (Preliminary result)   Collection Time: 10/23/23  9:57 AM   Specimen: BLOOD RIGHT HAND  Result  Value Ref Range Status   Specimen Description BLOOD RIGHT HAND  Final   Special Requests   Final    BOTTLES DRAWN AEROBIC ONLY Blood Culture adequate volume   Culture   Final    NO GROWTH 4 DAYS Performed at [facility], [address] [place], [place] [address]    Report Status PENDING  Incomplete    FURTHER DISCHARGE INSTRUCTIONS:  Get Medicines reviewed and adjusted: Please take all your medications with you for your next visit with your Primary MD  Laboratory/radiological data: Please request your Primary MD to go over all hospital tests and procedure/radiological results at the follow up, please ask your Primary MD to get all Hospital records sent to his/her office.  In some cases, they will be blood work, cultures and biopsy results pending at the time of your discharge. Please request that your primary care M.D. goes through all the records of your hospital data and follows up on these results.  Also Note the following: If you experience worsening of your admission symptoms, develop shortness of breath, life threatening emergency, suicidal or homicidal thoughts you must seek medical attention immediately by calling 911 or calling your MD immediately  if symptoms less severe.  You must read complete instructions/literature along with all the possible  adverse reactions/side effects for all the Medicines you take and that have been prescribed to you. Take any new Medicines after you have completely understood and accpet all the possible adverse reactions/side effects.   Do not drive when taking Pain medications or sleeping medications (Benzodaizepines)  Do not take more than prescribed Pain, Sleep and Anxiety Medications. It is not advisable to combine anxiety,sleep and pain medications without talking with your primary care practitioner  Special Instructions: If you have smoked or chewed Tobacco  in the last 2 yrs please stop smoking, stop any regular Alcohol  and or any  Recreational drug use.  Wear Seat belts while driving.  Please note: You were cared for by a hospitalist during your hospital stay. Once you are discharged, your primary care physician will handle any further medical issues. Please note that NO REFILLS for any discharge medications will be authorized once you are discharged, as it is imperative that you return to your primary care physician (or establish a relationship with a primary care physician if you do not have one) for your post hospital discharge needs so that they can reassess your need for medications and monitor your lab values.  Total Time spent coordinating discharge including counseling, education and face to face time equals greater than 30 minutes.  SignedJeoffrey Massed 10/27/2023 8:31 AM

## 2023-10-26 NOTE — TOC Progression Note (Signed)
Transition of Care [facility]) - Progression Note    Patient Details  Name: Sarah Freeman MRN: [id number] Date of Birth: 1931-05-13  Transition of Care [facility]) CM/SW Contact  Khyan Oats Reeves Forth, Student-Social Work Phone Number: 10/26/2023, 11:49 AM  Clinical Narrative:    MSW Intern called pt daughter to inform her of her mother returning to [facility] via [facility] this afternoon.   Expected Discharge Plan: Skilled Nursing Facility Barriers to Discharge: Continued Medical Work up  Expected Discharge Plan and Services In-house Referral: Clinical Social Work       Expected Discharge Date: 10/26/23                                     Social Determinants of Health (SDOH) Interventions SDOH Screenings   Food Insecurity: No Food Insecurity (10/24/2023)  Housing: Low Risk  (10/24/2023)  Transportation Needs: No Transportation Needs (10/24/2023)  Utilities: Not At Risk (10/24/2023)  Alcohol Screen: Low Risk  (02/01/2023)  Depression (PHQ2-9): Low Risk  (05/17/2023)  Financial Resource Strain: Low Risk  (02/01/2023)  Physical Activity: Insufficiently Active (02/01/2023)  Social Connections: Moderately Isolated (02/01/2023)  Stress: No Stress Concern Present (02/01/2023)  Tobacco Use: Low Risk  (10/25/2023)    Readmission Risk Interventions    10/21/2023    1:46 PM  Readmission Risk Prevention Plan  Transportation Screening Complete  PCP or Specialist Appt within 3-5 Days Complete  HRI or Home Care Consult Complete  Social Work Consult for Recovery Care Planning/Counseling Complete  Palliative Care Screening Complete  Medication Review Oceanographer) Complete

## 2023-10-26 NOTE — Progress Notes (Signed)
PT Cancellation Note  Patient Details Name: Sarah Freeman MRN: [id number] DOB: 07-02-1931   Cancelled Treatment:    Reason Eval/Treat Not Completed: PT screened, no needs identified, will sign off (Per chart review, pt is from SNF and using a hoyer at baseline. No acute PT needs identified. Will sign off.)   Gladys Damme 10/26/2023, 10:57 AM

## 2023-10-26 NOTE — TOC Progression Note (Signed)
Transition of Care [facility]) - Progression Note    Patient Details  Name: Sarah Freeman MRN: [id number] Date of Birth: June 04, 1931  Transition of Care [facility]) CM/SW Contact  Mearl Latin, LCSW Phone Number: 10/26/2023, 4:54 PM  Clinical Narrative:    Per MD, patient has not urinated yet so may hold discharge today. CSW confirmed that [facility] has already obtained medications for patient so they are able to take her tomorrow if needed. RN to call their RN to provide report at [phone] tomorrow.    Expected Discharge Plan: Skilled Nursing Facility Barriers to Discharge: Barriers Resolved  Expected Discharge Plan and Services In-house Referral: Clinical Social Work       Expected Discharge Date: 10/26/23                                     Social Determinants of Health (SDOH) Interventions SDOH Screenings   Food Insecurity: No Food Insecurity (10/24/2023)  Housing: Low Risk  (10/24/2023)  Transportation Needs: No Transportation Needs (10/24/2023)  Utilities: Not At Risk (10/24/2023)  Alcohol Screen: Low Risk  (02/01/2023)  Depression (PHQ2-9): Low Risk  (05/17/2023)  Financial Resource Strain: Low Risk  (02/01/2023)  Physical Activity: Insufficiently Active (02/01/2023)  Social Connections: Moderately Isolated (02/01/2023)  Stress: No Stress Concern Present (02/01/2023)  Tobacco Use: Low Risk  (10/25/2023)    Readmission Risk Interventions    10/21/2023    1:46 PM  Readmission Risk Prevention Plan  Transportation Screening Complete  PCP or Specialist Appt within 3-5 Days Complete  HRI or Home Care Consult Complete  Social Work Consult for Recovery Care Planning/Counseling Complete  Palliative Care Screening Complete  Medication Review Oceanographer) Complete

## 2023-10-26 NOTE — [place] FL2 (Signed)
[place] MEDICAID FL2 LEVEL OF CARE FORM     IDENTIFICATION  Patient Name: Sarah Freeman Birthdate: Mar 26, 1931 Sex: female Admission Date (Current Location): 10/21/2023  [facility] and [place] Number:  Chiropodist and Address:  [facility], [address] [place], [place] [address]      Provider Number: [id number]  Attending Physician Name and Address:  Maretta Bees, MD  Relative Name and Phone Number:       Current Level of Care: Hospital Recommended Level of Care: Skilled Nursing Facility Prior Approval Number:    Date Approved/Denied:   PASRR Number:  [id number]   Discharge Plan: SNF    Current Diagnoses: Patient Active Problem List   Diagnosis Date Noted   Choledocholithiasis 10/25/2023   E coli bacteremia 10/22/2023   Calculus of gallbladder with biliary obstruction but without cholecystitis 10/22/2023   Septic shock (HCC) 10/21/2023   Elevated LFTs 10/21/2023   Hemiparesis affecting left side as late effect of cerebrovascular accident (CVA) (HCC) 10/21/2023   Fever 10/21/2023   Dilated cbd, acquired 10/21/2023   Pressure injury of skin 10/21/2023   Stroke-like symptoms 06/06/2023   Left hemiplegia (HCC) 05/19/2023   History of CVA with residual deficit 05/17/2023   Dyslipidemia 04/06/2023   Memory loss 12/29/2021   Aortic atherosclerosis (HCC) 02/27/2021   SVT (supraventricular tachycardia) (HCC) 10/11/2018   HTN (hypertension) 10/11/2018   Chronic kidney disease, stage 3 (HCC) 05/24/2018   Morbid obesity (HCC) 02/02/2018   Carotid stenosis 02/01/2018   Left arm weakness 01/18/2018   Urinary tract infection 11/04/2017   Statin intolerance 06/14/2017   Counseling regarding advanced directives and goals of care 02/22/2017   Weakness of left leg 02/25/2016   TIA (transient ischemic attack) 02/15/2016   Osteopenia 01/29/2016   Menopausal state 01/29/2016   Hypothyroidism 01/29/2016   GERD (gastroesophageal  reflux disease) 01/29/2016   Hypercholesterolemia 01/29/2016   Lumbar spinal stenosis 01/29/2016   Stress incontinence 01/29/2016   Breast microcalcification, mammographic 06/17/2015    Orientation RESPIRATION BLADDER Height & Weight     Self, Situation  Normal Incontinent, Indwelling catheter Weight: 151 lb 0.2 oz (68.5 kg) Height:     BEHAVIORAL SYMPTOMS/MOOD NEUROLOGICAL BOWEL NUTRITION STATUS      Incontinent Diet (See dc summary)  AMBULATORY STATUS COMMUNICATION OF NEEDS Skin   Extensive Assist Verbally PU Stage and Appropriate Care (Stage II on sacrum)                       Personal Care Assistance Level of Assistance  Bathing, Feeding, Dressing Bathing Assistance: Maximum assistance Feeding assistance: Maximum assistance Dressing Assistance: Maximum assistance     Functional Limitations Info  Hearing   Hearing Info: Impaired      SPECIAL CARE FACTORS FREQUENCY                       Contractures Contractures Info: Not present    Additional Factors Info  Code Status, Allergies, Psychotropic Code Status Info: DNR Allergies Info: Statins, Bacitracin, Neomycin-polymyxin-gramicidin Psychotropic Info: Seroquel         Current Medications (10/26/2023):  This is the current hospital active medication list Current Facility-Administered Medications  Medication Dose Route Frequency Provider Last Rate Last Admin   acetaminophen (TYLENOL) tablet 325 mg  325 mg Oral Q6H PRN Iva Boop, MD   325 mg at 10/24/23 2213   Ampicillin-Sulbactam (UNASYN) 3 g in sodium chloride 0.9 %  100 mL IVPB  3 g Intravenous Q8H Iva Boop, MD 200 mL/hr at 10/26/23 0909 3 g at 10/26/23 9678   Chlorhexidine Gluconate Cloth 2 % PADS 6 each  6 each Topical Q2000 Iva Boop, MD   6 each at 10/25/23 2048   cholecalciferol (VITAMIN D3) 25 MCG (1000 UNIT) tablet 1,000 Units  1,000 Units Oral Daily Iva Boop, MD   1,000 Units at 10/26/23 0912   cyanocobalamin (VITAMIN  B12) tablet 1,000 mcg  1,000 mcg Oral Daily Iva Boop, MD   1,000 mcg at 10/26/23 9381   docusate sodium (COLACE) capsule 100 mg  100 mg Oral BID PRN Iva Boop, MD       donepezil (ARICEPT) tablet 5 mg  5 mg Oral QHS Iva Boop, MD   5 mg at 10/24/23 2213   gabapentin (NEURONTIN) capsule 200 mg  200 mg Oral q morning Iva Boop, MD   200 mg at 10/26/23 0912   indomethacin (INDOCIN) 50 MG suppository 100 mg  100 mg Rectal Once Iva Boop, MD       levothyroxine (SYNTHROID) tablet 50 mcg  50 mcg Oral Q0600 Iva Boop, MD   50 mcg at 10/26/23 0604   lip balm (CARMEX) ointment   Topical PRN Iva Boop, MD       midodrine (PROAMATINE) tablet 2.5 mg  2.5 mg Oral BID WC Iva Boop, MD   2.5 mg at 10/26/23 0912   mirtazapine (REMERON) tablet 7.5 mg  7.5 mg Oral QHS Iva Boop, MD   7.5 mg at 10/24/23 2214   Oral care mouth rinse  15 mL Mouth Rinse 4 times per day Iva Boop, MD   15 mL at 10/26/23 0919   Oral care mouth rinse  15 mL Mouth Rinse PRN Iva Boop, MD       pantoprazole (PROTONIX) EC tablet 40 mg  40 mg Oral Daily Iva Boop, MD   40 mg at 10/26/23 0912   polyethylene glycol (MIRALAX / GLYCOLAX) packet 17 g  17 g Oral Daily PRN Iva Boop, MD       polyethylene glycol (MIRALAX / GLYCOLAX) packet 17 g  17 g Oral Daily Iva Boop, MD   17 g at 10/26/23 0912   QUEtiapine (SEROQUEL) tablet 25 mg  25 mg Oral QPM Iva Boop, MD   25 mg at 10/25/23 1805     Discharge Medications: Please see discharge summary for a list of discharge medications.  Relevant Imaging Results:  Relevant Lab Results:   Additional Information SS#: [id number]  Mearl Latin, LCSW

## 2023-10-26 NOTE — Progress Notes (Addendum)
Gastroenterology Inpatient Follow Up    Subjective: Patient states that she has mild abdominal pain but she is not tender on exam. Patient's son states that the patient has been trying to have BM, which is likely the source of her pain. She has not eaten anything yet but is ready to do so. Per patient's on, she would eat soft foods prior to this hospitalization.  Objective: Vital signs in last 24 hours: Temp:  [97.3 F (36.3 C)-97.5 F (36.4 C)] 97.3 F (36.3 C) (11/27 0400) Pulse Rate:  [79-101] 79 (11/27 0400) Resp:  [12-21] 14 (11/27 0400) BP: (94-140)/(64-83) 100/64 (11/27 0400) SpO2:  [93 %-100 %] 93 % (11/27 0400) Last BM Date : 10/26/23  Intake/Output from previous day: 11/26 0701 - 11/27 0700 In: 300 [I.V.:300] Out: 450 [Urine:450] Intake/Output this shift: No intake/output data recorded.  General appearance: alert and cooperative Resp: no increased WOB Cardio: regular rate GI: non-tender, non-distended  Lab Results: Recent Labs    10/24/23 0416 10/25/23 0358 10/26/23 0445  WBC 8.2 13.9* 12.8*  HGB 10.2* 10.9* 12.6  HCT 31.6* 34.1* 39.3  PLT 379 428* 459*   BMET Recent Labs    10/24/23 0416 10/25/23 0358 10/26/23 0445  NA 137 137 139  K 3.7 3.6 4.1  CL 109 107 108  CO2 22 21* 21*  GLUCOSE 79 132* 146*  BUN 7* 7* 8  CREATININE 0.94 0.84 0.88  CALCIUM 8.1* 8.4* 8.7*   LFT Recent Labs    10/26/23 0445  PROT 6.1*  ALBUMIN 2.3*  AST 27  ALT 38  ALKPHOS 137*  BILITOT 0.6   PT/INR No results for input(s): "LABPROT", "INR" in the last 72 hours. Hepatitis Panel No results for input(s): "HEPBSAG", "HCVAB", "HEPAIGM", "HEPBIGM" in the last 72 hours. C-Diff No results for input(s): "CDIFFTOX" in the last 72 hours.  Studies/Results: DG ERCP  Result Date: 10/26/2023 CLINICAL DATA:  Choledocholithiasis EXAM: ERCP TECHNIQUE: Multiple spot images obtained with the fluoroscopic device and submitted for interpretation post-procedure.  FLUOROSCOPY: Radiation Exposure Index (as provided by the fluoroscopic device): 19 mGy Kerma COMPARISON:  MRI abdomen 10/21/2023 FINDINGS: 8 intraoperative fluoroscopic images were submitted for interpretation. The submitted images demonstrate cannulation and opacification of the intra or extrahepatic bile duct. Dilatation of the common bile duct is again noted. Image 4 demonstrates a balloon within the CBD. No filling defects identified within the superior half of the common bile duct on the final image. There is incomplete opacification of the inferior half of the common bile duct. IMPRESSION: ERCP images demonstrating balloon sweep of dilated CBD. These images were submitted for radiologic interpretation only. Please see the procedural report for the amount of contrast and the fluoroscopy time utilized. Electronically Signed   By: Acquanetta Belling M.D.   On: 10/26/2023 08:14    Medications: I have reviewed the patient's current medications. Scheduled:  Chlorhexidine Gluconate Cloth  6 each Topical Q2000   cholecalciferol  1,000 Units Oral Daily   cyanocobalamin  1,000 mcg Oral Daily   donepezil  5 mg Oral QHS   gabapentin  200 mg Oral q morning   indomethacin  100 mg Rectal Once   levothyroxine  50 mcg Oral Q0600   midodrine  2.5 mg Oral BID WC   mirtazapine  7.5 mg Oral QHS   mouth rinse  15 mL Mouth Rinse 4 times per day   pantoprazole  40 mg Oral Daily   polyethylene glycol  17 g Oral Daily  QUEtiapine  25 mg Oral QPM   Continuous:  ampicillin-sulbactam (UNASYN) IV 3 g (10/26/23 0909)   OVF:IEPPIRJJOACZY, docusate sodium, lip balm, mouth rinse, polyethylene glycol  Assessment/Plan: 89 year old female presented with ascending cholangitis and UTI with E. coli bacteremia.  Patient has responded to antibiotic therapy.  MRCP shows mild intrahepatic biliary ductal dilation and dilated CBD at 1.8 cm.  Some subtle irregularity/thickening along the posterior wall of the CBD does raise concern for  debris or gallstones.  ERCP 11/26 with CBD stones removed with biliary sphincterotomy and balloon sweeping. LFTs are continuing to downtrend. Patient is feeling well. No signs of post-ERCP complications - Advanced to soft diet - Okay to restart Plavix on 11/30 - Will defer antibiotics to hospitalist team since patient also has UTI - Okay to discharge from GI perspective if patient is able to tolerate diet   LOS: 5 days   Imogene Burn 10/26/2023, 10:04 AM

## 2023-10-27 DIAGNOSIS — K8050 Calculus of bile duct without cholangitis or cholecystitis without obstruction: Secondary | ICD-10-CM

## 2023-10-27 DIAGNOSIS — K8021 Calculus of gallbladder without cholecystitis with obstruction: Secondary | ICD-10-CM | POA: Diagnosis not present

## 2023-10-27 DIAGNOSIS — A419 Sepsis, unspecified organism: Secondary | ICD-10-CM | POA: Diagnosis not present

## 2023-10-27 DIAGNOSIS — R6521 Severe sepsis with septic shock: Secondary | ICD-10-CM | POA: Diagnosis not present

## 2023-10-27 LAB — GLUCOSE, CAPILLARY
Glucose-Capillary: 84 mg/dL (ref 70–99)
Glucose-Capillary: 64 mg/dL — ABNORMAL LOW (ref 70–99)

## 2023-10-27 NOTE — Plan of Care (Signed)
  Problem: Education: Goal: Knowledge of General Education information will improve Description: Including pain rating scale, medication(s)/side effects and non-pharmacologic comfort measures Outcome: Completed/Met   Problem: Nutrition: Goal: Adequate nutrition will be maintained Outcome: Completed/Met   Problem: Coping: Goal: Level of anxiety will decrease Outcome: Completed/Met   Problem: Safety: Goal: Ability to remain free from injury will improve Outcome: Completed/Met   Problem: Skin Integrity: Goal: Risk for impaired skin integrity will decrease Outcome: Completed/Met

## 2023-10-27 NOTE — Progress Notes (Signed)
Seen and examined No major issues overnight Discussed with nursing staff-Foley was removed yesterday-she finally voided overnight Stable for discharge to SNF-discharge summary updated.

## 2023-10-27 NOTE — Plan of Care (Signed)
Problem: Nutrition: Goal: Adequate nutrition will be maintained Outcome: Progressing   Problem: Coping: Goal: Level of anxiety will decrease Outcome: Progressing   Problem: Skin Integrity: Goal: Risk for impaired skin integrity will decrease Outcome: Progressing

## 2023-10-27 NOTE — TOC Transition Note (Signed)
Transition of Care [facility]) - CM/SW Discharge Note   Patient Details  Name: Sarah Freeman MRN: [id number] Date of Birth: 09/29/1931  [facility]) CM/SW Contact:  Eduard Roux, LCSW Phone Number: 10/27/2023, 10:14 AM   Clinical Narrative:     Patient will Discharge [name]  Discharge Date: 10/27/2023 Family Notified: left voice message for daughter,Julie [facility]:GNFA  Per MD patient is ready for discharge. RN, patient, and facility notified of discharge. Discharge Summary sent to facility. RN given number for [phone]. Ambulance transport requested for patient.   Clinical Social Worker signing off.  Antony Blackbird, MSW, LCSW Clinical Social Worker     Final next level of care: Skilled Nursing Facility Barriers to Discharge: Barriers Resolved   Patient Goals and CMS Choice      Discharge Placement     Existing PASRR number confirmed : 10/26/23          Patient chooses bed at: [facility] Patient to be transferred to facility by: [facility] Name of family member notified: Daughter Patient and family notified of of transfer: 10/26/23  Discharge Plan and Services Additional resources added to the After Visit Summary for   In-house Referral: Clinical Social Work                                   Social Determinants of Health (SDOH) Interventions SDOH Screenings   Food Insecurity: No Food Insecurity (10/24/2023)  Housing: Low Risk  (10/24/2023)  Transportation Needs: No Transportation Needs (10/24/2023)  Utilities: Not At Risk (10/24/2023)  Alcohol Screen: Low Risk  (02/01/2023)  Depression (PHQ2-9): Low Risk  (05/17/2023)  Financial Resource Strain: Low Risk  (02/01/2023)  Physical Activity: Insufficiently Active (02/01/2023)  Social Connections: Moderately Isolated (02/01/2023)  Stress: No Stress Concern Present (02/01/2023)  Tobacco Use: Low Risk  (10/25/2023)     Readmission Risk Interventions     10/21/2023    1:46 PM  Readmission Risk Prevention Plan  Transportation Screening Complete  PCP or Specialist Appt within 3-5 Days Complete  HRI or Home Care Consult Complete  Social Work Consult for Recovery Care Planning/Counseling Complete  Palliative Care Screening Complete  Medication Review Oceanographer) Complete

## 2023-10-28 ENCOUNTER — Encounter (HOSPITAL_COMMUNITY): Payer: Self-pay | Admitting: Internal Medicine

## 2023-10-28 LAB — CULTURE, BLOOD (ROUTINE X 2)
Culture: NO GROWTH
Culture: NO GROWTH
Special Requests: ADEQUATE

## 2023-11-07 ENCOUNTER — Ambulatory Visit: Payer: Medicare Other | Admitting: Urology

## 2024-06-29 DEATH — deceased
